# Patient Record
Sex: Female | Born: 1950 | Race: Black or African American | Hispanic: No | Marital: Single | Smoking: Never smoker
Health system: Southern US, Community
[De-identification: ages and names within clinical notes are randomized; demographics above are authoritative.]

## PROBLEM LIST (undated history)

## (undated) DIAGNOSIS — Z8679 Personal history of other diseases of the circulatory system: Secondary | ICD-10-CM

## (undated) DIAGNOSIS — F329 Major depressive disorder, single episode, unspecified: Secondary | ICD-10-CM

## (undated) DIAGNOSIS — M109 Gout, unspecified: Secondary | ICD-10-CM

## (undated) DIAGNOSIS — D121 Benign neoplasm of appendix: Secondary | ICD-10-CM

## (undated) DIAGNOSIS — I10 Essential (primary) hypertension: Secondary | ICD-10-CM

## (undated) DIAGNOSIS — L299 Pruritus, unspecified: Secondary | ICD-10-CM

## (undated) DIAGNOSIS — T7840XA Allergy, unspecified, initial encounter: Secondary | ICD-10-CM

## (undated) DIAGNOSIS — K633 Ulcer of intestine: Secondary | ICD-10-CM

## (undated) DIAGNOSIS — K2270 Barrett's esophagus without dysplasia: Secondary | ICD-10-CM

## (undated) DIAGNOSIS — F32A Depression, unspecified: Secondary | ICD-10-CM

## (undated) DIAGNOSIS — E119 Type 2 diabetes mellitus without complications: Secondary | ICD-10-CM

## (undated) DIAGNOSIS — F411 Generalized anxiety disorder: Secondary | ICD-10-CM

## (undated) DIAGNOSIS — M1990 Unspecified osteoarthritis, unspecified site: Secondary | ICD-10-CM

## (undated) DIAGNOSIS — F489 Nonpsychotic mental disorder, unspecified: Secondary | ICD-10-CM

## (undated) DIAGNOSIS — R519 Headache, unspecified: Secondary | ICD-10-CM

## (undated) DIAGNOSIS — Z5189 Encounter for other specified aftercare: Secondary | ICD-10-CM

## (undated) DIAGNOSIS — K589 Irritable bowel syndrome without diarrhea: Secondary | ICD-10-CM

## (undated) DIAGNOSIS — K219 Gastro-esophageal reflux disease without esophagitis: Secondary | ICD-10-CM

## (undated) DIAGNOSIS — F988 Other specified behavioral and emotional disorders with onset usually occurring in childhood and adolescence: Secondary | ICD-10-CM

## (undated) DIAGNOSIS — IMO0001 Reserved for inherently not codable concepts without codable children: Secondary | ICD-10-CM

## (undated) DIAGNOSIS — K5730 Diverticulosis of large intestine without perforation or abscess without bleeding: Secondary | ICD-10-CM

## (undated) DIAGNOSIS — M654 Radial styloid tenosynovitis [de Quervain]: Secondary | ICD-10-CM

## (undated) DIAGNOSIS — J309 Allergic rhinitis, unspecified: Secondary | ICD-10-CM

## (undated) DIAGNOSIS — M797 Fibromyalgia: Secondary | ICD-10-CM

## (undated) DIAGNOSIS — G894 Chronic pain syndrome: Secondary | ICD-10-CM

## (undated) DIAGNOSIS — K449 Diaphragmatic hernia without obstruction or gangrene: Secondary | ICD-10-CM

## (undated) DIAGNOSIS — E7800 Pure hypercholesterolemia, unspecified: Secondary | ICD-10-CM

## (undated) HISTORY — PX: ANKLE SURGERY: SHX546

## (undated) HISTORY — PX: CARPAL TUNNEL RELEASE: SHX101

## (undated) HISTORY — DX: Pure hypercholesterolemia, unspecified: E78.00

## (undated) HISTORY — DX: Fibromyalgia: M79.7

## (undated) HISTORY — DX: Essential (primary) hypertension: I10

## (undated) HISTORY — DX: Personal history of other diseases of the circulatory system: Z86.79

## (undated) HISTORY — PX: ESOPHAGOGASTRODUODENOSCOPY: SHX1529

## (undated) HISTORY — DX: Chronic pain syndrome: G89.4

## (undated) HISTORY — DX: Depression, unspecified: F32.A

## (undated) HISTORY — DX: Nonpsychotic mental disorder, unspecified: F48.9

## (undated) HISTORY — DX: Diaphragmatic hernia without obstruction or gangrene: K44.9

## (undated) HISTORY — DX: Unspecified osteoarthritis, unspecified site: M19.90

## (undated) HISTORY — DX: Reserved for inherently not codable concepts without codable children: IMO0001

## (undated) HISTORY — DX: Benign neoplasm of appendix: D12.1

## (undated) HISTORY — PX: SALPINGOOPHORECTOMY: SHX82

## (undated) HISTORY — DX: Radial styloid tenosynovitis (de quervain): M65.4

## (undated) HISTORY — DX: Generalized anxiety disorder: F41.1

## (undated) HISTORY — DX: Major depressive disorder, single episode, unspecified: F32.9

## (undated) HISTORY — DX: Gastro-esophageal reflux disease without esophagitis: K21.9

## (undated) HISTORY — PX: CERVICAL SPINE SURGERY: SHX589

## (undated) HISTORY — PX: ROTATOR CUFF REPAIR: SHX139

## (undated) HISTORY — DX: Allergy, unspecified, initial encounter: T78.40XA

## (undated) HISTORY — DX: Other specified behavioral and emotional disorders with onset usually occurring in childhood and adolescence: F98.8

## (undated) HISTORY — DX: Gout, unspecified: M10.9

## (undated) HISTORY — DX: Allergic rhinitis, unspecified: J30.9

## (undated) HISTORY — DX: Headache, unspecified: R51.9

## (undated) HISTORY — DX: Diverticulosis of large intestine without perforation or abscess without bleeding: K57.30

## (undated) HISTORY — DX: Barrett's esophagus without dysplasia: K22.70

## (undated) HISTORY — DX: Irritable bowel syndrome, unspecified: K58.9

## (undated) HISTORY — PX: TOTAL SHOULDER ARTHROPLASTY: SHX126

## (undated) HISTORY — DX: Ulcer of intestine: K63.3

## (undated) HISTORY — DX: Pruritus, unspecified: L29.9

## (undated) HISTORY — DX: Type 2 diabetes mellitus without complications: E11.9

## (undated) HISTORY — PX: APPENDECTOMY: SHX54

## (undated) HISTORY — PX: COLONOSCOPY: SHX174

## (undated) HISTORY — DX: Encounter for other specified aftercare: Z51.89

## (undated) HISTORY — PX: UPPER GASTROINTESTINAL ENDOSCOPY: SHX188

---

## 1997-07-28 ENCOUNTER — Ambulatory Visit (HOSPITAL_COMMUNITY): Admission: RE | Admit: 1997-07-28 | Discharge: 1997-07-28 | Payer: Self-pay | Admitting: Obstetrics and Gynecology

## 1998-02-10 ENCOUNTER — Ambulatory Visit (HOSPITAL_COMMUNITY): Admission: RE | Admit: 1998-02-10 | Discharge: 1998-02-10 | Payer: Self-pay | Admitting: Pulmonary Disease

## 1998-02-10 ENCOUNTER — Encounter: Payer: Self-pay | Admitting: Pulmonary Disease

## 1998-08-12 ENCOUNTER — Other Ambulatory Visit: Admission: RE | Admit: 1998-08-12 | Discharge: 1998-08-12 | Payer: Self-pay | Admitting: Obstetrics and Gynecology

## 1998-12-15 ENCOUNTER — Ambulatory Visit (HOSPITAL_COMMUNITY): Admission: RE | Admit: 1998-12-15 | Discharge: 1998-12-15 | Payer: Self-pay | Admitting: Obstetrics and Gynecology

## 1998-12-15 ENCOUNTER — Encounter: Payer: Self-pay | Admitting: Obstetrics and Gynecology

## 1998-12-21 ENCOUNTER — Encounter: Admission: RE | Admit: 1998-12-21 | Discharge: 1998-12-21 | Payer: Self-pay | Admitting: Specialist

## 1998-12-21 ENCOUNTER — Encounter: Payer: Self-pay | Admitting: Specialist

## 1999-11-22 ENCOUNTER — Other Ambulatory Visit: Admission: RE | Admit: 1999-11-22 | Discharge: 1999-11-22 | Payer: Self-pay | Admitting: Obstetrics and Gynecology

## 1999-12-17 ENCOUNTER — Encounter: Payer: Self-pay | Admitting: Obstetrics and Gynecology

## 1999-12-17 ENCOUNTER — Ambulatory Visit (HOSPITAL_COMMUNITY): Admission: RE | Admit: 1999-12-17 | Discharge: 1999-12-17 | Payer: Self-pay | Admitting: Obstetrics and Gynecology

## 2000-03-13 ENCOUNTER — Encounter: Payer: Self-pay | Admitting: Pulmonary Disease

## 2000-03-13 ENCOUNTER — Ambulatory Visit (HOSPITAL_COMMUNITY): Admission: RE | Admit: 2000-03-13 | Discharge: 2000-03-13 | Payer: Self-pay | Admitting: Pulmonary Disease

## 2000-06-08 ENCOUNTER — Encounter: Payer: Self-pay | Admitting: Specialist

## 2000-06-08 ENCOUNTER — Encounter: Admission: RE | Admit: 2000-06-08 | Discharge: 2000-06-08 | Payer: Self-pay | Admitting: Specialist

## 2000-12-08 ENCOUNTER — Other Ambulatory Visit: Admission: RE | Admit: 2000-12-08 | Discharge: 2000-12-08 | Payer: Self-pay | Admitting: Obstetrics and Gynecology

## 2000-12-19 ENCOUNTER — Ambulatory Visit (HOSPITAL_COMMUNITY): Admission: RE | Admit: 2000-12-19 | Discharge: 2000-12-19 | Payer: Self-pay | Admitting: Obstetrics and Gynecology

## 2000-12-19 ENCOUNTER — Encounter: Payer: Self-pay | Admitting: Obstetrics and Gynecology

## 2000-12-27 ENCOUNTER — Ambulatory Visit (HOSPITAL_COMMUNITY): Admission: RE | Admit: 2000-12-27 | Discharge: 2000-12-27 | Payer: Self-pay | Admitting: Pulmonary Disease

## 2000-12-27 ENCOUNTER — Encounter: Payer: Self-pay | Admitting: Pulmonary Disease

## 2001-01-09 ENCOUNTER — Encounter (INDEPENDENT_AMBULATORY_CARE_PROVIDER_SITE_OTHER): Payer: Self-pay | Admitting: Gastroenterology

## 2001-01-09 DIAGNOSIS — K5730 Diverticulosis of large intestine without perforation or abscess without bleeding: Secondary | ICD-10-CM

## 2001-01-11 ENCOUNTER — Encounter: Payer: Self-pay | Admitting: Gastroenterology

## 2001-01-11 ENCOUNTER — Ambulatory Visit (HOSPITAL_COMMUNITY): Admission: RE | Admit: 2001-01-11 | Discharge: 2001-01-11 | Payer: Self-pay | Admitting: Gastroenterology

## 2001-07-11 ENCOUNTER — Ambulatory Visit (HOSPITAL_COMMUNITY): Admission: RE | Admit: 2001-07-11 | Discharge: 2001-07-11 | Payer: Self-pay | Admitting: Gastroenterology

## 2001-07-11 ENCOUNTER — Encounter: Payer: Self-pay | Admitting: Gastroenterology

## 2002-01-02 ENCOUNTER — Other Ambulatory Visit: Admission: RE | Admit: 2002-01-02 | Discharge: 2002-01-02 | Payer: Self-pay | Admitting: Obstetrics and Gynecology

## 2002-01-13 ENCOUNTER — Encounter: Payer: Self-pay | Admitting: Orthopedic Surgery

## 2002-01-13 ENCOUNTER — Ambulatory Visit (HOSPITAL_COMMUNITY): Admission: RE | Admit: 2002-01-13 | Discharge: 2002-01-13 | Payer: Self-pay | Admitting: Orthopedic Surgery

## 2002-10-29 ENCOUNTER — Encounter: Payer: Self-pay | Admitting: Gastroenterology

## 2002-10-29 ENCOUNTER — Ambulatory Visit (HOSPITAL_COMMUNITY): Admission: RE | Admit: 2002-10-29 | Discharge: 2002-10-29 | Payer: Self-pay | Admitting: Gastroenterology

## 2002-11-01 ENCOUNTER — Ambulatory Visit (HOSPITAL_COMMUNITY): Admission: RE | Admit: 2002-11-01 | Discharge: 2002-11-01 | Payer: Self-pay | Admitting: Gastroenterology

## 2002-11-01 ENCOUNTER — Encounter: Payer: Self-pay | Admitting: Gastroenterology

## 2003-01-21 ENCOUNTER — Other Ambulatory Visit: Admission: RE | Admit: 2003-01-21 | Discharge: 2003-01-21 | Payer: Self-pay | Admitting: Obstetrics and Gynecology

## 2004-01-26 ENCOUNTER — Other Ambulatory Visit: Admission: RE | Admit: 2004-01-26 | Discharge: 2004-01-26 | Payer: Self-pay | Admitting: Obstetrics and Gynecology

## 2004-02-04 ENCOUNTER — Ambulatory Visit: Payer: Self-pay | Admitting: Pulmonary Disease

## 2004-03-04 ENCOUNTER — Ambulatory Visit: Payer: Self-pay | Admitting: Pulmonary Disease

## 2004-03-30 ENCOUNTER — Ambulatory Visit: Payer: Self-pay | Admitting: Pulmonary Disease

## 2005-03-07 ENCOUNTER — Ambulatory Visit: Payer: Self-pay | Admitting: Pulmonary Disease

## 2005-07-08 ENCOUNTER — Ambulatory Visit: Payer: Self-pay | Admitting: Pulmonary Disease

## 2006-01-13 ENCOUNTER — Other Ambulatory Visit: Admission: RE | Admit: 2006-01-13 | Discharge: 2006-01-13 | Payer: Self-pay | Admitting: Obstetrics and Gynecology

## 2006-01-24 ENCOUNTER — Ambulatory Visit: Payer: Self-pay | Admitting: Pulmonary Disease

## 2006-09-18 ENCOUNTER — Ambulatory Visit: Payer: Self-pay | Admitting: Pulmonary Disease

## 2006-10-26 ENCOUNTER — Ambulatory Visit: Payer: Self-pay | Admitting: Internal Medicine

## 2006-11-09 ENCOUNTER — Encounter: Payer: Self-pay | Admitting: Internal Medicine

## 2006-11-09 ENCOUNTER — Ambulatory Visit: Payer: Self-pay | Admitting: Internal Medicine

## 2007-01-10 ENCOUNTER — Ambulatory Visit: Payer: Self-pay | Admitting: Internal Medicine

## 2007-03-08 ENCOUNTER — Encounter: Payer: Self-pay | Admitting: Pulmonary Disease

## 2007-04-20 DIAGNOSIS — K589 Irritable bowel syndrome without diarrhea: Secondary | ICD-10-CM

## 2007-04-20 DIAGNOSIS — I10 Essential (primary) hypertension: Secondary | ICD-10-CM | POA: Insufficient documentation

## 2007-04-20 DIAGNOSIS — M19041 Primary osteoarthritis, right hand: Secondary | ICD-10-CM

## 2007-04-20 DIAGNOSIS — M654 Radial styloid tenosynovitis [de Quervain]: Secondary | ICD-10-CM

## 2007-04-20 DIAGNOSIS — M797 Fibromyalgia: Secondary | ICD-10-CM

## 2007-04-20 DIAGNOSIS — M19042 Primary osteoarthritis, left hand: Secondary | ICD-10-CM

## 2007-04-20 DIAGNOSIS — F988 Other specified behavioral and emotional disorders with onset usually occurring in childhood and adolescence: Secondary | ICD-10-CM | POA: Insufficient documentation

## 2007-04-20 DIAGNOSIS — F411 Generalized anxiety disorder: Secondary | ICD-10-CM | POA: Insufficient documentation

## 2007-04-20 DIAGNOSIS — K219 Gastro-esophageal reflux disease without esophagitis: Secondary | ICD-10-CM

## 2007-07-19 ENCOUNTER — Encounter: Payer: Self-pay | Admitting: Pulmonary Disease

## 2007-10-03 ENCOUNTER — Encounter: Payer: Self-pay | Admitting: Pulmonary Disease

## 2007-10-03 DIAGNOSIS — E7800 Pure hypercholesterolemia, unspecified: Secondary | ICD-10-CM

## 2007-10-03 DIAGNOSIS — Z8679 Personal history of other diseases of the circulatory system: Secondary | ICD-10-CM | POA: Insufficient documentation

## 2007-10-03 DIAGNOSIS — E785 Hyperlipidemia, unspecified: Secondary | ICD-10-CM | POA: Insufficient documentation

## 2007-10-09 ENCOUNTER — Telehealth (INDEPENDENT_AMBULATORY_CARE_PROVIDER_SITE_OTHER): Payer: Self-pay | Admitting: *Deleted

## 2007-10-18 ENCOUNTER — Ambulatory Visit: Payer: Self-pay | Admitting: Pulmonary Disease

## 2007-10-19 ENCOUNTER — Telehealth (INDEPENDENT_AMBULATORY_CARE_PROVIDER_SITE_OTHER): Payer: Self-pay | Admitting: *Deleted

## 2007-10-19 ENCOUNTER — Encounter: Payer: Self-pay | Admitting: Pulmonary Disease

## 2007-10-20 DIAGNOSIS — J309 Allergic rhinitis, unspecified: Secondary | ICD-10-CM | POA: Insufficient documentation

## 2007-10-20 DIAGNOSIS — G894 Chronic pain syndrome: Secondary | ICD-10-CM

## 2007-10-20 DIAGNOSIS — F329 Major depressive disorder, single episode, unspecified: Secondary | ICD-10-CM | POA: Insufficient documentation

## 2007-11-22 ENCOUNTER — Encounter: Payer: Self-pay | Admitting: Pulmonary Disease

## 2007-11-26 ENCOUNTER — Ambulatory Visit: Payer: Self-pay | Admitting: Internal Medicine

## 2007-11-29 ENCOUNTER — Encounter: Payer: Self-pay | Admitting: Internal Medicine

## 2007-11-29 ENCOUNTER — Ambulatory Visit: Payer: Self-pay | Admitting: Internal Medicine

## 2007-12-26 ENCOUNTER — Encounter: Payer: Self-pay | Admitting: Pulmonary Disease

## 2008-01-08 ENCOUNTER — Telehealth (INDEPENDENT_AMBULATORY_CARE_PROVIDER_SITE_OTHER): Payer: Self-pay | Admitting: *Deleted

## 2008-04-15 ENCOUNTER — Ambulatory Visit: Payer: Self-pay | Admitting: Pulmonary Disease

## 2008-04-17 ENCOUNTER — Ambulatory Visit: Payer: Self-pay | Admitting: Pulmonary Disease

## 2008-04-20 LAB — CONVERTED CEMR LAB
ALT: 37 units/L — ABNORMAL HIGH (ref 0–35)
AST: 25 units/L (ref 0–37)
Albumin: 4.1 g/dL (ref 3.5–5.2)
Alkaline Phosphatase: 84 units/L (ref 39–117)
BUN: 14 mg/dL (ref 6–23)
Basophils Absolute: 0 10*3/uL (ref 0.0–0.1)
Basophils Relative: 0.6 % (ref 0.0–3.0)
Bilirubin, Direct: 0.1 mg/dL (ref 0.0–0.3)
CO2: 30 meq/L (ref 19–32)
Calcium: 9.6 mg/dL (ref 8.4–10.5)
Chloride: 104 meq/L (ref 96–112)
Cholesterol: 221 mg/dL (ref 0–200)
Creatinine, Ser: 0.8 mg/dL (ref 0.4–1.2)
Direct LDL: 130.1 mg/dL
Eosinophils Absolute: 0.2 10*3/uL (ref 0.0–0.7)
Eosinophils Relative: 2.5 % (ref 0.0–5.0)
GFR calc Af Amer: 94 mL/min
GFR calc non Af Amer: 78 mL/min
Glucose, Bld: 118 mg/dL — ABNORMAL HIGH (ref 70–99)
HCT: 37.7 % (ref 36.0–46.0)
HDL: 37.2 mg/dL — ABNORMAL LOW (ref >39.0)
Hemoglobin: 12.9 g/dL (ref 12.0–15.0)
Lymphocytes Relative: 38.3 % (ref 12.0–46.0)
MCHC: 34.2 g/dL (ref 30.0–36.0)
MCV: 92.1 fL (ref 78.0–100.0)
Monocytes Absolute: 0.4 10*3/uL (ref 0.1–1.0)
Monocytes Relative: 6.3 % (ref 3.0–12.0)
Neutro Abs: 3.2 10*3/uL (ref 1.4–7.7)
Neutrophils Relative %: 52.3 % (ref 43.0–77.0)
Platelets: 270 10*3/uL (ref 150–400)
Potassium: 3.6 meq/L (ref 3.5–5.1)
RBC: 4.09 M/uL (ref 3.87–5.11)
RDW: 13.5 % (ref 11.5–14.6)
Sed Rate: 19 mm/hr (ref 0–22)
Sodium: 142 meq/L (ref 135–145)
TSH: 3.26 microintl units/mL (ref 0.35–5.50)
Total Bilirubin: 0.7 mg/dL (ref 0.3–1.2)
Total CHOL/HDL Ratio: 5.9
Total Protein: 7.2 g/dL (ref 6.0–8.3)
Triglycerides: 194 mg/dL — ABNORMAL HIGH (ref 0–149)
VLDL: 39 mg/dL (ref 0–40)
WBC: 6.2 10*3/uL (ref 4.5–10.5)

## 2008-04-21 LAB — CONVERTED CEMR LAB: Vit D, 25-Hydroxy: 17 ng/mL — ABNORMAL LOW (ref 30–89)

## 2008-05-29 ENCOUNTER — Telehealth (INDEPENDENT_AMBULATORY_CARE_PROVIDER_SITE_OTHER): Payer: Self-pay | Admitting: *Deleted

## 2008-05-29 ENCOUNTER — Encounter: Payer: Self-pay | Admitting: Pulmonary Disease

## 2008-10-16 ENCOUNTER — Encounter: Payer: Self-pay | Admitting: Pulmonary Disease

## 2008-12-30 ENCOUNTER — Encounter: Payer: Self-pay | Admitting: Pulmonary Disease

## 2009-01-06 ENCOUNTER — Other Ambulatory Visit: Admission: RE | Admit: 2009-01-06 | Discharge: 2009-01-06 | Payer: Self-pay | Admitting: Obstetrics and Gynecology

## 2009-01-06 ENCOUNTER — Ambulatory Visit: Payer: Self-pay | Admitting: Obstetrics and Gynecology

## 2009-01-06 ENCOUNTER — Encounter: Payer: Self-pay | Admitting: Obstetrics and Gynecology

## 2009-01-08 ENCOUNTER — Ambulatory Visit: Payer: Self-pay | Admitting: Pulmonary Disease

## 2009-01-15 ENCOUNTER — Encounter: Admission: RE | Admit: 2009-01-15 | Discharge: 2009-01-15 | Payer: Self-pay | Admitting: Obstetrics and Gynecology

## 2009-01-23 ENCOUNTER — Telehealth (INDEPENDENT_AMBULATORY_CARE_PROVIDER_SITE_OTHER): Payer: Self-pay | Admitting: *Deleted

## 2009-04-23 ENCOUNTER — Encounter: Payer: Self-pay | Admitting: Pulmonary Disease

## 2009-07-08 ENCOUNTER — Ambulatory Visit: Payer: Self-pay | Admitting: Pulmonary Disease

## 2009-07-09 ENCOUNTER — Ambulatory Visit: Payer: Self-pay | Admitting: Pulmonary Disease

## 2009-07-09 LAB — CONVERTED CEMR LAB
AST: 31 units/L (ref 0–37)
Albumin: 4 g/dL (ref 3.5–5.2)
Alkaline Phosphatase: 72 units/L (ref 39–117)
BUN: 13 mg/dL (ref 6–23)
Basophils Relative: 0.4 % (ref 0.0–3.0)
Chloride: 104 meq/L (ref 96–112)
Cholesterol: 220 mg/dL — ABNORMAL HIGH (ref 0–200)
Eosinophils Absolute: 0.1 10*3/uL (ref 0.0–0.7)
GFR calc non Af Amer: 96.61 mL/min (ref >60)
Lymphs Abs: 1.8 10*3/uL (ref 0.7–4.0)
MCHC: 33.2 g/dL (ref 30.0–36.0)
Monocytes Absolute: 0.3 10*3/uL (ref 0.1–1.0)
Neutro Abs: 3.6 10*3/uL (ref 1.4–7.7)
Neutrophils Relative %: 61.3 % (ref 43.0–77.0)
Potassium: 4.4 meq/L (ref 3.5–5.1)
Total Protein: 7 g/dL (ref 6.0–8.3)
Triglycerides: 192 mg/dL — ABNORMAL HIGH (ref 0.0–149.0)
VLDL: 38.4 mg/dL (ref 0.0–40.0)
WBC: 5.8 10*3/uL (ref 4.5–10.5)

## 2009-09-22 ENCOUNTER — Telehealth: Payer: Self-pay | Admitting: Pulmonary Disease

## 2009-10-28 ENCOUNTER — Telehealth: Payer: Self-pay | Admitting: Pulmonary Disease

## 2010-01-06 ENCOUNTER — Ambulatory Visit: Payer: Self-pay | Admitting: Pulmonary Disease

## 2010-01-18 ENCOUNTER — Ambulatory Visit (HOSPITAL_COMMUNITY): Admission: RE | Admit: 2010-01-18 | Discharge: 2010-01-18 | Payer: Self-pay | Admitting: Obstetrics and Gynecology

## 2010-03-30 NOTE — Assessment & Plan Note (Signed)
Summary: 6 month return/mhh   Primary Care Provider:  Lorin Picket Nadel,MD  CC:  6 month ROV & review of mult medical problems....  History of Present Illness: 60 y/o BF here for a follow up visit... she has multiple medical problems as noted below...    ~  we have prev filled out disability papers at her request-  she is disabled due to multiple medical problems and DJD/ Fibromyalgia/ Chronic Pain syndrome/ and Psychiatric issues... these problems have been evaluated and are followed by mult other practitioners including: DrDeveshwar (Rheum), DrNitka (Ortho), DrHirsh (Neurosurg), and [facility] ([facility])...   ~  Jul 09, 2009:  Stable overall>  saw DrDeveshwar 2/11 for f/u FM, generalized pain, hx gout, right knee problems> on Allopurinol, Colcrys, Vicodin, Robaxin, Neurontin (they signed narcotic contract)... she also f/u w/ Psychiatry on Wellbutrin, Cymbalta, Seroquel... BP controlled as below;  Chol is fair but she doesn't want statin Rx;  GI stable w/ acid reflux & IBS... recent labs reviewed w/ pt.   ~  January 06, 2010:  she's had a reasonably good 22mo> but continues w/ her chronic problems including aching/ sore from the [facility] etc & she continues to f/u w/ DrDeveshwar for Rheum- meds unchanged as below... she also maintains regular f/u w/ LisaPoulas/ Psychiatry- maintained on the same 3 meds as noted... from the medical standpoint> BP controlled, remains on diet for chol etc, & GI system is stable on her Prn meds as listed... OK Flu vaccine today> we discussed continue current regimen & f/u OV w/ fasting labs in 6mo.    Current Problem List:  ALLERGIC RHINITIS (ICD-477.9) - she uses OTC antihistamines, mucinex, saline and FLONASE for her sinuses...  HYPERTENSION (ICD-401.9) - controlled on TOPROL XL 100/d,  AMLODIPINE 10mg /d,  DIOVAN 320mg /d... BP= 116/82 and feeling OK, tolerating meds well... denies HA, visual changes, CP, palipit, syncope, dyspnea, edema, etc...  MITRAL  VALVE PROLAPSE, HX OF (ICD-V12.50) - notes some intermittent sharp CP & palpit...  HYPERCHOLESTEROLEMIA (ICD-272.0) - on diet + OMEGA-3 Fish Oil... she has not been interested in statin Rx...  ~  FLP 10/06 by her GYN showed TChol 254, TG 364, HDL 38, LDL 143  ~  FLP 2/10 showed TChol 221, TG 194, HDL 37, LDL 130... rec> better diet, she refuses statin meds.  ~  FLP 5/11 showed TChol 220, TG 192, HDL 40, LDL 133  GERD (ICD-530.81) & ? of BARRETTS ESOPHAGUS (ICD-530.85) - long hx of GERD symptoms and followed by DrGessner for GI... she takes OMEPRAZOLE 20mg /d + GasX etc... last EGD 9/08 showed sm HH and ?1cm segment of Barrett's but bx was neg... therefore routine f/u suggested...   DIVERTICULOSIS, COLON (ICD-562.10) & IRRITABLE BOWEL SYNDROME (ICD-564.1) - she takes DICYCLOMINE 20mg Tid from [employer]...   ~  colonoscopy 11/02 showed mild divertics, otherw neg...   ~  10/09:  f/u GI eval by DrGessner due to symptoms of pain, diarrhea, bloating> colonoscopy was normal & bx= neg, therefore felt to all be c/w IBS...  OSTEOARTHRITIS (ICD-715.90) GOUT (ICD-274.9) FIBROMYALGIA (ICD-729.1) DE QUERVAIN'S TENOSYNOVITIS (ICD-727.04) CHRONIC PAIN SYNDROME (ICD-338.4) - as noted above>  these problems are followed by various specialists including:  DrDeveshwar (Rheum), DrNitka (Ortho), DrHirsh (Neurosurg)...  she is currently taking VICODIN Prn,  ROBAXIN 500mg Tid (uses Prn),  FLEXERIL 10mg - 1-2 Qhs, ALLOPURINOL 300mg - 1.5 tabs daily,  COLCRYS 0.6mg Bid Prn, & NEURONTIN 300mg Tid (she takes Prn)...  ~  11/10:  she signed a narcotic contract w/ DrDeveshwar 8/10...  ~  2/11:  note from DrDeveshwar reviewed & pain med Rx noted...  ATTENTION DEFICIT DISORDER (ICD-314.00) PSYCHIATRIC DISORDER (ICD-300.9) ANXIETY (ICD-300.00) - as noted above>  these problems are followed and treated by [facility])... we do not have notes from these doctors but the pt states that she is prev being treated  w/ Adderall, WellbutrinXR, Cymbalta, Seroquel, and Rozerem...  ~  2/10:  they make freq adjustments & she indicates that she is off the Adderall & Rozerum now...  ~  11/10: she indicates stable on the Wellbutrin, Cymbalta, Seroquel...  ~  5/11:  pt indicates that she is stable on WELLBUTRIN XL 300mg /d,  CYMBALTA 60mg Bid, SEROQUEL 300mg - 2Qhs...  PRURITUS (ICD-698.9) - she saw DrWoods w/ Hydroxyzine perscribed...   Preventive Screening-Counseling & Management  Alcohol-Tobacco     Smoking Status: never  Caffeine-Diet-Exercise     Does Patient Exercise: yes  Allergies (verified): No Known Drug Allergies  Comments:  Nurse/Medical Assistant: The patient's medications and allergies were reviewed with the patient and were updated in the Medication and Allergy Lists.  Past History:  Past Medical History: ALLERGIC RHINITIS (ICD-477.9) HYPERTENSION (ICD-401.9) MITRAL VALVE PROLAPSE, HX OF (ICD-V12.50) HYPERCHOLESTEROLEMIA (ICD-272.0) GERD (ICD-530.81) ? of BARRETTS ESOPHAGUS (ICD-530.85) DIVERTICULOSIS, COLON (ICD-562.10) IRRITABLE BOWEL SYNDROME (ICD-564.1) OSTEOARTHRITIS (ICD-715.90) GOUT (ICD-274.9) FIBROMYALGIA (ICD-729.1) DE QUERVAIN'S TENOSYNOVITIS (ICD-727.04) CHRONIC PAIN SYNDROME (ICD-338.4) ATTENTION DEFICIT DISORDER (ICD-314.00) PSYCHIATRIC DISORDER (ICD-300.9) ANXIETY (ICD-300.00) PRURITUS (ICD-698.9)  Past Surgical History: Rt ankle Lt Carpal Tunnel Release Lt Rotator Cuff C-Spine  Family History: Reviewed history from 11/26/2007 and no changes required. No FH of Colon Cancer  Social History: Reviewed history from 11/26/2007 and no changes required. Occupation: Retired Patient has never smoked.  Alcohol Use - no Illicit Drug Use - no Patient gets regular exercise  Review of Systems      See HPI       The patient complains of dyspnea on exertion.  The patient denies anorexia, fever, weight loss, weight gain, vision loss, decreased hearing,  hoarseness, chest pain, syncope, peripheral edema, prolonged cough, headaches, hemoptysis, abdominal pain, melena, hematochezia, severe indigestion/heartburn, hematuria, incontinence, muscle weakness, suspicious skin lesions, transient blindness, difficulty walking, depression, unusual weight change, abnormal bleeding, enlarged lymph nodes, and angioedema.    Vital Signs:  Patient profile:   60 year old female Height:      65 inches Weight:      180.50 pounds BMI:     30.15 O2 Sat:      100 % on Room air Temp:     98.5 degrees F oral Pulse rate:   98 / minute BP sitting:   116 / 82  (left arm) Cuff size:   regular  Vitals Entered By: Randell Loop CMA (January 06, 2010 2:07 PM)  O2 Sat at Rest %:  100 O2 Flow:  Room air CC: 6 month ROV & review of mult medical problems... Is Patient Diabetic? No Pain Assessment Patient in pain? yes      Onset of pain  from the FM Comments meds updated today with pt   Physical Exam  Additional Exam:  WD, WN, 59 y/o BF in NAD... GENERAL:  Alert & oriented; pleasant & cooperative...  HEENT:  [place]/AT, EOM-wnl, PERRLA, EACs-clear, TMs-wnl, NOSE-clear, THROAT-clear & wnl. NECK:  Supple w/ fairROM; no JVD; normal carotid impulses w/o bruits; no thyromegaly or nodules palpated; no lymphadenopathy. CHEST:  Clear to P & A; without wheezes/ rales/ or rhonchi heard... HEART:  Regular Rhythm; without murmurs/ rubs/ or gallops  detected...  ABDOMEN:  Soft & nontender; normal bowel sounds; no organomegaly or masses palpated... EXT: without deformities, mult bilat trigger points, mild arthritic changes; no varicose veins/ +venous insuffic/ tr edema. NEURO:  CN's intact; motor testing normal; sensory testing normal; gait normal & balance OK. DERM:  No lesions noted; no rash etc...    Impression & Recommendations:  Problem # 1:  ALLERGIC RHINITIS (ICD-477.9) we discussed regimen including Zyrtek, Saline, Mucinex, Flonase... Her updated medication list for  this problem includes:    Fluticasone Propionate 50 Mcg/act Susp (Fluticasone propionate) .Marland Kitchen... 2 sp in each nostril at bedtime...  Problem # 2:  HYPERTENSION (ICD-401.9) Controlled on med>  continue same. Her updated medication list for this problem includes:    Toprol Xl 100 Mg Xr24h-tab (Metoprolol succinate) .Marland Kitchen... Take 1 tablet by mouth once a day    Amlodipine Besylate 10 Mg Tabs (Amlodipine besylate) .Marland Kitchen... Take 1 tablet by mouth once a day    Losartan Potassium 100 Mg Tabs (Losartan potassium) .Marland Kitchen... Take 1 tablet by mouth once a day  Problem # 3:  HYPERCHOLESTEROLEMIA (ICD-272.0) Stable on diet + Fish Oil...  Problem # 4:  GERD (ICD-530.81) Stable on PPI Rx daily... Her updated medication list for this problem includes:    Omeprazole 20 Mg Cpdr (Omeprazole) .Marland Kitchen... Take 1 tablet by mouth once a day    Dicyclomine Hcl 20 Mg Tabs (Dicyclomine hcl) .Marland Kitchen... Take 1 tab by mouth three times a day as needed abd cramping...  Problem # 5:  IRRITABLE BOWEL SYNDROME (ICD-564.1) Symptoms improved w/ diet adjust + Bentyl as needed...  Problem # 6:  ORTHO>>> Followed by drDeveshwar as noted...  Problem # 7:  PSYCHIATRIC DISORDER (ICD-300.9) Followed by Lindaann Slough as noted...  Problem # 8:  OTHER MEDICAL PROBLEMS AS NOTED>>> OK Flu vaccine today...  Complete Medication List: 1)  Fluticasone Propionate 50 Mcg/act Susp (Fluticasone propionate) .... 2 sp in each nostril at bedtime.Marland KitchenMarland Kitchen 2)  Toprol Xl 100 Mg Xr24h-tab (Metoprolol succinate) .... Take 1 tablet by mouth once a day 3)  Amlodipine Besylate 10 Mg Tabs (Amlodipine besylate) .... Take 1 tablet by mouth once a day 4)  Losartan Potassium 100 Mg Tabs (Losartan potassium) .... Take 1 tablet by mouth once a day 5)  Fish Oil 1000 Mg Caps (Omega-3 fatty acids) .... Take one tablet by mouth once daily 6)  Omeprazole 20 Mg Cpdr (Omeprazole) .... Take 1 tablet by mouth once a day 7)  Dicyclomine Hcl 20 Mg Tabs (Dicyclomine hcl) .... Take 1 tab by  mouth three times a day as needed abd cramping... 8)  Allopurinol 300 Mg Tabs (Allopurinol) .... Take 1 1/2  tab by mouth once daily.Marland KitchenMarland Kitchen 9)  Colcrys 0.6 Mg Tabs (Colchicine) .... Take as directed by drdeveshwar... 10)  Vicodin 5-500 Mg Tabs (Hydrocodone-acetaminophen) .... Take as directed by drdeveshwar... 11)  Neurontin 300 Mg Caps (Gabapentin) .... Take one tablet by mouth three times a day as needed for pain 12)  Robaxin 500 Mg Tabs (Methocarbamol) .... Take 1 tab by mouth three times a day as needed for muscle spasm.... 13)  Flexeril 10 Mg Tabs (Cyclobenzaprine hcl) .... Take 1/2 to 1 tablet by mouth at bedtime as needed 14)  Wellbutrin Xl 300 Mg Xr24h-tab (Bupropion hcl) .... Take 1 tablet by mouth once a day 15)  Cymbalta 60 Mg Cpep (Duloxetine hcl) .... Take 1 tablet by mouth two times a day 16)  Seroquel 300 Mg Tabs (Quetiapine fumarate) .... Take 2 tablets  by mouth at bedtime 17)  Vitamin D 78295 Unit Caps (Ergocalciferol) .... Take one tablet by mouth once weekly  Patient Instructions: 1)  Today we updated your med list- see below.... 2)  We changed your Nasonex to generic FLONASE- 2 sp in each nostril at bedtime.Marland KitchenMarland Kitchen 3)  Keep up the good work w/ diet & exercise... 4)  We gave you the 2011 Flu vaccine today. 5)  Call for any questions.Marland KitchenMarland Kitchen 6)  Please schedule a follow-up appointment in 6 months & we will plan to check your FASTING blood work at that time... Prescriptions: FLUTICASONE PROPIONATE 50 MCG/ACT SUSP (FLUTICASONE PROPIONATE) 2 sp in each nostril at bedtime...  #1 x 12   Entered and Authorized by:   Michele Mcalpine MD   Signed by:   Michele Mcalpine MD on 01/06/2010   Method used:   Print then Give to Patient   RxID:   [id number]    Immunization History:  Influenza Immunization History:    Influenza:  historical (12/11/2008)

## 2010-03-30 NOTE — Letter (Signed)
Summary: [facility]  [facility]   Imported By: Lester Stanberry 05/11/2009 09:49:32  _____________________________________________________________________  External Attachment:    Type:   Image     Comment:   External Document

## 2010-03-30 NOTE — Progress Notes (Signed)
Summary: disability forms  Phone Note Call from Patient Call back at Home Phone [phone]   Caller: Patient Call For: nadel Reason for Call: Talk to Nurse Summary of Call: patient brought by disability forms for SN to fill out when he gets back.  Pt aware SN not back until 09/28/2009.  Ppaers at [employer] Initial call taken by: Eugene Gavia,  September 22, 2009 1:41 PM  Follow-up for Phone Call        will have SN sign once he is back in the office.  pt is aware Randell Loop CMA  September 22, 2009 2:30 PM    disability forms have been sent to [facility] to be filled out Randell Loop [facility]  September 22, 2009 5:15 PM

## 2010-03-30 NOTE — Progress Notes (Signed)
Summary: change from diovan to losartan  Phone Note From Pharmacy   Summary of Call: fax from pharmacy that pt wanted to change her diovan to losartan.  wanted to see if ok with SN.  per SN this is ok to change the diovan 320mg  to losartan 100mg    once daily.  called and spoke with pt and she stated that she did want to change to generic due to the cost of her meds.  she is aware that this will be sent in today Randell Loop CMA  October 28, 2009 5:14 PM     New/Updated Medications: LOSARTAN POTASSIUM 100 MG TABS (LOSARTAN POTASSIUM) Take 1 tablet by mouth once a day Prescriptions: LOSARTAN POTASSIUM 100 MG TABS (LOSARTAN POTASSIUM) Take 1 tablet by mouth once a day  #30 x 6   Entered by:   Randell Loop CMA   Authorized by:   Michele Mcalpine MD   Signed by:   Randell Loop CMA on 10/28/2009   Method used:   Electronically to        [facility] [phone]* (retail)       [address] [place], [place]  [address]       Ph: [phone]       Fax: [phone]   RxID:   [id number]

## 2010-03-30 NOTE — Assessment & Plan Note (Signed)
Summary: 6 month return/mhh   Primary Care Provider:  Lorin Picket Shayana Hornstein,MD  CC:  6 month ROV & review of mult medical problems....  History of Present Illness: 60 y/o BF here for a follow up visit... she has multiple medical problems as noted below...    ~  we have prev filled out disability papers at her request-  she is disabled due to multiple medical problems and DJD/ Fibromyalgia/ Chronic Pain syndrome/ and Psychiatric issues... these problems have been evaluated and are followed by mult other practitioners including: DrDeveshwar (Rheum), DrNitka (Ortho), DrHirsh (Neurosurg), and [facility] (LisaPoulas)...   ~  Feb10:  since I saw her last in Aug09- she has had a f/u colonoscopy by Rodena Medin Oct09 due to symptoms of pain, diarrhea, bloating & the colon was normal & bx= neg, therefore felt to all be c/w IBS... she also saw DrDeveshwar for f/u of her Fibromyalgia/ DJD/ DDD, etc...  ~  Nov10:  she continues regular f/u w/ DrDeveshwar for Rheum- recently signed a narcotic contract for her pain meds & copes w/ her condition as best she can... also sees DrPoulas, Psychiatry regularly on same 3 meds- stable... her BP appears well controlled on meds & Chol being managed w/ diet alone... OK flu vaccine today.   ~  Jul 09, 2009:  Stable overall>  saw DrDeveshwar 2/11 for f/u FM, generalized pain, hx gout, right knee problems> on Allopurinol, Colcrys, Vicodin, Robaxin, Neurontin... she also f/u w/ Psychiatry on Wellbutrin, Cymbalta, Seroquel... BP controlled as below;  Chol is fair but she doesn't want statin Rx;  GI stable w/ acid reflux & IBS... recent labs reviewed w/ pt.    Current Problem List:  ALLERGIC RHINITIS (ICD-477.9) - she uses OTC antihistamines, mucinex, saline and Nasonex for her sinuses...  HYPERTENSION (ICD-401.9) - controlled on TOPROL XL 100/d,  AMLODIPINE 10mg /d,  DIOVAN 320mg /d (off prev HCTZ by DrDeveshwar)... BP= 122/78 and feeling OK, tolerating meds well... denies  HA, visual changes, CP, palipit, syncope, dyspnea, edema, etc...  MITRAL VALVE PROLAPSE, HX OF (ICD-V12.50) - notes some intermittent sharp CP & palpit...  HYPERCHOLESTEROLEMIA (ICD-272.0) - on diet + OMEGA-3 Fish Oil... she has not been interested in statin Rx...  ~  FLP 10/06 by her GYN showed TChol 254, TG 364, HDL 38, LDL 143  ~  FLP 2/10 showed TChol 221, TG 194, HDL 37, LDL 130... rec> better diet, she refuses statin meds.  ~  FLP 5/11 showed TChol 220, TG 192, HDL 40, LDL 133  GERD (ICD-530.81) & ? of BARRETTS ESOPHAGUS (ICD-530.85) - long hx of GERD symptoms and followed by DrGessner for GI... she takes OMEPRAZOLE 20mg /d + GasX etc... last EGD 9/08 showed sm HH and ?1cm segment of Barrett's but bx was neg... therefore routine f/u suggested...   DIVERTICULOSIS, COLON (ICD-562.10) & IRRITABLE BOWEL SYNDROME (ICD-564.1) - she takes DICYCLOMINE 20mg Tid from [employer]...   ~  colonoscopy 11/02 showed mild divertics, otherw neg...   ~  colonoscopy 10/09 by DrGessner was neg- normal, random bx= neg...  OSTEOARTHRITIS (ICD-715.90) GOUT (ICD-274.9) FIBROMYALGIA (ICD-729.1) DE QUERVAIN'S TENOSYNOVITIS (ICD-727.04) CHRONIC PAIN SYNDROME (ICD-338.4) - as noted above>  these problems are followed by various specialists including:  DrDeveshwar (Rheum), DrNitka (Ortho), DrHirsh (Neurosurg)...  she is currently taking VICODIN Prn,  ROBAXIN 500mg Tid (uses Prn),  ALLOPURINOL 300mg - 1.5 tabs daily,  COLCRYS 0.6mg Bid Prn & NEURONTIN 300mg Tid (she takes Prn)...  ~  11/10:  she signed a narcotic contract w/ DrDeveshwar 8/10...  ~  2/11:  note from DrDeveshwar reviewed & pain med Rx noted...  ATTENTION DEFICIT DISORDER (ICD-314.00) PSYCHIATRIC DISORDER (ICD-300.9) ANXIETY (ICD-300.00) - as noted above>  these problems are followed and treated by [facility])... we do not have notes from these doctors but the pt states that she is prev being treated w/ Adderall, WellbutrinXR,  Cymbalta, Seroquel, and Rozerem...  ~  2/10:  they make freq adjustments & she indicates that she is off the Adderall & Rozerum now...  ~  11/10: she indicates stable on the Wellbutrin, Cymbalta, Seroquel...  ~  5/11:  pt indicates that she is stable on WELLBUTRIN XL 300mg /d,  CYMBALTA 60mg Bid, SEROQUEL 300mg - 2Qhs...  PRURITUS (ICD-698.9) - recnetly saw DrWoods w/ Hydroxyzine perscribed...   Allergies (verified): No Known Drug Allergies  Comments:  Nurse/Medical Assistant: The patient's medications and allergies were reviewed with the patient and were updated in the Medication and Allergy Lists.  Past History:  Past Medical History: ALLERGIC RHINITIS (ICD-477.9) HYPERTENSION (ICD-401.9) MITRAL VALVE PROLAPSE, HX OF (ICD-V12.50) HYPERCHOLESTEROLEMIA (ICD-272.0) GERD (ICD-530.81) ? of BARRETTS ESOPHAGUS (ICD-530.85) DIVERTICULOSIS, COLON (ICD-562.10) IRRITABLE BOWEL SYNDROME (ICD-564.1) OSTEOARTHRITIS (ICD-715.90) GOUT (ICD-274.9) FIBROMYALGIA (ICD-729.1) DE QUERVAIN'S TENOSYNOVITIS (ICD-727.04) CHRONIC PAIN SYNDROME (ICD-338.4) ATTENTION DEFICIT DISORDER (ICD-314.00) PSYCHIATRIC DISORDER (ICD-300.9) ANXIETY (ICD-300.00) PRURITUS (ICD-698.9)  Past Surgical History: Rt ankle Lt Carpal Tunnel Release Lt Rotator Cuff C-Spine   Family History: Reviewed history from 11/26/2007 and no changes required. No FH of Colon Cancer:  Social History: Reviewed history from 11/26/2007 and no changes required. Occupation: Retired Patient has never smoked.  Alcohol Use - no Illicit Drug Use - no Patient gets regular exercise.  Review of Systems      See HPI       The patient complains of headaches, abdominal pain, and muscle weakness.  The patient denies anorexia, fever, weight loss, weight gain, vision loss, decreased hearing, hoarseness, chest pain, syncope, dyspnea on exertion, peripheral edema, prolonged cough, hemoptysis, melena, hematochezia, severe indigestion/heartburn,  hematuria, incontinence, suspicious skin lesions, transient blindness, difficulty walking, depression, unusual weight change, abnormal bleeding, enlarged lymph nodes, and angioedema.         She has mult somatic complaints...  Vital Signs:  Patient profile:   60 year old female Height:      65 inches Weight:      184 pounds BMI:     30.73 O2 Sat:      97 % on Room air Temp:     98.3 degrees F oral Pulse rate:   81 / minute BP sitting:   122 / 78  (left arm) Cuff size:   regular  Vitals Entered By: Randell Loop CMA (Jul 09, 2009 11:35 AM)  O2 Sat at Rest %:  97 O2 Flow:  Room air CC: 6 month ROV & review of mult medical problems... Is Patient Diabetic? No Pain Assessment Patient in pain? yes      Comments meds updated today   Physical Exam  Additional Exam:  WD, WN, 60 y/o BF in NAD... GENERAL:  Alert & oriented; pleasant & cooperative...  HEENT:  [place]/AT, EOM-wnl, PERRLA, EACs-clear, TMs-wnl, NOSE-clear, THROAT-clear & wnl. NECK:  Supple w/ fairROM; no JVD; normal carotid impulses w/o bruits; no thyromegaly or nodules palpated; no lymphadenopathy. CHEST:  Clear to P & A; without wheezes/ rales/ or rhonchi heard... HEART:  Regular Rhythm; without murmurs/ rubs/ or gallops detected...  ABDOMEN:  Soft & nontender; normal bowel sounds; no organomegaly or masses palpated... EXT: without deformities, mult bilat trigger points, mild  arthritic changes; no varicose veins/ +venous insuffic/ tr edema. NEURO:  CN's intact; motor testing normal; sensory testing normal; gait normal & balance OK. DERM:  No lesions noted; no rash etc...    MISC. Report  Procedure date:  07/08/2009  Findings:      BMP (METABOL)   Sodium                    142 mEq/L                   135-145   Potassium                 4.4 mEq/L                   3.5-5.1   Chloride                  104 mEq/L                   96-112   Carbon Dioxide            29 mEq/L                    19-32   Glucose                    98 mg/dL                    16-10   BUN                       13 mg/dL                    9-60   Creatinine                0.8 mg/dL                   4.5-4.0   Calcium                   9.6 mg/dL                   9.8-11.9   GFR                       96.61 mL/min                >60  Lipid Panel (LIPID)   Cholesterol          [H]  220 mg/dL                   1-478   Triglycerides        [H]  192.0 mg/dL                 2.9-562.1   HDL                       30.86 mg/dL                 >57.84  Cholesterol LDL - Direct                             132.8 mg/dL   Hepatic/Liver Function Panel (HEPATIC)   Total Bilirubin           0.4 mg/dL  0.3-1.2   Direct Bilirubin          0.1 mg/dL                   1.6-1.0   Alkaline Phosphatase      72 U/L                      39-117   AST                       31 U/L                      0-37   ALT                  [H]  43 U/L                      0-35   Total Protein             7.0 g/dL                    9.6-0.4   Albumin                   4.0 g/dL                    5.4-0.9  Comments:      CBC Platelet w/Diff (CBCD)   White Cell Count          5.8 K/uL                    4.5-10.5   Red Cell Count            4.07 Mil/uL                 3.87-5.11   Hemoglobin                12.8 g/dL                   81.1-91.4   Hematocrit                38.5 %                      36.0-46.0   MCV                       94.6 fl                     78.0-100.0   Platelet Count            291.0 K/uL                  150.0-400.0   Neutrophil %              61.3 %                      43.0-77.0   Lymphocyte %              31.6 %                      12.0-46.0   Monocyte %                4.7 %  3.0-12.0   Eosinophils%              2.0 %                       0.0-5.0   Basophils %               0.4 %                       0.0-3.0  TSH (TSH)   FastTSH                   1.75 uIU/mL                 0.35-5.50  Uric Acid  (URIC)   Uric Acid                 3.9 mg/dL                   8.2-9.5   Impression & Recommendations:  Problem # 1:  HYPERTENSION (ICD-401.9) Controlled on 3 meds>  continue same... The following medications were removed from the medication list:    Hydrochlorothiazide 25 Mg Tabs (Hydrochlorothiazide) .Marland Kitchen... Take 1 tablet by mouth once a day Her updated medication list for this problem includes:    Toprol Xl 100 Mg Xr24h-tab (Metoprolol succinate) .Marland Kitchen... Take 1 tablet by mouth once a day    Amlodipine Besylate 10 Mg Tabs (Amlodipine besylate) .Marland Kitchen... Take 1 tablet by mouth once a day    Diovan 320 Mg Tabs (Valsartan) .Marland Kitchen... Take 1 tablet by mouth once a day  Problem # 2:  MITRAL VALVE PROLAPSE, HX OF (ICD-V12.50) Denies signif CP/ palpit/ etc...  Problem # 3:  HYPERCHOLESTEROLEMIA (ICD-272.0) On diet + FishOil & she doesn't want to add statin... we discussed improvement in diet & exercise necessary to improve her lipid panel...  Problem # 4:  GERD (ICD-530.81) Continue Omep... Her updated medication list for this problem includes:    Omeprazole 20 Mg Cpdr (Omeprazole) .Marland Kitchen... Take 1 tablet by mouth once a day    Dicyclomine Hcl 20 Mg Tabs (Dicyclomine hcl) .Marland Kitchen... Take 1 tab by mouth three times a day as needed abd cramping...  Problem # 5:  IRRITABLE BOWEL SYNDROME (ICD-564.1) Continue Bentyl...  Problem # 6:  FIBROMYALGIA (ICD-729.1) Followed by drDeveshwar for RHEUM>  FM, Gout, DJD, chr pain... continue her Rx. Her updated medication list for this problem includes:    Vicodin 5-500 Mg Tabs (Hydrocodone-acetaminophen) .Marland Kitchen... Take as directed by drdeveshwar...    Robaxin 500 Mg Tabs (Methocarbamol) .Marland Kitchen... Take 1 tab by mouth three times a day as needed for muscle spasm....    Flexeril 10 Mg Tabs (Cyclobenzaprine hcl) .Marland Kitchen... Take 1/2 to 1 tablet by mouth at bedtime as needed  Problem # 7:  PSYCHIATRIC DISORDER (ICD-300.9) Followed by Lindaann Slough for Psyche>  on Wellbutrin, Cymbalta,  Seroquel...  Problem # 8:  OTHER MEDICAL ISSUES AS NOTED>>>  Complete Medication List: 1)  Toprol Xl 100 Mg Xr24h-tab (Metoprolol succinate) .... Take 1 tablet by mouth once a day 2)  Amlodipine Besylate 10 Mg Tabs (Amlodipine besylate) .... Take 1 tablet by mouth once a day 3)  Diovan 320 Mg Tabs (Valsartan) .... Take 1 tablet by mouth once a day 4)  Fish Oil 1000 Mg Caps (Omega-3 fatty acids) .... Take one tablet by mouth once daily 5)  Omeprazole 20 Mg Cpdr (Omeprazole) .... Take 1 tablet  by mouth once a day 6)  Dicyclomine Hcl 20 Mg Tabs (Dicyclomine hcl) .... Take 1 tab by mouth three times a day as needed abd cramping... 7)  Allopurinol 300 Mg Tabs (Allopurinol) .... Take 1 1/2  tab by mouth once daily.Marland KitchenMarland Kitchen 8)  Colcrys 0.6 Mg Tabs (Colchicine) .... Take as directed by drdeveshwar... 9)  Vicodin 5-500 Mg Tabs (Hydrocodone-acetaminophen) .... Take as directed by drdeveshwar... 10)  Neurontin 300 Mg Caps (Gabapentin) .... Take one tablet by mouth three times a day as needed for pain 11)  Robaxin 500 Mg Tabs (Methocarbamol) .... Take 1 tab by mouth three times a day as needed for muscle spasm.... 12)  Flexeril 10 Mg Tabs (Cyclobenzaprine hcl) .... Take 1/2 to 1 tablet by mouth at bedtime as needed 13)  Wellbutrin Xl 300 Mg Xr24h-tab (Bupropion hcl) .... Take 1 tablet by mouth once a day 14)  Cymbalta 60 Mg Cpep (Duloxetine hcl) .... Take 1 tablet by mouth two times a day 15)  Seroquel 300 Mg Tabs (Quetiapine fumarate) .... Take 2 tablets by mouth at bedtime 16)  Vitamin D 78295 Unit Caps (Ergocalciferol) .... Take one tablet by mouth once weekly  Patient Instructions: 1)  Today we updated your med list- see below.... 2)  we refilled your meds per request... 3)  We reviewed your recent labs and sent a copy to [employer]... 4)  Keep up the good work! 5)  Please schedule a follow-up appointment in 6 months. Prescriptions: DICYCLOMINE HCL 20 MG TABS (DICYCLOMINE HCL) take 1 tab by mouth  three times a day as needed abd cramping...  #100 x prn   Entered and Authorized by:   Michele Mcalpine MD   Signed by:   Michele Mcalpine MD on 07/09/2009   Method used:   Print then Give to Patient   RxID:   [id number] OMEPRAZOLE 20 MG  CPDR (OMEPRAZOLE) Take 1 tablet by mouth once a day  #30 x prn   Entered and Authorized by:   Michele Mcalpine MD   Signed by:   Michele Mcalpine MD on 07/09/2009   Method used:   Print then Give to Patient   RxID:   [id number] DIOVAN 320 MG  TABS (VALSARTAN) Take 1 tablet by mouth once a day  #30 x prn   Entered and Authorized by:   Michele Mcalpine MD   Signed by:   Michele Mcalpine MD on 07/09/2009   Method used:   Print then Give to Patient   RxID:   [id number] AMLODIPINE BESYLATE 10 MG  TABS (AMLODIPINE BESYLATE) Take 1 tablet by mouth once a day  #30 x prn   Entered and Authorized by:   Michele Mcalpine MD   Signed by:   Michele Mcalpine MD on 07/09/2009   Method used:   Print then Give to Patient   RxID:   [id number] TOPROL XL 100 MG  XR24H-TAB (METOPROLOL SUCCINATE) Take 1 tablet by mouth once a day  #30 x prn   Entered and Authorized by:   Michele Mcalpine MD   Signed by:   Michele Mcalpine MD on 07/09/2009   Method used:   Print then Give to Patient   RxID:   [id number]

## 2010-04-08 ENCOUNTER — Encounter: Payer: Self-pay | Admitting: Pulmonary Disease

## 2010-04-27 NOTE — Letter (Signed)
Summary: [facility]  [facility]   Imported By: Sherian Rein 04/20/2010 11:47:57  _____________________________________________________________________  External Attachment:    Type:   Image     Comment:   External Document

## 2010-07-08 ENCOUNTER — Ambulatory Visit: Payer: Self-pay | Admitting: Pulmonary Disease

## 2010-07-13 NOTE — Assessment & Plan Note (Signed)
[facility]                         GASTROENTEROLOGY OFFICE NOTE   NAME:Tammy Mullins                      MRN:          [id number]  DATE:01/10/2007                            DOB:          1950-06-19    CHIEF COMPLAINT:  Followup of reflux hiatal hernia.   She had an endoscopy for a followup of Barrett's.  She has a hiatal  hernia.  I do not think she has Barrett's esophagus, based upon the  biopsies and the findings there.  There was no intestinal metaplasia on  these biopsies, though she had apparently had some in the past.  I think  those biopsies may have come from the cardia.  Unfortunately, she came  off of her pantoprazole due to some formulary issues, and she is on  omeprazole 20 mg daily at this time.   She is actually doing okay, but would like to go back to the  pantoprazole.  She is not having any dysphagia.  She is having  postprandial urgent defecation over the past couple of months.  She is  on colchicine b.i.d. for gout.  It sounds like, looking back through the  charts, that she has had problems similar to this; or at least  postprandial cramps and things like that.  Has had a very longstanding  diagnosis of irritable bowel syndrome.  A colonoscopy in 2002 was  negative.  She does have diverticulosis.  There is clearly complaints of  postprandial abdominal pain and diarrhea in the chart from before.   PAST MEDICAL HISTORY:  1. Reflux disease.  2. Question of Barrett's esophagus versus diethyl metaplasia of the      cardia.  I favor the latter.  3. Diverticulosis.  4. Irritable bowel syndrome.  5. Attention deficit disorder.  6. Hypertension.  7. Anxiety.  8. Allergies.  9. Sinusitis.  10.Chronic DeQuervain's tenosynovitis.  11.Prior cervical neck surgery.  12.Prior ankle fracture and foot pain, with neuropathy.  13.Fibromyalgia.  14.Lumbar spine degenerative disk disease, as well as C-spine      degenerative disk  disease.  15.Chronic pain syndrome.  16.Gout.  17.Osteoarthritis.  18.Hypertension.   REVIEW OF SYSTEMS:  Apparently her LFTs are up a little bit, and she is  having some repeat labs through Dr. Corliss Skains, possibly medication  related.   MEDICATIONS:  Listed and reviewed in the chart.   Height 5 feet 6 inches, weight 194.8 pounds; she is obese.  Pulse 100,  blood pressure 144/82.  ABDOMEN:  Soft and nontender; without air or mass.  SKIN:  Warm and dry.  NEUROLOGIC:  She is alert and oriented x3.   ASSESSMENT:  1. Gastroesophageal reflux disease.  2. Hiatal hernia.  3. Previous diagnosis of Barrett's esophagus.  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  4. Irritable bowel syndrome, which I think is flaring.  5. History of diverticulosis.  6. She had a negative colonoscopy 6 years ago, and her symptom pattern      alternates over times.  I think  that the problems described here      with respect to the bowels are irritable bowel.  7. Poly-pharmacy; though it sounds necessary for concomittant      fibromyalgia and chronic pain syndrome.   PLANS:  1. Discontinue Robanol Forte; she is actually out of that now.  2. See if she can reduce her colchicine to once a day.  I have asked      her to talk to Dr. Corliss Skains about; that may be aggravating the      diarrhea some.  Try dicyclomine 20 mg before meals.  3. I have gone ahead and prescribed pantoprazole 40 mg daily; which is      supposed to be a preferred drug under her Medicare Part D plan.  We      will see what happens.  I think that would be better for her,      though we could use 40 mg of omeprazole if needed -- we will see.  4. I will see her back as needed at this point.  I think a routine      colonoscopy in 2012 makes sense at this time; though if things      worsen we may need to do that sooner.  I would not pull the trigger      on that now.     Iva Boop, MD,FACG  Electronically Signed    CEG/MedQ  DD: 01/10/2007  DT: 01/11/2007  Job #: [id number]   cc:   Pollyann Savoy, M.D.

## 2010-07-16 NOTE — Letter (Signed)
January 18, 2006     RE:  Tammy Mullins, Tammy Mullins  MRN:  [id number]  /  DOB:  09-03-1950   TO WHOM IT MAY CONCERN:   [name] is a 60 year old patient of mine whom I follow for  general medical purposes.  She has multiple medical problems including  degenerative arthritis, fibromyalgia and chronic pain syndrome.  She is on  numerous medications from [employer] and  Psychiatry.   From a medical standpoint she has hypertension and mitral valve prolapse.  She has a history of allergic rhinitis and sinusitis. She has a hiatus  hernia and irritable bowel syndrome.  She has mild hypercholesterolemia  which she controls on diet alone.  Her medical regimen includes Diovan,  Toprol, Norvasc, hydrochlorothiazide and Protonix.  From her pain doctors  and psychiatrist, she takes Wellbutrin, Cymbalta, Seroquel, Lyrica and  Rozerem.   The patient requested this letter to document her medical problems for  disability purposes.  I hope this information will be helpful to you, and if  I can be of further assistance, please feel free to contact my office.    Sincerely,      Lonzo Cloud. Kriste Basque, MD  Electronically Signed    SMN/MedQ  DD: 01/18/2006  DT: 01/18/2006  Job #: [id number]

## 2010-08-10 ENCOUNTER — Ambulatory Visit (INDEPENDENT_AMBULATORY_CARE_PROVIDER_SITE_OTHER): Payer: 59 | Admitting: Pulmonary Disease

## 2010-08-10 ENCOUNTER — Encounter: Payer: Self-pay | Admitting: Pulmonary Disease

## 2010-08-10 DIAGNOSIS — J309 Allergic rhinitis, unspecified: Secondary | ICD-10-CM

## 2010-08-10 DIAGNOSIS — F411 Generalized anxiety disorder: Secondary | ICD-10-CM

## 2010-08-10 DIAGNOSIS — M1990 Unspecified osteoarthritis, unspecified site: Secondary | ICD-10-CM

## 2010-08-10 DIAGNOSIS — K589 Irritable bowel syndrome without diarrhea: Secondary | ICD-10-CM

## 2010-08-10 DIAGNOSIS — G894 Chronic pain syndrome: Secondary | ICD-10-CM

## 2010-08-10 DIAGNOSIS — F988 Other specified behavioral and emotional disorders with onset usually occurring in childhood and adolescence: Secondary | ICD-10-CM

## 2010-08-10 DIAGNOSIS — K5730 Diverticulosis of large intestine without perforation or abscess without bleeding: Secondary | ICD-10-CM

## 2010-08-10 DIAGNOSIS — IMO0001 Reserved for inherently not codable concepts without codable children: Secondary | ICD-10-CM

## 2010-08-10 DIAGNOSIS — E7800 Pure hypercholesterolemia, unspecified: Secondary | ICD-10-CM

## 2010-08-10 DIAGNOSIS — F489 Nonpsychotic mental disorder, unspecified: Secondary | ICD-10-CM

## 2010-08-10 DIAGNOSIS — M109 Gout, unspecified: Secondary | ICD-10-CM

## 2010-08-10 DIAGNOSIS — I10 Essential (primary) hypertension: Secondary | ICD-10-CM

## 2010-08-10 DIAGNOSIS — Z8679 Personal history of other diseases of the circulatory system: Secondary | ICD-10-CM

## 2010-08-10 DIAGNOSIS — K219 Gastro-esophageal reflux disease without esophagitis: Secondary | ICD-10-CM

## 2010-08-10 MED ORDER — FLUTICASONE PROPIONATE 50 MCG/ACT NA SUSP: 2.0000 | Freq: Every day | NASAL | Status: DC

## 2010-08-10 NOTE — Patient Instructions (Signed)
Today we updated your med list in EPIC...    We refilled your Flonase & you may have the Pharmacist call us for any other needed med refills...  Please return to our lab in the AM for your fasting blood work...    Please call the [facility]REE in a few days for your results...    Dial [id number] & when prompted enter your patient number followed by the # symbol...    Your patient number is:  [phone]#  We will send copies to DrDeveshwar for her review...  Call for any questions...  Let's continue our 6 month follow up visits, but call sooner as needed for problems.Marland KitchenMarland Kitchen

## 2010-08-10 NOTE — Progress Notes (Signed)
Subjective:    Patient ID: Tammy Mullins, female    DOB: 05-13-1950, 60 y.o.   MRN: [id number]  HPI 60 y/o BF here for a follow up visit... she has multiple medical problems as noted below...   ~  we have prev filled out disability papers at her request-  she is disabled due to multiple medical problems and DJD/ Fibromyalgia/ Chronic Pain syndrome/ and Psychiatric issues... these problems have been evaluated and are followed by mult other practitioners including: DrDeveshwar (Rheum), DrNitka (Ortho), DrHirsh (Neurosurg), and [facility] (Lisa[facility]oul[facility])...  ~  Jul 09, 2009:  Stable overall>  saw DrDeveshwar 2/11 for f/u FM, generalized pain, hx gout, right knee problems> on Allopurinol, Colcrys, Vicodin, Robaxin, Neurontin (they signed narcotic contract)... she also f/u w/ Psychiatry on Wellbutrin, Cymbalta, Seroquel... BP controlled as below;  Chol is fair but she doesn't want statin Rx;  GI stable w/ acid reflux & IBS... recent labs reviewed w/ pt.  ~  January 06, 2010:  she's had a reasonably good 41mo> but continues w/ her chronic problems including aching/ sore from the [facility] etc & she continues to f/u w/ DrDeveshwar for Rheum- meds unchanged as below... she also maintains regular f/u w/ LisaPoulas/ Psychiatry- maintained on the same 3 meds as noted... from the medical standpoint> BP controlled, remains on diet for chol etc, & GI system is stable on her Prn meds as listed... OK Flu vaccine today> we discussed continue current regimen & f/u OV w/ fasting labs in 54mo.  ~  August 10, 2010:  17mo ROV & she is c/o some left shoulder pain being treated by her Rheumatologist DrDeveshwar & she brings in a note requesting labs to be drawn & sent to Rheum for review;  She continues on 6 medication regimen from Rheum/ Ortho including Vicodin, Robaxin/ Flexeril, Allopurinol, Colcrys, & Neurontin; she has a narcotic contract w/ DrDeveshwar;  otherw she reports stable> notes some ongoing nasal allergy  symptoms & she would like her Flonase refilled; BP has been controlled on 3 med regimen & tolerated well; she has been trying to control her Lipids w/ diet & FishOil but current FLP is worse & we will rec referral to the Lipid clinic for their input; GI managed by DrGessner & she remains on Omeprazole & Bentyl; Psychiatric issues managed by DrPoulas on 3 meds- Wellbutrin, Cymbalta, Seroquel...   Problem List:  ALLERGIC RHINITIS (ICD-477.9) - she uses OTC antihistamines, mucinex, saline and FLONASE for her sinuses...  HYPERTENSION (ICD-401.9) - controlled on ASA 81mg /d, TOPROL XL 100/d,  AMLODIPINE 10mg /d,  LOSARTAN 100mg /d... BP= 136/84 and feeling OK, tolerating meds well... denies HA, visual changes, CP, palipit, syncope, dyspnea, edema, etc...  MITRAL VALVE PROLAPSE, HX OF (ICD-V12.50) - notes some intermittent sharp CP & palpit...  HYPERCHOLESTEROLEMIA (ICD-272.0) - on diet + OMEGA-3 Fish Oil... she has not been interested in statin Rx... ~  FLP 10/06 by her GYN showed TChol 254, TG 364, HDL 38, LDL 143 ~  FLP 2/10 showed TChol 221, TG 194, HDL 37, LDL 130... rec> better diet, she refuses statin meds. ~  FLP 5/11 showed TChol 220, TG 192, HDL 40, LDL 133 ~  FLP 6/12 showed TChol 261, TG 237, HDL 48, LDL 162... rec refer to Lipid Clinic for management  GERD (ICD-530.81) & ? of BARRETTS ESOPHAGUS (ICD-530.85) - long hx of GERD symptoms and followed by DrGessner for GI... she takes OMEPRAZOLE 20mg /d + GasX etc... last EGD 9/08 showed sm HH and ?1cm segment  of Barrett's but bx was neg... therefore routine f/u suggested...   DIVERTICULOSIS, COLON (ICD-562.10) & IRRITABLE BOWEL SYNDROME (ICD-564.1) - she takes DICYCLOMINE 20mg Tid from [employer]...  ~  colonoscopy 11/02 showed mild divertics, otherw neg...  ~  10/09:  f/u GI eval by DrGessner due to symptoms of pain, diarrhea, bloating> colonoscopy was normal & bx= neg, therefore felt to all be c/w IBS...  OSTEOARTHRITIS (ICD-715.90) GOUT  (ICD-274.9) FIBROMYALGIA (ICD-729.1) DE QUERVAIN'S TENOSYNOVITIS (ICD-727.04) CHRONIC PAIN SYNDROME (ICD-338.4) - as noted above>  these problems are followed by various specialists including:  DrDeveshwar (Rheum), DrNitka/Caffrey (Ortho), DrHirsh (Neurosurg)...  she is currently taking VICODIN Prn,  ROBAXIN 500mg Tid (uses Prn),  FLEXERIL 10mg - 1-2 Qhs, ALLOPURINOL 300mg - 1.5 tabs daily,  COLCRYS 0.6mg Bid Prn, & NEURONTIN 300mg Tid (she takes Prn)... ~  She reports prev neck surg w/ plate from DrHirsh, and left rotator cuff surg by DrCaffrey... ~  11/10:  she signed a narcotic contract w/ [employer]... ~  2/11:  note from DrDeveshwar reviewed & pain med Rx noted...  VIT D DEFICIENCY>  She is taking Vit D 50000u weekly ~  Vit D level 2/10 here = 17... rec to start Vit d supplementation....  ATTENTION DEFICIT DISORDER (ICD-314.00) PSYCHIATRIC DISORDER (ICD-300.9) ANXIETY (ICD-300.00) - as noted above>  these problems are followed and treated by [facility])... we do not have notes from these doctors but the pt states that she is prev being treated w/ Adderall, WellbutrinXR, Cymbalta, Seroquel, and Rozerem... ~  2/10:  they make freq adjustments & she indicates that she is off the Adderall & Rozerum now... ~  11/10: she indicates stable on the Wellbutrin, Cymbalta, Seroquel... ~  5/11:  pt indicates that she is stable on WELLBUTRIN XL 300mg /d,  CYMBALTA 60mg Bid, SEROQUEL 300mg - 2Qhs...  PRURITUS (ICD-698.9) - she saw DrWoods w/ Hydroxyzine perscribed...   Past Surgical History  Procedure Date  . Rt  ankle   . Carpal tunnel release   . Rotator cuff repair   . C-spine     Outpatient Encounter Prescriptions as of 08/10/2010  Medication Sig Dispense Refill  . allopurinol (ZYLOPRIM) 300 MG tablet Take 450 mg by mouth daily. Take 1 1/2 tab by mouth daily.       Marland Kitchen amLODipine (NORVASC) 10 MG tablet Take 10 mg by mouth daily.        Marland Kitchen aspirin 81 MG tablet Take 81 mg by  mouth daily.        Marland Kitchen buPROPion (WELLBUTRIN XL) 300 MG 24 hr tablet Take 300 mg by mouth daily.        . colchicine 0.6 MG tablet Take 0.6 mg by mouth daily. Take as directed by Dr. Corliss Skains.       . cyclobenzaprine (FLEXERIL) 10 MG tablet Take 10 mg by mouth at bedtime as needed. Take 1/2 to 1 tab by mouth at bedtime as needed.       . dicyclomine (BENTYL) 20 MG tablet Take 20 mg by mouth 3 (three) times daily as needed.        . DULoxetine (CYMBALTA) 60 MG capsule Take 60 mg by mouth 2 (two) times daily.        . ergocalciferol (VITAMIN D2) 50000 UNITS capsule Take 50,000 Units by mouth once a week.        . fish oil-omega-3 fatty acids 1000 MG capsule Take 2 g by mouth daily.        . fluticasone (FLONASE) 50 MCG/ACT nasal spray Place 2  sprays into the nose at bedtime.        . gabapentin (NEURONTIN) 300 MG capsule Take 300 mg by mouth 3 (three) times daily as needed.        Marland Kitchen HYDROcodone-acetaminophen (VICODIN) 5-500 MG per tablet Take 1 tablet by mouth every 6 (six) hours as needed.        Marland Kitchen losartan (COZAAR) 100 MG tablet Take 100 mg by mouth daily.        . methocarbamol (ROBAXIN) 500 MG tablet Take 500 mg by mouth 3 (three) times daily as needed. Take as directed by Dr. Corliss Skains.       . metoprolol (TOPROL XL) 100 MG 24 hr tablet Take 100 mg by mouth daily.        Marland Kitchen omeprazole (PRILOSEC) 20 MG capsule Take 20 mg by mouth daily.        . QUEtiapine (SEROQUEL) 300 MG tablet Take 600 mg by mouth at bedtime.          No Known Allergies   Review of Systems        See HPI - all other systems neg except as noted... The patient complains of dyspnea on exertion.  The patient denies anorexia, fever, weight loss, weight gain, vision loss, decreased hearing, hoarseness, chest pain, syncope, peripheral edema, prolonged cough, headaches, hemoptysis, abdominal pain, melena, hematochezia, severe indigestion/heartburn, hematuria, incontinence, muscle weakness, suspicious skin lesions, transient  blindness, difficulty walking, depression, unusual weight change, abnormal bleeding, enlarged lymph nodes, and angioedema.   Objective:   Physical Exam     WD, WN, 59 y/o BF in NAD... GENERAL:  Alert & oriented; pleasant & cooperative...  HEENT:  [place]/AT, EOM-wnl, PERRLA, EACs-clear, TMs-wnl, NOSE-clear, THROAT-clear & wnl. NECK:  Supple w/ fairROM; no JVD; normal carotid impulses w/o bruits; no thyromegaly or nodules palpated; no lymphadenopathy. CHEST:  Clear to P & A; without wheezes/ rales/ or rhonchi heard... HEART:  Regular Rhythm; without murmurs/ rubs/ or gallops detected...  ABDOMEN:  Soft & nontender; normal bowel sounds; no organomegaly or masses palpated... EXT: without deformities, mult bilat trigger points, mild arthritic changes; no varicose veins/ +venous insuffic/ tr edema. NEURO:  CN's intact; motor testing normal; sensory testing normal; gait normal & balance OK. DERM:  No lesions noted; no rash etc...   Assessment & Plan:   HBP>  Controlled on her 3 med regimen, continue same...  CHOL>  FLP is further deranged on diet efforts alone;  We reviewed the needed low chol/ low fat diet 7 she is advised to lose 20lbs;  We will refer to the Lipid clinic to work closely w/ her on med Rx...  GERD>  follwed by drGessner for GI on Omeprazole for her reglux symptoms & ?Barrett's...  Divertics/ IBS>  She has Bentyl to use for abd cramping etc...   RHEUM>  DJD/ Gout/ FM/ etc>  Managed by [employer] w/ help from DrCaffrey, DrHirsh, etc;  Pt has narcotic contract w/ DrD & sstable on current 6 med regimen noted above...  Psyche>  Followed by drPoulas on 3 med regimen & appears stable.Marland KitchenMarland Kitchen

## 2010-08-11 ENCOUNTER — Other Ambulatory Visit (INDEPENDENT_AMBULATORY_CARE_PROVIDER_SITE_OTHER): Payer: 59

## 2010-08-11 ENCOUNTER — Other Ambulatory Visit: Payer: Self-pay | Admitting: Pulmonary Disease

## 2010-08-11 DIAGNOSIS — M109 Gout, unspecified: Secondary | ICD-10-CM

## 2010-08-11 DIAGNOSIS — E7800 Pure hypercholesterolemia, unspecified: Secondary | ICD-10-CM

## 2010-08-11 DIAGNOSIS — K219 Gastro-esophageal reflux disease without esophagitis: Secondary | ICD-10-CM

## 2010-08-11 DIAGNOSIS — K589 Irritable bowel syndrome without diarrhea: Secondary | ICD-10-CM

## 2010-08-11 DIAGNOSIS — I10 Essential (primary) hypertension: Secondary | ICD-10-CM

## 2010-08-11 DIAGNOSIS — F411 Generalized anxiety disorder: Secondary | ICD-10-CM

## 2010-08-11 LAB — BASIC METABOLIC PANEL
BUN: 13 mg/dL (ref 6–23)
Calcium: 9.7 mg/dL (ref 8.4–10.5)
GFR: 90.86 mL/min (ref >60.00)
Glucose, Bld: 108 mg/dL — ABNORMAL HIGH (ref 70–99)
Potassium: 4.7 mEq/L (ref 3.5–5.1)
Sodium: 142 mEq/L (ref 135–145)

## 2010-08-11 LAB — CBC WITH DIFFERENTIAL/PLATELET
Eosinophils Relative: 2.5 % (ref 0.0–5.0)
HCT: 38.1 % (ref 36.0–46.0)
Hemoglobin: 13 g/dL (ref 12.0–15.0)
Lymphs Abs: 2.3 10*3/uL (ref 0.7–4.0)
MCV: 93.9 fl (ref 78.0–100.0)
Monocytes Absolute: 0.5 10*3/uL (ref 0.1–1.0)
Monocytes Relative: 7 % (ref 3.0–12.0)
Neutro Abs: 3.6 10*3/uL (ref 1.4–7.7)
Platelets: 271 10*3/uL (ref 150.0–400.0)
WBC: 6.6 10*3/uL (ref 4.5–10.5)

## 2010-08-11 LAB — LIPID PANEL
HDL: 48 mg/dL (ref >39.00)
Triglycerides: 237 mg/dL — ABNORMAL HIGH (ref 0.0–149.0)
VLDL: 47.4 mg/dL — ABNORMAL HIGH (ref 0.0–40.0)

## 2010-08-11 LAB — HEPATIC FUNCTION PANEL
AST: 36 U/L (ref 0–37)
Albumin: 4.5 g/dL (ref 3.5–5.2)
Alkaline Phosphatase: 86 U/L (ref 39–117)
Total Bilirubin: 0.7 mg/dL (ref 0.3–1.2)

## 2010-08-14 ENCOUNTER — Encounter: Payer: Self-pay | Admitting: Pulmonary Disease

## 2010-08-20 ENCOUNTER — Telehealth: Payer: Self-pay | Admitting: *Deleted

## 2010-08-20 DIAGNOSIS — E7800 Pure hypercholesterolemia, unspecified: Secondary | ICD-10-CM

## 2010-08-20 NOTE — Telephone Encounter (Signed)
Called and spoke with pt and she is aware of lab results and that our office will contact her once the appt has been set up for the lipid clinic

## 2010-08-24 ENCOUNTER — Other Ambulatory Visit: Payer: Self-pay | Admitting: Pulmonary Disease

## 2010-08-26 ENCOUNTER — Ambulatory Visit: Payer: 59

## 2010-08-30 ENCOUNTER — Telehealth: Payer: Self-pay | Admitting: Pulmonary Disease

## 2010-08-30 NOTE — Telephone Encounter (Signed)
Called spoke with patient, and informed her that she was referred to the lipid clinic NOT the coumadin clinic.  Pt reports that she thought that was where she was referred to but when she called to cancel/rsc her appt, the person she spoke with kept referring to the coumadin clinic and told her that they had no record of her referral.  Cards is closed as of 5pm, will hold in my box to call tomorrow to see what the confusion is.  Pt is aware.

## 2010-08-31 NOTE — Telephone Encounter (Signed)
Called lipid clinic, spoke with [place].  We figured out that the referral to the lipid clinic was placed under a hematology referral, therefore when patient called to rsc, the scheduler saw this and assumed that pt was not referred there.  Upon inspection in pt's chart, she did indeed have an appt on 6.28.12, which she cancelled on the day of.  Per Cicero Duck, pt may call [phone] and ask to speak with the lipid clinic and she will be available to speak with patient.    LMOM TCB x1.

## 2010-08-31 NOTE — Telephone Encounter (Signed)
Pt returned call.  Informed her that she may call the number given by lipid clinic to call and rsc her appt.  Pt okay with this and verbalized her understanding.

## 2010-09-06 ENCOUNTER — Ambulatory Visit (INDEPENDENT_AMBULATORY_CARE_PROVIDER_SITE_OTHER): Payer: 59

## 2010-09-06 VITALS — Wt 183.5 lb

## 2010-09-06 DIAGNOSIS — E7800 Pure hypercholesterolemia, unspecified: Secondary | ICD-10-CM

## 2010-09-06 NOTE — Patient Instructions (Signed)
It was nice to meet you today  With your diet, try to watch your portion control.  Start using a smaller plate to eat on and only eat one plate at each meal.    To help with your sweet cravings, eat fresh fruits rather than cookies and ice cream  Try to exercise at least 20-30 minutes 2-3 days a week.  This can either be walking around the neighborhood or going to the [employer]  on Monday, Wednesday, and Friday.  If you have any new or worsening muscle pains, please call the clinic.  We will recheck your labs in 2 months.

## 2010-09-07 MED ORDER — ROSUVASTATIN CALCIUM 5 MG PO TABS: ORAL_TABLET | ORAL | Status: DC

## 2010-09-07 NOTE — Progress Notes (Signed)
HPI  [name] is a pleasant 60 yr old female seen for initial evaluation in Lipid Clinic.  She has been able to control her cholesterol in the past with diet and fish oil 2 gm daily.  At her last visit with PCP, her LDL had increased 30 pts and so she was referred to Lipid Clinic for further management.  Her PMH is significant for fibromyalgia and DJD.  She is also on several medications for depression including Seroquel, which can worsen cholesterol itself.  Her only risk factor is HTN.  Her 10-year Framingham risk factor is 6%.  Given this, her LDL goal will be <130.  She has no significant family history for CVD, stroke, or hyperlipidemia.    Discussed pt's diet.  For breakfast, she may have grits with toast and jelly, corn flakes, Cheerios, or Raisin Bran with [place] juices or water.  Lunch may be a bologna and cheese, tuna salad, or chicken salad sandwich.  She occasionally has potato chips.  She likes to stop and get fast food french fries and sweet tea for lunch but has cut back on this since she go the results of her labwork.  She lives by herself and does not like to cook dinner.  If she eats at home, it is often a frozen dinner.  She likes to go to [employer] and [employer], but admits to eating multiple plates of food at the buffets.  She does not eat any red meat or pork and takes the skin off chicken and fishhhh.  Her weakness is sweats.  She loves cookies, ice cream, popsicles, apple sauce and canned fruit.  She does drink at least one soft drink a day and 2 glasses of sweet tea.   Pt's exercise is somewhat limited due to her fibromyalgia, gout and DJD.  She is able to do housework and was walking her dog a few times a week but it has been too hot.  [facility] but has not [facility] this up.   Current Outpatient Prescriptions  Medication Sig Dispense Refill  . allopurinol (ZYLOPRIM) 300 MG tablet Take 450 mg by mouth daily. Take 1  1/2 tab by mouth daily.       Marland Kitchen amLODipine (NORVASC) 10 MG tablet Take 10 mg by mouth daily.        Marland Kitchen aspirin 81 MG tablet Take 81 mg by mouth daily.        Marland Kitchen buPROPion (WELLBUTRIN XL) 300 MG 24 hr tablet Take 300 mg by mouth daily.        . colchicine 0.6 MG tablet Take 0.6 mg by mouth daily. Take as directed by Dr. Corliss Skains.       . cyclobenzaprine (FLEXERIL) 10 MG tablet Take 10 mg by mouth at bedtime as needed. Take 1/2 to 1 tab by mouth at bedtime as needed.       . dicyclomine (BENTYL) 20 MG tablet Take 20 mg by mouth 3 (three) times daily as needed.        . DULoxetine (CYMBALTA) 60 MG capsule Take 60 mg by mouth 2 (two) times daily.        . ergocalciferol (VITAMIN D2) 50000 UNITS capsule Take 50,000 Units by mouth once a week.        . fish oil-omega-3 fatty acids 1000 MG capsule Take 2 g by mouth daily.        Marland Kitchen  fluticasone (FLONASE) 50 MCG/ACT nasal spray Place 2 sprays into the nose at bedtime.  16 g  11  . gabapentin (NEURONTIN) 300 MG capsule Take 300 mg by mouth 3 (three) times daily as needed.        Marland Kitchen HYDROcodone-acetaminophen (VICODIN) 5-500 MG per tablet Take 1 tablet by mouth every 6 (six) hours as needed.        Marland Kitchen losartan (COZAAR) 100 MG tablet Take 100 mg by mouth daily.        . methocarbamol (ROBAXIN) 500 MG tablet Take 500 mg by mouth 3 (three) times daily as needed. Take as directed by Dr. Corliss Skains.       . metoprolol (TOPROL XL) 100 MG 24 hr tablet Take 100 mg by mouth daily.        Marland Kitchen omeprazole (PRILOSEC) 20 MG capsule TAKE ONE CAPSULE BY MOUTH DAILY  30 capsule  PRN  . QUEtiapine (SEROQUEL) 300 MG tablet Take 600 mg by mouth at bedtime.          No Known Allergies

## 2010-09-07 NOTE — Assessment & Plan Note (Signed)
Pt's current cholesterol: TC- 261 (goal<200), TG- 237 (goal<150), HDL- 48 (goal>45), and LDL- 162 (goal<130).  This is higher than she has been over the past few years.  Pt has never taken anything to treat her cholesterol.  Increase may be due to Seroquel, but pt is stable on this medication so would not suggest stopping at this time. Pt has lots of room to improve with diet and exercise.  Discussed decreasing portion sizes and sweets for now.  Encouraged [facility] to [facility] and try water activities to help with pain.  Will start low dose statin as well to help reach LDL goal.  Will use Crestor 5mg  daily but only take on Monday, Wednesday and Friday.  Recheck labs in 2 months.

## 2010-10-04 ENCOUNTER — Telehealth: Payer: Self-pay | Admitting: Pharmacist

## 2010-10-04 NOTE — Telephone Encounter (Signed)
Pt called to report worsening muscle and joint pain since starting Crestor.  Instructed pt to hold Crestor and see if joint pain improves.  Will f/u at regular scheduled appt in September

## 2010-11-03 ENCOUNTER — Other Ambulatory Visit: Payer: 59

## 2010-11-04 ENCOUNTER — Ambulatory Visit: Payer: 59 | Admitting: Adult Health

## 2010-11-04 ENCOUNTER — Ambulatory Visit: Payer: 59

## 2010-11-04 ENCOUNTER — Encounter: Payer: Self-pay | Admitting: Pulmonary Disease

## 2010-11-04 ENCOUNTER — Ambulatory Visit (INDEPENDENT_AMBULATORY_CARE_PROVIDER_SITE_OTHER): Payer: 59 | Admitting: Pulmonary Disease

## 2010-11-04 VITALS — BP 146/88 | HR 114 | Temp 98.9°F | Ht 65.0 in | Wt 185.4 lb

## 2010-11-04 DIAGNOSIS — K5730 Diverticulosis of large intestine without perforation or abscess without bleeding: Secondary | ICD-10-CM

## 2010-11-04 DIAGNOSIS — F988 Other specified behavioral and emotional disorders with onset usually occurring in childhood and adolescence: Secondary | ICD-10-CM

## 2010-11-04 DIAGNOSIS — Z79899 Other long term (current) drug therapy: Secondary | ICD-10-CM

## 2010-11-04 DIAGNOSIS — F489 Nonpsychotic mental disorder, unspecified: Secondary | ICD-10-CM

## 2010-11-04 DIAGNOSIS — K1120 Sialoadenitis, unspecified: Secondary | ICD-10-CM

## 2010-11-04 DIAGNOSIS — M109 Gout, unspecified: Secondary | ICD-10-CM

## 2010-11-04 DIAGNOSIS — M1990 Unspecified osteoarthritis, unspecified site: Secondary | ICD-10-CM

## 2010-11-04 DIAGNOSIS — K219 Gastro-esophageal reflux disease without esophagitis: Secondary | ICD-10-CM

## 2010-11-04 DIAGNOSIS — G894 Chronic pain syndrome: Secondary | ICD-10-CM

## 2010-11-04 DIAGNOSIS — K589 Irritable bowel syndrome without diarrhea: Secondary | ICD-10-CM

## 2010-11-04 DIAGNOSIS — F411 Generalized anxiety disorder: Secondary | ICD-10-CM

## 2010-11-04 DIAGNOSIS — E559 Vitamin D deficiency, unspecified: Secondary | ICD-10-CM | POA: Insufficient documentation

## 2010-11-04 DIAGNOSIS — I10 Essential (primary) hypertension: Secondary | ICD-10-CM

## 2010-11-04 DIAGNOSIS — IMO0001 Reserved for inherently not codable concepts without codable children: Secondary | ICD-10-CM

## 2010-11-04 DIAGNOSIS — E7800 Pure hypercholesterolemia, unspecified: Secondary | ICD-10-CM

## 2010-11-04 LAB — HEPATIC FUNCTION PANEL
AST: 46 U/L — ABNORMAL HIGH (ref 0–37)
Alkaline Phosphatase: 91 U/L (ref 39–117)
Bilirubin, Direct: 0.1 mg/dL (ref 0.0–0.3)
Total Bilirubin: 0.6 mg/dL (ref 0.3–1.2)

## 2010-11-04 LAB — LIPID PANEL
Cholesterol: 224 mg/dL — ABNORMAL HIGH (ref 0–200)
Total CHOL/HDL Ratio: 5
VLDL: 37.8 mg/dL (ref 0.0–40.0)

## 2010-11-04 MED ORDER — AMOXICILLIN-POT CLAVULANATE 875-125 MG PO TABS: 1.0000 | ORAL_TABLET | Freq: Two times a day (BID) | ORAL | Status: AC

## 2010-11-04 NOTE — Patient Instructions (Signed)
Today we updated your med list in EPIC...    Continue your current meds the same..     For your facial swelling/ swollen glands:    Use the hot soaks/ warm compresses...    Get the saliva to flow by sucking on lemon drops or chewing a sugarless gum...    Take the AUGMENTIN antibiotic twice daily til gone...  Call for any problems.Marland KitchenMarland Kitchen

## 2010-11-04 NOTE — Progress Notes (Signed)
Subjective:    Patient ID: Tammy Mullins, female    DOB: 08/23/1950, 60 y.o.   MRN: [id number]  HPI  60 y/o BF here for a follow up visit... she has multiple medical problems as noted below...   ~  we have prev filled out disability papers at her request-  she is disabled due to multiple medical problems and DJD/ Fibromyalgia/ Chronic Pain syndrome/ and Psychiatric issues... these problems have been evaluated and are followed by mult other practitioners including: DrDeveshwar (Rheum), DrNitka (Ortho), DrHirsh (Neurosurg), and [facility] (LisaPoulas)...  ~  Jul 09, 2009:  Stable overall>  saw DrDeveshwar 2/11 for f/u FM, generalized pain, hx gout, right knee problems> on Allopurinol, Colcrys, Vicodin, Robaxin, Neurontin (they signed narcotic contract)... she also f/u w/ Psychiatry on Wellbutrin, Cymbalta, Seroquel... BP controlled as below;  Chol is fair but she doesn't want statin Rx;  GI stable w/ acid reflux & IBS... recent labs reviewed w/ pt.  ~  January 06, 2010:  she's had a reasonably good 20mo> but continues w/ her chronic problems including aching/ sore from the [facility] etc & she continues to f/u w/ DrDeveshwar for Rheum- meds unchanged as below... she also maintains regular f/u w/ LisaPoulas/ Psychiatry- maintained on the same 3 meds as noted... from the medical standpoint> BP controlled, remains on diet for chol etc, & GI system is stable on her Prn meds as listed... OK Flu vaccine today> we discussed continue current regimen & f/u OV w/ fasting labs in 51mo.  ~  August 10, 2010:  66mo ROV & she is c/o some left shoulder pain being treated by her Rheumatologist DrDeveshwar & she brings in a note requesting labs to be drawn & sent to Rheum for review;  She continues on 6 medication regimen from Rheum/ Ortho including Vicodin, Robaxin/ Flexeril, Allopurinol, Colcrys, & Neurontin; she has a narcotic contract w/ DrDeveshwar;  otherw she reports stable> notes some ongoing nasal allergy  symptoms & she would like her Flonase refilled; BP has been controlled on 3 med regimen & tolerated well; she has been trying to control her Lipids w/ diet & FishOil but current FLP is worse & we will rec referral to the Lipid clinic for their input; GI managed by DrGessner & she remains on Omeprazole & Bentyl; Psychiatric issues managed by DrPoulas on 3 meds- Wellbutrin, Cymbalta, Seroquel...  ~  November 04, 2010:  6mo ROV & add-on for right facial swelling noted today> states swollen area right mandible & below jawline, min tender, no redness, denies dental prob, no pain chewing, but has some dry mouth; exam showed right submandib gland swelling, min tender, cannot see duct abn or obvious stone; discussed rx w/ warm compresses, sialogogues, fluids, & we will cover w/ Augmentin for completeness...  >Her BP is controlled on Metoprolol, Amlodipine, Losartan; BP= 146/88 & usually better at home (she didn't take meds today); denies CP, palpit, syncope, edema, etc...  >Chol is treated w/ diet & FishOil; she was seen by Lipid Clinic & tried on Cres5MWF but she developed incapacitating muscle pain "every muscle in my body" so she stopped the Crestor & the symptoms resolved; she has f/u w/ LC soon...  >She has GERD & Barrett's esoph on Omep & gas meds;  Divertics & IBS on Bentyl from DrGessner; stable & doing reasonably well...  >She is followed by Rheum, Ortho, Neurosurg for her DJD, Gout, FM, chr pain syndrome...  >She is still on Vit D 50K weekly.  >She  continues under the care of [facility], drPoulas on Wellbutrin, Cymbalta, Seroquel.   Problem List:  ALLERGIC RHINITIS (ICD-477.9) - she uses OTC antihistamines, mucinex, saline and FLONASE for her sinuses...  HYPERTENSION (ICD-401.9) - controlled on ASA 81mg /d, TOPROL XL 100/d,  AMLODIPINE 10mg /d,  LOSARTAN 100mg /d... BP= 146/88 and feeling OK, tolerating meds well... denies HA, visual changes, CP, palipit, syncope, dyspnea, edema,  etc...  MITRAL VALVE PROLAPSE, HX OF (ICD-V12.50) - notes some intermittent sharp CP & palpit...  HYPERCHOLESTEROLEMIA (ICD-272.0) - on diet + OMEGA-3 Fish Oil; she was tried on Crestor5mg MWF but INTOL w/ severe muscle pain & had to stop (pain resolved off med)... ~  FLP 10/06 by her GYN showed TChol 254, TG 364, HDL 38, LDL 143 ~  FLP 2/10 showed TChol 221, TG 194, HDL 37, LDL 130... rec> better diet, she refuses statin meds. ~  FLP 5/11 showed TChol 220, TG 192, HDL 40, LDL 133 ~  FLP 6/12 showed TChol 261, TG 237, HDL 48, LDL 162... rec refer to Lipid Clinic for management  GERD (ICD-530.81) & ? of BARRETTS ESOPHAGUS (ICD-530.85) - long hx of GERD symptoms and followed by DrGessner for GI... she takes OMEPRAZOLE 20mg /d + GasX etc... last EGD 9/08 showed sm HH and ?1cm segment of Barrett's but bx was neg... therefore routine f/u suggested...   DIVERTICULOSIS, COLON (ICD-562.10) & IRRITABLE BOWEL SYNDROME (ICD-564.1) - she takes DICYCLOMINE 20mg Tid from [employer]...  ~  colonoscopy 11/02 showed mild divertics, otherw neg...  ~  10/09:  f/u GI eval by DrGessner due to symptoms of pain, diarrhea, bloating> colonoscopy was normal & bx= neg, therefore felt to all be c/w IBS...  OSTEOARTHRITIS (ICD-715.90) GOUT (ICD-274.9) FIBROMYALGIA (ICD-729.1) DE QUERVAIN'S TENOSYNOVITIS (ICD-727.04) CHRONIC PAIN SYNDROME (ICD-338.4) - as noted above>  these problems are followed by various specialists including:  DrDeveshwar (Rheum), DrNitka/Caffrey (Ortho), DrHirsh (Neurosurg)...  she is currently taking VICODIN Prn,  ROBAXIN 500mg Tid (uses Prn),  FLEXERIL 10mg - 1-2 Qhs, ALLOPURINOL 300mg - 1.5 tabs daily,  COLCRYS 0.6mg Bid Prn, & NEURONTIN 300mg Tid (she takes Prn)... ~  She reports prev neck surg w/ plate from DrHirsh, and left rotator cuff surg by DrCaffrey... ~  11/10:  she signed a narcotic contract w/ [employer]... ~  2/11:  note from DrDeveshwar reviewed & pain med Rx noted...  VIT D DEFICIENCY>  She  is taking Vit D 50000u weekly ~  Vit D level 2/10 here = 17... rec to start Vit d supplementation....  ATTENTION DEFICIT DISORDER (ICD-314.00) PSYCHIATRIC DISORDER (ICD-300.9) ANXIETY (ICD-300.00) - as noted above>  these problems are followed and treated by [facility])... we do not have notes from these doctors but the pt states that she is prev being treated w/ Adderall, WellbutrinXR, Cymbalta, Seroquel, and Rozerem... ~  2/10:  they make freq adjustments & she indicates that she is off the Adderall & Rozerum now... ~  11/10: she indicates stable on the Wellbutrin, Cymbalta, Seroquel... ~  5/11:  pt indicates that she is stable on WELLBUTRIN XL 300mg /d,  CYMBALTA 60mg Bid, SEROQUEL 300mg - 2Qhs...  PRURITUS (ICD-698.9) - she saw DrWoods w/ Hydroxyzine perscribed...   Past Surgical History  Procedure Date  . Rt  ankle   . Carpal tunnel release   . Rotator cuff repair   . C-spine     Outpatient Encounter Prescriptions as of 11/04/2010  Medication Sig Dispense Refill  . allopurinol (ZYLOPRIM) 300 MG tablet Take 450 mg by mouth daily. Take 1 1/2 tab by mouth daily.       Marland Kitchen  amLODipine (NORVASC) 10 MG tablet Take 10 mg by mouth daily.        Marland Kitchen aspirin 81 MG tablet Take 81 mg by mouth daily.        Marland Kitchen buPROPion (WELLBUTRIN XL) 300 MG 24 hr tablet Take 300 mg by mouth daily.        . colchicine 0.6 MG tablet Take 0.6 mg by mouth daily. Take as directed by Dr. Corliss Skains.       . cyclobenzaprine (FLEXERIL) 10 MG tablet Take 10 mg by mouth at bedtime as needed. Take 1/2 to 1 tab by mouth at bedtime as needed.       . dicyclomine (BENTYL) 20 MG tablet Take 20 mg by mouth 3 (three) times daily as needed.        . DULoxetine (CYMBALTA) 60 MG capsule Take 60 mg by mouth 2 (two) times daily.        . ergocalciferol (VITAMIN D2) 50000 UNITS capsule Take 50,000 Units by mouth once a week.        . fish oil-omega-3 fatty acids 1000 MG capsule Take 2 g by mouth daily.        .  fluticasone (FLONASE) 50 MCG/ACT nasal spray Place 2 sprays into the nose at bedtime.  16 g  11  . gabapentin (NEURONTIN) 300 MG capsule Take 300 mg by mouth 3 (three) times daily as needed.        Marland Kitchen HYDROcodone-acetaminophen (VICODIN) 5-500 MG per tablet Take 1 tablet by mouth every 6 (six) hours as needed.        Marland Kitchen losartan (COZAAR) 100 MG tablet Take 100 mg by mouth daily.        . methocarbamol (ROBAXIN) 500 MG tablet Take 500 mg by mouth 3 (three) times daily as needed. Take as directed by Dr. Corliss Skains.       . metoprolol (TOPROL XL) 100 MG 24 hr tablet Take 100 mg by mouth daily.        Marland Kitchen omeprazole (PRILOSEC) 20 MG capsule TAKE ONE CAPSULE BY MOUTH DAILY  30 capsule  PRN  . QUEtiapine (SEROQUEL) 300 MG tablet Take 600 mg by mouth at bedtime.        . rosuvastatin (CRESTOR) 5 MG tablet Take 1 tablet on Monday, Wednesday and Friday  OFF MED x2wks due to musc pain      No Known Allergies   Current Medications, Allergies, Past Medical History, Past Surgical History, Family History, and Social History were reviewed in [employer] record.    Review of Systems        See HPI - all other systems neg except as noted... The patient complains of right facial swelling & dyspnea on exertion.  The patient denies anorexia, fever, weight loss, weight gain, vision loss, decreased hearing, hoarseness, chest pain, syncope, peripheral edema, prolonged cough, headaches, hemoptysis, abdominal pain, melena, hematochezia, severe indigestion/heartburn, hematuria, incontinence, muscle weakness, suspicious skin lesions, transient blindness, difficulty walking, depression, unusual weight change, abnormal bleeding, enlarged lymph nodes, and angioedema.   Objective:   Physical Exam     WD, WN, 60 y/o BF in NAD... GENERAL:  Alert & oriented; pleasant & cooperative...  HEENT:  Routt/AT, EOM-wnl, PERRLA, EACs-clear, TMs-wnl, NOSE-clear, THROAT-clear, sl dry MMs... NECK:  Supple w/ fairROM; no  JVD; normal carotid impulses w/o bruits; no thyromegaly or nodules palpated; no lymphadenopathy. +swollen right submandib glans w/o obvious sign of stone in duct etc; parotids ok... CHEST:  Clear to P &  A; without wheezes/ rales/ or rhonchi heard... HEART:  Regular Rhythm; without murmurs/ rubs/ or gallops detected...  ABDOMEN:  Soft & nontender; normal bowel sounds; no organomegaly or masses palpated... EXT: without deformities, mult bilat trigger points, mild arthritic changes; no varicose veins/ +venous insuffic/ tr edema. NEURO:  CN's intact; motor testing normal; sensory testing normal; gait normal & balance OK. DERM:  No lesions noted; no rash etc...   Assessment & Plan:   SIALADENITIS>  Right submandib gland sl swollen & min tender; ducts appear intact & "lemon test" is neg w/o incr pain/ swelling; REC> warm compresses, Augmentin 875mg  Bid, Sialogogues w/ lemon drops/ gum/ etc...  HBP>  Controlled on her 3 med regimen, continue same...  CHOL>  She hasn't lost any wt despite diet counseling etc; LC tried Cres5MWF but she proved INTOL w/ severe muscle symptoms that resolved off this med; she has f/u appt soon...  GERD>  follwed by DrGessner for GI on Omeprazole for her reflux symptoms & ?Barrett's...  Divertics/ IBS>  She has Bentyl to use for abd cramping etc...   RHEUM>  DJD/ Gout/ FM/ etc>  Managed by [employer] w/ help from DrCaffrey, DrHirsh, etc;  Pt has narcotic contract w/ DrD & stable on current 6 med regimen noted above...  Psyche>  Followed by Kandis Nab on 3 med regimen & appears stable.Marland KitchenMarland Kitchen

## 2010-11-08 ENCOUNTER — Ambulatory Visit (INDEPENDENT_AMBULATORY_CARE_PROVIDER_SITE_OTHER): Payer: 59

## 2010-11-08 VITALS — BP 150/90 | HR 108 | Wt 182.0 lb

## 2010-11-08 DIAGNOSIS — E7800 Pure hypercholesterolemia, unspecified: Secondary | ICD-10-CM

## 2010-11-08 NOTE — Patient Instructions (Signed)
Great job on the improvement in your cholesterol!   Continue your diet changes you have already made.  Try to cut out all fried foods.   Continue to limit your sweets and limit your servings to only 1 plate   [facility]   Follow up in 2 months.

## 2010-11-08 NOTE — Progress Notes (Signed)
HPI  Tammy Mullins is a pleasant 61 yr old female seen for follow-up evaluation in Lipid Clinic.  At last visit, we started pt on Crestor 3 days weekly.  Unfortunately, not long after starting Crestor she began having muscle pains and discontinued therapy.  She has not had any problems with any muscle pains or aches since.  She continues to take fish oil 2 gm daily.   Discussed pt's diet.  She has made significant improvements in her diet since last visit.  She has stopped drinking soft drinks.  She has cut out most of the french fries and chips as well.  She still eats some sweets but has cut down significantly.  She will only eat ice cream one time per week.  She still likes to go out to dinner with her boyfriend, but is trying to limit her plate to 1 rather than multiple trips to the buffet.  She has increased her vegetables and is trying to eat less fried foods.   Pt's exercise is somewhat limited due to her fibromyalgia, gout and DJD.  She has started walking on the treadmill at her home for 15 minutes 3 days a week.  This has not caused her any problems with fibromyalgia or DJD.  [facility].   Current Outpatient Prescriptions  Medication Sig Dispense Refill  . allopurinol (ZYLOPRIM) 300 MG tablet Take 2 tabs  by mouth daily.      Marland Kitchen amLODipine (NORVASC) 10 MG tablet Take 10 mg by mouth daily.        Marland Kitchen amoxicillin-clavulanate (AUGMENTIN) 875-125 MG per tablet Take 1 tablet by mouth 2 (two) times daily.  14 tablet  0  . aspirin 81 MG tablet Take 81 mg by mouth daily.        Marland Kitchen buPROPion (WELLBUTRIN XL) 300 MG 24 hr tablet Take 300 mg by mouth daily.        . cyclobenzaprine (FLEXERIL) 10 MG tablet Take 1/2 to 1 tab by mouth at bedtime as needed.      . dicyclomine (BENTYL) 20 MG tablet Take 20 mg by mouth 3 (three) times daily as needed.        . DULoxetine (CYMBALTA) 60 MG capsule Take 60 mg by mouth 2 (two) times daily.         . ergocalciferol (VITAMIN D2) 50000 UNITS capsule Take 50,000 Units by mouth once a week.        . fish oil-omega-3 fatty acids 1000 MG capsule Take 2 g by mouth daily.        . fluticasone (FLONASE) 50 MCG/ACT nasal spray Place 2 sprays into the nose at bedtime.  16 g  11  . gabapentin (NEURONTIN) 300 MG capsule Take 300 mg by mouth 3 (three) times daily as needed.        Marland Kitchen HYDROcodone-acetaminophen (VICODIN) 5-500 MG per tablet Take 1 tablet by mouth every 6 (six) hours as needed.        Marland Kitchen losartan (COZAAR) 100 MG tablet Take 100 mg by mouth daily.        . methocarbamol (ROBAXIN) 500 MG tablet Take 500 mg by mouth 3 (three) times daily as needed. Take as directed by Dr. Corliss Skains.       . metoprolol (TOPROL XL) 100 MG 24 hr tablet Take 100 mg by mouth daily.        Marland Kitchen omeprazole (PRILOSEC) 20 MG capsule TAKE  ONE CAPSULE BY MOUTH DAILY  30 capsule  PRN  . QUEtiapine (SEROQUEL) 300 MG tablet Take 600 mg by mouth at bedtime.          Allergies  Allergen Reactions  . Crestor (Rosuvastatin Calcium)     Causes severe body aches

## 2010-11-09 NOTE — Assessment & Plan Note (Signed)
Pt's cholesterol improved without addition of medications.  TC- 224 (goal<200), TG- 189 (goal<150), HDL- 47 (goal>45) and LDL- 141 (goal<130).  LFTs are WNL.  She was unable to tolerate statins.  Given improvements with lifestyle alone, will try to continue down this path rather than adding medications.  Pt has agreed to cut out all fried foods and increase her exercise.  If lipids increase, will need to reconsider pharmacological therapy.  F/U in 2 months.

## 2010-12-15 ENCOUNTER — Other Ambulatory Visit: Payer: Self-pay | Admitting: Obstetrics and Gynecology

## 2010-12-15 DIAGNOSIS — Z1231 Encounter for screening mammogram for malignant neoplasm of breast: Secondary | ICD-10-CM

## 2011-01-12 ENCOUNTER — Other Ambulatory Visit: Payer: 59

## 2011-01-17 ENCOUNTER — Ambulatory Visit: Payer: 59

## 2011-01-25 ENCOUNTER — Ambulatory Visit (HOSPITAL_COMMUNITY)
Admission: RE | Admit: 2011-01-25 | Discharge: 2011-01-25 | Disposition: A | Discharge status: Home / Self Care | Payer: 59 | Source: Ambulatory Visit | Attending: Obstetrics and Gynecology | Admitting: Obstetrics and Gynecology

## 2011-01-25 DIAGNOSIS — Z1231 Encounter for screening mammogram for malignant neoplasm of breast: Secondary | ICD-10-CM | POA: Insufficient documentation

## 2011-02-03 ENCOUNTER — Ambulatory Visit: Payer: 59

## 2011-02-07 ENCOUNTER — Other Ambulatory Visit: Payer: Self-pay | Admitting: Pulmonary Disease

## 2011-02-07 ENCOUNTER — Other Ambulatory Visit (INDEPENDENT_AMBULATORY_CARE_PROVIDER_SITE_OTHER): Payer: 59

## 2011-02-07 DIAGNOSIS — E7800 Pure hypercholesterolemia, unspecified: Secondary | ICD-10-CM

## 2011-02-07 LAB — HEPATIC FUNCTION PANEL
AST: 22 U/L (ref 0–37)
Albumin: 4 g/dL (ref 3.5–5.2)
Alkaline Phosphatase: 84 U/L (ref 39–117)

## 2011-02-07 LAB — LIPID PANEL
Cholesterol: 197 mg/dL (ref 0–200)
Total CHOL/HDL Ratio: 5

## 2011-02-08 ENCOUNTER — Ambulatory Visit: Payer: 59 | Admitting: Pulmonary Disease

## 2011-02-09 ENCOUNTER — Other Ambulatory Visit: Payer: Self-pay | Admitting: Pulmonary Disease

## 2011-02-10 ENCOUNTER — Ambulatory Visit (INDEPENDENT_AMBULATORY_CARE_PROVIDER_SITE_OTHER): Payer: 59

## 2011-02-10 VITALS — BP 158/92 | Wt 184.0 lb

## 2011-02-10 DIAGNOSIS — E7800 Pure hypercholesterolemia, unspecified: Secondary | ICD-10-CM

## 2011-02-10 NOTE — Assessment & Plan Note (Addendum)
Lipid values are as follows:  TC 197, TG 217 (goal <150), HDL 43.1 (goal > 45), LDL 161 (goal <130).  LFTs WNL.  Patient's TG have increased, HDL has decreased.  LDL has improved and is very close to goal.  Patient continues to make small changes to diet.  She has not exercised regularly in >1 month.  Patient has had multiple statin failures and most recently failed crestor TIW.  We will avoid statins at this time and increase fish oil to 4 g daily (vs 2 g).  Will also focus on lifestyle modification with expectations that she can maintain current at-goal LDL and improve TG.   Plan:  Increase Fish Oil to 4 capsules per day.   Continue making healthy dietary choices  Read food labels for "Calories from Fat"  Resume exercising (walking on treadmill) at least 2-3 times per week  Consider joining the water aerobics class at the [facility]  Follow-up in 3 months

## 2011-02-10 NOTE — Patient Instructions (Signed)
1)  Increase Fish Oil to 4 capsules per day.  2)  Continue making healthy dietary choices 3)  Read food labels for "Calories from Fat" 4)  Resume exercising (walking on treadmill) at least 2-3 times per week 5)  Consider joining the water aerobics class at the [facility] 6)  Follow-up in 3 months   Lipid Clinic: Thursday, March 14th @ 11:00 am Labwork: Tuesday, March 12th @ 8:30 am (FASTING)

## 2011-02-10 NOTE — Progress Notes (Signed)
Tammy Mullins returns to lipid clinic for 3 month follow-up of dyslipidemia.  Current lipid regimen includes Fish Oil 2 g daily.  Pt reports compliance and denies any adverse events.    Review of diet reveals patient is attempting to drink more water.  She is also attempting to limit cookies and has limited icecream to only twice per week.  She is also reading food labels on a regular basis and choosing foods that are low in sodium and carbohydrates.  We have discussed choosing foods that are low in "calories from fat".    Exercise is limited due to fibromyalgia, gout, and DJD.  Despite these things, patient is still able to walk on the treadmill 3 d/week; however, she has not done this routinely for at least 1 month.  She is also considering joining water aerobics class at the [facility].    Patient dose not smoke.

## 2011-03-08 ENCOUNTER — Other Ambulatory Visit: Payer: Self-pay | Admitting: Pulmonary Disease

## 2011-04-11 ENCOUNTER — Ambulatory Visit: Payer: 59 | Admitting: Pulmonary Disease

## 2011-05-09 ENCOUNTER — Other Ambulatory Visit: Payer: Self-pay | Admitting: Pulmonary Disease

## 2011-05-09 ENCOUNTER — Other Ambulatory Visit (INDEPENDENT_AMBULATORY_CARE_PROVIDER_SITE_OTHER): Payer: Medicare PPO

## 2011-05-09 ENCOUNTER — Other Ambulatory Visit: Payer: Medicare PPO

## 2011-05-09 DIAGNOSIS — E7800 Pure hypercholesterolemia, unspecified: Secondary | ICD-10-CM

## 2011-05-09 DIAGNOSIS — Z79899 Other long term (current) drug therapy: Secondary | ICD-10-CM

## 2011-05-09 LAB — LIPID PANEL: Triglycerides: 171 mg/dL — ABNORMAL HIGH (ref 0.0–149.0)

## 2011-05-09 LAB — HEPATIC FUNCTION PANEL
ALT: 26 U/L (ref 0–35)
AST: 18 U/L (ref 0–37)
Bilirubin, Direct: 0.1 mg/dL (ref 0.0–0.3)
Total Bilirubin: 0.4 mg/dL (ref 0.3–1.2)

## 2011-05-10 ENCOUNTER — Other Ambulatory Visit: Payer: 59

## 2011-05-12 ENCOUNTER — Ambulatory Visit (INDEPENDENT_AMBULATORY_CARE_PROVIDER_SITE_OTHER): Payer: Medicare PPO | Admitting: Pharmacist

## 2011-05-12 VITALS — BP 143/90 | HR 95 | Wt 181.0 lb

## 2011-05-12 DIAGNOSIS — E7800 Pure hypercholesterolemia, unspecified: Secondary | ICD-10-CM

## 2011-05-12 NOTE — Assessment & Plan Note (Addendum)
CV Risk Assessment: 2 risk factors: HTN, >63YO Goals: TC<200, TG<150, LDL<130, HDL>45 Today's labs: TC 221, TG 171, HDL 41.4, LDL 128, LFTs WNL  Recommendations and  Plan: 1. Eat smaller portions of crackers and cheese/ peanut butter. May replace these snacks with almonds and walnuts. 2. Increase exercise to 3-4 times a week for 30 minutes. 3. Change fish oil 3 tablets/day to 4 tablets/day. 4. Follow up in 6 months.

## 2011-05-12 NOTE — Progress Notes (Signed)
Tammy Mullins returns for her hyperlipidemia follow-up. For lipid management, she is currently only taking fish oil 3 tablets/day and does not report any problems being compliant. She mentions she's tried Crestor but it made her nauseous and she'd rather control her lipids with diet/exercise changes.  Diet: Breakfast: cereal/grits/oatmeal Lunch: soup or tuna and crackers   Dinner: steamed vegetables or eats out (twice/week) and gets a salad or chicken sandwich. Snacks: jello or fruit cups, applesauce, oranges, apples, crackers and cheese or peanut butter. Has decreased french fries, potatoes, cookies, and bread. No red meat. Doesn't drink much soda but a lot of sweet tea. Her friend was advised to lose weight and so she's been encouraging him by preparing healthier meals for them.   Exercise: Walks twice a week for 30 minutes. Has been exercising less due to her friend's recent knee replacement.  Current Outpatient Prescriptions on File Prior to Visit  Medication Sig Dispense Refill  . allopurinol (ZYLOPRIM) 300 MG tablet Take 2 tabs  by mouth daily.      Marland Kitchen amLODipine (NORVASC) 10 MG tablet TAKE 1 TABLET BY MOUTH EVERY DAY  30 tablet  5  . aspirin 81 MG tablet Take 81 mg by mouth daily.        Marland Kitchen buPROPion (WELLBUTRIN XL) 300 MG 24 hr tablet Take 300 mg by mouth daily.        . cyclobenzaprine (FLEXERIL) 10 MG tablet Take 1/2 to 1 tab by mouth at bedtime as needed.      . dicyclomine (BENTYL) 20 MG tablet Take 20 mg by mouth 3 (three) times daily as needed.        . DULoxetine (CYMBALTA) 60 MG capsule Take 60 mg by mouth 2 (two) times daily.        . ergocalciferol (VITAMIN D2) 50000 UNITS capsule Take 50,000 Units by mouth once a week.        . fish oil-omega-3 fatty acids 1000 MG capsule Take 2 g by mouth daily.        . fluticasone (FLONASE) 50 MCG/ACT nasal spray Place 2 sprays into the nose at bedtime.  16 g  11  . gabapentin (NEURONTIN) 300 MG capsule Take 300 mg by mouth 3 (three) times  daily as needed.        Marland Kitchen HYDROcodone-acetaminophen (VICODIN) 5-500 MG per tablet Take 1 tablet by mouth every 6 (six) hours as needed.        Marland Kitchen losartan (COZAAR) 100 MG tablet TAKE 1 TABLET BY MOUTH ONCE DAILY  30 tablet  3  . methocarbamol (ROBAXIN) 500 MG tablet Take 500 mg by mouth 3 (three) times daily as needed. Take as directed by Dr. Corliss Skains.       . metoprolol (TOPROL-XL) 100 MG 24 hr tablet TAKE ONE TABLET BY MOUTH DAILY  30 tablet  3  . omeprazole (PRILOSEC) 20 MG capsule TAKE ONE CAPSULE BY MOUTH DAILY  30 capsule  PRN  . QUEtiapine (SEROQUEL) 300 MG tablet Take 600 mg by mouth at bedtime.          Allergies  Allergen Reactions  . Crestor (Rosuvastatin Calcium)     Causes severe body aches

## 2011-05-12 NOTE — Patient Instructions (Signed)
Try to increase your fish oil to 4 grams (4000mg ) daily.   Cut back on the cheese, peanut butter and crackers.   Use nuts such as almonds and walnuts for a snack.  Set a goal of walking for approximately 30 minutes 3-4 days per week.   We will recheck your labs in 6 months.  We will call to schedule your appt.

## 2011-05-20 ENCOUNTER — Ambulatory Visit: Payer: Self-pay | Admitting: Pulmonary Disease

## 2011-05-23 ENCOUNTER — Ambulatory Visit (INDEPENDENT_AMBULATORY_CARE_PROVIDER_SITE_OTHER): Payer: Medicare PPO | Admitting: Pulmonary Disease

## 2011-05-23 ENCOUNTER — Ambulatory Visit: Payer: Self-pay | Admitting: Pulmonary Disease

## 2011-05-23 ENCOUNTER — Encounter: Payer: Self-pay | Admitting: Pulmonary Disease

## 2011-05-23 VITALS — BP 138/98 | HR 91 | Temp 97.8°F | Ht 65.0 in | Wt 181.2 lb

## 2011-05-23 DIAGNOSIS — K589 Irritable bowel syndrome without diarrhea: Secondary | ICD-10-CM

## 2011-05-23 DIAGNOSIS — K5730 Diverticulosis of large intestine without perforation or abscess without bleeding: Secondary | ICD-10-CM

## 2011-05-23 DIAGNOSIS — F411 Generalized anxiety disorder: Secondary | ICD-10-CM

## 2011-05-23 DIAGNOSIS — IMO0001 Reserved for inherently not codable concepts without codable children: Secondary | ICD-10-CM

## 2011-05-23 DIAGNOSIS — K219 Gastro-esophageal reflux disease without esophagitis: Secondary | ICD-10-CM

## 2011-05-23 DIAGNOSIS — M109 Gout, unspecified: Secondary | ICD-10-CM

## 2011-05-23 DIAGNOSIS — E7800 Pure hypercholesterolemia, unspecified: Secondary | ICD-10-CM

## 2011-05-23 DIAGNOSIS — I10 Essential (primary) hypertension: Secondary | ICD-10-CM

## 2011-05-23 DIAGNOSIS — G894 Chronic pain syndrome: Secondary | ICD-10-CM

## 2011-05-23 DIAGNOSIS — M1990 Unspecified osteoarthritis, unspecified site: Secondary | ICD-10-CM

## 2011-05-23 DIAGNOSIS — Z8679 Personal history of other diseases of the circulatory system: Secondary | ICD-10-CM

## 2011-05-23 MED ORDER — LOSARTAN POTASSIUM 100 MG PO TABS: 100.0000 mg | ORAL_TABLET | Freq: Every day | ORAL | Status: DC

## 2011-05-23 MED ORDER — FLUTICASONE PROPIONATE 50 MCG/ACT NA SUSP: 2.0000 | Freq: Every day | NASAL | Status: DC

## 2011-05-23 MED ORDER — AMLODIPINE BESYLATE 10 MG PO TABS: 10.0000 mg | ORAL_TABLET | Freq: Every day | ORAL | Status: DC

## 2011-05-23 MED ORDER — METOPROLOL SUCCINATE ER 100 MG PO TB24: 100.0000 mg | ORAL_TABLET | Freq: Every day | ORAL | Status: DC

## 2011-05-23 NOTE — Patient Instructions (Signed)
Today we updated your med list in our EPIC system...    Continue your current medications the same...  We refilled the meds you requested today...  Let's get on track w/ our diet & exercise program> the goal is to lose 10-15 lbs...  Continue your BP meds & remember: NO SALT...  Call for any problems...  Let's plan a follow up visit w/ routine lab work in 3 months.Marland KitchenMarland Kitchen

## 2011-05-23 NOTE — Progress Notes (Addendum)
Subjective:    Patient ID: Tammy Mullins, female    DOB: [birth year], 61 y.o.   MRN: [id number]  HPI 61 y/o BF here for a follow up visit... she has multiple medical problems as noted below...   ~  we have prev filled out disability papers at her request-  she is disabled due to multiple medical problems and DJD/ Fibromyalgia/ Chronic Pain syndrome/ and Psychiatric issues... these problems have been evaluated and are followed by mult other practitioners including: DrDeveshwar (Rheum), DrNitka (Ortho), DrHirsh (Neurosurg), and [facility] (LisaPoulas)...  ~  Jul 09, 2009:  Stable overall>  saw DrDeveshwar 2/11 for f/u FM, generalized pain, hx gout, right knee problems> on Allopurinol, Colcrys, Vicodin, Robaxin, Neurontin (they signed narcotic contract)... she also f/u w/ Psychiatry on Wellbutrin, Cymbalta, Seroquel... BP controlled as below;  Chol is fair but she doesn't want statin Rx;  GI stable w/ acid reflux & IBS... recent labs reviewed w/ pt.  ~  January 06, 2010:  she's had a reasonably good 255mo> but continues w/ her chronic problems including aching/ sore from the [facility] etc & she continues to f/u w/ DrDeveshwar for Rheum- meds unchanged as below... she also maintains regular f/u w/ LisaPoulas/ Psychiatry- maintained on the same 3 meds as noted... from the medical standpoint> BP controlled, remains on diet for chol etc, & GI system is stable on her Prn meds as listed... OK Flu vaccine today> we discussed continue current regimen & f/u OV w/ fasting labs in 35mo.  ~  August 10, 2010:  45mo ROV & she is c/o some left shoulder pain being treated by her Rheumatologist DrDeveshwar & she brings in a note requesting labs to be drawn & sent to Rheum for review;  She continues on 6 medication regimen from Rheum/ Ortho including Vicodin, Robaxin/ Flexeril, Allopurinol, Colcrys, & Neurontin; she has a narcotic contract w/ DrDeveshwar;  otherw she reports stable> notes some ongoing nasal allergy  symptoms & she would like her Flonase refilled; BP has been controlled on 3 med regimen & tolerated well; she has been trying to control her Lipids w/ diet & FishOil but current FLP is worse & we will rec referral to the Lipid clinic for their input; GI managed by DrGessner & she remains on Omeprazole & Bentyl; Psychiatric issues managed by DrPoulas on 3 meds- Wellbutrin, Cymbalta, Seroquel...  ~  November 04, 2010:  55mo ROV & add-on for right facial swelling noted today> states swollen area right mandible & below jawline, min tender, no redness, denies dental prob, no pain chewing, but has some dry mouth; exam showed right submandib gland swelling, min tender, cannot see duct abn or obvious stone; discussed rx w/ warm compresses, sialogogues, fluids, & we will cover w/ Augmentin for completeness...  >Her BP is controlled on Metoprolol, Amlodipine, Losartan; BP= 146/88 & usually better at home (she didn't take meds today); denies CP, palpit, syncope, edema, etc...  >Chol is treated w/ diet & FishOil; she was seen by Lipid Clinic & tried on Cres5MWF but she developed incapacitating muscle pain "every muscle in my body" so she stopped the Crestor & the symptoms resolved; she has f/u w/ LC soon...  >She has GERD & Barrett's esoph on Omep & gas meds;  Divertics & IBS on Bentyl from DrGessner; stable & doing reasonably well...  >She is followed by Rheum, Ortho, Neurosurg for her DJD, Gout, FM, chr pain syndrome...  >She is still on Vit D 50K weekly.  >She continues  under the care of [facility], drPoulas on Wellbutrin, Cymbalta, Seroquel.  ~  May 23, 2011:  12mo ROV & Tammy Mullins is c/o her FM & HAs noting "my FM is getting worse"; as noted she is followed by Ortho, Rheum, & Neurosurg for her DJD, gout, FM, & chronic pain> on Allopurinol, Vicodin, Robaxin/ Flexeril, Neurontin, Cymbalta; plus Psyche- DrPoulas- on Wellbutrin & Seroquel;  She is encouraged to start a program of gentle stretching exercises  etc...    BP controlled on Metop, Amlod, Losar; she is followed in the Lipid Clinic... See prob list below>>   Problem List:  ALLERGIC RHINITIS (ICD-477.9) - she uses OTC antihistamines, mucinex, saline and FLONASE for her sinuses...  Hx SIALADENITIS>  Presented w/ right submandib gland swollen & min tender 9/12; ducts appear intact & "lemon test" was neg w/o incr pain/ swelling; treated w/ warm compresses, Augmentin 875mg  Bid, Sialogogues w/ lemon drops/ gum/ etc...  HYPERTENSION (ICD-401.9) - controlled on ASA 81mg /d, TOPROL XL 100/d,  AMLODIPINE 10mg /d,  LOSARTAN 100mg /d...  ~  9/12: BP= 146/88 and feeling OK, tolerating meds well... denies HA, visual changes, CP, palipit, syncope, dyspnea, edema, etc... ~  3/13: BP= 138/98 & she is reminded to take meds regularly every day, no salt, & work on weight reduction...  MITRAL VALVE PROLAPSE, HX OF (ICD-V12.50) - notes some intermittent sharp CP & palpit...  HYPERCHOLESTEROLEMIA (ICD-272.0) - on diet + OMEGA-3 Fish Oil; she was tried on Crestor5mg MWF but INTOL w/ severe muscle pain & had to stop (pain resolved off med)... ~  FLP 10/06 by her GYN showed TChol 254, TG 364, HDL 38, LDL 143 ~  FLP 2/10 showed TChol 221, TG 194, HDL 37, LDL 130... rec> better diet, she refuses statin meds. ~  FLP 5/11 showed TChol 220, TG 192, HDL 40, LDL 133 ~  FLP 6/12 showed TChol 261, TG 237, HDL 48, LDL 162... rec refer to Lipid Clinic for management ~  [facility] Clinic has been working w/ her & checking FLP Q10mo- see flow chart... ~  FLP 3/13 on diet + FishOil showed TChol 221, TG 171, HDL 41, LDL 128... "it's the crackers & the cheese" she says...  GERD (ICD-530.81) & ? of BARRETTS ESOPHAGUS (ICD-530.85) - long hx of GERD symptoms and followed by DrGessner for GI... she takes OMEPRAZOLE 20mg /d + GasX etc... last EGD 9/08 showed sm HH and ?1cm segment of Barrett's but bx was neg... therefore routine f/u suggested...   DIVERTICULOSIS, COLON (ICD-562.10) &  IRRITABLE BOWEL SYNDROME (ICD-564.1) - she takes DICYCLOMINE 20mg Tid from [employer]...  ~  colonoscopy 11/02 showed mild divertics, otherw neg...  ~  10/09:  f/u GI eval by DrGessner due to symptoms of pain, diarrhea, bloating> colonoscopy was normal & bx= neg, therefore felt to all be c/w IBS...  OSTEOARTHRITIS (ICD-715.90) GOUT (ICD-274.9) FIBROMYALGIA (ICD-729.1) DE QUERVAIN'S TENOSYNOVITIS (ICD-727.04) CHRONIC PAIN SYNDROME (ICD-338.4) - as noted above>  these problems are followed by various specialists including:  DrDeveshwar (Rheum), DrNitka/Caffrey (Ortho), DrHirsh (Neurosurg)...  she is currently taking VICODIN Prn,  ROBAXIN 500mg Tid (uses Prn),  FLEXERIL 10mg - 1-2 Qhs, ALLOPURINOL 300mg - 1.5 tabs daily,  COLCRYS 0.6mg Bid Prn, & NEURONTIN 300mg Tid (she takes Prn)... ~  She reports prev neck surg w/ plate from DrHirsh, and left rotator cuff surg by DrCaffrey... ~  11/10:  she signed a narcotic contract w/ Ec[employer]... ~  2/11:  note from DrDeveshwar reviewed & pain med Rx noted...  VIT D DEFICIENCY>  She is taking Vit D 50000u weekly ~  Vit D level 2/10 here = 17... rec to start Vit d supplementation... ~  BMD 11/10 by DrGottsegen showed TScores -0.3 in Spine, and -1.4 in left FemNeck  ATTENTION DEFICIT DISORDER (ICD-314.00) PSYCHIATRIC DISORDER (ICD-300.9) ANXIETY (ICD-300.00) - as noted above>  these problems are followed and treated by [facility])... we do not have notes from these doctors but the pt states that she is prev being treated w/ Adderall, WellbutrinXR, Cymbalta, Seroquel, and Rozerem... ~  2/10:  they make freq adjustments & she indicates that she is off the Adderall & Rozerum now... ~  11/10: she indicates stable on the Wellbutrin, Cymbalta, Seroquel... ~  5/11:  pt indicates that she is stable on WELLBUTRIN XL 300mg /d,  CYMBALTA 60mg Bid, SEROQUEL 300mg - 2Qhs...  PRURITUS (ICD-698.9) - she saw DrWoods w/ Hydroxyzine perscribed...   Past  Surgical History  Procedure Date  . Rt  ankle   . Carpal tunnel release   . Rotator cuff repair   . C-spine     Outpatient Encounter Prescriptions as of 05/23/2011  Medication Sig Dispense Refill  . allopurinol (ZYLOPRIM) 300 MG tablet Take 2 tabs  by mouth daily.      Marland Kitchen amLODipine (NORVASC) 10 MG tablet TAKE 1 TABLET BY MOUTH EVERY DAY  30 tablet  5  . aspirin 81 MG tablet Take 81 mg by mouth daily.        Marland Kitchen buPROPion (WELLBUTRIN XL) 300 MG 24 hr tablet Take 300 mg by mouth daily.        . cyclobenzaprine (FLEXERIL) 10 MG tablet Take 1/2 to 1 tab by mouth at bedtime as needed.      . dicyclomine (BENTYL) 20 MG tablet Take 20 mg by mouth 3 (three) times daily as needed.        . DULoxetine (CYMBALTA) 60 MG capsule Take 60 mg by mouth 2 (two) times daily.        . ergocalciferol (VITAMIN D2) 50000 UNITS capsule Take 50,000 Units by mouth once a week.        . fish oil-omega-3 fatty acids 1000 MG capsule Take 2 g by mouth daily.        . fluticasone (FLONASE) 50 MCG/ACT nasal spray Place 2 sprays into the nose at bedtime.  16 g  11  . gabapentin (NEURONTIN) 300 MG capsule Take 300 mg by mouth 3 (three) times daily as needed.        Marland Kitchen HYDROcodone-acetaminophen (VICODIN) 5-500 MG per tablet Take 1 tablet by mouth every 6 (six) hours as needed.        Marland Kitchen losartan (COZAAR) 100 MG tablet TAKE 1 TABLET BY MOUTH ONCE DAILY  30 tablet  3  . methocarbamol (ROBAXIN) 500 MG tablet Take 500 mg by mouth 3 (three) times daily as needed. Take as directed by Dr. Corliss Skains.       . metoprolol (TOPROL-XL) 100 MG 24 hr tablet TAKE ONE TABLET BY MOUTH DAILY  30 tablet  3  . omeprazole (PRILOSEC) 20 MG capsule TAKE ONE CAPSULE BY MOUTH DAILY  30 capsule  PRN  . QUEtiapine (SEROQUEL) 300 MG tablet Take 600 mg by mouth at bedtime.          Allergies  Allergen Reactions  . Crestor (Rosuvastatin Calcium)     Causes severe body aches    Current Medications, Allergies, Past Medical History, Past Surgical  History, Family History, and Social History were reviewed in [employer] record.  Review of Systems        See HPI - all other systems neg except as noted... The patient complains of right facial swelling & dyspnea on exertion.  The patient denies anorexia, fever, weight loss, weight gain, vision loss, decreased hearing, hoarseness, chest pain, syncope, peripheral edema, prolonged cough, headaches, hemoptysis, abdominal pain, melena, hematochezia, severe indigestion/heartburn, hematuria, incontinence, muscle weakness, suspicious skin lesions, transient blindness, difficulty walking, depression, unusual weight change, abnormal bleeding, enlarged lymph nodes, and angioedema.   Objective:   Physical Exam     WD, WN, 60 y/o BF in NAD... GENERAL:  Alert & oriented; pleasant & cooperative...  HEENT:  [place]/AT, EOM-wnl, PERRLA, EACs-clear, TMs-wnl, NOSE-clear, THROAT-clear, sl dry MMs... NECK:  Supple w/ fairROM; no JVD; normal carotid impulses w/o bruits; no thyromegaly or nodules palpated; no lymphadenopathy. +swollen right submandib glans w/o obvious sign of stone in duct etc; parotids ok... CHEST:  Clear to P & A; without wheezes/ rales/ or rhonchi heard... HEART:  Regular Rhythm; without murmurs/ rubs/ or gallops detected...  ABDOMEN:  Soft & nontender; normal bowel sounds; no organomegaly or masses palpated... EXT: without deformities, mult bilat trigger points, mild arthritic changes; no varicose veins/ +venous insuffic/ tr edema. NEURO:  CN's intact; motor testing normal; sensory testing normal; gait normal & balance OK. DERM:  No lesions noted; no rash etc...  RADIOLOGY DATA:  Reviewed in the EPIC EMR & discussed w/ the patient...  LABORATORY DATA:  Reviewed in the EPIC EMR & discussed w/ the patient...   Assessment & Plan:    HBP>  Controlled on her 3 med regimen, continue same...  CHOL>  She hasn't lost any wt despite diet counseling etc; LC tried Cres5MWF  but she proved INTOL w/ severe muscle symptoms that resolved off this med; she continues to f/u w/ LC every 40mo...  GERD>  follwed by DrGessner for GI on Omeprazole for her reflux symptoms & ?Barrett's...  Divertics/ IBS>  She has Bentyl to use for abd cramping etc...   RHEUM>  DJD/ Gout/ FM/ etc>  Managed by [employer] w/ help from DrCaffrey, DrHirsh, etc;  Pt has narcotic contract w/ DrD & stable on current 6 med regimen noted above...  Psyche>  Followed by Kandis Nab on 3 med regimen & appears stable.Marland KitchenMarland Kitchen

## 2011-07-11 ENCOUNTER — Telehealth: Payer: Self-pay | Admitting: Pulmonary Disease

## 2011-07-11 MED ORDER — AZITHROMYCIN 250 MG PO TABS: ORAL_TABLET | ORAL | Status: AC

## 2011-07-11 NOTE — Telephone Encounter (Signed)
I spoke with pt and she c/o severe dry cough, sore throat, runny nose, HA, chest congestion, feels hot (? Fever), chills x Friday. She has been taking mucinex and it has helped. Pt is requesting to have something called in for this. Please advise SN thanks . Allergies  Allergen Reactions  . Crestor (Rosuvastatin Calcium)     Causes severe body aches     [facility]

## 2011-07-11 NOTE — Telephone Encounter (Signed)
Per SN---ok to send in rx for zpak #1  Take as directed with no refills.  This has been sent to the pts pharmacy and pt is aware.

## 2011-07-14 ENCOUNTER — Telehealth: Payer: Self-pay | Admitting: Pulmonary Disease

## 2011-07-14 MED ORDER — MOXIFLOXACIN HCL 400 MG PO TABS: 400.0000 mg | ORAL_TABLET | Freq: Every day | ORAL | Status: AC

## 2011-07-14 NOTE — Telephone Encounter (Signed)
Per SN---ok to send in avelox 400mg   #7  1 daily.  This has been sent in to the pharmacy and pt can take mucinex 600mg   2 po bid with plenty of fluids to help with all the congestion.  Pt voiced her understanding of these recs.

## 2011-07-14 NOTE — Telephone Encounter (Signed)
Pt is requesting recs --she still having cough with thick yellow sputum--and only 1 day left of zpak---needs further recs for this.  SN please advise. thanks

## 2011-08-23 ENCOUNTER — Ambulatory Visit: Payer: Medicare PPO | Admitting: Pulmonary Disease

## 2011-08-25 ENCOUNTER — Other Ambulatory Visit: Payer: Self-pay | Admitting: Pulmonary Disease

## 2011-10-05 ENCOUNTER — Ambulatory Visit (INDEPENDENT_AMBULATORY_CARE_PROVIDER_SITE_OTHER): Payer: Medicare PPO | Admitting: Pulmonary Disease

## 2011-10-05 ENCOUNTER — Encounter: Payer: Self-pay | Admitting: Pulmonary Disease

## 2011-10-05 VITALS — BP 146/92 | HR 92 | Temp 97.7°F | Ht 65.0 in | Wt 177.8 lb

## 2011-10-05 DIAGNOSIS — K219 Gastro-esophageal reflux disease without esophagitis: Secondary | ICD-10-CM

## 2011-10-05 DIAGNOSIS — E7800 Pure hypercholesterolemia, unspecified: Secondary | ICD-10-CM

## 2011-10-05 DIAGNOSIS — M1990 Unspecified osteoarthritis, unspecified site: Secondary | ICD-10-CM

## 2011-10-05 DIAGNOSIS — G894 Chronic pain syndrome: Secondary | ICD-10-CM

## 2011-10-05 DIAGNOSIS — K5730 Diverticulosis of large intestine without perforation or abscess without bleeding: Secondary | ICD-10-CM

## 2011-10-05 DIAGNOSIS — F489 Nonpsychotic mental disorder, unspecified: Secondary | ICD-10-CM

## 2011-10-05 DIAGNOSIS — F411 Generalized anxiety disorder: Secondary | ICD-10-CM

## 2011-10-05 DIAGNOSIS — IMO0001 Reserved for inherently not codable concepts without codable children: Secondary | ICD-10-CM

## 2011-10-05 DIAGNOSIS — I10 Essential (primary) hypertension: Secondary | ICD-10-CM

## 2011-10-05 DIAGNOSIS — K589 Irritable bowel syndrome without diarrhea: Secondary | ICD-10-CM

## 2011-10-05 NOTE — Progress Notes (Signed)
Subjective:    Patient ID: Tammy Mullins, female    DOB: 1950-09-27, 61 y.o.   MRN: [id number]  HPI 61 y/o BF here for a follow up visit... she has multiple medical problems as noted below...   ~  we have prev filled out disability papers at her request-  she is disabled due to multiple medical problems and DJD/ Fibromyalgia/ Chronic Pain syndrome/ and Psychiatric issues... these problems have been evaluated and are followed by mult other practitioners including: DrDeveshwar (Rheum), DrNitka (Ortho), DrHirsh (Neurosurg), and [facility] (LisaPoulas)...  ~  Jul 09, 2009:  Stable overall>  saw DrDeveshwar 2/11 for f/u FM, generalized pain, hx gout, right knee problems> on Allopurinol, Colcrys, Vicodin, Robaxin, Neurontin (they signed narcotic contract)... she also f/u w/ Psychiatry on Wellbutrin, Cymbalta, Seroquel... BP controlled as below;  Chol is fair but she doesn't want statin Rx;  GI stable w/ acid reflux & IBS... recent labs reviewed w/ pt.  ~  January 06, 2010:  she's had a reasonably good 63mo> but continues w/ her chronic problems including aching/ sore from the [facility] etc & she continues to f/u w/ DrDeveshwar for Rheum- meds unchanged as below... she also maintains regular f/u w/ LisaPoulas/ Psychiatry- maintained on the same 3 meds as noted... from the medical standpoint> BP controlled, remains on diet for chol etc, & GI system is stable on her Prn meds as listed... OK Flu vaccine today> we discussed continue current regimen & f/u OV w/ fasting labs in 59mo.  ~  August 10, 2010:  63mo ROV & she is c/o some left shoulder pain being treated by her Rheumatologist DrDeveshwar & she brings in a note requesting labs to be drawn & sent to Rheum for review;  She continues on 6 medication regimen from Rheum/ Ortho including Vicodin, Robaxin/ Flexeril, Allopurinol, Colcrys, & Neurontin; she has a narcotic contract w/ DrDeveshwar;  otherw she reports stable> notes some ongoing nasal allergy  symptoms & she would like her Flonase refilled; BP has been controlled on 3 med regimen & tolerated well; she has been trying to control her Lipids w/ diet & FishOil but current FLP is worse & we will rec referral to the Lipid clinic for their input; GI managed by DrGessner & she remains on Omeprazole & Bentyl; Psychiatric issues managed by DrPoulas on 3 meds- Wellbutrin, Cymbalta, Seroquel...  ~  November 04, 2010:  63mo ROV & add-on for right facial swelling noted today> states swollen area right mandible & below jawline, min tender, no redness, denies dental prob, no pain chewing, but has some dry mouth; exam showed right submandib gland swelling, min tender, cannot see duct abn or obvious stone; discussed rx w/ warm compresses, sialogogues, fluids, & we will cover w/ Augmentin for completeness...   >Her BP is controlled on Metoprolol, Amlodipine, Losartan; BP= 146/88 & usually better at home (she didn't take meds today); denies CP, palpit, syncope, edema, etc...   >Chol is treated w/ diet & FishOil; she was seen by [facility] Clinic & tried on Cres5MWF but she developed incapacitating muscle pain "every muscle in my body" so she stopped the Crestor & the symptoms resolved; she has f/u w/ LC soon...   >She has GERD & Barrett's esoph on Omep & gas meds;  Divertics & IBS on Bentyl from DrGessner; stable & doing reasonably well...   >She is followed by Rheum, Ortho, Neurosurg for her DJD, Gout, FM, chr pain syndrome...   >She is still on Vit D  50K weekly.   >She continues under the care of [facility], drPoulas on Wellbutrin, Cymbalta, Seroquel.  ~  May 23, 2011:  63mo ROV & [name] is c/o her FM & HAs noting "my FM is getting worse"; as noted she is followed by Ortho, Rheum, & Neurosurg for her DJD, gout, FM, & chronic pain> on Allopurinol, Vicodin, Robaxin/ Flexeril, Neurontin, Cymbalta; plus Psyche- DrPoulas- on Wellbutrin & Seroquel;  She is encouraged to start a program of gentle stretching exercises  etc...    BP controlled on Metop, Amlod, Losar; she is followed in the Lipid Clinic... See prob list below>>  ~  October 05, 2011:  63mo ROV & [name] is c/o discomfort in her back, shoulders, wrist, etc "I feel so bad" she says & doesn't want to get out of bed; we reviewed her meds- Neurontin, Vicodin, Flexeril/ Robaxin, Allopurinol, MVI; and our last note from DrDeveshwar 10/12 (FM, Gout, DJD, insomnia & fatigue)- they added Cymbalta; encouraged pt to maintain close f/u w/ her Rheumatologist for her condition...    >HBP treated w/ Metop, Amlodip, Losartan> BP= 146/92 & encouraged to take meds every day & work on wt reduction...    >Hx MVP but she denies CP (other rhan FM discomfort), palpit, SOB, edema, etc...    >Trying to control Chol via the Lipid Clinic w/ FishOil + diet/exerc; she tried Crestor but states it made her nauseated & she refuses to take it even in low dose; last FLP 3/13 not at goals- she declines med rx.Marland Kitchen    >GI> on Omep & Bentyl; followed by [employer]; states she's doing OK w/o abd pain, n/v, c/d, blood in stool...    >She is still followed by Lindaann Slough for Psychiatry> she remains on Wellbutrin, Seroquel, Cymbalta... We reviewed prob list, meds, xrays and labs> see below fort updates >>   Problem List:  ALLERGIC RHINITIS (ICD-477.9) - she uses OTC antihistamines, mucinex, saline and FLONASE for her sinuses...  Hx SIALADENITIS>  Presented w/ right submandib gland swollen & min tender 9/12; ducts appear intact & "lemon test" was neg w/o incr pain/ swelling; treated w/ warm compresses, Augmentin 875mg  Bid, Sialogogues w/ lemon drops/ gum/ etc...  HYPERTENSION (ICD-401.9) - on ASA 81mg /d, TOPROL XL 100/d,  AMLODIPINE 10mg /d,  LOSARTAN 100mg /d...  ~  9/12: BP= 146/88 and feeling OK, tolerating meds well... denies HA, visual changes, CP, palipit, syncope, dyspnea, edema, etc... ~  3/13: BP= 138/98 & she is reminded to take meds regularly every day, no salt, & work on weight  reduction... ~  8/13:  BP= 146/92 & supposed to be on her regularly...  MITRAL VALVE PROLAPSE, HX OF (ICD-V12.50) - notes some intermittent sharp CP, but denies palpit/ ch in SOB/ edema/ etc...  HYPERCHOLESTEROLEMIA (ICD-272.0) - on diet + OMEGA-3 Fish Oil; she was tried on Crestor5mg MWF but INTOL w/ severe muscle pain & had to stop (pain resolved off med)... ~  FLP 10/06 by her GYN showed TChol 254, TG 364, HDL 38, LDL 143 ~  FLP 2/10 showed TChol 221, TG 194, HDL 37, LDL 130... rec> better diet, she refuses statin meds. ~  FLP 5/11 showed TChol 220, TG 192, HDL 40, LDL 133 ~  FLP 6/12 showed TChol 261, TG 237, HDL 48, LDL 162... rec refer to Lipid Clinic for management ~  [facility] Clinic has been working w/ her & checking FLP Q47mo- see flow chart... ~  FLP 3/13 on diet + FishOil showed TChol 221, TG 171, HDL 41,  LDL 128... "it's the crackers & the cheese" she says...  GERD (ICD-530.81) & ? of BARRETTS ESOPHAGUS (ICD-530.85) - long hx of GERD symptoms and followed by DrGessner for GI... she takes OMEPRAZOLE 20mg /d + GasX etc...  ~  last EGD 9/08 showed sm HH and ?1cm segment of Barrett's but bx was neg... therefore routine f/u suggested...   DIVERTICULOSIS, COLON (ICD-562.10) & IRRITABLE BOWEL SYNDROME (ICD-564.1) - she takes DICYCLOMINE 20mg Tid from [employer]...  ~  colonoscopy 11/02 showed mild divertics, otherw neg...  ~  10/09:  f/u GI eval by DrGessner due to symptoms of pain, diarrhea, bloating> colonoscopy was normal & bx= neg, therefore felt to all be c/w IBS...  OSTEOARTHRITIS (ICD-715.90) GOUT (ICD-274.9) FIBROMYALGIA (ICD-729.1) DE QUERVAIN'S TENOSYNOVITIS (ICD-727.04) CHRONIC PAIN SYNDROME (ICD-338.4) - as noted above>  these problems are followed by various specialists including:  DrDeveshwar (Rheum), DrNitka/Caffrey (Ortho), DrHirsh (Neurosurg)...  she is currently taking VICODIN Prn,  ROBAXIN 500mg Tid (uses Prn),  FLEXERIL 10mg - 1-2 Qhs, ALLOPURINOL 300mg - 1.5 tabs daily,   COLCRYS 0.6mg Bid Prn, & NEURONTIN 300mg Tid (she takes Prn)... ~  She reports prev neck surg w/ plate from DrHirsh, and left rotator cuff surg by DrCaffrey... ~  11/10:  she signed a narcotic contract w/ [employer]... ~  2/11:  note from DrDeveshwar reviewed & pain med Rx noted... ~  10/12:  Note from DrDeveshwar reviewed> FM, Gout, DJD...  VIT D DEFICIENCY>  She is taking Vit D 50000u weekly ~  Vit D level 2/10 here = 17... rec to start Vit d supplementation... ~  BMD 11/10 by DrGottsegen showed TScores -0.3 in Spine, and -1.4 in left FemNeck ~  8/13:  She had stopped her VitD 50K weekly supplement, she's not sure why?, needs f/u VitD level & decision re restart...  ATTENTION DEFICIT DISORDER (ICD-314.00) PSYCHIATRIC DISORDER (ICD-300.9) ANXIETY (ICD-300.00) - as noted above>  these problems are followed and treated by [facility])... we do not have notes from these doctors but the pt states that she is prev being treated w/ Adderall, WellbutrinXR, Cymbalta, Seroquel, and Rozerem... ~  2/10:  they make freq adjustments & she indicates that she is off the Adderall & Rozerum now... ~  11/10: she indicates stable on the Wellbutrin, Cymbalta, Seroquel... ~  5/11:  pt indicates that she is stable on WELLBUTRIN XL 300mg /d,  CYMBALTA 60mg Bid, SEROQUEL 300mg - 2Qhs...  PRURITUS (ICD-698.9) - she saw DrWoods w/ Hydroxyzine perscribed...   Past Surgical History  Procedure Date  . Rt  ankle   . Carpal tunnel release   . Rotator cuff repair   . C-spine     Outpatient Encounter Prescriptions as of 10/05/2011  Medication Sig Dispense Refill  . allopurinol (ZYLOPRIM) 300 MG tablet Take 2 tabs  by mouth daily.      Marland Kitchen amLODipine (NORVASC) 10 MG tablet TAKE 1 TABLET DAILY  90 tablet  3  . aspirin 81 MG tablet Take 81 mg by mouth daily.        Marland Kitchen buPROPion (WELLBUTRIN XL) 300 MG 24 hr tablet Take 300 mg by mouth daily.        . cyclobenzaprine (FLEXERIL) 10 MG tablet Take 1/2  to 1 tab by mouth at bedtime as needed.      . dicyclomine (BENTYL) 20 MG tablet Take 20 mg by mouth 3 (three) times daily as needed.        . DULoxetine (CYMBALTA) 60 MG capsule Take 60 mg by mouth 2 (two) times daily.        Marland Kitchen  fish oil-omega-3 fatty acids 1000 MG capsule Take 2 capsules daily      . fluticasone (FLONASE) 50 MCG/ACT nasal spray Place 2 sprays into the nose at bedtime.  16 g  11  . gabapentin (NEURONTIN) 300 MG capsule Take 300 mg by mouth 3 (three) times daily as needed.        Marland Kitchen HYDROcodone-acetaminophen (VICODIN) 5-500 MG per tablet Take 1 tablet by mouth every 6 (six) hours as needed.        Marland Kitchen losartan (COZAAR) 100 MG tablet Take 1 tablet (100 mg total) by mouth daily.  30 tablet  11  . methocarbamol (ROBAXIN) 500 MG tablet Take 500 mg by mouth 3 (three) times daily as needed. Take as directed by Dr. Corliss Skains.       . metoprolol succinate (TOPROL-XL) 100 MG 24 hr tablet TAKE 1 TABLET DAILY  90 tablet  3  . Multiple Vitamins-Minerals (WOMENS MULTIVITAMIN PLUS PO) Take 1 tablet by mouth daily.      Marland Kitchen omeprazole (PRILOSEC) 20 MG capsule TAKE 1 CAPSULE DAILY  90 capsule  3  . QUEtiapine (SEROQUEL) 300 MG tablet Take 600 mg by mouth at bedtime.        Marland Kitchen DISCONTD: ergocalciferol (VITAMIN D2) 50000 UNITS capsule Take 50,000 Units by mouth once a week.          Allergies  Allergen Reactions  . Crestor (Rosuvastatin Calcium)     Causes severe body aches    Current Medications, Allergies, Past Medical History, Past Surgical History, Family History, and Social History were reviewed in [employer] record.    Review of Systems        See HPI - all other systems neg except as noted... The patient complains of right facial swelling & dyspnea on exertion.  The patient denies anorexia, fever, weight loss, weight gain, vision loss, decreased hearing, hoarseness, chest pain, syncope, peripheral edema, prolonged cough, headaches, hemoptysis, abdominal pain,  melena, hematochezia, severe indigestion/heartburn, hematuria, incontinence, muscle weakness, suspicious skin lesions, transient blindness, difficulty walking, depression, unusual weight change, abnormal bleeding, enlarged lymph nodes, and angioedema.   Objective:   Physical Exam     WD, WN, 61 y/o BF in NAD... GENERAL:  Alert & oriented; pleasant & cooperative...  HEENT:  [place]/AT, EOM-wnl, PERRLA, EACs-clear, TMs-wnl, NOSE-clear, THROAT-clear, sl dry MMs... NECK:  Supple w/ fairROM; no JVD; normal carotid impulses w/o bruits; no thyromegaly or nodules palpated; no lymphadenopathy. +swollen right submandib glans w/o obvious sign of stone in duct etc; parotids ok... CHEST:  Clear to P & A; without wheezes/ rales/ or rhonchi heard... HEART:  Regular Rhythm; without murmurs/ rubs/ or gallops detected...  ABDOMEN:  Soft & nontender; normal bowel sounds; no organomegaly or masses palpated... EXT: without deformities, mult bilat trigger points, mild arthritic changes; no varicose veins/ +venous insuffic/ tr edema. NEURO:  CN's intact; motor testing normal; sensory testing normal; gait normal & balance OK. DERM:  No lesions noted; no rash etc...  RADIOLOGY DATA:  Reviewed in the EPIC EMR & discussed w/ the patient...  LABORATORY DATA:  Reviewed in the EPIC EMR & discussed w/ the patient...   Assessment & Plan:    HBP>  Controlled on her 3 med regimen, continue same, encouraged to take every day, no salt, get wt down...Marland KitchenMarland KitchenMarland Kitchen  CHOL>  She hasn't lost any wt despite diet counseling etc; LC tried Cres5MWF but she proved INTOL w/ severe muscle symptoms that resolved off this med; she continues to f/u  w/ LC every 42mo...  GERD>  follwed by DrGessner for GI on Omeprazole for her reflux symptoms & ?Barrett's...  Divertics/ IBS>  She has Bentyl to use for abd cramping etc...   RHEUM>  DJD/ Gout/ FM/ etc>  Managed by [employer] w/ help from DrCaffrey, DrHirsh, etc;  Pt has narcotic contract w/ DrD &  stable on current 6 med regimen noted above...  Psyche>  Followed by Kandis Nab on 3 med regimen & appears stable...   Patient's Medications  New Prescriptions   No medications on file  Previous Medications   ALLOPURINOL (ZYLOPRIM) 300 MG TABLET    Take 2 tabs  by mouth daily.   AMLODIPINE (NORVASC) 10 MG TABLET    TAKE 1 TABLET DAILY   ASPIRIN 81 MG TABLET    Take 81 mg by mouth daily.     BUPROPION (WELLBUTRIN XL) 300 MG 24 HR TABLET    Take 300 mg by mouth daily.     CYCLOBENZAPRINE (FLEXERIL) 10 MG TABLET    Take 1/2 to 1 tab by mouth at bedtime as needed.   DICYCLOMINE (BENTYL) 20 MG TABLET    Take 20 mg by mouth 3 (three) times daily as needed.     DULOXETINE (CYMBALTA) 60 MG CAPSULE    Take 60 mg by mouth 2 (two) times daily.     FISH OIL-OMEGA-3 FATTY ACIDS 1000 MG CAPSULE    Take 2 capsules daily   FLUTICASONE (FLONASE) 50 MCG/ACT NASAL SPRAY    Place 2 sprays into the nose at bedtime.   GABAPENTIN (NEURONTIN) 300 MG CAPSULE    Take 300 mg by mouth 3 (three) times daily as needed.     HYDROCODONE-ACETAMINOPHEN (VICODIN) 5-500 MG PER TABLET    Take 1 tablet by mouth every 6 (six) hours as needed.     LOSARTAN (COZAAR) 100 MG TABLET    Take 1 tablet (100 mg total) by mouth daily.   METHOCARBAMOL (ROBAXIN) 500 MG TABLET    Take 500 mg by mouth 3 (three) times daily as needed. Take as directed by Dr. Corliss Skains.    METOPROLOL SUCCINATE (TOPROL-XL) 100 MG 24 HR TABLET    TAKE 1 TABLET DAILY   MULTIPLE VITAMINS-MINERALS (WOMENS MULTIVITAMIN PLUS PO)    Take 1 tablet by mouth daily.   OMEPRAZOLE (PRILOSEC) 20 MG CAPSULE    TAKE 1 CAPSULE DAILY   QUETIAPINE (SEROQUEL) 300 MG TABLET    Take 600 mg by mouth at bedtime.    Modified Medications   No medications on file  Discontinued Medications   ERGOCALCIFEROL (VITAMIN D2) 50000 UNITS CAPSULE    Take 50,000 Units by mouth once a week.

## 2011-10-05 NOTE — Patient Instructions (Addendum)
Today we updated your med list in our EPIC system...    Continue your current medications the same...  We reviewed your prev lab data...   Continue your follow up w/ DrDeveshwar for rheumatology...  Stay as active as possible...  Call for any questions...  Let's plan a follw up visit in 6 months.Marland KitchenMarland Kitchen

## 2011-10-10 ENCOUNTER — Telehealth: Payer: Self-pay | Admitting: Pulmonary Disease

## 2011-10-17 NOTE — Telephone Encounter (Signed)
Leigh, has this been taken care of? Thanks.

## 2011-10-17 NOTE — Telephone Encounter (Signed)
These papers are on SN cart waiting to be signed.  Will send them back to [facility] once completed. thanks

## 2011-10-17 NOTE — Telephone Encounter (Signed)
SN had signed the paperwork per Leigh and they have been sent back down to [facility].  Pt is aware that she will be contacted by that department when they are completed.  Will sign off.

## 2011-12-19 ENCOUNTER — Other Ambulatory Visit: Payer: Self-pay | Admitting: Obstetrics and Gynecology

## 2011-12-19 DIAGNOSIS — Z1231 Encounter for screening mammogram for malignant neoplasm of breast: Secondary | ICD-10-CM

## 2012-01-27 ENCOUNTER — Ambulatory Visit (HOSPITAL_COMMUNITY): Payer: Medicare PPO

## 2012-02-09 ENCOUNTER — Ambulatory Visit (HOSPITAL_COMMUNITY)
Admission: RE | Admit: 2012-02-09 | Discharge: 2012-02-09 | Disposition: A | Discharge status: Home / Self Care | Payer: Medicare PPO | Source: Ambulatory Visit | Attending: Obstetrics and Gynecology | Admitting: Obstetrics and Gynecology

## 2012-02-09 DIAGNOSIS — Z1231 Encounter for screening mammogram for malignant neoplasm of breast: Secondary | ICD-10-CM | POA: Insufficient documentation

## 2012-02-16 ENCOUNTER — Other Ambulatory Visit: Payer: Self-pay | Admitting: *Deleted

## 2012-02-16 DIAGNOSIS — R928 Other abnormal and inconclusive findings on diagnostic imaging of breast: Secondary | ICD-10-CM

## 2012-02-16 DIAGNOSIS — N63 Unspecified lump in unspecified breast: Secondary | ICD-10-CM

## 2012-02-16 NOTE — Progress Notes (Signed)
Orders resulting from abnormal mammogram

## 2012-03-05 ENCOUNTER — Other Ambulatory Visit: Payer: Self-pay | Admitting: *Deleted

## 2012-03-05 DIAGNOSIS — Z780 Asymptomatic menopausal state: Secondary | ICD-10-CM

## 2012-03-09 ENCOUNTER — Ambulatory Visit
Admission: RE | Admit: 2012-03-09 | Discharge: 2012-03-09 | Disposition: A | Discharge status: Home / Self Care | Payer: Medicare PPO | Source: Ambulatory Visit | Attending: Obstetrics and Gynecology | Admitting: Obstetrics and Gynecology

## 2012-03-09 DIAGNOSIS — N63 Unspecified lump in unspecified breast: Secondary | ICD-10-CM

## 2012-03-09 DIAGNOSIS — R928 Other abnormal and inconclusive findings on diagnostic imaging of breast: Secondary | ICD-10-CM

## 2012-03-15 ENCOUNTER — Encounter: Payer: Self-pay | Admitting: Gynecology

## 2012-03-15 ENCOUNTER — Ambulatory Visit (INDEPENDENT_AMBULATORY_CARE_PROVIDER_SITE_OTHER): Payer: Medicare PPO | Admitting: Gynecology

## 2012-03-15 ENCOUNTER — Other Ambulatory Visit (HOSPITAL_COMMUNITY)
Admission: RE | Admit: 2012-03-15 | Discharge: 2012-03-15 | Disposition: A | Discharge status: Home / Self Care | Payer: Medicare PPO | Source: Ambulatory Visit | Attending: Gynecology | Admitting: Gynecology

## 2012-03-15 VITALS — BP 142/94 | Ht 65.5 in | Wt 181.0 lb

## 2012-03-15 DIAGNOSIS — Z780 Asymptomatic menopausal state: Secondary | ICD-10-CM

## 2012-03-15 DIAGNOSIS — N952 Postmenopausal atrophic vaginitis: Secondary | ICD-10-CM

## 2012-03-15 DIAGNOSIS — Z124 Encounter for screening for malignant neoplasm of cervix: Secondary | ICD-10-CM

## 2012-03-15 DIAGNOSIS — M8580 Other specified disorders of bone density and structure, unspecified site: Secondary | ICD-10-CM

## 2012-03-15 DIAGNOSIS — R635 Abnormal weight gain: Secondary | ICD-10-CM

## 2012-03-15 DIAGNOSIS — M949 Disorder of cartilage, unspecified: Secondary | ICD-10-CM

## 2012-03-15 DIAGNOSIS — Z01419 Encounter for gynecological examination (general) (routine) without abnormal findings: Secondary | ICD-10-CM | POA: Insufficient documentation

## 2012-03-15 DIAGNOSIS — Z1151 Encounter for screening for human papillomavirus (HPV): Secondary | ICD-10-CM | POA: Insufficient documentation

## 2012-03-15 NOTE — Patient Instructions (Signed)
Tetanus, Diphtheria, Pertussis (Tdap) Vaccine What You Need to Know WHY GET VACCINATED? Tetanus, diphtheria and pertussis can be very serious diseases, even for adolescents and adults. Tdap vaccine can protect us from these diseases. TETANUS (Lockjaw) causes painful muscle tightening and stiffness, usually all over the body.  It can lead to tightening of muscles in the head and neck so you can't open your mouth, swallow, or sometimes even breathe. Tetanus kills about 1 out of 5 people who are infected. DIPHTHERIA can cause a thick coating to form in the back of the throat.  It can lead to breathing problems, paralysis, heart failure, and death. PERTUSSIS (Whooping Cough) causes severe coughing spells, which can cause difficulty breathing, vomiting and disturbed sleep.  It can also lead to weight loss, incontinence, and rib fractures. Up to 2 in 100 adolescents and 5 in 100 adults with pertussis are hospitalized or have complications, which could include pneumonia and death. These diseases are caused by bacteria. Diphtheria and pertussis are spread from person to person through coughing or sneezing. Tetanus enters the body through cuts, scratches, or wounds. Before vaccines, the [place] saw as many as 200,000 cases a year of diphtheria and pertussis, and hundreds of cases of tetanus. Since vaccination began, tetanus and diphtheria have dropped by about 99% and pertussis by about 80%. TDAP VACCINE Tdap vaccine can protect adolescents and adults from tetanus, diphtheria, and pertussis. One dose of Tdap is routinely given at age 11 or 12. People who did not get Tdap at that age should get it as soon as possible. Tdap is especially important for health care professionals and anyone having close contact with a baby younger than 12 months. Pregnant women should get a dose of Tdap during every pregnancy, to protect the newborn from pertussis. Infants are most at risk for severe, life-threatening  complications from pertussis. A similar vaccine, called Td, protects from tetanus and diphtheria, but not pertussis. A Td booster should be given every 10 years. Tdap may be given as one of these boosters if you have not already gotten a dose. Tdap may also be given after a severe cut or burn to prevent tetanus infection. Your doctor can give you more information. Tdap may safely be given at the same time as other vaccines. SOME PEOPLE SHOULD NOT GET THIS VACCINE  If you ever had a life-threatening allergic reaction after a dose of any tetanus, diphtheria, or pertussis containing vaccine, OR if you have a severe allergy to any part of this vaccine, you should not get Tdap. Tell your doctor if you have any severe allergies.  If you had a coma, or long or multiple seizures within 7 days after a childhood dose of DTP or DTaP, you should not get Tdap, unless a cause other than the vaccine was found. You can still get Td.  Talk to your doctor if you:  have epilepsy or another nervous system problem,  had severe pain or swelling after any vaccine containing diphtheria, tetanus or pertussis,  ever had Guillain-Barr Syndrome (GBS),  aren't feeling well on the day the shot is scheduled. RISKS OF A VACCINE REACTION With any medicine, including vaccines, there is a chance of side effects. These are usually mild and go away on their own, but serious reactions are also possible. Brief fainting spells can follow a vaccination, leading to injuries from falling. Sitting or lying down for about 15 minutes can help prevent these. Tell your doctor if you feel dizzy or light-headed, or   have vision changes or ringing in the ears. Mild problems following Tdap (Did not interfere with activities)  Pain where the shot was given (about 3 in 4 adolescents or 2 in 3 adults)  Redness or swelling where the shot was given (about 1 person in 5)  Mild fever of at least 100.65F (up to about 1 in 25 adolescents or 1 in  100 adults)  Headache (about 3 or 4 people in 10)  Tiredness (about 1 person in 3 or 4)  Nausea, vomiting, diarrhea, stomach ache (up to 1 in 4 adolescents or 1 in 10 adults)  Chills, body aches, sore joints, rash, swollen glands (uncommon) Moderate problems following Tdap (Interfered with activities, but did not require medical attention)  Pain where the shot was given (about 1 in 5 adolescents or 1 in 100 adults)  Redness or swelling where the shot was given (up to about 1 in 16 adolescents or 1 in 25 adults)  Fever over 102F (about 1 in 100 adolescents or 1 in 250 adults)  Headache (about 3 in 20 adolescents or 1 in 10 adults)  Nausea, vomiting, diarrhea, stomach ache (up to 1 or 3 people in 100)  Swelling of the entire arm where the shot was given (up to about 3 in 100). Severe problems following Tdap (Unable to perform usual activities, required medical attention)  Swelling, severe pain, bleeding and redness in the arm where the shot was given (rare). A severe allergic reaction could occur after any vaccine (estimated less than 1 in a million doses). WHAT IF THERE IS A SERIOUS REACTION? What should I look for?  Look for anything that concerns you, such as signs of a severe allergic reaction, very high fever, or behavior changes. Signs of a severe allergic reaction can include hives, swelling of the face and throat, difficulty breathing, a fast heartbeat, dizziness, and weakness. These would start a few minutes to a few hours after the vaccination. What should I do?  If you think it is a severe allergic reaction or other emergency that can't wait, call 9-1-1 or get the person to the nearest hospital. Otherwise, call your doctor.  Afterward, the reaction should be reported to the "Vaccine Adverse Event Reporting System" (VAERS). Your doctor might file this report, or you can do it yourself through the VAERS web site at www.vaers.LAgents.no, or by calling 1-515-418-3225. VAERS is  only for reporting reactions. They do not give medical advice.  THE NATIONAL VACCINE INJURY COMPENSATION PROGRAM The National Vaccine Injury Compensation Program (VICP) is a federal program that was created to compensate people who may have been injured by certain vaccines. Persons who believe they may have been injured by a vaccine can learn about the program and about filing a claim by calling 1-818 206 2529 or visiting the VICP website at SpiritualWord.at. HOW CAN I LEARN MORE?  Ask your doctor.  Call your local or state health department.  Contact the Centers for Disease Control and Prevention (CDC):  Call 908-640-6298 or visit CDC's website at PicCapture.uy CDC Tdap Vaccine VIS (07/07/11) Document Released: 08/16/2011 Document Reviewed: 08/16/2011 [facility] Patient Information 2013 [place], [place].  Herpes Zoster Virus Vaccine What is this medicine? HERPES ZOSTER VIRUS VACCINE (HUR peez ZOS ter vahy ruhs vak SEEN) is a vaccine. It is used to prevent shingles in adults 64 years old and over. This vaccine is not used to treat shingles or nerve pain from shingles. This medicine may be used for other purposes; ask your health care provider or  pharmacist if you have questions. What should I tell my health care provider before I take this medicine? They need to know if you have any of these conditions: -cancer like leukemia or lymphoma -immune system problems or therapy -infection with fever -tuberculosis -an unusual or allergic reaction to vaccines, neomycin, gelatin, other medicines, foods, dyes, or preservatives -pregnant or trying to get pregnant -breast-feeding How should I use this medicine? This vaccine is for injection under the skin. It is given by a health care professional. Talk to your pediatrician regarding the use of this medicine in children. This medicine is not approved for use in children. Overdosage: If you think you have taken too much of  this medicine contact a poison control center or emergency room at once. NOTE: This medicine is only for you. Do not share this medicine with others. What if I miss a dose? This does not apply. What may interact with this medicine? Do not take this medicine with any of the following medications: -adalimumab -anakinra -etanercept -infliximab -medicines to treat cancer -medicines that suppress your immune system This medicine may also interact with the following medications: -immunoglobulins -steroid medicines like prednisone or cortisone This list may not describe all possible interactions. Give your health care provider a list of all the medicines, herbs, non-prescription drugs, or dietary supplements you use. Also tell them if you smoke, drink alcohol, or use illegal drugs. Some items may interact with your medicine. What should I watch for while using this medicine? Visit your doctor for regular check ups. This vaccine, like all vaccines, may not fully protect everyone. After receiving this vaccine it may be possible to pass chickenpox infection to others. Avoid people with immune system problems, pregnant women who have not had chickenpox, and newborns of women who have not had chickenpox. Talk to your doctor for more information. What side effects may I notice from receiving this medicine? Side effects that you should report to your doctor or health care professional as soon as possible: -allergic reactions like skin rash, itching or hives, swelling of the face, lips, or tongue -breathing problems -feeling faint or lightheaded, falls -fever, flu-like symptoms -pain, tingling, numbness in the hands or feet -swelling of the ankles, feet, hands -unusually weak or tired Side effects that usually do not require medical attention (report to your doctor or health care professional if they continue or are bothersome): -aches or pains -chickenpox-like rash -diarrhea -headache -loss of  appetite -nausea, vomiting -redness, pain, swelling at site where injected -runny nose This list may not describe all possible side effects. Call your doctor for medical advice about side effects. You may report side effects to FDA at 1-800-FDA-1088. Where should I keep my medicine? This drug is given in a hospital or clinic and will not be stored at home. NOTE: This sheet is a summary. It may not cover all possible information. If you have questions about this medicine, talk to your doctor, pharmacist, or health care provider.  2012, [employer]vier/Gold Standard. (08/03/2009 5:43:50 PM)

## 2012-03-15 NOTE — Progress Notes (Signed)
Tammy Mullins Jul 20, 1950 [id number]   History:    62 y.o.  for overdue GYN exam. Patient has had issues on and off with vaginal atrophy. She has not been seen in the office since 2010. Review of her record demonstrated the following:  1980 she had a left salpingo-oophorectomy secondary to ectopic pregnancy 1987 diagnostic laparoscopy negative 2009 normal colonoscopy 2010 bone density study T score of -1.4 left femoral neck 2014 mammogram normal  Patient's primary physician is Dr. Alroy Dust who is following the patient for hypercholesterolemia, hypertension, IBS as well as osteoarthritis and mitral valve prolapse (see patient's problem list for additional information as well as medication list)  Past medical history,surgical history, family history and social history were all reviewed and documented in the EPIC chart.  Gynecologic History No LMP recorded. Patient is postmenopausal. Contraception: post menopausal status Last Pap: 2010. Results were: normal Last mammogram: 2014. Results were: normal  Obstetric History OB History    Grav Para Term Preterm Abortions TAB SAB Ect Mult Living   1 0        0     # Outc Date GA Lbr Len/2nd Wgt Sex Del Anes PTL Lv   1 GRA                ROS: A ROS was performed and pertinent positives and negatives are included in the history.  GENERAL: No fevers or chills. HEENT: No change in vision, no earache, sore throat or sinus congestion. NECK: No pain or stiffness. CARDIOVASCULAR: No chest pain or pressure. No palpitations. PULMONARY: No shortness of breath, cough or wheeze. GASTROINTESTINAL: No abdominal pain, nausea, vomiting or diarrhea, melena or bright red blood per rectum. GENITOURINARY: No urinary frequency, urgency, hesitancy or dysuria. MUSCULOSKELETAL: No joint or muscle pain, no back pain, no recent trauma. DERMATOLOGIC: No rash, no itching, no lesions. ENDOCRINE: No polyuria, polydipsia, no heat or cold intolerance. No recent change in  weight. HEMATOLOGICAL: No anemia or easy bruising or bleeding. NEUROLOGIC: No headache, seizures, numbness, tingling or weakness. PSYCHIATRIC: No depression, no loss of interest in normal activity or change in sleep pattern.     Exam: chaperone present  BP 142/94  Ht 5' 5.5" (1.664 m)  Wt 181 lb (82.101 kg)  BMI 29.66 kg/m2  Body mass index is 29.66 kg/(m^2).  General appearance : Well developed well nourished female. No acute distress HEENT: Neck supple, trachea midline, no carotid bruits, no thyroidmegaly Lungs: Clear to auscultation, no rhonchi or wheezes, or rib retractions  Heart: Regular rate and rhythm, no murmurs or gallops Breast:Examined in sitting and supine position were symmetrical in appearance, no palpable masses or tenderness,  no skin retraction, no nipple inversion, no nipple discharge, no skin discoloration, no axillary or supraclavicular lymphadenopathy Abdomen: no palpable masses or tenderness, no rebound or guarding Extremities: no edema or skin discoloration or tenderness  Pelvic:  Bartholin, Urethra, Skene Glands: Within normal limits             Vagina: No gross lesions or discharge, vaginal atrophy  Cervix: No gross lesions or discharge  Uterus  axial, normal size, shape and consistency, non-tender and mobile  Adnexa  Without masses or tenderness  Anus and perineum  normal   Rectovaginal  normal sphincter tone without palpated masses or tenderness             Hemoccult cards provided     Assessment/Plan:  62 y.o. female postmenopausal with vaginal atrophy. Patient was offered estrogen vaginal cream but  declined at the present time since she states she is not sexually active. She did not take her blood pressure medication today and will be taken as soon as she gets home her blood pressure was 142/94. She has a scheduled bone density study for next week. Hemoccult cards were provided for her to symmetrical the office for testing. No lab work done today her  primary physician will be doing it in February. Pap smear was done today and new guidelines were discussed. Her menopausal symptoms have been offset ted by the Cymbalta that she's currently taking. She was reminded to do her monthly self breast examination. We discussed importance of calcium vitamin D for osteoporosis prevention as well as regular exercise. I provided her with literature information on the shingles vaccine as well as on the Tdap vaccine for which she will discuss with Dr. Kriste Basque next month.     Ok Edwards MD, 2:53 PM 03/15/2012

## 2012-03-21 ENCOUNTER — Inpatient Hospital Stay (HOSPITAL_COMMUNITY): Admission: RE | Admit: 2012-03-21 | Payer: Medicare PPO | Source: Ambulatory Visit

## 2012-03-23 ENCOUNTER — Ambulatory Visit (HOSPITAL_COMMUNITY)
Admission: RE | Admit: 2012-03-23 | Discharge: 2012-03-23 | Disposition: A | Discharge status: Home / Self Care | Payer: Medicare PPO | Source: Ambulatory Visit | Attending: Gynecology | Admitting: Gynecology

## 2012-03-23 DIAGNOSIS — Z13820 Encounter for screening for osteoporosis: Secondary | ICD-10-CM | POA: Insufficient documentation

## 2012-03-23 DIAGNOSIS — Z780 Asymptomatic menopausal state: Secondary | ICD-10-CM

## 2012-04-11 ENCOUNTER — Ambulatory Visit: Payer: Medicare PPO | Admitting: Pulmonary Disease

## 2012-04-19 ENCOUNTER — Other Ambulatory Visit: Payer: Self-pay | Admitting: Pulmonary Disease

## 2012-05-01 ENCOUNTER — Ambulatory Visit: Payer: Medicare PPO | Admitting: Pulmonary Disease

## 2012-05-08 ENCOUNTER — Other Ambulatory Visit: Payer: Self-pay | Admitting: Pulmonary Disease

## 2012-05-08 MED ORDER — LOSARTAN POTASSIUM 100 MG PO TABS: 100.0000 mg | ORAL_TABLET | Freq: Every day | ORAL | Status: DC

## 2012-05-08 NOTE — Telephone Encounter (Signed)
Refill has been sent into the pharmacy 

## 2012-06-06 ENCOUNTER — Other Ambulatory Visit (INDEPENDENT_AMBULATORY_CARE_PROVIDER_SITE_OTHER): Payer: Medicare PPO

## 2012-06-06 ENCOUNTER — Encounter: Payer: Self-pay | Admitting: Pulmonary Disease

## 2012-06-06 ENCOUNTER — Ambulatory Visit (INDEPENDENT_AMBULATORY_CARE_PROVIDER_SITE_OTHER): Payer: Medicare PPO | Admitting: Pulmonary Disease

## 2012-06-06 VITALS — BP 124/90 | HR 83 | Temp 98.0°F | Ht 65.0 in | Wt 183.0 lb

## 2012-06-06 DIAGNOSIS — K5730 Diverticulosis of large intestine without perforation or abscess without bleeding: Secondary | ICD-10-CM

## 2012-06-06 DIAGNOSIS — M8580 Other specified disorders of bone density and structure, unspecified site: Secondary | ICD-10-CM

## 2012-06-06 DIAGNOSIS — I10 Essential (primary) hypertension: Secondary | ICD-10-CM

## 2012-06-06 DIAGNOSIS — E559 Vitamin D deficiency, unspecified: Secondary | ICD-10-CM

## 2012-06-06 DIAGNOSIS — K219 Gastro-esophageal reflux disease without esophagitis: Secondary | ICD-10-CM

## 2012-06-06 DIAGNOSIS — M949 Disorder of cartilage, unspecified: Secondary | ICD-10-CM

## 2012-06-06 DIAGNOSIS — IMO0001 Reserved for inherently not codable concepts without codable children: Secondary | ICD-10-CM

## 2012-06-06 DIAGNOSIS — E7800 Pure hypercholesterolemia, unspecified: Secondary | ICD-10-CM

## 2012-06-06 DIAGNOSIS — F411 Generalized anxiety disorder: Secondary | ICD-10-CM

## 2012-06-06 DIAGNOSIS — F489 Nonpsychotic mental disorder, unspecified: Secondary | ICD-10-CM

## 2012-06-06 DIAGNOSIS — K589 Irritable bowel syndrome without diarrhea: Secondary | ICD-10-CM

## 2012-06-06 DIAGNOSIS — M899 Disorder of bone, unspecified: Secondary | ICD-10-CM

## 2012-06-06 DIAGNOSIS — G894 Chronic pain syndrome: Secondary | ICD-10-CM

## 2012-06-06 DIAGNOSIS — M1990 Unspecified osteoarthritis, unspecified site: Secondary | ICD-10-CM

## 2012-06-06 DIAGNOSIS — R079 Chest pain, unspecified: Secondary | ICD-10-CM | POA: Insufficient documentation

## 2012-06-06 LAB — BASIC METABOLIC PANEL
BUN: 14 mg/dL (ref 6–23)
CO2: 29 mEq/L (ref 19–32)
Chloride: 102 mEq/L (ref 96–112)
Creatinine, Ser: 0.9 mg/dL (ref 0.4–1.2)

## 2012-06-06 LAB — TSH: TSH: 1.49 u[IU]/mL (ref 0.35–5.50)

## 2012-06-06 LAB — CBC WITH DIFFERENTIAL/PLATELET
Basophils Relative: 0.3 % (ref 0.0–3.0)
Eosinophils Absolute: 0.1 10*3/uL (ref 0.0–0.7)
Eosinophils Relative: 2.2 % (ref 0.0–5.0)
Lymphocytes Relative: 29 % (ref 12.0–46.0)
MCHC: 33 g/dL (ref 30.0–36.0)
Neutrophils Relative %: 62.9 % (ref 43.0–77.0)
Platelets: 275 10*3/uL (ref 150.0–400.0)
RBC: 4.17 Mil/uL (ref 3.87–5.11)
WBC: 5.8 10*3/uL (ref 4.5–10.5)

## 2012-06-06 LAB — HEPATIC FUNCTION PANEL
ALT: 20 U/L (ref 0–35)
AST: 15 U/L (ref 0–37)
Total Protein: 7.7 g/dL (ref 6.0–8.3)

## 2012-06-06 LAB — LIPID PANEL: Cholesterol: 223 mg/dL — ABNORMAL HIGH (ref 0–200)

## 2012-06-06 LAB — LDL CHOLESTEROL, DIRECT: Direct LDL: 122.8 mg/dL

## 2012-06-06 MED ORDER — CYCLOBENZAPRINE HCL 10 MG PO TABS: 10.0000 mg | ORAL_TABLET | Freq: Every day | ORAL | Status: DC

## 2012-06-06 NOTE — Progress Notes (Signed)
Subjective:    Patient ID: Tammy Mullins, female    DOB: 02/22/1951, 62 y.o.   MRN: [id number]  HPI 62 y/o BF here for a follow up visit... she has multiple medical problems as noted below...   ~  we have prev filled out disability papers at her request-  she is disabled due to multiple medical problems and DJD/ Fibromyalgia/ Chronic Pain syndrome/ and Psychiatric issues... these problems have been evaluated and are followed by mult other practitioners including: DrDeveshwar (Rheum), DrNitka (Ortho), DrHirsh (Neurosurg), and [facility] (LisaPoulas)...  ~  November 04, 2010:  39mo ROV & add-on for right facial swelling noted today> states swollen area right mandible & below jawline, min tender, no redness, denies dental prob, no pain chewing, but has some dry mouth; exam showed right submandib gland swelling, min tender, cannot see duct abn or obvious stone; discussed rx w/ warm compresses, sialogogues, fluids, & we will cover w/ Augmentin for completeness...   >Her BP is controlled on Metoprolol, Amlodipine, Losartan; BP= 146/88 & usually better at home (she didn't take meds today); denies CP, palpit, syncope, edema, etc...   >Chol is treated w/ diet & FishOil; she was seen by [facility] & tried on Cres5MWF but she developed incapacitating muscle pain "every muscle in my body" so she stopped the Crestor & the symptoms resolved; she has f/u w/ LC soon...   >She has GERD & Barrett's esoph on Omep & gas meds;  Divertics & IBS on Bentyl from DrGessner; stable & doing reasonably well...   >She is followed by Rheum, Ortho, Neurosurg for her DJD, Gout, FM, chr pain syndrome...   >She is still on Vit D 50K weekly.   >She continues under the care of [facility], drPoulas on Wellbutrin, Cymbalta, Seroquel.  ~  May 23, 2011:  39mo ROV & Tammy Mullins is c/o her FM & HAs noting "my FM is getting worse"; as noted she is followed by Ortho, Rheum, & Neurosurg for her DJD, gout, FM, & chronic  pain> on Allopurinol, Vicodin, Robaxin/ Flexeril, Neurontin, Cymbalta; plus Psyche- DrPoulas- on Wellbutrin & Seroquel;  She is encouraged to start a program of gentle stretching exercises etc...    BP controlled on Metop, Amlod, Losar; she is followed in the [facility]... See prob list below>>  ~  October 05, 2011:  79mo ROV & Tammy Mullins is c/o discomfort in her back, shoulders, wrist, etc "I feel so bad" she says & doesn't want to get out of bed; we reviewed her meds- Neurontin, Vicodin, Flexeril/ Robaxin, Allopurinol, MVI; and our last note from DrDeveshwar 10/12 (FM, Gout, DJD, insomnia & fatigue)- they added Cymbalta; encouraged pt to maintain close f/u w/ her Rheumatologist for her condition...    >HBP treated w/ Metop, Amlodip, Losartan> BP= 146/92 & encouraged to take meds every day & work on wt reduction...    >Hx MVP but she denies CP (other rhan FM discomfort), palpit, SOB, edema, etc...    >Trying to control Chol via the [facility] w/ FishOil + diet/exerc; she tried Crestor but states it made her nauseated & she refuses to take it even in low dose; last FLP 3/13 not at goals- she declines med rx.Marland Kitchen    >GI> on Omep & Bentyl; followed by [employer]; states she's doing OK w/o abd pain, n/v, c/d, blood in stool...    >She is still followed by Lindaann Slough for Psychiatry> she remains on Wellbutrin, Seroquel, Cymbalta... We reviewed prob list, meds, xrays and labs>  see below fort updates >>  ~  June 06, 2012:  89mo ROV & Tammy Mullins has been under a lot of stress w/ the passing of her favorite niece age 45, and the breakup of a long term relationship; she has developed some AtypCP- heavy feeling, sore&tender, worse w/ certain movements, and sl better w/ rest/ heat/ & her pain meds; we discussed checking CXR, EKG, & fasting blood work... We reviewed the following medical problems during today's office visit >>      HBP> on MetopER100, Amlod10, Losar100; BP= 124/90 & she says usually better at home & feels  the stress has raised it some today; reminded to elim sodium & get wt down!     Hx MVP> she gets occas sharp fleeting CP & palpit; asked to continue the current meds including the MetopER...     Chol> on diet + FishOil; prev treated via [facility]- tried Crestor but states it made her nauseated & she refuses to take it even in low dose; FLP 4/14 showed TChol 223, TG 237, HDL 37, LDL 123    GI> GERD, Barrett's, Divertics, IBS> on Omep20 & Bentyl20Tid prn; followed by [employer]; states she's doing OK w/o abd pain, dysphagia, n/v, c/d, blood in stool...    DJD, Gout, FM, Chr pain syndrome> on Vicodin prn, Allopurinol300Bid, Neurontin300Tid, Flex10Qhs, Robaxin500Tid prn; followed by DrDeveshwar & pt reports seen 3/14- no changes made, DrD wants her in therapy...     Osteopenia, Vit D defic> BMD 1/14 showed TScore -1.6 in right [facility]; labs 4/14 showed Vit D = 28 & pt rec to re-rt Vit D OTC 2000u daily + wt bearing exercise...    Psychiatry> she remains on WellbutrinXL300, Seroquel300-2HS, Cymbalta60Bid; followed by DrPoulis who writes all these meds, we do not have notes from them ...  We reviewed prob list, meds, xrays and labs> see below for updates >> Time spent: 45 min... CXR 4/14 shows normal heart size, clear lungs, DJD spine, NAD.Marland KitchenMarland Kitchen  EKG 4/14 showed NSR, rate80, wnl, NAD...  LABS 4/14:  FLP- not at goals on diet;  Chems- wnl;  CBC- wnl;  TSH=1.49;  VitD=28 & rec to take VitD 2000u daily...    Problem List:  ALLERGIC RHINITIS (ICD-477.9) - she uses OTC antihistamines, mucinex, saline and FLONASE for her sinuses...  Hx SIALADENITIS>  Presented w/ right submandib gland swollen & min tender 9/12; ducts appear intact & "lemon test" was neg w/o incr pain/ swelling; treated w/ warm compresses, Augmentin 875mg  Bid, Sialogogues w/ lemon drops/ gum/ etc...  HYPERTENSION (ICD-401.9) - on ASA 81mg /d, TOPROL XL 100/d,  AMLODIPINE 10mg /d,  LOSARTAN 100mg /d...  ~  9/12: BP= 146/88 and feeling OK,  tolerating meds well... denies HA, visual changes, CP, palipit, syncope, dyspnea, edema, etc... ~  3/13: BP= 138/98 & she is reminded to take meds regularly every day, no salt, & work on weight reduction... ~  8/13:  BP= 146/92 & supposed to be on her regularly... ~  4/14:  on MetopER100, Amlod10, Losar100; BP= 124/90 & she says usually better at home & feels the stress has raised it some today; reminded to elim sodium & get wt down!   MITRAL VALVE PROLAPSE, HX OF (ICD-V12.50) - notes some intermittent sharp CP, but denies palpit/ ch in SOB/ edema/ etc...  HYPERCHOLESTEROLEMIA (ICD-272.0) - on diet + OMEGA-3 Fish Oil; she was tried on Crestor5mg MWF but INTOL w/ severe muscle pain & had to stop (pain resolved off med)... ~  FLP 10/06 by her  GYN showed TChol 254, TG 364, HDL 38, LDL 143 ~  FLP 2/10 showed TChol 221, TG 194, HDL 37, LDL 130... rec> better diet, she refuses statin meds. ~  FLP 5/11 showed TChol 220, TG 192, HDL 40, LDL 133 ~  FLP 6/12 showed TChol 261, TG 237, HDL 48, LDL 162... rec refer to [facility] for management ~  [facility] has been working w/ her & checking FLP Q50mo- see flow chart... ~  FLP 3/13 on diet+FishOil showed TChol 221, TG 171, HDL 41, LDL 128... "it's the crackers & the cheese" she says... ~  FLP 4/14 on diet+FishOil showed TChol 223, TG 237, HDL 37, LDL 123  GERD (ICD-530.81) & ? of BARRETTS ESOPHAGUS (ICD-530.85) - long hx of GERD symptoms and followed by DrGessner for GI... she takes OMEPRAZOLE 20mg /d + GasX etc...  ~  last EGD 9/08 showed sm HH and ?1cm segment of Barrett's but bx was neg... therefore routine f/u suggested...   DIVERTICULOSIS, COLON (ICD-562.10) & IRRITABLE BOWEL SYNDROME (ICD-564.1) - she takes DICYCLOMINE 20mg Tid from [employer]...  ~  colonoscopy 11/02 showed mild divertics, otherw neg...  ~  10/09:  f/u GI eval by DrGessner due to symptoms of pain, diarrhea, bloating> colonoscopy was normal & bx= neg, therefore felt to all be c/w  IBS...  GYN >> followed by DrFernandez & seen 1/14> his note is reviewed...  OSTEOARTHRITIS (ICD-715.90) GOUT (ICD-274.9) FIBROMYALGIA (ICD-729.1) DE QUERVAIN'S TENOSYNOVITIS (ICD-727.04) CHRONIC PAIN SYNDROME (ICD-338.4) - as noted above>  these problems are followed by various specialists including:  DrDeveshwar (Rheum), DrNitka/Caffrey (Ortho), DrHirsh (Neurosurg)...  she is currently taking VICODIN Prn,  ROBAXIN 500mg Tid (uses Prn),  FLEXERIL 10mg - 1-2 Qhs, ALLOPURINOL 300mg - 1.5 tabs daily,  COLCRYS 0.6mg Bid Prn, & NEURONTIN 300mg Tid (she takes Prn)... ~  She reports prev neck surg w/ plate from DrHirsh, and left rotator cuff surg by DrCaffrey... ~  11/10:  she signed a narcotic contract w/ [employer]... ~  2/11:  note from DrDeveshwar reviewed & pain med Rx noted... ~  10/12:  Note from DrDeveshwar reviewed> FM, Gout, DJD... ~  8/13:  Note from DrDeveshwar is reviewed> active FM w/ generalized pain & trigger points, DJD & LBP, treated w/ Robaxin & ydrocodone... ~  4/14:  on Vicodin prn, Allopurinol300Bid, Neurontin300Tid, Flex10Qhs, Robaxin500Tid prn; followed by DrDeveshwar & pt reports seen 3/14- no changes made, DrD wants her in therapy...   VIT D DEFICIENCY>  She is stopped her prev VitD 50K weekly on her own... ~  Vit D level 2/10 here = 17... rec to start Vit D pplementation... ~  BMD 11/10 by DrGottsegen showed TScores -0.3 in Spine, and -1.4 in left FemNeck ~  8/13:  She had stopped her VitD 50K weekly supplement, she's not sure why?, needs f/u VitD level & decision re restart... ~  BMD 1/14 showed TScores -0.8 in Spine and -1.6 in right FemNeck... ~  Labs 4/14 showed Vit D = 28 and she is asked, once again, to take Vit D supplement ~2000u daily...  ATTENTION DEFICIT DISORDER (ICD-314.00) PSYCHIATRIC DISORDER (ICD-300.9) ANXIETY (ICD-300.00) - as noted above>  these problems are followed and treated by [facility] we do not have notes from these  doctors but the pt states that she is prev being treated w/ Adderall, WellbutrinXR, Cymbalta, Seroquel, and Rozerem... ~  2/10:  they make freq adjustments & she indicates that she is off the Adderall & Rozerum now... ~  11/10: she indicates stable on the  Wellbutrin, Cymbalta, Seroquel... ~  5/11:  pt indicates that she is stable on WELLBUTRIN XL 300mg /d,  CYMBALTA 60mg Bid, SEROQUEL 300mg - 2Qhs... ~  4/14:  she remains on WellbutrinXL300, Seroquel300-2HS, Cymbalta60Bid; followed by DrPoulis who writes all these meds, we do not have notes from them.  PRURITUS (ICD-698.9) - she saw DrWoods w/ Hydroxyzine perscribed...   Past Surgical History  Procedure Laterality Date  . Rt  ankle    . Carpal tunnel release    . Rotator cuff repair    . C-spine    . Salpingoophorectomy  Left    ectopic 1980    Outpatient Encounter Prescriptions as of 06/06/2012  Medication Sig Dispense Refill  . allopurinol (ZYLOPRIM) 300 MG tablet Take 2 tabs  by mouth daily.      Marland Kitchen amLODipine (NORVASC) 10 MG tablet TAKE 1 TABLET DAILY  90 tablet  3  . aspirin 81 MG tablet Take 81 mg by mouth daily.        Marland Kitchen buPROPion (WELLBUTRIN XL) 300 MG 24 hr tablet Take 300 mg by mouth daily.        . cyclobenzaprine (FLEXERIL) 10 MG tablet TAKE 1 TABLET BY MOUTH EVERY NIGHT AT BEDTIME  30 tablet  0  . dicyclomine (BENTYL) 20 MG tablet Take 20 mg by mouth 3 (three) times daily as needed.        . DULoxetine (CYMBALTA) 60 MG capsule Take 60 mg by mouth 2 (two) times daily.        . fish oil-omega-3 fatty acids 1000 MG capsule Take 2 capsules daily      . fluticasone (FLONASE) 50 MCG/ACT nasal spray Place 2 sprays into the nose at bedtime.  16 g  11  . gabapentin (NEURONTIN) 300 MG capsule Take 300 mg by mouth 3 (three) times daily as needed.        Marland Kitchen HYDROcodone-acetaminophen (VICODIN) 5-500 MG per tablet Take 1 tablet by mouth every 6 (six) hours as needed.        Marland Kitchen losartan (COZAAR) 100 MG tablet Take 1 tablet (100 mg total) by  mouth daily.  30 tablet  5  . methocarbamol (ROBAXIN) 500 MG tablet Take 500 mg by mouth 3 (three) times daily as needed. Take as directed by Dr. Corliss Skains.       . metoprolol succinate (TOPROL-XL) 100 MG 24 hr tablet TAKE 1 TABLET DAILY  90 tablet  3  . Multiple Vitamins-Minerals (WOMENS MULTIVITAMIN PLUS PO) Take 1 tablet by mouth daily.      Marland Kitchen omeprazole (PRILOSEC) 20 MG capsule TAKE 1 CAPSULE DAILY  90 capsule  3  . QUEtiapine (SEROQUEL) 300 MG tablet Take 600 mg by mouth at bedtime.         No facility-administered encounter medications on file as of 06/06/2012.    Allergies  Allergen Reactions  . Crestor (Rosuvastatin Calcium)     Causes severe body aches    Current Medications, Allergies, Past Medical History, Past Surgical History, Family History, and Social History were reviewed in [employer] record.    Review of Systems        See HPI - all other systems neg except as noted... The patient complains of right facial swelling & dyspnea on exertion.  The patient denies anorexia, fever, weight loss, weight gain, vision loss, decreased hearing, hoarseness, chest pain, syncope, peripheral edema, prolonged cough, headaches, hemoptysis, abdominal pain, melena, hematochezia, severe indigestion/heartburn, hematuria, incontinence, muscle weakness, suspicious skin lesions, transient blindness, difficulty walking,  depression, unusual weight change, abnormal bleeding, enlarged lymph nodes, and angioedema.   Objective:   Physical Exam     WD, WN, 61 y/o BF in NAD... GENERAL:  Alert & oriented; pleasant & cooperative...  HEENT:  [place]/AT, EOM-wnl, PERRLA, EACs-clear, TMs-wnl, NOSE-clear, THROAT-clear, sl dry MMs... NECK:  Supple w/ fairROM; no JVD; normal carotid impulses w/o bruits; no thyromegaly or nodules palpated; no lymphadenopathy. +swollen right submandib glans w/o obvious sign of stone in duct etc; parotids ok... CHEST:  Clear to P & A; without wheezes/ rales/ or  rhonchi heard... HEART:  Regular Rhythm; without murmurs/ rubs/ or gallops detected...  ABDOMEN:  Soft & nontender; normal bowel sounds; no organomegaly or masses palpated... EXT: without deformities, mult bilat trigger points, mild arthritic changes; no varicose veins/ +venous insuffic/ tr edema. NEURO:  CN's intact; motor testing normal; sensory testing normal; gait normal & balance OK. DERM:  No lesions noted; no rash etc...  RADIOLOGY DATA:  Reviewed in the EPIC EMR & discussed w/ the patient...  LABORATORY DATA:  Reviewed in the EPIC EMR & discussed w/ the patient...   Assessment & Plan:    HBP>  Controlled on her 3 med regimen, continue same, encouraged to take every day, no salt, get wt down...Marland KitchenMarland KitchenMarland Kitchen  CHOL>  She hasn't lost any wt despite diet counseling etc; LC tried Cres5MWF but she proved INTOL w/ severe muscle symptoms that resolved off this med; she continues to f/u w/ LC every 92mo...  GERD>  Followed by Rodena Medin for GI on Omeprazole for her reflux symptoms & ?Barrett's...  Divertics/ IBS>  She has Bentyl to use for abd cramping etc...   RHEUM>  DJD/ Gout/ FM/ etc>  Managed by [employer] w/ help from DrCaffrey, DrHirsh, etc;  Pt has narcotic contract w/ DrD & stable on current 6 med regimen noted above...  Psyche>  Followed by Kandis Nab on 3 med regimen & appears stable...   Patient's Medications  New Prescriptions   No medications on file  Previous Medications   ALLOPURINOL (ZYLOPRIM) 300 MG TABLET    Take 2 tabs  by mouth daily.   AMLODIPINE (NORVASC) 10 MG TABLET    TAKE 1 TABLET DAILY   ASPIRIN 81 MG TABLET    Take 81 mg by mouth daily.     BUPROPION (WELLBUTRIN XL) 300 MG 24 HR TABLET    Take 300 mg by mouth daily.     DICYCLOMINE (BENTYL) 20 MG TABLET    Take 20 mg by mouth 3 (three) times daily as needed.     DULOXETINE (CYMBALTA) 60 MG CAPSULE    Take 60 mg by mouth 2 (two) times daily.     FISH OIL-OMEGA-3 FATTY ACIDS 1000 MG CAPSULE    Take 2 capsules daily    FLUTICASONE (FLONASE) 50 MCG/ACT NASAL SPRAY    Place 2 sprays into the nose at bedtime.   GABAPENTIN (NEURONTIN) 300 MG CAPSULE    Take 300 mg by mouth 3 (three) times daily as needed.     HYDROCODONE-ACETAMINOPHEN (VICODIN) 5-500 MG PER TABLET    Take 1 tablet by mouth every 6 (six) hours as needed.     LOSARTAN (COZAAR) 100 MG TABLET    Take 1 tablet (100 mg total) by mouth daily.   METHOCARBAMOL (ROBAXIN) 500 MG TABLET    Take 500 mg by mouth 3 (three) times daily as needed. Take as directed by Dr. Corliss Skains.    METOPROLOL SUCCINATE (TOPROL-XL) 100 MG 24 HR TABLET  TAKE 1 TABLET DAILY   MULTIPLE VITAMINS-MINERALS (WOMENS MULTIVITAMIN PLUS PO)    Take 1 tablet by mouth daily.   OMEPRAZOLE (PRILOSEC) 20 MG CAPSULE    TAKE 1 CAPSULE DAILY   QUETIAPINE (SEROQUEL) 300 MG TABLET    Take 600 mg by mouth at bedtime.    Modified Medications   Modified Medication Previous Medication   CYCLOBENZAPRINE (FLEXERIL) 10 MG TABLET cyclobenzaprine (FLEXERIL) 10 MG tablet      Take 1 tablet (10 mg total) by mouth at bedtime.    TAKE 1 TABLET BY MOUTH EVERY NIGHT AT BEDTIME  Discontinued Medications   No medications on file

## 2012-06-06 NOTE — Patient Instructions (Addendum)
Today we updated your med list in our EPIC system...    Continue your current medications the same...  Today we checked a baseline CXR, EKG, and FASTING blood work...    We will contact you w/ the results when available...   For the Chest discomfort>>    Rest the chest (no heavy lifting etc)...    Apply HEAT- heating pad, warm tub bath, etc...    Use your Hydrocodone every 6H as needed...  Continue your regular follow up visits w/ DrDeveshwar & DrPoulis...  Call for any questions...  Let's plan a follow up visit in 49mo, sooner if needed for problems.Marland KitchenMarland Kitchen

## 2012-06-07 LAB — VITAMIN D 25 HYDROXY (VIT D DEFICIENCY, FRACTURES): Vit D, 25-Hydroxy: 28 ng/mL — ABNORMAL LOW (ref 30–89)

## 2012-06-08 ENCOUNTER — Ambulatory Visit (INDEPENDENT_AMBULATORY_CARE_PROVIDER_SITE_OTHER)
Admission: RE | Admit: 2012-06-08 | Discharge: 2012-06-08 | Disposition: A | Discharge status: Home / Self Care | Payer: Medicare PPO | Source: Ambulatory Visit | Attending: Pulmonary Disease | Admitting: Pulmonary Disease

## 2012-06-08 DIAGNOSIS — R079 Chest pain, unspecified: Secondary | ICD-10-CM

## 2012-06-08 DIAGNOSIS — I10 Essential (primary) hypertension: Secondary | ICD-10-CM

## 2012-06-11 ENCOUNTER — Other Ambulatory Visit: Payer: Self-pay | Admitting: Pulmonary Disease

## 2012-11-28 ENCOUNTER — Telehealth: Payer: Self-pay | Admitting: Pulmonary Disease

## 2012-11-28 MED ORDER — FLUTICASONE PROPIONATE 50 MCG/ACT NA SUSP: 2.0000 | Freq: Every day | NASAL | Status: DC

## 2012-11-28 MED ORDER — DICYCLOMINE HCL 20 MG PO TABS: 20.0000 mg | ORAL_TABLET | Freq: Three times a day (TID) | ORAL | Status: DC | PRN

## 2012-11-28 NOTE — Telephone Encounter (Signed)
Ok to send in refill of the bentyl.  thanks

## 2012-11-28 NOTE — Telephone Encounter (Signed)
rx sent. [id number] to advise the pt. Makaria Poarch, CMA  

## 2012-11-28 NOTE — Telephone Encounter (Signed)
Pt is asking for a refill on bentyl. I see this on pt med list but according to last OV note: >GI> on Omep & Bentyl; followed by DrGessner; states she's doing OK w/o abd pain, n/v, c/d, blood in stool...  Please advise if ok to send in rx for bentyl or does the pt need to get through Dr. Leone Payor?  Flonas refill already sent. Carron Curie, CMA

## 2012-11-29 NOTE — Telephone Encounter (Signed)
Pt is aware. Nothing further needed 

## 2012-12-05 ENCOUNTER — Ambulatory Visit (INDEPENDENT_AMBULATORY_CARE_PROVIDER_SITE_OTHER): Payer: Medicare PPO | Admitting: Pulmonary Disease

## 2012-12-05 ENCOUNTER — Encounter: Payer: Self-pay | Admitting: Pulmonary Disease

## 2012-12-05 VITALS — BP 146/92 | HR 105 | Temp 98.2°F | Ht 65.0 in | Wt 183.6 lb

## 2012-12-05 DIAGNOSIS — K589 Irritable bowel syndrome without diarrhea: Secondary | ICD-10-CM

## 2012-12-05 DIAGNOSIS — IMO0001 Reserved for inherently not codable concepts without codable children: Secondary | ICD-10-CM

## 2012-12-05 DIAGNOSIS — G894 Chronic pain syndrome: Secondary | ICD-10-CM

## 2012-12-05 DIAGNOSIS — M1990 Unspecified osteoarthritis, unspecified site: Secondary | ICD-10-CM

## 2012-12-05 DIAGNOSIS — K5730 Diverticulosis of large intestine without perforation or abscess without bleeding: Secondary | ICD-10-CM

## 2012-12-05 DIAGNOSIS — M899 Disorder of bone, unspecified: Secondary | ICD-10-CM

## 2012-12-05 DIAGNOSIS — I10 Essential (primary) hypertension: Secondary | ICD-10-CM

## 2012-12-05 DIAGNOSIS — M8580 Other specified disorders of bone density and structure, unspecified site: Secondary | ICD-10-CM

## 2012-12-05 DIAGNOSIS — E559 Vitamin D deficiency, unspecified: Secondary | ICD-10-CM

## 2012-12-05 DIAGNOSIS — F489 Nonpsychotic mental disorder, unspecified: Secondary | ICD-10-CM

## 2012-12-05 DIAGNOSIS — E7800 Pure hypercholesterolemia, unspecified: Secondary | ICD-10-CM

## 2012-12-05 DIAGNOSIS — K219 Gastro-esophageal reflux disease without esophagitis: Secondary | ICD-10-CM

## 2012-12-05 NOTE — Patient Instructions (Signed)
Today we updated your med list in our EPIC system...    Continue your current medications the same...  Let's get on track w/ our diet & exercise program...    LOW CARB & LOW FAT diet...    Look up "the glycemic index" in [employer] & strive to avoid foods w/ the high glycemic index...  Call for any questions...  Let's plan a follow up visit in 71mo, sooner if needed for problems.Marland KitchenMarland Kitchen

## 2012-12-05 NOTE — Progress Notes (Signed)
Subjective:    Patient ID: Tammy Mullins, female    DOB: [birth year], 62 y.o.   MRN: [id number]  HPI 62 y/o BF here for a follow up visit... she has multiple medical problems as noted below...   ~  we have prev filled out disability papers at her request-  she is disabled due to multiple medical problems and DJD/ Fibromyalgia/ Chronic Pain syndrome/ and Psychiatric issues... these problems have been evaluated and are followed by mult other practitioners including: DrDeveshwar (Rheum), DrNitka (Ortho), DrHirsh (Neurosurg), and [facility] ([facility])...  ~  May 23, 2011:  50mo ROV & Tammy Mullins is c/o her FM & HAs noting "my FM is getting worse"; as noted she is followed by Ortho, Rheum, & Neurosurg for her DJD, gout, FM, & chronic pain> on Allopurinol, Vicodin, Robaxin/ Flexeril, Neurontin, Cymbalta; plus Psyche- DrPoulas- on Wellbutrin & Seroquel;  She is encouraged to start a program of gentle stretching exercises etc...    BP controlled on Metop, Amlod, Losar; she is followed in the [facility]... See prob list below>>  ~  October 05, 2011:  50mo ROV & Tammy Mullins is c/o discomfort in her back, shoulders, wrist, etc "I feel so bad" she says & doesn't want to get out of bed; we reviewed her meds- Neurontin, Vicodin, Flexeril/ Robaxin, Allopurinol, MVI; and our last note from DrDeveshwar 10/12 (FM, Gout, DJD, insomnia & fatigue)- they added Cymbalta; encouraged pt to maintain close f/u w/ her Rheumatologist for her condition...    >HBP treated w/ Metop, Amlodip, Losartan> BP= 146/92 & encouraged to take meds every day & work on wt reduction...    >Hx MVP but she denies CP (other rhan FM discomfort), palpit, SOB, edema, etc...    >Trying to control Chol via the [facility] Clinic w/ FishOil + diet/exerc; she tried Crestor but states it made her nauseated & she refuses to take it even in low dose; last FLP 3/13 not at goals- she declines med rx.Marland Kitchen    >GI> on Omep & Bentyl; followed by [employer]; states  she's doing OK w/o abd pain, n/v, c/d, blood in stool...    >She is still followed by Lindaann Slough for Psychiatry> she remains on Wellbutrin, Seroquel, Cymbalta... We reviewed prob list, meds, xrays and labs> see below fort updates >>  ~  June 06, 2012:  58mo ROV & Tammy Mullins has been under a lot of stress w/ the passing of her favorite niece age 72, and the breakup of a long term relationship; she has developed some AtypCP- heavy feeling, sore&tender, worse w/ certain movements, and sl better w/ rest/ heat/ & her pain meds; we discussed checking CXR, EKG, & fasting blood work... We reviewed the following medical problems during today's office visit >>     HBP> on MetopER100, Amlod10, Losar100; BP= 124/90 & she says usually better at home & feels the stress has raised it some today; reminded to elim sodium & get wt down!     Hx MVP> she gets occas sharp fleeting CP & palpit; asked to continue the current meds including the MetopER...     Chol> on diet + FishOil; prev treated via [facility]- tried Crestor but states it made her nauseated & she refuses to take it even in low dose; FLP 4/14 showed TChol 223, TG 237, HDL 37, LDL 123    GI> GERD, Barrett's, Divertics, IBS> on Omep20 & Bentyl20Tid prn; followed by [employer]; states she's doing OK w/o abd pain, dysphagia, n/v, c/d, blood in  stool...    DJD, Gout, FM, Chr pain syndrome> on Vicodin prn, Allopurinol300Bid, Neurontin300Tid, Flex10Qhs, Robaxin500Tid prn; followed by DrDeveshwar & pt reports seen 3/14- no changes made, DrD wants her in therapy...     Osteopenia, Vit D defic> BMD 1/14 showed TScore -1.6 in right [facility]; labs 4/14 showed Vit D = 28 & pt rec to re-rt Vit D OTC 2000u daily + wt bearing exercise...    Psychiatry> she remains on WellbutrinXL300, Seroquel300-2HS, Cymbalta60Bid; followed by DrPoulis who writes all these meds, we do not have notes from them ... We reviewed prob list, meds, xrays and labs> see below for updates >> Time spent: 45  min... CXR 4/14 shows normal heart size, clear lungs, DJD spine, NAD.Marland KitchenMarland Kitchen  EKG 4/14 showed NSR, rate80, wnl, NAD...  LABS 4/14:  FLP- not at goals on diet;  Chems- wnl;  CBC- wnl;  TSH=1.49;  VitD=28 & rec to take VitD 2000u daily...  ~  December 05, 2012:  29mo ROV & Tammy Mullins had a URI recently- given Flonase & reports much improved;  She was recently sen by [employer] for rheum- note pending, pt c/o knee pain, on several meds as below;  We reviewed the following medical problems during today's office visit >>     HBP> on MetopER100, Amlod10, Losar100; BP= 146/92 but hasn't taken meds yet today & feels the stress has raised it some w/ signif other med issues (400lbs, HBP, Chol, DM, etc)...    Hx MVP> she gets occas sharp fleeting CP & palpit; asked to continue the current meds including the MetopER; avoid caffeine, etc...    Chol> on diet + FishOil; prev treated via [facility]- tried Crestor but states it made her nauseated & she refuses to take it even in low dose; FLP 4/14 showed TChol 223, TG 237, HDL 37, LDL 123, we reviewed diet & exercise...    GI> GERD, Barrett's, Divertics, IBS> on Omep20 & Bentyl20Tid prn; followed by DrGessner; states she's doing OK w/o abd pain, dysphagia, n/v, c/d, blood in stool...    DJD, Gout, FM, Chr pain syndrome> on Vicodin prn, Allopurinol300Bid?, Neurontin300Tid, Flex10Qhs, Robaxin500Tid prn; followed by DrDeveshwar & pt seen 9/14- note pending, no changes made to her meds...    Osteopenia, Vit D defic> BMD 1/14 showed TScore -1.6 in right [facility]; labs 4/14 showed Vit D = 28 & pt rec to re-start Vit D OTC 2000u daily + wt bearing exercise...    Psychiatry> she remains on WellbutrinXL300, Seroquel300-2HS, Cymbalta60Bid; followed by DrPoulis who writes all these meds, we do not have notes from them ... We reviewed prob list, meds, xrays and labs> see below for updates >> already had flu vaccine in Sept...          Problem List:  ALLERGIC RHINITIS (ICD-477.9) - she  uses OTC antihistamines, mucinex, saline and FLONASE for her sinuses...  Hx SIALADENITIS>  Presented w/ right submandib gland swollen & min tender 9/12; ducts appear intact & "lemon test" was neg w/o incr pain/ swelling; treated w/ warm compresses, Augmentin 875mg  Bid, Sialogogues w/ lemon drops/ gum/ etc...  HYPERTENSION (ICD-401.9) - on ASA 81mg /d, TOPROL XL 100/d,  AMLODIPINE 10mg /d,  LOSARTAN 100mg /d...  ~  9/12: BP= 146/88 and feeling OK, tolerating meds well... denies HA, visual changes, CP, palipit, syncope, dyspnea, edema, etc... ~  3/13: BP= 138/98 & she is reminded to take meds regularly every day, no salt, & work on weight reduction... ~  8/13: BP= 146/92 & supposed to be on  her regularly... ~  4/14: on MetopER100, Amlod10, Losar100; BP= 124/90 & she says usually better at home & feels the stress has raised it some today; reminded to elim sodium & get wt down!  ~  10/14: on MetopER100, Amlod10, Losar100; BP= 146/92 but hasn't taken meds yet today & feels the stress has raised it some w/ signif other med issues (400lbs, HBP, Chol, DM, etc)   MITRAL VALVE PROLAPSE, HX OF (ICD-V12.50) - notes some intermittent sharp CP, but denies palpit/ ch in SOB/ edema/ etc...  HYPERCHOLESTEROLEMIA (ICD-272.0) - on diet + OMEGA-3 Fish Oil; she was tried on Crestor5mg MWF but INTOL w/ severe muscle pain & had to stop (pain resolved off med)... ~  FLP 10/06 by her GYN showed TChol 254, TG 364, HDL 38, LDL 143 ~  FLP 2/10 showed TChol 221, TG 194, HDL 37, LDL 130... rec> better diet, she refuses statin meds. ~  FLP 5/11 showed TChol 220, TG 192, HDL 40, LDL 133 ~  FLP 6/12 showed TChol 261, TG 237, HDL 48, LDL 162... rec refer to [facility] for management ~  [facility] has been working w/ her & checking FLP Q42mo- see flow chart... ~  FLP 3/13 on diet+FishOil showed TChol 221, TG 171, HDL 41, LDL 128... "it's the crackers & the cheese" she says... ~  FLP 4/14 on diet+FishOil showed TChol 223, TG  237, HDL 37, LDL 123  GERD (ICD-530.81) & ? of BARRETTS ESOPHAGUS (ICD-530.85) - long hx of GERD symptoms and followed by DrGessner for GI... she takes OMEPRAZOLE 20mg /d + GasX etc...  ~  last EGD 9/08 showed sm HH and ?1cm segment of Barrett's but bx was neg... therefore routine f/u suggested...   DIVERTICULOSIS, COLON (ICD-562.10) & IRRITABLE BOWEL SYNDROME (ICD-564.1) - she takes DICYCLOMINE 20mg Tid from [employer]...  ~  colonoscopy 11/02 showed mild divertics, otherw neg...  ~  10/09:  f/u GI eval by DrGessner due to symptoms of pain, diarrhea, bloating> f/u colonoscopy was normal & bx= neg, therefore felt to all be c/w IBS...  GYN >> followed by DrFernandez & seen 1/14> his note is reviewed...  OSTEOARTHRITIS (ICD-715.90) GOUT (ICD-274.9) FIBROMYALGIA (ICD-729.1) DE QUERVAIN'S TENOSYNOVITIS (ICD-727.04) CHRONIC PAIN SYNDROME (ICD-338.4) - as noted above>  these problems are followed by various specialists including:  DrDeveshwar (Rheum), DrNitka/Caffrey (Ortho), DrHirsh (Neurosurg)...  she is currently taking VICODIN Prn,  ROBAXIN 500mg Tid (uses Prn),  FLEXERIL 10mg - 1-2 Qhs, ALLOPURINOL 300mg - 1.5 tabs daily,  COLCRYS 0.6mg Bid Prn, & NEURONTIN 300mg Tid (she takes Prn)... ~  She reports prev neck surg w/ plate from DrHirsh, and left rotator cuff surg by DrCaffrey... ~  11/10:  she signed a narcotic contract w/ [employer]... ~  2/11:  note from DrDeveshwar reviewed & pain med Rx noted... ~  6/12:  Labs showed Uric= 6.4 ~  10/12:  Note from DrDeveshwar reviewed> FM, Gout, DJD... ~  8/13:  Note from DrDeveshwar is reviewed> active FM w/ generalized pain & trigger points, DJD & LBP, treated w/ Robaxin & hydrocodone... ~  4/14:  on Vicodin prn, Allopurinol300Bid, Neurontin300Tid, Flex10Qhs, Robaxin500Tid prn; followed by DrDeveshwar & pt reports seen 3/14- no changes made, DrD wants her in therapy...  ~  9/14: on Vicodin prn, Allopurinol300Bid?, Neurontin300Tid, Flex10Qhs, Robaxin500Tid prn;  seen by DrDeveshwar- note pending, pt reports no change in meds...   VIT D DEFICIENCY>  She is stopped her prev VitD 50K weekly on her own... ~  Vit D level 2/10 here = 17... rec to start  Vit D pplementation... ~  BMD 11/10 by DrGottsegen showed TScores -0.3 in Spine, and -1.4 in left FemNeck ~  8/13:  She had stopped her VitD 50K weekly supplement, she's not sure why?, needs f/u VitD level & decision re restart... ~  BMD 1/14 showed TScores -0.8 in Spine and -1.6 in right FemNeck... ~  Labs 4/14 showed Vit D = 28 and she is asked, once again, to take Vit D supplement ~2000u daily...  ATTENTION DEFICIT DISORDER (ICD-314.00) PSYCHIATRIC DISORDER (ICD-300.9) ANXIETY (ICD-300.00) - as noted above>  these problems are followed and treated by [facility] we do not have notes from these doctors but the pt states that she is prev being treated w/ Adderall, WellbutrinXR, Cymbalta, Seroquel, and Rozerem... ~  2/10:  they make freq adjustments & she indicates that she is off the Adderall & Rozerum now... ~  11/10: she indicates stable on the Wellbutrin, Cymbalta, Seroquel... ~  5/11:  pt indicates that she is stable on WELLBUTRIN XL 300mg /d,  CYMBALTA 60mg Bid, SEROQUEL 300mg - 2Qhs... ~  4/14:  she remains on WellbutrinXL300, Seroquel300-2HS, Cymbalta60Bid; followed by DrPoulis who writes all these meds, we do not have notes from them. ~  10/14: she reports same meds from [facility], stable, we do not have notes from them...  PRURITUS (ICD-698.9) - she saw DrWoods w/ Hydroxyzine perscribed...   Past Surgical History  Procedure Laterality Date  . Rt  ankle    . Carpal tunnel release    . Rotator cuff repair    . C-spine    . Salpingoophorectomy  Left    ectopic 1980    Outpatient Encounter Prescriptions as of 12/05/2012  Medication Sig Dispense Refill  . allopurinol (ZYLOPRIM) 300 MG tablet Take 2 tabs  by mouth daily.      Marland Kitchen amLODipine (NORVASC) 10 MG tablet TAKE  1 TABLET DAILY  90 tablet  PRN  . aspirin 81 MG tablet Take 81 mg by mouth daily.        Marland Kitchen buPROPion (WELLBUTRIN XL) 300 MG 24 hr tablet Take 300 mg by mouth daily.        . Cholecalciferol (VITAMIN D) 2000 UNITS CAPS Take 1 capsule by mouth daily.      . cyclobenzaprine (FLEXERIL) 10 MG tablet Take 1 tablet (10 mg total) by mouth at bedtime.  30 tablet  5  . dicyclomine (BENTYL) 20 MG tablet Take 1 tablet (20 mg total) by mouth 3 (three) times daily as needed.  90 tablet  0  . DULoxetine (CYMBALTA) 60 MG capsule Take 60 mg by mouth 2 (two) times daily.        . fish oil-omega-3 fatty acids 1000 MG capsule Take 2 capsules daily      . fluticasone (FLONASE) 50 MCG/ACT nasal spray Place 2 sprays into the nose at bedtime.  48 g  3  . gabapentin (NEURONTIN) 300 MG capsule Take 300 mg by mouth 3 (three) times daily as needed.        Marland Kitchen HYDROcodone-acetaminophen (VICODIN) 5-500 MG per tablet Take 1 tablet by mouth every 6 (six) hours as needed.        Marland Kitchen losartan (COZAAR) 100 MG tablet Take 1 tablet (100 mg total) by mouth daily.  30 tablet  5  . methocarbamol (ROBAXIN) 500 MG tablet Take 500 mg by mouth 3 (three) times daily as needed. Take as directed by Dr. Corliss Skains.       . metoprolol succinate (TOPROL-XL) 100  MG 24 hr tablet TAKE 1 TABLET DAILY  90 tablet  PRN  . Multiple Vitamins-Minerals (WOMENS MULTIVITAMIN PLUS PO) Take 1 tablet by mouth daily.      Marland Kitchen omeprazole (PRILOSEC) 20 MG capsule TAKE 1 CAPSULE EVERY DAY  90 capsule  PRN  . QUEtiapine (SEROQUEL) 300 MG tablet Take 600 mg by mouth at bedtime.         No facility-administered encounter medications on file as of 12/05/2012.    Allergies  Allergen Reactions  . Crestor [Rosuvastatin Calcium]     Causes severe body aches    Current Medications, Allergies, Past Medical History, Past Surgical History, Family History, and Social History were reviewed in [employer] record.    Review of Systems        See HPI -  all other systems neg except as noted... The patient complains of right facial swelling & dyspnea on exertion.  The patient denies anorexia, fever, weight loss, weight gain, vision loss, decreased hearing, hoarseness, chest pain, syncope, peripheral edema, prolonged cough, headaches, hemoptysis, abdominal pain, melena, hematochezia, severe indigestion/heartburn, hematuria, incontinence, muscle weakness, suspicious skin lesions, transient blindness, difficulty walking, depression, unusual weight change, abnormal bleeding, enlarged lymph nodes, and angioedema.   Objective:   Physical Exam     WD, WN, 62 y/o BF in NAD... GENERAL:  Alert & oriented; pleasant & cooperative...  HEENT:  [place]/AT, EOM-wnl, PERRLA, EACs-clear, TMs-wnl, NOSE-clear, THROAT-clear, sl dry MMs... NECK:  Supple w/ fairROM; no JVD; normal carotid impulses w/o bruits; no thyromegaly or nodules palpated; no lymphadenopathy. +swollen right submandib glans w/o obvious sign of stone in duct etc; parotids ok... CHEST:  Clear to P & A; without wheezes/ rales/ or rhonchi heard... HEART:  Regular Rhythm; without murmurs/ rubs/ or gallops detected...  ABDOMEN:  Soft & nontender; normal bowel sounds; no organomegaly or masses palpated... EXT: without deformities, mult bilat trigger points, mild arthritic changes; no varicose veins/ +venous insuffic/ tr edema. NEURO:  CN's intact; motor testing normal; sensory testing normal; gait normal & balance OK. DERM:  No lesions noted; no rash etc...  RADIOLOGY DATA:  Reviewed in the EPIC EMR & discussed w/ the patient...  LABORATORY DATA:  Reviewed in the EPIC EMR & discussed w/ the patient...   Assessment & Plan:    HBP>  Controlled on her 3 med regimen, continue same, encouraged to take every day, no salt, get wt down...Marland KitchenMarland KitchenMarland Kitchen  CHOL>  She hasn't lost any wt despite diet counseling etc; LC tried Cres5MWF but she proved INTOL w/ severe muscle symptoms that resolved off this med & she refuses chol  rx...  GERD>  Followed by Rodena Medin for GI on Omeprazole for her reflux symptoms & ?Barrett's...  Divertics/ IBS>  She has Bentyl to use for abd cramping etc...   RHEUM>  DJD/ Gout/ FM/ etc>  Managed by [employer] w/ help from DrCaffrey, DrHirsh, etc;  Pt has narcotic contract w/ DrD & stable on current 6 med regimen noted above...  Psyche>  Followed by Kandis Nab on 3 med regimen & appears stable...   Patient's Medications  New Prescriptions   No medications on file  Previous Medications   ALLOPURINOL (ZYLOPRIM) 300 MG TABLET    Take 2 tabs  by mouth daily.   AMLODIPINE (NORVASC) 10 MG TABLET    TAKE 1 TABLET DAILY   ASPIRIN 81 MG TABLET    Take 81 mg by mouth daily.     BUPROPION (WELLBUTRIN XL) 300 MG 24 HR TABLET  Take 300 mg by mouth daily.     CHOLECALCIFEROL (VITAMIN D) 2000 UNITS CAPS    Take 1 capsule by mouth daily.   CYCLOBENZAPRINE (FLEXERIL) 10 MG TABLET    Take 1 tablet (10 mg total) by mouth at bedtime.   DICYCLOMINE (BENTYL) 20 MG TABLET    Take 1 tablet (20 mg total) by mouth 3 (three) times daily as needed.   DULOXETINE (CYMBALTA) 60 MG CAPSULE    Take 60 mg by mouth 2 (two) times daily.     FISH OIL-OMEGA-3 FATTY ACIDS 1000 MG CAPSULE    Take 2 capsules daily   FLUTICASONE (FLONASE) 50 MCG/ACT NASAL SPRAY    Place 2 sprays into the nose at bedtime.   GABAPENTIN (NEURONTIN) 300 MG CAPSULE    Take 300 mg by mouth 3 (three) times daily as needed.     HYDROCODONE-ACETAMINOPHEN (VICODIN) 5-500 MG PER TABLET    Take 1 tablet by mouth every 6 (six) hours as needed.     LOSARTAN (COZAAR) 100 MG TABLET    Take 1 tablet (100 mg total) by mouth daily.   METHOCARBAMOL (ROBAXIN) 500 MG TABLET    Take 500 mg by mouth 3 (three) times daily as needed. Take as directed by Dr. Corliss Skains.    METOPROLOL SUCCINATE (TOPROL-XL) 100 MG 24 HR TABLET    TAKE 1 TABLET DAILY   MULTIPLE VITAMINS-MINERALS (WOMENS MULTIVITAMIN PLUS PO)    Take 1 tablet by mouth daily.   OMEPRAZOLE (PRILOSEC) 20  MG CAPSULE    TAKE 1 CAPSULE EVERY DAY   QUETIAPINE (SEROQUEL) 300 MG TABLET    Take 600 mg by mouth at bedtime.    Modified Medications   No medications on file  Discontinued Medications   No medications on file

## 2013-01-07 ENCOUNTER — Other Ambulatory Visit: Payer: Self-pay | Admitting: Gynecology

## 2013-01-07 DIAGNOSIS — Z1231 Encounter for screening mammogram for malignant neoplasm of breast: Secondary | ICD-10-CM

## 2013-02-11 ENCOUNTER — Ambulatory Visit (HOSPITAL_COMMUNITY): Payer: Medicare PPO

## 2013-02-14 ENCOUNTER — Ambulatory Visit (HOSPITAL_COMMUNITY)
Admission: RE | Admit: 2013-02-14 | Discharge: 2013-02-14 | Disposition: A | Discharge status: Home / Self Care | Payer: Medicare PPO | Source: Ambulatory Visit | Attending: Gynecology | Admitting: Gynecology

## 2013-02-14 DIAGNOSIS — Z1231 Encounter for screening mammogram for malignant neoplasm of breast: Secondary | ICD-10-CM

## 2013-03-05 ENCOUNTER — Telehealth: Payer: Self-pay | Admitting: Pulmonary Disease

## 2013-03-05 DIAGNOSIS — IMO0001 Reserved for inherently not codable concepts without codable children: Secondary | ICD-10-CM

## 2013-03-05 DIAGNOSIS — M1990 Unspecified osteoarthritis, unspecified site: Secondary | ICD-10-CM

## 2013-03-05 DIAGNOSIS — M109 Gout, unspecified: Secondary | ICD-10-CM

## 2013-03-05 NOTE — Telephone Encounter (Signed)
Order has been placed.

## 2013-03-06 ENCOUNTER — Telehealth: Payer: Self-pay | Admitting: Pulmonary Disease

## 2013-03-06 NOTE — Telephone Encounter (Signed)
Will check with rhonda tomorrow to see about the [facility] referral form that needs to be filled out and faxed over.

## 2013-03-13 NOTE — Telephone Encounter (Signed)
Spoke with rhonda and she will follow up with this tomorrow.  Will route to [facility] inbox.

## 2013-03-14 NOTE — Telephone Encounter (Signed)
Referral has been faxed to [facility] on 03/06/13. Still waiting on referral from [facility]. Rhonda J Cobb

## 2013-03-21 NOTE — Telephone Encounter (Signed)
Spoke with Dr. Arlean Hopping office and was advised that they did receive referral from [facility] for pt's appointment on 03/05/13. Authorization # [id number]. Nothing else needed on this message. Rhonda J Cobb

## 2013-05-15 ENCOUNTER — Other Ambulatory Visit: Payer: Self-pay | Admitting: Pulmonary Disease

## 2013-05-16 ENCOUNTER — Telehealth: Payer: Self-pay | Admitting: Pulmonary Disease

## 2013-05-16 NOTE — Telephone Encounter (Signed)
Called and spoke with pt and she is aware of SN no longer seeing primary care as of April 1.  She wanted to see someone at [facility].  i have given her the number to this office and she will call to set up appt.  Advised pt to try and get set up around the time that her appt was with SN.  4-8 appt with SN has been cancelled.  Nothing further is needed.

## 2013-05-16 NOTE — Telephone Encounter (Signed)
lmtcb x1 for pt SN is retiring from [facility] 05/29/13. Need to find new PCP Pt has pending appt 06/05/13-- need to cancel.Marland KitchenMarland Kitchen

## 2013-05-20 ENCOUNTER — Other Ambulatory Visit: Payer: Self-pay | Admitting: Pulmonary Disease

## 2013-05-24 ENCOUNTER — Telehealth: Payer: Self-pay | Admitting: Pulmonary Disease

## 2013-05-24 MED ORDER — FLUTICASONE PROPIONATE 50 MCG/ACT NA SUSP: 2.0000 | Freq: Every day | NASAL | Status: DC

## 2013-05-24 MED ORDER — AMLODIPINE BESYLATE 10 MG PO TABS: ORAL_TABLET | ORAL | Status: DC

## 2013-05-24 MED ORDER — METOPROLOL SUCCINATE ER 100 MG PO TB24: ORAL_TABLET | ORAL | Status: DC

## 2013-05-24 MED ORDER — OMEPRAZOLE 20 MG PO CPDR: DELAYED_RELEASE_CAPSULE | ORAL | Status: DC

## 2013-05-24 NOTE — Telephone Encounter (Signed)
Called and lmom to make the pt aware that we have sent in refills to her mail order.  i lmom to make the pt aware and advised her to call back for any concerns or questions.

## 2013-06-05 ENCOUNTER — Ambulatory Visit: Payer: Medicare PPO | Admitting: Pulmonary Disease

## 2013-06-18 ENCOUNTER — Ambulatory Visit: Payer: Medicare PPO | Admitting: Family

## 2013-06-18 ENCOUNTER — Ambulatory Visit: Payer: Commercial Managed Care - HMO | Admitting: Family

## 2013-06-25 ENCOUNTER — Ambulatory Visit (INDEPENDENT_AMBULATORY_CARE_PROVIDER_SITE_OTHER): Payer: Commercial Managed Care - HMO | Admitting: Family

## 2013-06-25 ENCOUNTER — Encounter: Payer: Self-pay | Admitting: Family

## 2013-06-25 VITALS — BP 150/96 | HR 100 | Temp 98.2°F | Resp 18 | Ht 65.5 in | Wt 182.0 lb

## 2013-06-25 DIAGNOSIS — F329 Major depressive disorder, single episode, unspecified: Secondary | ICD-10-CM

## 2013-06-25 DIAGNOSIS — R0683 Snoring: Secondary | ICD-10-CM

## 2013-06-25 DIAGNOSIS — R0609 Other forms of dyspnea: Secondary | ICD-10-CM

## 2013-06-25 DIAGNOSIS — I10 Essential (primary) hypertension: Secondary | ICD-10-CM

## 2013-06-25 DIAGNOSIS — F3289 Other specified depressive episodes: Secondary | ICD-10-CM

## 2013-06-25 DIAGNOSIS — R0989 Other specified symptoms and signs involving the circulatory and respiratory systems: Secondary | ICD-10-CM

## 2013-06-25 DIAGNOSIS — E559 Vitamin D deficiency, unspecified: Secondary | ICD-10-CM

## 2013-06-25 DIAGNOSIS — F32A Depression, unspecified: Secondary | ICD-10-CM

## 2013-06-25 DIAGNOSIS — E785 Hyperlipidemia, unspecified: Secondary | ICD-10-CM

## 2013-06-25 DIAGNOSIS — K589 Irritable bowel syndrome without diarrhea: Secondary | ICD-10-CM

## 2013-06-25 DIAGNOSIS — G479 Sleep disorder, unspecified: Secondary | ICD-10-CM

## 2013-06-25 LAB — HEPATIC FUNCTION PANEL
ALBUMIN: 4.5 g/dL (ref 3.5–5.2)
ALT: 22 U/L (ref 0–35)
AST: 16 U/L (ref 0–37)
Alkaline Phosphatase: 86 U/L (ref 39–117)
BILIRUBIN TOTAL: 0.3 mg/dL (ref 0.2–1.2)
Bilirubin, Direct: 0.1 mg/dL (ref 0.0–0.3)
Indirect Bilirubin: 0.2 mg/dL (ref 0.2–1.2)
Total Protein: 7 g/dL (ref 6.0–8.3)

## 2013-06-25 LAB — BASIC METABOLIC PANEL
BUN: 10 mg/dL (ref 6–23)
CALCIUM: 10.2 mg/dL (ref 8.4–10.5)
CHLORIDE: 101 meq/L (ref 96–112)
CO2: 29 meq/L (ref 19–32)
Creat: 0.77 mg/dL (ref 0.50–1.10)
GLUCOSE: 114 mg/dL — AB (ref 70–99)
Potassium: 3.6 mEq/L (ref 3.5–5.3)
Sodium: 139 mEq/L (ref 135–145)

## 2013-06-25 LAB — LIPID PANEL
CHOL/HDL RATIO: 5.2 ratio
CHOLESTEROL: 206 mg/dL — AB (ref 0–200)
HDL: 40 mg/dL (ref >39)
LDL Cholesterol: 123 mg/dL — ABNORMAL HIGH (ref 0–99)
Triglycerides: 215 mg/dL — ABNORMAL HIGH (ref <150)
VLDL: 43 mg/dL — ABNORMAL HIGH (ref 0–40)

## 2013-06-25 NOTE — Patient Instructions (Signed)
Please complete lab work prior to leaving. You will be contacted about your sleep study. Follow up in 6 weeks for wellness visit.

## 2013-06-25 NOTE — Progress Notes (Signed)
Pre visit review using our clinic review tool, if applicable. No additional management support is needed unless otherwise documented below in the visit note. 

## 2013-06-25 NOTE — Progress Notes (Signed)
Subjective:    Patient ID: Tammy Mullins, female    DOB: April 20, 1950, 63 y.o.   MRN: [id number]  HPI  Tammy Mullins is a 63 yr old female who presents today to establish care.  Coughing- reports that her niece tells her she snores. Reports that she wakes up in the middle of the night coughing.  Wakes up tired.  Does not fall asleep during the day.   HTN-  On losartan, metoprolol, and amlodipine.   Hyperlipidemia- reports that she had myalgia on statin.    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  Has seen Dr. Estanislado Pandy and fibromyalgia.  Has hx of gout.    IBS- diarrhea predominant- uses bentyl every other day. Reports that this improves her symptoms.   Depression- on cymbalta and seroquel.  She is no longer on bupropion.  Reports that she sometimes she wakes up feeling depressed. Stopped bupropion due to cost.  Mom died in 03-27-1998 which seemed to worsen her depression.  Felt overwhelmed at that time by taking care of her dad at that time. She is followed at [facility].  She sees Bevelyn Ngo.  Vitamin D deficiency- she is currently on vit d supplement   Review of Systems See HPI  Past Medical History  Diagnosis Date  . Allergic rhinitis, cause unspecified   . Unspecified essential hypertension   . Personal history of unspecified circulatory disease   . Pure hypercholesterolemia   . Esophageal reflux   . Barrett's esophagus   . Diverticulosis of colon (without mention of hemorrhage)   . Irritable bowel syndrome   . Osteoarthrosis, unspecified whether generalized or localized, unspecified site   . Gout, unspecified   . Myalgia and myositis, unspecified   . Radial styloid tenosynovitis   . Chronic pain syndrome   . Attention deficit disorder without mention of hyperactivity   . Unspecified nonpsychotic mental disorder   . Anxiety state, unspecified   . Unspecified pruritic disorder     History   Social History  . Marital Status: Single    Spouse Name: N/A   Number of Children: N/A  . Years of Education: N/A   Occupational History  . retired    Social History Main Topics  . Smoking status: Never Smoker   . Smokeless tobacco: Never Used  . Alcohol Use: No  . Drug Use: Not on file  . Sexual Activity: No   Other Topics Concern  . 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            Past Surgical History  Procedure Laterality Date  . Rt  ankle    . Carpal tunnel release    . Rotator cuff repair    . C-spine    . Salpingoophorectomy  Left    ectopic 1980    Family History  Problem Relation Age of Onset  . Cirrhosis Father   . Cancer Maternal Grandmother     ?  . Lung disease Mother     ?   . Glaucoma Mother   . Glaucoma Brother   . Heart disease Sister   . Kidney disease Neg Hx   .  Diabetes Neg Hx     Allergies  Allergen Reactions  . Crestor [Rosuvastatin Calcium]     Causes severe body aches    Current Outpatient Prescriptions on File Prior to Visit  Medication Sig Dispense Refill  . allopurinol (ZYLOPRIM) 300 MG tablet Take 2 tabs  by mouth daily.      Marland Kitchen amLODipine (NORVASC) 10 MG tablet TAKE 1 TABLET DAILY  90 tablet  0  . aspirin 81 MG tablet Take 81 mg by mouth daily.        Marland Kitchen buPROPion (WELLBUTRIN XL) 300 MG 24 hr tablet Take 300 mg by mouth daily.        . Cholecalciferol (VITAMIN D) 2000 UNITS CAPS Take 1 capsule by mouth daily.      . cyclobenzaprine (FLEXERIL) 10 MG tablet TAKE 1 TABLET BY MOUTH EVERY NIGHT AT BEDTIME  30 tablet  0  . dicyclomine (BENTYL) 20 MG tablet Take 1 tablet (20 mg total) by mouth 3 (three) times daily as needed.  90 tablet  0  . DULoxetine (CYMBALTA) 60 MG capsule Take 60 mg by mouth 2 (two) times  daily.        . fish oil-omega-3 fatty acids 1000 MG capsule Take 2 capsules daily      . fluticasone (FLONASE) 50 MCG/ACT nasal spray Place 2 sprays into both nostrils at bedtime.  48 g  0  . gabapentin (NEURONTIN) 300 MG capsule Take 300 mg by mouth 3 (three) times daily as needed.        Marland Kitchen HYDROcodone-acetaminophen (VICODIN) 5-500 MG per tablet Take 1 tablet by mouth every 6 (six) hours as needed.        Marland Kitchen losartan (COZAAR) 100 MG tablet Take 1 tablet (100 mg total) by mouth daily.  30 tablet  5  . methocarbamol (ROBAXIN) 500 MG tablet Take 500 mg by mouth 3 (three) times daily as needed. Take as directed by Dr. Estanislado Pandy.       . metoprolol succinate (TOPROL-XL) 100 MG 24 hr tablet TAKE 1 TABLET DAILY  90 tablet  0  . Multiple Vitamins-Minerals (WOMENS MULTIVITAMIN PLUS PO) Take 1 tablet by mouth daily.      Marland Kitchen omeprazole (PRILOSEC) 20 MG capsule TAKE 1 CAPSULE EVERY DAY  90 capsule  0  . QUEtiapine (SEROQUEL) 300 MG tablet Take 600 mg by mouth at bedtime.         No current facility-administered medications on file prior to visit.    BP 150/96  Pulse 100  Temp(Src) 98.2 F (36.8 C) (Oral)  Resp 18  Ht 5' 5.5" (1.664 m)  Wt 182 lb (82.555 kg)  BMI 29.82 kg/m2  SpO2 99%       Objective:   Physical Exam  Constitutional: She is oriented to person, place, and time. She appears well-developed and well-nourished. No distress.  HENT:  Head: Normocephalic and atraumatic.  Cardiovascular: Normal rate and regular rhythm.   No murmur heard. Pulmonary/Chest: Effort normal and breath sounds normal. No respiratory distress. She has no wheezes. She has no rales. She exhibits no tenderness.  Lymphadenopathy:    She has no cervical adenopathy.  Neurological: She is alert and oriented to person, place, and time.  Psychiatric: She has a normal mood and affect. Her behavior is normal. Judgment and thought content normal.          Assessment & Plan:

## 2013-06-26 ENCOUNTER — Telehealth: Payer: Self-pay | Admitting: Family

## 2013-06-26 LAB — VITAMIN D 25 HYDROXY (VIT D DEFICIENCY, FRACTURES): VIT D 25 HYDROXY: 24 ng/mL — AB (ref 30–89)

## 2013-06-26 NOTE — Telephone Encounter (Signed)
Relevant patient education mailed to patient.  

## 2013-06-27 ENCOUNTER — Other Ambulatory Visit: Payer: Self-pay | Admitting: Family

## 2013-06-27 ENCOUNTER — Encounter: Payer: Self-pay | Admitting: Family

## 2013-06-27 DIAGNOSIS — G479 Sleep disorder, unspecified: Secondary | ICD-10-CM | POA: Insufficient documentation

## 2013-06-27 MED ORDER — VITAMIN D (ERGOCALCIFEROL) 1.25 MG (50000 UNIT) PO CAPS: 50000.0000 [IU] | ORAL_CAPSULE | ORAL | Status: DC

## 2013-06-27 NOTE — Assessment & Plan Note (Signed)
BP elevated today. Generally lower than today's reading. Continue current meds. If still elevated next visit plan to adjust BP meds.   BP Readings from Last 3 Encounters:  06/25/13 150/96  12/05/12 146/92  06/06/12 124/90

## 2013-06-27 NOTE — Assessment & Plan Note (Signed)
Stable with prn bentyl.

## 2013-06-27 NOTE — Assessment & Plan Note (Signed)
Will refer for sleep study

## 2013-06-27 NOTE — Assessment & Plan Note (Signed)
Check vit d level 

## 2013-06-27 NOTE — Assessment & Plan Note (Signed)
Fair control. Advised pt to follow up with psychiatry.

## 2013-07-02 ENCOUNTER — Telehealth: Payer: Self-pay | Admitting: *Deleted

## 2013-07-02 DIAGNOSIS — E559 Vitamin D deficiency, unspecified: Secondary | ICD-10-CM

## 2013-07-02 DIAGNOSIS — R739 Hyperglycemia, unspecified: Secondary | ICD-10-CM

## 2013-07-02 NOTE — Telephone Encounter (Signed)
Pt left message that we could leave detailed message on her voicemail.  Left message re: below instructions and to call if any questions. Lab orders entered.

## 2013-07-02 NOTE — Telephone Encounter (Signed)
Message copied by Ronny Flurry on Tue Jul 02, 2013  3:51 PM ------      Message from: O'SULLIVAN, MELISSA      Created: Thu Jun 27, 2013  8:49 PM       Also, please let pt know that her vitamin D level is low. I would like her to add a weekly vitamin D supplement.  Return to lab in 12 weeks for vitamin D level (dx vitamin D deficiency). Cholesterol is above goal.  Work hard on low fat/low cholesterol diet, exercise, and avoid concentrated sweets/white fluffy carbs as triglycerides also elevated.  Sugar mildly elevated, would like to screen for diabetes and have pt return for A1C please. ------

## 2013-07-03 ENCOUNTER — Telehealth: Payer: Self-pay | Admitting: Pulmonary Disease

## 2013-07-03 DIAGNOSIS — I10 Essential (primary) hypertension: Secondary | ICD-10-CM

## 2013-07-03 DIAGNOSIS — E7800 Pure hypercholesterolemia, unspecified: Secondary | ICD-10-CM

## 2013-07-03 NOTE — Telephone Encounter (Signed)
Called and spoke with pt and she stated that she was seen by Debbrah Alar at Dr. Owens Loffler office and they stated that they needed a referral placed to get her insurance to cover this OV.  She will be seeing them for new PCP now.  She is aware that any other referrals need to come from new PCP.

## 2013-07-04 ENCOUNTER — Telehealth: Payer: Self-pay | Admitting: Family

## 2013-07-04 DIAGNOSIS — M797 Fibromyalgia: Secondary | ICD-10-CM

## 2013-07-04 DIAGNOSIS — F419 Anxiety disorder, unspecified: Principal | ICD-10-CM

## 2013-07-04 DIAGNOSIS — F32A Depression, unspecified: Secondary | ICD-10-CM

## 2013-07-04 DIAGNOSIS — F329 Major depressive disorder, single episode, unspecified: Secondary | ICD-10-CM

## 2013-07-04 NOTE — Telephone Encounter (Signed)
Patient has [employer] and will need a referral to see Dr. Devashwar(rheumatology) and Dr. Lattie Haw Poula(psychiatry)

## 2013-07-31 ENCOUNTER — Ambulatory Visit: Payer: Medicare PPO | Admitting: Internal Medicine

## 2013-08-06 ENCOUNTER — Ambulatory Visit: Payer: Commercial Managed Care - HMO | Admitting: Family

## 2013-08-07 ENCOUNTER — Encounter (HOSPITAL_BASED_OUTPATIENT_CLINIC_OR_DEPARTMENT_OTHER): Payer: Commercial Managed Care - HMO

## 2013-08-11 ENCOUNTER — Encounter (HOSPITAL_BASED_OUTPATIENT_CLINIC_OR_DEPARTMENT_OTHER): Payer: Commercial Managed Care - HMO

## 2013-08-26 ENCOUNTER — Ambulatory Visit: Payer: Commercial Managed Care - HMO | Admitting: Family

## 2013-09-13 ENCOUNTER — Ambulatory Visit (HOSPITAL_BASED_OUTPATIENT_CLINIC_OR_DEPARTMENT_OTHER): Payer: Medicare PPO | Attending: Family

## 2013-09-13 VITALS — Ht 65.0 in | Wt 179.0 lb

## 2013-09-13 DIAGNOSIS — M549 Dorsalgia, unspecified: Secondary | ICD-10-CM | POA: Insufficient documentation

## 2013-09-13 DIAGNOSIS — G4733 Obstructive sleep apnea (adult) (pediatric): Secondary | ICD-10-CM | POA: Insufficient documentation

## 2013-09-13 DIAGNOSIS — R0609 Other forms of dyspnea: Secondary | ICD-10-CM | POA: Insufficient documentation

## 2013-09-13 DIAGNOSIS — R0989 Other specified symptoms and signs involving the circulatory and respiratory systems: Secondary | ICD-10-CM | POA: Insufficient documentation

## 2013-09-13 DIAGNOSIS — G8929 Other chronic pain: Secondary | ICD-10-CM | POA: Insufficient documentation

## 2013-09-13 DIAGNOSIS — R0683 Snoring: Secondary | ICD-10-CM

## 2013-09-16 ENCOUNTER — Telehealth: Payer: Self-pay | Admitting: Family

## 2013-09-16 ENCOUNTER — Telehealth: Payer: Self-pay

## 2013-09-16 DIAGNOSIS — G4710 Hypersomnia, unspecified: Secondary | ICD-10-CM

## 2013-09-16 DIAGNOSIS — G4730 Sleep apnea, unspecified: Secondary | ICD-10-CM

## 2013-09-16 DIAGNOSIS — G4733 Obstructive sleep apnea (adult) (pediatric): Secondary | ICD-10-CM | POA: Insufficient documentation

## 2013-09-16 NOTE — Telephone Encounter (Signed)
Pt was contacted VM left asking Pt to contact office for test results

## 2013-09-16 NOTE — Telephone Encounter (Signed)
Please contact pt and let her know that sleep study shows severe sleep apnea.  I would recommend that she have a CPAP titration study so we can fit her with a mask and optimize her settings.  Also should work on weight loss and avoid driving while sleepy.

## 2013-09-16 NOTE — Sleep Study (Signed)
[place]   NAME: Tammy Mullins  DATE OF BIRTH: [birth year]  MEDICAL RECORD [id number]  LOCATION: [facility]   PHYSICIAN: Sabre Leonetti V.   DATE OF STUDY: 09/13/13   SLEEP STUDY TYPE: Nocturnal Polysomnogram   REFERRING PHYSICIAN: Rigoberto Noel, MD   INDICATION FOR STUDY:  63 year old with chronic back pain, depression, non-refreshing sleep loud snoring and excessive daytime fatigue. At the time of this study ,they weighed 179 pounds with a height of 5 ft 5 inches and the BMI of 30, neck size of 13 inches. Epworth sleepiness score was 0   This nocturnal polysomnogram was performed with a sleep technologist in attendance. EEG, EOG,EMG and respiratory parameters recorded. Sleep stages, arousals, limb movements and respiratory data was scored according to criteria laid out by the American Academy of sleep medicine.   SLEEP ARCHITECTURE: Lights out was at 22-31 PM and lights on was at 454 AM. Total sleep time was 241 minutes with a sleep period time of 308 minutes and a sleep efficiency of 63 %. Sleep latency was 71 minutes with latency to REM sleep of 190 minutes and wake after sleep onset of 71 minutes. . Sleep stages as a percentage of total sleep time was N1 -11 %,N2- 83 % and REM sleep 6 % ( 14 minutes) . The longest period of REM sleep was around 3 AM.   AROUSAL DATA : There were 22  arousals with an arousal index of 5 events per hour. Most of these were spontaneous & 12 were associated with respiratory events  RESPIRATORY DATA: There were 58 obstructive apneas, 1 central apneas, 0 mixed apneas and 67 hypopneas with apnea -hypopnea index of 31 events per hour. There were 2 RERAs with an RDI of 32 events per hour. Events were worse when supine. Nonsupine AHI was 7 events per hour. Supine sleep was  noted  MOVEMENT/PARASOMNIA: There were 0 PLMS with a PLM index of 0 events per hour. The PLM arousal index was 0 per hour.  OXYGEN DATA: The  lowest desaturation was 74 % during nREM sleep and the desaturation index was 41 per hour.   CARDIAC DATA: The low heart rate was 35 beats per minute. The high heart rate recorded was an artifact. No arrhythmias were noted   DISCUSSION -Loud snoring was noted . She did not meet criteria for CPAP intervention due to inadequate sleep time due to prolonged sleep latency due to back pain.Marland Kitchen She was desensitized with a small fullface mask  IMPRESSION :  1. severe obstructive sleep apnea with hypopneas causing sleep fragmentation and moderate oxygen desaturation. This was predominantly during supine sleep 2. No evidence of cardiac arrhythmias,periodic limb movements or behavioral disturbance during sleep.  3. Sleep efficiency was poor due to prolonged sleep latency, which she attributed to her chronic back pain  RECOMMENDATION:  1. Treatment options for this degree of sleep disordered breathing include weight loss, CPAP therapy and/ or oral appliance. A CPAP titration study can be scheduled 2. Patient should be cautioned against driving when sleepy  3. They should be asked to avoid medications with sedative side effects    Twila Rappa V.  Diplomate, Tax adviser of Sleep Medicine    ELECTRONICALLY SIGNED ON: 09/16/2013  [facility]  PH: [phone] FX: (336) [place]

## 2013-09-17 NOTE — Telephone Encounter (Signed)
Notified pt and she voices understanding and is willing to proceed with titration study.

## 2013-09-20 ENCOUNTER — Encounter: Payer: Self-pay | Admitting: Family

## 2013-09-20 ENCOUNTER — Ambulatory Visit (INDEPENDENT_AMBULATORY_CARE_PROVIDER_SITE_OTHER): Payer: Commercial Managed Care - HMO | Admitting: Family

## 2013-09-20 ENCOUNTER — Telehealth: Payer: Self-pay | Admitting: Family

## 2013-09-20 VITALS — BP 126/76 | HR 69 | Temp 98.0°F | Resp 16 | Ht 65.5 in | Wt 177.1 lb

## 2013-09-20 DIAGNOSIS — F329 Major depressive disorder, single episode, unspecified: Secondary | ICD-10-CM

## 2013-09-20 DIAGNOSIS — I10 Essential (primary) hypertension: Secondary | ICD-10-CM

## 2013-09-20 DIAGNOSIS — Z0000 Encounter for general adult medical examination without abnormal findings: Secondary | ICD-10-CM

## 2013-09-20 DIAGNOSIS — Z23 Encounter for immunization: Secondary | ICD-10-CM

## 2013-09-20 DIAGNOSIS — M109 Gout, unspecified: Secondary | ICD-10-CM | POA: Insufficient documentation

## 2013-09-20 DIAGNOSIS — G4733 Obstructive sleep apnea (adult) (pediatric): Secondary | ICD-10-CM

## 2013-09-20 DIAGNOSIS — E7800 Pure hypercholesterolemia, unspecified: Secondary | ICD-10-CM

## 2013-09-20 DIAGNOSIS — F3289 Other specified depressive episodes: Secondary | ICD-10-CM

## 2013-09-20 DIAGNOSIS — E785 Hyperlipidemia, unspecified: Secondary | ICD-10-CM

## 2013-09-20 DIAGNOSIS — F32A Depression, unspecified: Secondary | ICD-10-CM

## 2013-09-20 DIAGNOSIS — E559 Vitamin D deficiency, unspecified: Secondary | ICD-10-CM

## 2013-09-20 NOTE — Assessment & Plan Note (Signed)
Obtain follow up lipid panel and LFT.

## 2013-09-20 NOTE — Progress Notes (Signed)
Pre visit review using our clinic review tool, if applicable. No additional management support is needed unless otherwise documented below in the visit note. 

## 2013-09-20 NOTE — Progress Notes (Signed)
Subjective:    Patient ID: Tammy Mullins, female    DOB: January 08, 1951, 63 y.o.   MRN: [id number]  HPI  Pt here for fasting medicare wellness exam. Last tetanus vaccine greater than 10 years, last colonoscopy 2009, last DEXA 03/23/12 and last mammogram 02/2012. Has never had pneumonia vaccine. Last EKG in [facility] 05/2012.  Subjective:   Patient here for Medicare annual wellness visit and management of other chronic and acute problems.  Patient presents today for complete physical.  Immunizations: tetanus and prevnar today.  Diet: reports that she started a protein shake diet last week.  Her friend had lap band.   Wt Readings from Last 3 Encounters:  09/20/13 177 lb 1.3 oz (80.323 kg)  09/13/13 179 lb (81.194 kg)  06/25/13 182 lb (82.555 kg)  Exercise: reports some exercise (walks her dog) Colonoscopy: 2009- due for follow up in 2019 Dexa:  Up to date.   Pap Smear: 1/14 Mammogram: due 12/15  HTN- denies CP/sob or swelling.   BP Readings from Last 3 Encounters:  09/20/13 126/76  06/25/13 150/96  12/05/12 146/92   Depression-  Reports well controlled.  No longer taking wellbutrin. Taking cymbalta and seroquel.followed by psychiatry.  OSA- will have CPAP titration.   Hyperlipidemia-  She is fasting.    Gout- some hand pain.     Risk factors:   Roster of Physicians Providing Medical Care to Patient: Dr.  Toney Rakes Dr. Rebecka Apley [facility].   Activities of Daily Living  In your present state of health, do you have any difficulty performing the following activities? Preparing food and eating?: No  Bathing yourself: No  Getting dressed: No  Using the toilet:No  Moving around from place to place: No  In the past year have you fallen or had a near fall?:No    Home Safety: Has smoke detector and wears seat belts. No firearms. No excess sun exposure.  Diet and Exercise  Current exercise habits:  Dietary issues discussed: healthy diet   Depression Screen    (Note: if answer to either of the following is "Yes", then a more complete depression screening is indicated)  Q1: Over the past two weeks, have you felt down, depressed or hopeless?no  Q2: Over the past two weeks, have you felt little interest or pleasure in doing things? no   The following portions of the patient's history were reviewed and updated as appropriate: allergies, current medications, past family history, past medical history, past social history, past surgical history and problem list.   Objective:   Vision: see nursing Hearing: able to hear forced whisper at 6 feet Body mass index: Body mass index is 29.01 kg/(m^2).  Cognitive Impairment Assessment: cognition, memory and judgment appear normal.   Physical Exam  Constitutional: She is oriented to person, place, and time. She appears well-developed and well-nourished. No distress.  HENT:  Head: Normocephalic and atraumatic.  Right Ear: Tympanic membrane and ear canal normal.  Left Ear: Tympanic membrane and ear canal normal.  Mouth/Throat: Oropharynx is clear and moist.  Eyes: Pupils are equal, round, and reactive to light. No scleral icterus.  Neck: Normal range of motion. No thyromegaly present.  Cardiovascular: Normal rate and regular rhythm.   No murmur heard. Pulmonary/Chest: Effort normal and breath sounds normal. No respiratory distress. He has no wheezes. She has no rales. She exhibits no tenderness.  Abdominal: Soft. Bowel sounds are normal. He exhibits no distension and no mass. There is no tenderness. There is no rebound and  no guarding.  Musculoskeletal: She exhibits no edema.  Lymphadenopathy:    She has no cervical adenopathy.  Neurological: She is alert and oriented to person, place, and time. She has normal reflexes. She exhibits normal muscle tone. Coordination normal.  Skin: Skin is warm and dry.  Psychiatric: She has a normal mood and affect. Her behavior is normal. Judgment and thought content normal.   Breasts: Examined lying Right: Without masses, retractions, discharge or axillary adenopathy.  Left: Without masses, retractions, discharge or axillary adenopathy.  Pelvic: deferred          Assessment & Plan:     Assessment:   Medicare wellness utd on preventive parameters   Plan:   During the course of the visit the patient was educated and counseled about appropriate screening and preventive services including:       Screening mammography  Vaccines / LABS  Tdap/prevnar today Patient Instructions (the written plan) was given to the patient.      Review of Systems     Objective:   Physical Exam        Assessment & Plan:

## 2013-09-20 NOTE — Assessment & Plan Note (Signed)
New diagnosis, will be having CPAP titration study.

## 2013-09-20 NOTE — Telephone Encounter (Signed)
Is is possible to add uric acid dx gout to today's blood draw please?

## 2013-09-20 NOTE — Patient Instructions (Signed)
Contact your insurance and verify coverage for shingles shot. Then schedule nurse visit in 1 month for administration. Complete lab work prior to leaving. Follow  Up in 3 months.

## 2013-09-20 NOTE — Assessment & Plan Note (Signed)
Well controlled, management per psych.

## 2013-09-20 NOTE — Assessment & Plan Note (Signed)
BP stable on current meds. Continue same,obtain follow up bmet.  

## 2013-09-20 NOTE — Telephone Encounter (Signed)
Test/dx have been added.

## 2013-09-21 LAB — HEPATIC FUNCTION PANEL
ALT: 17 U/L (ref 0–35)
AST: 17 U/L (ref 0–37)
Albumin: 4.3 g/dL (ref 3.5–5.2)
Alkaline Phosphatase: 72 U/L (ref 39–117)
BILIRUBIN INDIRECT: 0.2 mg/dL (ref 0.2–1.2)
BILIRUBIN TOTAL: 0.3 mg/dL (ref 0.2–1.2)
Bilirubin, Direct: 0.1 mg/dL (ref 0.0–0.3)
Total Protein: 6.9 g/dL (ref 6.0–8.3)

## 2013-09-21 LAB — BASIC METABOLIC PANEL WITH GFR
BUN: 11 mg/dL (ref 6–23)
CALCIUM: 9.5 mg/dL (ref 8.4–10.5)
CO2: 29 mEq/L (ref 19–32)
Chloride: 104 mEq/L (ref 96–112)
Creat: 0.9 mg/dL (ref 0.50–1.10)
GFR, Est African American: 78 mL/min
GFR, Est Non African American: 67 mL/min
GLUCOSE: 95 mg/dL (ref 70–99)
POTASSIUM: 4 meq/L (ref 3.5–5.3)
SODIUM: 141 meq/L (ref 135–145)

## 2013-09-21 LAB — LIPID PANEL
Cholesterol: 185 mg/dL (ref 0–200)
HDL: 39 mg/dL — AB (ref >39)
LDL CALC: 112 mg/dL — AB (ref 0–99)
Total CHOL/HDL Ratio: 4.7 Ratio
Triglycerides: 170 mg/dL — ABNORMAL HIGH (ref <150)
VLDL: 34 mg/dL (ref 0–40)

## 2013-09-21 LAB — VITAMIN D 25 HYDROXY (VIT D DEFICIENCY, FRACTURES): Vit D, 25-Hydroxy: 44 ng/mL (ref 30–89)

## 2013-09-21 LAB — URIC ACID: Uric Acid, Serum: 3.6 mg/dL (ref 2.4–7.0)

## 2013-09-23 ENCOUNTER — Encounter: Payer: Self-pay | Admitting: Family

## 2013-09-23 NOTE — Telephone Encounter (Signed)
Opened in error

## 2013-09-25 ENCOUNTER — Other Ambulatory Visit: Payer: Self-pay | Admitting: Pulmonary Disease

## 2013-10-18 ENCOUNTER — Encounter (HOSPITAL_BASED_OUTPATIENT_CLINIC_OR_DEPARTMENT_OTHER): Payer: Commercial Managed Care - HMO

## 2013-10-21 ENCOUNTER — Telehealth: Payer: Self-pay | Admitting: *Deleted

## 2013-10-21 MED ORDER — FLUTICASONE PROPIONATE 50 MCG/ACT NA SUSP: 2.0000 | Freq: Every day | NASAL | Status: DC

## 2013-10-21 MED ORDER — OMEPRAZOLE 20 MG PO CPDR: DELAYED_RELEASE_CAPSULE | ORAL | Status: DC

## 2013-10-21 MED ORDER — METOPROLOL SUCCINATE ER 100 MG PO TB24: ORAL_TABLET | ORAL | Status: DC

## 2013-10-21 MED ORDER — AMLODIPINE BESYLATE 10 MG PO TABS
ORAL_TABLET | ORAL | Status: DC
Start: 2013-10-21 — End: 2024-01-22

## 2013-10-21 NOTE — Telephone Encounter (Signed)
Received note from pt requesting refills of:  Fluticasone, metoprolol, omeprazole and amlodipine to [facility]. Pt will be due for follow up on or after 12/21/13. Refills sent.

## 2013-12-05 ENCOUNTER — Telehealth: Payer: Self-pay | Admitting: *Deleted

## 2013-12-05 NOTE — Telephone Encounter (Signed)
Received fax from Dr. Estanislado Pandy requesting lab results for patient from July 2015. Results faxed to [phone]. JG//CMA

## 2013-12-12 ENCOUNTER — Encounter (HOSPITAL_BASED_OUTPATIENT_CLINIC_OR_DEPARTMENT_OTHER): Payer: Commercial Managed Care - HMO

## 2013-12-16 ENCOUNTER — Telehealth: Payer: Self-pay | Admitting: Family

## 2013-12-16 NOTE — Telephone Encounter (Signed)
Please let patient know that I received a disability claim request.  What diagnosis is she seeking disability for?  I will need to see her back in the office to address this disability form please.

## 2013-12-16 NOTE — Telephone Encounter (Signed)
Spoke with pt. She states she is on disability for severe depression, fibromyalgia, etc.  Per pt, Dr Lenna Gilford had been completing form in the past. Scheduled pt appt to complete disability form on 12/25/13 at 10:30am.

## 2013-12-18 ENCOUNTER — Telehealth: Payer: Self-pay | Admitting: Family

## 2013-12-18 ENCOUNTER — Ambulatory Visit (INDEPENDENT_AMBULATORY_CARE_PROVIDER_SITE_OTHER): Payer: Commercial Managed Care - HMO | Admitting: Family

## 2013-12-18 ENCOUNTER — Encounter: Payer: Self-pay | Admitting: Family

## 2013-12-18 VITALS — BP 159/99 | HR 73 | Temp 98.4°F | Resp 18 | Ht 65.5 in | Wt 179.0 lb

## 2013-12-18 DIAGNOSIS — F329 Major depressive disorder, single episode, unspecified: Secondary | ICD-10-CM

## 2013-12-18 DIAGNOSIS — I10 Essential (primary) hypertension: Secondary | ICD-10-CM

## 2013-12-18 DIAGNOSIS — M797 Fibromyalgia: Secondary | ICD-10-CM

## 2013-12-18 DIAGNOSIS — Z23 Encounter for immunization: Secondary | ICD-10-CM

## 2013-12-18 DIAGNOSIS — F32A Depression, unspecified: Secondary | ICD-10-CM

## 2013-12-18 MED ORDER — HYDROCHLOROTHIAZIDE 25 MG PO TABS: 25.0000 mg | ORAL_TABLET | Freq: Every day | ORAL | Status: DC

## 2013-12-18 NOTE — Assessment & Plan Note (Signed)
Fair control, managed by psychiatry. Dr. Redgie Grayer.

## 2013-12-18 NOTE — Telephone Encounter (Signed)
Please contact pt and let her know that based on her BP reading today, I would recommend that she add hctz once daily and return for BP check (nurse visit) and bmet (dx htn) in 2 weeks please.

## 2013-12-18 NOTE — Progress Notes (Signed)
Subjective:    Patient ID: Tammy Mullins, female    DOB: Sep 26, 1950, 63 y.o.   MRN: [id number]  HPI  Tammy Mullins is a 63 yr old female who presents today to discuss her disability form.  She tells me that she used work for a company which [employer]. Worked for 33 years.  Reports that FM and gout, depression and degenerative disc disease were the primary diagnoses which kept her out of work.  She receives her disability through social security and has not worked since Jun 10, 2002 (job injury in her wrist initially) and was not able to go back to work after that.  She follows with Dr.Caffrey for knee pain.     Depression- she is followed by Redgie Grayer. She reports moderate depression. She rates her depression a 5/10 in severity  Fibromyalgia- she rates this an 8/10 in severity  Chronic low back pain- reports that this is an ongoing issue.   Review of Systems See HPI  Past Medical History  Diagnosis Date  . Allergic rhinitis, cause unspecified   . Unspecified essential hypertension   . Personal history of unspecified circulatory disease   . Pure hypercholesterolemia   . Esophageal reflux   . Barrett's esophagus   . Diverticulosis of colon (without mention of hemorrhage)   . Irritable bowel syndrome   . Osteoarthrosis, unspecified whether generalized or localized, unspecified site   . Gout, unspecified   . Myalgia and myositis, unspecified   . Radial styloid tenosynovitis   . Chronic pain syndrome   . Attention deficit disorder without mention of hyperactivity   . Unspecified nonpsychotic mental disorder   . Anxiety state, unspecified   . Unspecified pruritic disorder     History   Social History  . Marital Status: Single    Spouse Name: N/A    Number of Children: N/A  . Years of Education: N/A   Occupational History  . retired    Social History Main Topics  . Smoking status: Never Smoker   . Smokeless tobacco: Never Used  . Alcohol Use: No  . Drug  Use: Not on file  . Sexual Activity: No   Other Topics Concern  . 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            Past Surgical History  Procedure Laterality Date  . Rt  ankle    . Carpal tunnel release    . Rotator cuff repair    . C-spine    . Salpingoophorectomy  Left    ectopic 1980    Family History  Problem Relation Age of Onset  . Cirrhosis Father   . Cancer Maternal Grandmother     ?  . Lung disease Mother     ?   . Glaucoma Mother   . Glaucoma Brother   . Heart disease Sister   . Kidney disease Neg Hx   . Diabetes Neg Hx     Allergies  Allergen Reactions  . Crestor [Rosuvastatin Calcium]     Causes severe body aches    Current Outpatient Prescriptions on File Prior to Visit  Medication Sig Dispense Refill  .  allopurinol (ZYLOPRIM) 300 MG tablet Take 2 tabs  by mouth daily.      Marland Kitchen amLODipine (NORVASC) 10 MG tablet TAKE 1 TABLET DAILY  90 tablet  1  . aspirin 81 MG tablet Take 81 mg by mouth daily.        . Cholecalciferol (VITAMIN D) 2000 UNITS CAPS Take 1 capsule by mouth daily.      . cyclobenzaprine (FLEXERIL) 10 MG tablet TAKE 1 TABLET BY MOUTH EVERY NIGHT AT BEDTIME  30 tablet  0  . dicyclomine (BENTYL) 20 MG tablet Take 1 tablet (20 mg total) by mouth 3 (three) times daily as needed.  90 tablet  0  . DULoxetine (CYMBALTA) 60 MG capsule Take 60 mg by mouth 2 (two) times daily.        . fish oil-omega-3 fatty acids 1000 MG capsule Take 2 capsules daily      . fluticasone (FLONASE) 50 MCG/ACT nasal spray Place 2 sprays into both nostrils at bedtime.  48 g  1  . gabapentin (NEURONTIN) 300 MG capsule Take 300 mg by mouth 3 (three) times daily as needed.        Marland Kitchen  HYDROcodone-acetaminophen (VICODIN) 5-500 MG per tablet Take 1 tablet by mouth every 6 (six) hours as needed.        Marland Kitchen losartan (COZAAR) 100 MG tablet Take 1 tablet (100 mg total) by mouth daily.  30 tablet  5  . methocarbamol (ROBAXIN) 500 MG tablet Take 500 mg by mouth 3 (three) times daily as needed. Take as directed by Dr. Estanislado Pandy.       . metoprolol succinate (TOPROL-XL) 100 MG 24 hr tablet TAKE 1 TABLET DAILY  90 tablet  1  . omeprazole (PRILOSEC) 20 MG capsule TAKE 1 CAPSULE EVERY DAY  90 capsule  1  . QUEtiapine (SEROQUEL) 300 MG tablet Take 600 mg by mouth at bedtime.        . Vitamin D, Ergocalciferol, (DRISDOL) 50000 UNITS CAPS capsule Take 1 capsule (50,000 Units total) by mouth every 7 (seven) days.  12 capsule  0   No current facility-administered medications on file prior to visit.    BP 159/99  Pulse 73  Temp(Src) 98.4 F (36.9 C) (Oral)  Resp 18  Ht 5' 5.5" (1.664 m)  Wt 179 lb (81.194 kg)  BMI 29.32 kg/m2  SpO2 96%       Objective:   Physical Exam  Constitutional: She is oriented to person, place, and time. She appears well-developed and well-nourished. No distress.  Cardiovascular: Normal rate and regular rhythm.   No murmur heard. Pulmonary/Chest: Effort normal and breath sounds normal. No respiratory distress. She has no wheezes. She has no rales. She exhibits no tenderness.  Musculoskeletal:  Knuckle deformities noted bilateral hands  Neurological: She is alert and oriented to person, place, and time.  Psychiatric: She has a normal mood and affect. Her behavior is normal.          Assessment & Plan:  Disability form is filled today.

## 2013-12-18 NOTE — Patient Instructions (Signed)
Please schedule a follow up appointment in 3 months.

## 2013-12-18 NOTE — Assessment & Plan Note (Signed)
She follows with Dr. Estanislado Pandy for this. She is maintained on cymbalta for pain but pain persists.

## 2013-12-18 NOTE — Addendum Note (Signed)
Addended by: Debbrah Alar on: 12/18/2013 05:20 PM   Modules accepted: Level of Service

## 2013-12-18 NOTE — Assessment & Plan Note (Signed)
Uncontrolled, add hctz, follow up in 2 weeks (see phone note).

## 2013-12-25 ENCOUNTER — Ambulatory Visit: Payer: Commercial Managed Care - HMO | Admitting: Family

## 2013-12-27 ENCOUNTER — Telehealth: Payer: Self-pay | Admitting: Family

## 2013-12-27 NOTE — Telephone Encounter (Signed)
Caller name:Monday, [name] Relation to pt: self  Call back number: [phone]   Reason for call:   Pt checking status if [facility] forms were faxed. Pt stated you filled out last OV, [employer] stated they have not received paperwork. Please advise

## 2013-12-27 NOTE — Telephone Encounter (Signed)
Form refaxed, notified pt.

## 2013-12-30 ENCOUNTER — Encounter: Payer: Self-pay | Admitting: Family

## 2014-01-02 ENCOUNTER — Other Ambulatory Visit (INDEPENDENT_AMBULATORY_CARE_PROVIDER_SITE_OTHER): Payer: Commercial Managed Care - HMO

## 2014-01-02 ENCOUNTER — Telehealth: Payer: Self-pay | Admitting: *Deleted

## 2014-01-02 DIAGNOSIS — I10 Essential (primary) hypertension: Secondary | ICD-10-CM

## 2014-01-02 NOTE — Telephone Encounter (Signed)
Pt was here for labs and B/P check  - 144/88.  no complaints or symptoms at this time.

## 2014-01-02 NOTE — Telephone Encounter (Signed)
Noted, bp is improved.

## 2014-01-03 ENCOUNTER — Telehealth: Payer: Self-pay | Admitting: *Deleted

## 2014-01-03 ENCOUNTER — Encounter: Payer: Self-pay | Admitting: Family

## 2014-01-03 DIAGNOSIS — R739 Hyperglycemia, unspecified: Secondary | ICD-10-CM

## 2014-01-03 LAB — BASIC METABOLIC PANEL
BUN: 10 mg/dL (ref 6–23)
CO2: 30 mEq/L (ref 19–32)
CREATININE: 0.9 mg/dL (ref 0.4–1.2)
Calcium: 9.8 mg/dL (ref 8.4–10.5)
Chloride: 100 mEq/L (ref 96–112)
GFR: 85.07 mL/min (ref >60.00)
Glucose, Bld: 158 mg/dL — ABNORMAL HIGH (ref 70–99)
Potassium: 3.5 mEq/L (ref 3.5–5.1)
Sodium: 138 mEq/L (ref 135–145)

## 2014-01-03 NOTE — Telephone Encounter (Signed)
Pt aware. Lab order entered and lab appt scheduled.

## 2014-01-03 NOTE — Telephone Encounter (Signed)
-----   Message from Debbrah Alar, NP sent at 01/03/2014  3:14 PM EST ----- Sugar is elevated. I would like to complete A1C dx hyperglycemia please.

## 2014-01-06 ENCOUNTER — Other Ambulatory Visit: Payer: Commercial Managed Care - HMO

## 2014-01-07 ENCOUNTER — Other Ambulatory Visit (INDEPENDENT_AMBULATORY_CARE_PROVIDER_SITE_OTHER): Payer: Commercial Managed Care - HMO

## 2014-01-07 DIAGNOSIS — R739 Hyperglycemia, unspecified: Secondary | ICD-10-CM

## 2014-01-07 LAB — HEMOGLOBIN A1C: Hgb A1c MFr Bld: 6.5 % (ref 4.6–6.5)

## 2014-01-09 ENCOUNTER — Telehealth: Payer: Self-pay | Admitting: Family

## 2014-01-09 ENCOUNTER — Encounter: Payer: Self-pay | Admitting: Family

## 2014-01-09 DIAGNOSIS — E119 Type 2 diabetes mellitus without complications: Secondary | ICD-10-CM | POA: Insufficient documentation

## 2014-01-09 HISTORY — DX: Type 2 diabetes mellitus without complications: E11.9

## 2014-01-09 NOTE — Telephone Encounter (Signed)
Left message for pt to return my call.

## 2014-01-09 NOTE — Telephone Encounter (Addendum)
Lab work confirms diabetes.  It is at a level currently where we focus on diabetic diet (avoiding concentrated sweets, limiting portions of carbs, more veggies, lean proteins), regular exercise, weight loss.  Lets plan to bring her back in 3 months.

## 2014-01-10 NOTE — Telephone Encounter (Signed)
Notified pt and she voices understanding. Scheduled 3 month f/u for 04/09/14 at 8am.

## 2014-01-13 ENCOUNTER — Other Ambulatory Visit: Payer: Self-pay | Admitting: Gynecology

## 2014-01-13 DIAGNOSIS — Z1231 Encounter for screening mammogram for malignant neoplasm of breast: Secondary | ICD-10-CM

## 2014-02-17 ENCOUNTER — Ambulatory Visit (HOSPITAL_COMMUNITY): Payer: Commercial Managed Care - HMO

## 2014-03-04 ENCOUNTER — Encounter (HOSPITAL_BASED_OUTPATIENT_CLINIC_OR_DEPARTMENT_OTHER): Payer: Commercial Managed Care - HMO

## 2014-03-21 ENCOUNTER — Ambulatory Visit: Payer: Commercial Managed Care - HMO | Admitting: Family

## 2014-04-08 ENCOUNTER — Ambulatory Visit: Payer: Commercial Managed Care - HMO | Admitting: Family

## 2014-04-09 ENCOUNTER — Ambulatory Visit: Payer: Commercial Managed Care - HMO | Admitting: Family

## 2014-05-14 ENCOUNTER — Telehealth: Payer: Self-pay | Admitting: Family

## 2014-05-14 DIAGNOSIS — G894 Chronic pain syndrome: Secondary | ICD-10-CM

## 2014-05-14 NOTE — Telephone Encounter (Signed)
Could you please request most recent office note from them?  Thanks.

## 2014-05-14 NOTE — Telephone Encounter (Signed)
Attempted to call Orthopedics at # below and received fast busy tone. Also tried [phone] and received fast busy tone. Will try again later.

## 2014-05-14 NOTE — Telephone Encounter (Signed)
Still unable to get through at [facility]. Will keep trying.

## 2014-05-14 NOTE — Telephone Encounter (Signed)
Caller name:[facility] Relation to OI:[facility] Call back number:[phone] Pharmacy:  Reason for call: pt is needing a referral to pain management, Dr. Hardin Negus will not take her, [facility] will not accept her, nor will [facility],all  for different reasons. Ivin Booty wanted to make you aware of that before finding a referral.  Pt [employer] as her insurance and the referral has to come from her PCP.

## 2014-05-20 NOTE — Telephone Encounter (Signed)
Requested most recent office note from [facility]. She states pt's last note is 12/04/13 and copy is scanned into EPIC.  Please advise.

## 2014-05-22 ENCOUNTER — Other Ambulatory Visit: Payer: Self-pay | Admitting: Gynecology

## 2014-05-22 DIAGNOSIS — Z1231 Encounter for screening mammogram for malignant neoplasm of breast: Secondary | ICD-10-CM

## 2014-06-16 ENCOUNTER — Ambulatory Visit (HOSPITAL_COMMUNITY)
Admission: RE | Admit: 2014-06-16 | Discharge: 2014-06-16 | Disposition: A | Discharge status: Home / Self Care | Payer: PPO | Source: Ambulatory Visit | Attending: Gynecology | Admitting: Gynecology

## 2014-06-16 DIAGNOSIS — Z1231 Encounter for screening mammogram for malignant neoplasm of breast: Secondary | ICD-10-CM | POA: Insufficient documentation

## 2014-09-19 ENCOUNTER — Encounter: Payer: Self-pay | Admitting: Internal Medicine

## 2015-03-04 DIAGNOSIS — H52223 Regular astigmatism, bilateral: Secondary | ICD-10-CM | POA: Diagnosis not present

## 2015-03-04 DIAGNOSIS — H5203 Hypermetropia, bilateral: Secondary | ICD-10-CM | POA: Diagnosis not present

## 2015-03-04 DIAGNOSIS — H04123 Dry eye syndrome of bilateral lacrimal glands: Secondary | ICD-10-CM | POA: Diagnosis not present

## 2015-03-04 DIAGNOSIS — H2513 Age-related nuclear cataract, bilateral: Secondary | ICD-10-CM | POA: Diagnosis not present

## 2015-03-04 DIAGNOSIS — H524 Presbyopia: Secondary | ICD-10-CM | POA: Diagnosis not present

## 2015-03-10 ENCOUNTER — Other Ambulatory Visit: Payer: Self-pay | Admitting: Family Medicine

## 2015-03-10 ENCOUNTER — Encounter: Payer: Self-pay | Admitting: Gynecology

## 2015-03-10 DIAGNOSIS — R1011 Right upper quadrant pain: Secondary | ICD-10-CM

## 2015-03-11 ENCOUNTER — Ambulatory Visit
Admission: RE | Admit: 2015-03-11 | Discharge: 2015-03-11 | Disposition: A | Discharge status: Home / Self Care | Payer: PPO | Source: Ambulatory Visit | Attending: Family Medicine | Admitting: Family Medicine

## 2015-03-11 DIAGNOSIS — R1011 Right upper quadrant pain: Secondary | ICD-10-CM

## 2015-03-11 DIAGNOSIS — K8020 Calculus of gallbladder without cholecystitis without obstruction: Secondary | ICD-10-CM | POA: Diagnosis not present

## 2015-03-24 DIAGNOSIS — M62830 Muscle spasm of back: Secondary | ICD-10-CM | POA: Diagnosis not present

## 2015-04-28 DIAGNOSIS — G4709 Other insomnia: Secondary | ICD-10-CM | POA: Diagnosis not present

## 2015-04-28 DIAGNOSIS — R5381 Other malaise: Secondary | ICD-10-CM | POA: Diagnosis not present

## 2015-04-28 DIAGNOSIS — M797 Fibromyalgia: Secondary | ICD-10-CM | POA: Diagnosis not present

## 2015-04-28 DIAGNOSIS — M545 Low back pain: Secondary | ICD-10-CM | POA: Diagnosis not present

## 2015-05-01 ENCOUNTER — Other Ambulatory Visit (HOSPITAL_COMMUNITY): Payer: Self-pay | Admitting: Rheumatology

## 2015-05-01 DIAGNOSIS — R1031 Right lower quadrant pain: Secondary | ICD-10-CM

## 2015-05-07 ENCOUNTER — Ambulatory Visit (HOSPITAL_COMMUNITY): Payer: PPO

## 2015-05-08 ENCOUNTER — Ambulatory Visit (HOSPITAL_COMMUNITY): Payer: PPO

## 2015-05-12 ENCOUNTER — Encounter (HOSPITAL_COMMUNITY): Payer: Self-pay

## 2015-05-12 ENCOUNTER — Ambulatory Visit (HOSPITAL_COMMUNITY)
Admission: RE | Admit: 2015-05-12 | Discharge: 2015-05-12 | Disposition: A | Discharge status: Home / Self Care | Payer: PPO | Source: Ambulatory Visit | Attending: Rheumatology | Admitting: Rheumatology

## 2015-05-12 DIAGNOSIS — K760 Fatty (change of) liver, not elsewhere classified: Secondary | ICD-10-CM | POA: Diagnosis not present

## 2015-05-12 DIAGNOSIS — R1031 Right lower quadrant pain: Secondary | ICD-10-CM | POA: Insufficient documentation

## 2015-05-12 DIAGNOSIS — K5730 Diverticulosis of large intestine without perforation or abscess without bleeding: Secondary | ICD-10-CM | POA: Diagnosis not present

## 2015-05-12 DIAGNOSIS — R938 Abnormal findings on diagnostic imaging of other specified body structures: Secondary | ICD-10-CM | POA: Diagnosis not present

## 2015-05-12 LAB — POCT I-STAT CREATININE: CREATININE: 0.8 mg/dL (ref 0.44–1.00)

## 2015-05-12 MED ORDER — IOHEXOL 300 MG/ML  SOLN
100.0000 mL | Freq: Once | INTRAMUSCULAR | Status: AC | PRN
  Administered 2015-05-12: 100 mL via INTRAVENOUS

## 2015-05-14 ENCOUNTER — Other Ambulatory Visit (HOSPITAL_COMMUNITY): Payer: Self-pay | Admitting: General Surgery

## 2015-05-14 ENCOUNTER — Encounter (HOSPITAL_COMMUNITY): Payer: Self-pay | Admitting: Nurse Practitioner

## 2015-05-14 ENCOUNTER — Emergency Department (HOSPITAL_COMMUNITY)
Admission: EM | Admit: 2015-05-14 | Discharge: 2015-05-14 | Disposition: A | Discharge status: Home / Self Care | Payer: PPO | Attending: Emergency Medicine | Admitting: Emergency Medicine

## 2015-05-14 DIAGNOSIS — R1032 Left lower quadrant pain: Secondary | ICD-10-CM | POA: Diagnosis not present

## 2015-05-14 DIAGNOSIS — F419 Anxiety disorder, unspecified: Secondary | ICD-10-CM | POA: Insufficient documentation

## 2015-05-14 DIAGNOSIS — R109 Unspecified abdominal pain: Secondary | ICD-10-CM

## 2015-05-14 DIAGNOSIS — Z8709 Personal history of other diseases of the respiratory system: Secondary | ICD-10-CM | POA: Insufficient documentation

## 2015-05-14 DIAGNOSIS — G894 Chronic pain syndrome: Secondary | ICD-10-CM | POA: Insufficient documentation

## 2015-05-14 DIAGNOSIS — R1031 Right lower quadrant pain: Secondary | ICD-10-CM | POA: Diagnosis not present

## 2015-05-14 DIAGNOSIS — R1110 Vomiting, unspecified: Secondary | ICD-10-CM | POA: Insufficient documentation

## 2015-05-14 DIAGNOSIS — Z8739 Personal history of other diseases of the musculoskeletal system and connective tissue: Secondary | ICD-10-CM | POA: Insufficient documentation

## 2015-05-14 DIAGNOSIS — K2270 Barrett's esophagus without dysplasia: Secondary | ICD-10-CM | POA: Diagnosis not present

## 2015-05-14 DIAGNOSIS — K219 Gastro-esophageal reflux disease without esophagitis: Secondary | ICD-10-CM | POA: Diagnosis not present

## 2015-05-14 DIAGNOSIS — E119 Type 2 diabetes mellitus without complications: Secondary | ICD-10-CM | POA: Diagnosis not present

## 2015-05-14 DIAGNOSIS — Z872 Personal history of diseases of the skin and subcutaneous tissue: Secondary | ICD-10-CM | POA: Diagnosis not present

## 2015-05-14 DIAGNOSIS — M109 Gout, unspecified: Secondary | ICD-10-CM | POA: Diagnosis not present

## 2015-05-14 DIAGNOSIS — E669 Obesity, unspecified: Secondary | ICD-10-CM | POA: Insufficient documentation

## 2015-05-14 DIAGNOSIS — K36 Other appendicitis: Secondary | ICD-10-CM

## 2015-05-14 DIAGNOSIS — I10 Essential (primary) hypertension: Secondary | ICD-10-CM | POA: Insufficient documentation

## 2015-05-14 DIAGNOSIS — K8020 Calculus of gallbladder without cholecystitis without obstruction: Secondary | ICD-10-CM | POA: Diagnosis not present

## 2015-05-14 LAB — COMPREHENSIVE METABOLIC PANEL
ALBUMIN: 4.1 g/dL (ref 3.5–5.0)
ALT: 21 U/L (ref 14–54)
AST: 24 U/L (ref 15–41)
Alkaline Phosphatase: 55 U/L (ref 38–126)
Anion gap: 11 (ref 5–15)
BUN: 13 mg/dL (ref 6–20)
CHLORIDE: 102 mmol/L (ref 101–111)
CO2: 26 mmol/L (ref 22–32)
Calcium: 9.6 mg/dL (ref 8.9–10.3)
Creatinine, Ser: 0.93 mg/dL (ref 0.44–1.00)
GFR calc Af Amer: >60 mL/min (ref >60)
Glucose, Bld: 121 mg/dL — ABNORMAL HIGH (ref 65–99)
POTASSIUM: 4.3 mmol/L (ref 3.5–5.1)
Sodium: 139 mmol/L (ref 135–145)
Total Bilirubin: 0.5 mg/dL (ref 0.3–1.2)
Total Protein: 7.1 g/dL (ref 6.5–8.1)

## 2015-05-14 LAB — URINALYSIS, ROUTINE W REFLEX MICROSCOPIC
Glucose, UA: NEGATIVE mg/dL
Hgb urine dipstick: NEGATIVE
KETONES UR: NEGATIVE mg/dL
NITRITE: NEGATIVE
PH: 5.5 (ref 5.0–8.0)
Protein, ur: 30 mg/dL — AB

## 2015-05-14 LAB — CBC
HEMATOCRIT: 36.9 % (ref 36.0–46.0)
Hemoglobin: 12.1 g/dL (ref 12.0–15.0)
MCH: 30.9 pg (ref 26.0–34.0)
MCHC: 32.8 g/dL (ref 30.0–36.0)
MCV: 94.4 fL (ref 78.0–100.0)
Platelets: 280 10*3/uL (ref 150–400)
RBC: 3.91 MIL/uL (ref 3.87–5.11)
RDW: 14 % (ref 11.5–15.5)
WBC: 7.3 10*3/uL (ref 4.0–10.5)

## 2015-05-14 LAB — URINE MICROSCOPIC-ADD ON: RBC / HPF: NONE SEEN RBC/hpf (ref 0–5)

## 2015-05-14 NOTE — Discharge Instructions (Signed)
The general surgery office will call you to let you know when you're HIDA scan is scheduled and when your follow-up appointment with surgery is. Take Tylenol for pain

## 2015-05-14 NOTE — H&P (Signed)
Chief Complaint: vomiting and back pain HPI: Tammy Mullins is a 65 year old female with a history of HTN, DM II, chronic back pain, obesity, gallstones who presents with vomiting for one month.  The patient was seen by Dr. Rosendo Gros for known gallstones.  Her symptoms were felt to be atypical as she only complained of back pain and the patient was referred back to Dr. Estanislado Pandy.  She reports over the past 1 month she has experienced vomiting.  No aggravating factors such as food.  No alleviating factors.  Denies ever having abdominal pain.  She continues to have back pain.  CT scan of abdomen and pelvis ordered by Dr. Estanislado Pandy on 3/14 demonstrated a dilated appendix without stranding.  After further review, there is air and contrast in the appendix which also excludes appendicitis.  Certainly cannot rule out a mucocele.  Last colonoscopy was by Dr. Carlean Purl in 2009 which was normal.   On today's labs, no leukocytosis, LFTs are normal.    We have been asked to exclude acute appendicitis   Past Medical History  Diagnosis Date  . Allergic rhinitis, cause unspecified   . Unspecified essential hypertension   . Personal history of unspecified circulatory disease   . Pure hypercholesterolemia   . Esophageal reflux   . Barrett's esophagus   . Diverticulosis of colon (without mention of hemorrhage)   . Irritable bowel syndrome   . Osteoarthrosis, unspecified whether generalized or localized, unspecified site   . Gout, unspecified   . Myalgia and myositis, unspecified   . Radial styloid tenosynovitis   . Chronic pain syndrome   . Attention deficit disorder without mention of hyperactivity   . Unspecified nonpsychotic mental disorder   . Anxiety state, unspecified   . Unspecified pruritic disorder   . Diabetes type 2, controlled ([place]) 01/09/2014    Past Surgical History  Procedure Laterality Date  . Rt  ankle    . Carpal tunnel release    . Rotator cuff repair    . C-spine    .  Salpingoophorectomy  Left    ectopic 1980    Family History  Problem Relation Age of Onset  . Cirrhosis Father   . Cancer Maternal Grandmother     ?  . Lung disease Mother     ?   . Glaucoma Mother   . Glaucoma Brother   . Heart disease Sister   . Kidney disease Neg Hx   . Diabetes Neg Hx    Social History:  reports that she has never smoked. She has never used smokeless tobacco. She reports that she does not drink alcohol. Her drug history is not on file.  Allergies:  Allergies  Allergen Reactions  . Crestor [Rosuvastatin Calcium]     Causes severe body aches     (Not in a hospital admission)  Results for orders placed or performed during the hospital encounter of 05/14/15 (from the past 48 hour(s))  Comprehensive metabolic panel     Status: Abnormal   Collection Time: 05/14/15 12:08 PM  Result Value Ref Range   Sodium 139 135 - 145 mmol/L   Potassium 4.3 3.5 - 5.1 mmol/L   Chloride 102 101 - 111 mmol/L   CO2 26 22 - 32 mmol/L   Glucose, Bld 121 (H) 65 - 99 mg/dL   BUN 13 6 - 20 mg/dL   Creatinine, Ser 0.93 0.44 - 1.00 mg/dL   Calcium 9.6 8.9 - 10.3 mg/dL   Total Protein 7.1 6.5 -  8.1 g/dL   Albumin 4.1 3.5 - 5.0 g/dL   AST 24 15 - 41 U/L   ALT 21 14 - 54 U/L   Alkaline Phosphatase 55 38 - 126 U/L   Total Bilirubin 0.5 0.3 - 1.2 mg/dL   GFR calc non Af Amer >60 >60 mL/min   GFR calc Af Amer >60 >60 mL/min    Comment: (NOTE) The eGFR has been calculated using the CKD EPI equation. This calculation has not been validated in all clinical situations. eGFR's persistently <60 mL/min signify possible Chronic Kidney Disease.    Anion gap 11 5 - 15  CBC     Status: None   Collection Time: 05/14/15 12:08 PM  Result Value Ref Range   WBC 7.3 4.0 - 10.5 K/uL   RBC 3.91 3.87 - 5.11 MIL/uL   Hemoglobin 12.1 12.0 - 15.0 g/dL   HCT 36.9 36.0 - 46.0 %   MCV 94.4 78.0 - 100.0 fL   MCH 30.9 26.0 - 34.0 pg   MCHC 32.8 30.0 - 36.0 g/dL   RDW 14.0 11.5 - 15.5 %    Platelets 280 150 - 400 K/uL  Urinalysis, Routine w reflex microscopic (not at [facility])     Status: Abnormal   Collection Time: 05/14/15 12:13 PM  Result Value Ref Range   Color, Urine YELLOW YELLOW   APPearance CLOUDY (A) CLEAR   Specific Gravity, Urine >1.030 (H) 1.005 - 1.030   pH 5.5 5.0 - 8.0   Glucose, UA NEGATIVE NEGATIVE mg/dL   Hgb urine dipstick NEGATIVE NEGATIVE   Bilirubin Urine SMALL (A) NEGATIVE   Ketones, ur NEGATIVE NEGATIVE mg/dL   Protein, ur 30 (A) NEGATIVE mg/dL   Nitrite NEGATIVE NEGATIVE   Leukocytes, UA SMALL (A) NEGATIVE  Urine microscopic-add on     Status: Abnormal   Collection Time: 05/14/15 12:13 PM  Result Value Ref Range   Squamous Epithelial / LPF 6-30 (A) NONE SEEN   WBC, UA 6-30 0 - 5 WBC/hpf   RBC / HPF NONE SEEN 0 - 5 RBC/hpf   Bacteria, UA MANY (A) NONE SEEN   Casts HYALINE CASTS (A) NEGATIVE   Urine-Other MUCOUS PRESENT     Comment: LESS THAN 10 mL OF URINE SUBMITTED   Ct Abdomen Pelvis W Contrast  05/12/2015  CLINICAL DATA:  Right lower quadrant pain and swelling for 1 month. Nausea. EXAM: CT ABDOMEN AND PELVIS WITH CONTRAST TECHNIQUE: Multidetector CT imaging of the abdomen and pelvis was performed using the standard protocol following bolus administration of intravenous contrast. CONTRAST:  188m OMNIPAQUE IOHEXOL 300 MG/ML  SOLN COMPARISON:  None. FINDINGS: Lower chest: Small hiatal hernia. Lung bases are clear. No pleural effusions. Heart is normal size. Hepatobiliary: Diffuse fatty infiltration of the liver. Gallbladder unremarkable. Pancreas: Normal Spleen: Normal Adrenals/Urinary Tract: No adrenal abnormality. No focal renal abnormality. No stones or hydronephrosis. Urinary bladder is unremarkable. Stomach/Bowel: Scattered sigmoid diverticula. No active diverticulitis. Stomach and small bowel are decompressed and unremarkable. The appendix is dilated in its mid and distal portions, measuring up to 13 mm. Contrast and gas are present within the  proximal appendix. Vascular/Lymphatic: Calcifications in the aorta and iliac vessels. No aneurysm. Circumaortic left renal vein. No adenopathy. Reproductive: No adnexal mass. Calcifications in the uterus which could be vascular or related to small fibroids. Other: No free fluid or free air. Musculoskeletal: No acute bony abnormality. IMPRESSION: The mid to distal appendix is dilated up to 13 mm which is abnormal. There is no  inflammatory stranding around the appendix. However, cannot exclude appendicitis or obstructing process within the appendix suggest mucocele. The proximal appendix is normal. Sigmoid diverticulosis. Fatty liver. Electronically Signed   By: Rolm Baptise M.D.   On: 05/12/2015 16:07    Review of Systems  Constitutional: Negative for fever, chills, weight loss, malaise/fatigue and diaphoresis.  Eyes: Negative for blurred vision, double vision, photophobia, pain, discharge and redness.  Respiratory: Negative for cough, hemoptysis, sputum production, shortness of breath and wheezing.   Cardiovascular: Negative for chest pain, palpitations, orthopnea, claudication and PND.  Gastrointestinal: Positive for nausea. Negative for heartburn, abdominal pain, diarrhea, constipation, blood in stool and melena.  Genitourinary: Negative for dysuria, urgency, frequency, hematuria and flank pain.  Musculoskeletal: Positive for back pain and joint pain. Negative for myalgias and neck pain.  Neurological: Negative for dizziness, tingling, tremors, sensory change, speech change, focal weakness, seizures, loss of consciousness and weakness.  Psychiatric/Behavioral: Negative for suicidal ideas and substance abuse.    Blood pressure 133/93, pulse 91, temperature 98.2 F (36.8 C), temperature source Oral, resp. rate 20, SpO2 94 %. Physical Exam  Constitutional: She is oriented to person, place, and time. She appears well-developed and well-nourished. No distress.  Cardiovascular: Normal rate, regular  rhythm, normal heart sounds and intact distal pulses.  Exam reveals no gallop and no friction rub.   No murmur heard. Respiratory: Effort normal. No respiratory distress. She has no wheezes. She has no rales. She exhibits no tenderness.  GI: Soft. Bowel sounds are normal. She exhibits no distension and no mass. There is no tenderness. There is no rebound and no guarding.  Neurological: She is alert and oriented to person, place, and time.  Skin: Skin is warm and dry. No rash noted. She is not diaphoretic. No erythema. No pallor.  Psychiatric: She has a normal mood and affect. Her behavior is normal. Judgment and thought content normal.     Assessment/Plan Vomiting Back pain Gallstones(US 03/11/15)  CT scan shows contrast and air in the appendix and there is no surrounding stranding.  White count is normal.  Non tender.  We can safely rule out appendicitis.  The appendix is enlarged and could potentially have a mucocele.  An appendectomy may be recommended, however, non urgent.    It's unclear why is is vomiting.  This is vague, intermittent and lacks other symptoms such as post prandial pain or pain period.  Nevertheless, she does have gallstones and symptoms could be related biliary colic and or dyskinesia.  Recommend outpatient HIDA scan with EF and a follow up with Dr. Rosendo Gros.  Our office will arrange the imaging.  Back pain is not related to gallbladder or the enlarged appendix.  Per ortho/PCP  Discharge per EDP, stable from a surgical standpoint.   Erby Pian, NP   05/14/2015, 2:48 PM

## 2015-05-14 NOTE — ED Provider Notes (Signed)
CSN: [id number]     Arrival date & time 05/14/15  1154 History   First MD Initiated Contact with Patient 05/14/15 1323     Chief Complaint  Patient presents with  . Abdominal Pain     (Consider location/radiation/quality/duration/timing/severity/associated sxs/prior Treatment) Patient is a 65 y.o. female presenting with abdominal pain. The history is provided by the patient (Patient complains of abdominal pain. She had a CAT scan 2 days ago which could not rule out appendicitis).  Abdominal Pain Pain location:  RLQ and LLQ Pain quality: aching   Pain radiates to:  Does not radiate Pain severity:  Mild Onset quality:  Gradual Timing:  Constant Progression:  Unchanged Chronicity:  New Context: not alcohol use   Associated symptoms: no chest pain, no cough, no diarrhea, no fatigue and no hematuria     Past Medical History  Diagnosis Date  . Allergic rhinitis, cause unspecified   . Unspecified essential hypertension   . Personal history of unspecified circulatory disease   . Pure hypercholesterolemia   . Esophageal reflux   . Barrett's esophagus   . Diverticulosis of colon (without mention of hemorrhage)   . Irritable bowel syndrome   . Osteoarthrosis, unspecified whether generalized or localized, unspecified site   . Gout, unspecified   . Myalgia and myositis, unspecified   . Radial styloid tenosynovitis   . Chronic pain syndrome   . Attention deficit disorder without mention of hyperactivity   . Unspecified nonpsychotic mental disorder   . Anxiety state, unspecified   . Unspecified pruritic disorder   . Diabetes type 2, controlled ([place]) 01/09/2014   Past Surgical History  Procedure Laterality Date  . Rt  ankle    . Carpal tunnel release    . Rotator cuff repair    . C-spine    . Salpingoophorectomy  Left    ectopic 1980   Family History  Problem Relation Age of Onset  . Cirrhosis Father   . Cancer Maternal Grandmother     ?  . Lung disease Mother     ?   .  Glaucoma Mother   . Glaucoma Brother   . Heart disease Sister   . Kidney disease Neg Hx   . Diabetes Neg Hx    Social History  Substance Use Topics  . Smoking status: Never Smoker   . Smokeless tobacco: Never Used  . Alcohol Use: No   OB History    Gravida Para Term Preterm AB TAB SAB Ectopic Multiple Living   1 0        0     Review of Systems  Constitutional: Negative for appetite change and fatigue.  HENT: Negative for congestion, ear discharge and sinus pressure.   Eyes: Negative for discharge.  Respiratory: Negative for cough.   Cardiovascular: Negative for chest pain.  Gastrointestinal: Positive for abdominal pain. Negative for diarrhea.  Genitourinary: Negative for frequency and hematuria.  Musculoskeletal: Negative for back pain.  Skin: Negative for rash.  Neurological: Negative for seizures and headaches.  Psychiatric/Behavioral: Negative for hallucinations.      Allergies  Crestor  Home Medications   Prior to Admission medications   Medication Sig Start Date End Date Taking? Authorizing Provider  allopurinol (ZYLOPRIM) 300 MG tablet Take 450 mg by mouth daily.   Yes Historical Provider, MD  amLODipine (NORVASC) 10 MG tablet TAKE 1 TABLET DAILY 10/21/13  Yes Debbrah Alar, NP  aspirin 81 MG tablet Take 81 mg by mouth daily.  Yes Historical Provider, MD  Cholecalciferol (VITAMIN D) 2000 UNITS CAPS Take 1 capsule by mouth daily.   Yes Historical Provider, MD  Cyanocobalamin (VITAMIN B 12 PO) Take 100 mcg by mouth daily.   Yes Historical Provider, MD  cyclobenzaprine (FLEXERIL) 10 MG tablet TAKE 1 TABLET BY MOUTH EVERY NIGHT AT BEDTIME 05/20/13  Yes Noralee Space, MD  dicyclomine (BENTYL) 20 MG tablet Take 1 tablet (20 mg total) by mouth 3 (three) times daily as needed. 11/28/12  Yes Noralee Space, MD  DULoxetine (CYMBALTA) 60 MG capsule Take 60 mg by mouth 2 (two) times daily.     Yes Historical Provider, MD  fish oil-omega-3 fatty acids 1000 MG capsule  Take 2 capsules daily   Yes Historical Provider, MD  fluticasone (FLONASE) 50 MCG/ACT nasal spray Place 2 sprays into both nostrils at bedtime. 10/21/13  Yes Debbrah Alar, NP  gabapentin (NEURONTIN) 300 MG capsule Take 300 mg by mouth 2 (two) times daily.    Yes Historical Provider, MD  hydrochlorothiazide (HYDRODIURIL) 25 MG tablet Take 1 tablet (25 mg total) by mouth daily. 12/18/13  Yes Debbrah Alar, NP  HYDROcodone-acetaminophen (NORCO) 7.5-325 MG tablet Take 1 tablet by mouth every 6 (six) hours as needed for moderate pain.   Yes Historical Provider, MD  losartan (COZAAR) 100 MG tablet Take 1 tablet (100 mg total) by mouth daily. 05/08/12  Yes Noralee Space, MD  metoprolol succinate (TOPROL-XL) 100 MG 24 hr tablet TAKE 1 TABLET DAILY 10/21/13  Yes Debbrah Alar, NP  omeprazole (PRILOSEC) 20 MG capsule TAKE 1 CAPSULE EVERY DAY 10/21/13  Yes Debbrah Alar, NP  QUEtiapine (SEROQUEL) 300 MG tablet Take 600 mg by mouth at bedtime.     Yes Historical Provider, MD  Vitamin D, Ergocalciferol, (DRISDOL) 50000 UNITS CAPS capsule Take 1 capsule (50,000 Units total) by mouth every 7 (seven) days. 06/27/13  Yes Debbrah Alar, NP  allopurinol (ZYLOPRIM) 300 MG tablet Take 2 tabs  by mouth daily.    Historical Provider, MD  HYDROcodone-acetaminophen (VICODIN) 5-500 MG per tablet Take 1 tablet by mouth every 6 (six) hours as needed.      Historical Provider, MD  methocarbamol (ROBAXIN) 500 MG tablet Take 500 mg by mouth 3 (three) times daily as needed. Take as directed by Dr. Estanislado Pandy.     Historical Provider, MD   BP 133/93 mmHg  Pulse 91  Temp(Src) 98.2 F (36.8 C) (Oral)  Resp 20  SpO2 94% Physical Exam  Constitutional: She is oriented to person, place, and time. She appears well-developed.  HENT:  Head: Normocephalic.  Eyes: Conjunctivae and EOM are normal. No scleral icterus.  Neck: Neck supple. No thyromegaly present.  Cardiovascular: Normal rate and regular rhythm.  Exam  reveals no gallop and no friction rub.   No murmur heard. Pulmonary/Chest: No stridor. She has no wheezes. She has no rales. She exhibits no tenderness.  Abdominal: She exhibits no distension. There is tenderness. There is no rebound.  Mild tenderness left lower quadrant and right lower quadrant  Musculoskeletal: Normal range of motion. She exhibits no edema.  Lymphadenopathy:    She has no cervical adenopathy.  Neurological: She is oriented to person, place, and time. She exhibits normal muscle tone. Coordination normal.  Skin: No rash noted. No erythema.  Psychiatric: She has a normal mood and affect. Her behavior is normal.    ED Course  Procedures (including critical care time) Labs Review Labs Reviewed  COMPREHENSIVE METABOLIC PANEL - Abnormal; Notable  for the following:    Glucose, Bld 121 (*)    All other components within normal limits  URINALYSIS, ROUTINE W REFLEX MICROSCOPIC (NOT AT [facility]) - Abnormal; Notable for the following:    APPearance CLOUDY (*)    Specific Gravity, Urine >1.030 (*)    Bilirubin Urine SMALL (*)    Protein, ur 30 (*)    Leukocytes, UA SMALL (*)    All other components within normal limits  URINE MICROSCOPIC-ADD ON - Abnormal; Notable for the following:    Squamous Epithelial / LPF 6-30 (*)    Bacteria, UA MANY (*)    Casts HYALINE CASTS (*)    All other components within normal limits  URINE CULTURE  CBC    Imaging Review Ct Abdomen Pelvis W Contrast  05/12/2015  CLINICAL DATA:  Right lower quadrant pain and swelling for 1 month. Nausea. EXAM: CT ABDOMEN AND PELVIS WITH CONTRAST TECHNIQUE: Multidetector CT imaging of the abdomen and pelvis was performed using the standard protocol following bolus administration of intravenous contrast. CONTRAST:  125mL OMNIPAQUE IOHEXOL 300 MG/ML  SOLN COMPARISON:  None. FINDINGS: Lower chest: Small hiatal hernia. Lung bases are clear. No pleural effusions. Heart is normal size. Hepatobiliary: Diffuse fatty  infiltration of the liver. Gallbladder unremarkable. Pancreas: Normal Spleen: Normal Adrenals/Urinary Tract: No adrenal abnormality. No focal renal abnormality. No stones or hydronephrosis. Urinary bladder is unremarkable. Stomach/Bowel: Scattered sigmoid diverticula. No active diverticulitis. Stomach and small bowel are decompressed and unremarkable. The appendix is dilated in its mid and distal portions, measuring up to 13 mm. Contrast and gas are present within the proximal appendix. Vascular/Lymphatic: Calcifications in the aorta and iliac vessels. No aneurysm. Circumaortic left renal vein. No adenopathy. Reproductive: No adnexal mass. Calcifications in the uterus which could be vascular or related to small fibroids. Other: No free fluid or free air. Musculoskeletal: No acute bony abnormality. IMPRESSION: The mid to distal appendix is dilated up to 13 mm which is abnormal. There is no inflammatory stranding around the appendix. However, cannot exclude appendicitis or obstructing process within the appendix suggest mucocele. The proximal appendix is normal. Sigmoid diverticulosis. Fatty liver. Electronically Signed   By: Rolm Baptise M.D.   On: 05/12/2015 16:07   I have personally reviewed and evaluated these images and lab results as part of my medical decision-making.   EKG Interpretation None      MDM   Final diagnoses:  Abdominal pain in female    Labs unremarkable except urine shows many bacteria but many squamous cells. We will get a urine culture. Patient was seen by surgery. Surgery does not believe the patient has appendicitis. They will arrange a HIDA scan and will follow-up with patient    Milton Ferguson, MD 05/14/15 [id number]

## 2015-05-14 NOTE — ED Notes (Signed)
Pt reports several day history of RLQ pain, her PCP ordered a CT scan on Tuesday which showed possible appendicitis. She states her PCP attempted to get her an appt with surgeon yesterday but they could not so they told her to come to ED today for management. She states she has been taking pain medication with relief of pain. She is alert, in no acute distress now, states pain is better today

## 2015-05-16 LAB — URINE CULTURE: Special Requests: NORMAL

## 2015-05-25 ENCOUNTER — Ambulatory Visit (HOSPITAL_COMMUNITY)
Admission: RE | Admit: 2015-05-25 | Discharge: 2015-05-25 | Disposition: A | Discharge status: Home / Self Care | Payer: PPO | Source: Ambulatory Visit | Attending: General Surgery | Admitting: General Surgery

## 2015-05-25 DIAGNOSIS — K8020 Calculus of gallbladder without cholecystitis without obstruction: Secondary | ICD-10-CM | POA: Diagnosis not present

## 2015-05-25 DIAGNOSIS — R109 Unspecified abdominal pain: Secondary | ICD-10-CM | POA: Diagnosis not present

## 2015-05-25 DIAGNOSIS — R112 Nausea with vomiting, unspecified: Secondary | ICD-10-CM | POA: Diagnosis not present

## 2015-05-25 DIAGNOSIS — K36 Other appendicitis: Secondary | ICD-10-CM | POA: Diagnosis not present

## 2015-05-25 MED ORDER — TECHNETIUM TC 99M MEBROFENIN IV KIT
5.2000 | PACK | Freq: Once | INTRAVENOUS | Status: AC | PRN
  Administered 2015-05-25: 5 via INTRAVENOUS

## 2015-05-26 ENCOUNTER — Ambulatory Visit: Payer: Self-pay | Admitting: General Surgery

## 2015-05-26 DIAGNOSIS — K8020 Calculus of gallbladder without cholecystitis without obstruction: Secondary | ICD-10-CM | POA: Diagnosis not present

## 2015-05-26 DIAGNOSIS — M1000 Idiopathic gout, unspecified site: Secondary | ICD-10-CM | POA: Diagnosis not present

## 2015-05-26 DIAGNOSIS — Z79899 Other long term (current) drug therapy: Secondary | ICD-10-CM | POA: Diagnosis not present

## 2015-05-26 NOTE — H&P (Signed)
History of Present Illness Tammy Ok MD; 05/26/2015 10:16 AM) Patient words: discuss HIDA.  The patient is a 65 year old female who presents for evaluation of gall stones. The patient is a 65 year old female who previously was seen secondary to right upper quadrant pain. Patient underwent HIDA scan which revealed an ejection fraction of 18.2%. Patient also had a CT scan which revealed a dilated distal appendix.  Patient states she continues with epigastric pain, reflux.   Allergies (Ammie Eversole, LPN; 579FGE 579FGE AM) Crestor *ANTIHYPERLIPIDEMICS*  Medication History (Ammie Eversole, LPN; 579FGE 579FGE AM) Metoprolol Succinate ER (100MG  Tablet ER 24HR, Oral) Active. Omeprazole (20MG  Capsule DR, Oral) Active. Dicyclomine HCl (20MG  Tablet, Oral three times daily) Active. Allopurinol (300MG  Tablet, Oral) Active. Gabapentin (300MG  Capsule, Oral two times daily) Active. Hydrocodone-Acetaminophen (7.5-325MG  Tablet, Oral as needed) Active. Flexeril (10MG  Tablet, Oral) Active. QUEtiapine Fumarate (300MG  Tablet, Oral two times at bedtime) Active. Fish Oil Burp-Less (1200MG  Capsule, Oral) Active. Aspirin (81MG  Tablet, Oral) Active. Vitamin B12 (100MCG Tablet, Oral) Active. Flonase (50MCG/ACT Suspension, Nasal) Active. Losartan Potassium (100MG  Tablet, Oral) Active. HydroCHLOROthiazide (25MG  Tablet, Oral) Active. AmLODIPine Besylate (10MG  Tablet, Oral) Active. Evista (60MG  Tablet, Oral) Active. Lancets 30G Active. Cymbalta (30MG  Capsule DR Part, Oral) Active. Naproxen (500MG  Tablet, Oral) Active. Calcium (500MG  Tablet, Oral) Active. Medications Reconciled    Review of Systems Tammy Ok, MD; 05/26/2015 10:17 AM) General Present- Feeling well. Not Present- Fever. Respiratory Not Present- Cough and Difficulty Breathing. Cardiovascular Not Present- Chest Pain. Gastrointestinal Present- Abdominal Pain and Nausea. Musculoskeletal Not Present-  Myalgia. Neurological Not Present- Weakness.  Vitals (Ammie Eversole LPN; 579FGE 579FGE AM) 05/26/2015 9:55 AM Weight: 185.8 lb Height: 65in Body Surface Area: 1.92 m Body Mass Index: 30.92 kg/m  Temp.: 98.61F(Oral)  Pulse: 80 (Regular)  BP: 140/82 (Sitting, Left Arm, Standard)       Physical Exam Tammy Ok, MD; 05/26/2015 10:17 AM) General Mental Status-Alert. General Appearance-Consistent with stated age. Hydration-Well hydrated. Voice-Normal.  Head and Neck Head-normocephalic, atraumatic with no lesions or palpable masses.  Eye Eyeball - Bilateral-Extraocular movements intact. Sclera/Conjunctiva - Bilateral-No scleral icterus.  Chest and Lung Exam Chest and lung exam reveals -quiet, even and easy respiratory effort with no use of accessory muscles. Inspection Chest Wall - Normal. Back - normal.  Cardiovascular Cardiovascular examination reveals -normal heart sounds, regular rate and rhythm with no murmurs.  Abdomen Inspection Normal Exam - No Hernias. Palpation/Percussion Normal exam - Soft, Non Tender, No Rebound tenderness, No Rigidity (guarding) and No hepatosplenomegaly. Auscultation Normal exam - Bowel sounds normal.  Neurologic Neurologic evaluation reveals -alert and oriented x 3 with no impairment of recent or remote memory. Mental Status-Normal.  Musculoskeletal Normal Exam - Left-Upper Extremity Strength Normal and Lower Extremity Strength Normal. Normal Exam - Right-Upper Extremity Strength Normal, Lower Extremity Weakness.    Assessment & Plan Tammy Ok MD; 05/26/2015 10:16 AM) GALLSTONES (K80.20) Impression: 65 year old female with biliary dyskinesia. Patient also has dilated appendix which CT scan reveals possible mucocele.  1. Will proceed to the operative for laparoscopic cholecystectomy and lap appendectomy. 2. Risks and benefits were discussed with the patient to generally include,  but not limited to: infection, bleeding, possible need for post op ERCP, damage to the bile ducts, bile leak, and possible need for further surgery. Alternatives were offered and described. All questions were answered and the patient voiced understanding of the procedure and wishes to proceed at this point with a laparoscopic cholecystectomy

## 2015-05-27 DIAGNOSIS — Z5181 Encounter for therapeutic drug level monitoring: Secondary | ICD-10-CM | POA: Diagnosis not present

## 2015-06-04 DIAGNOSIS — K219 Gastro-esophageal reflux disease without esophagitis: Secondary | ICD-10-CM | POA: Diagnosis not present

## 2015-06-04 DIAGNOSIS — I10 Essential (primary) hypertension: Secondary | ICD-10-CM | POA: Diagnosis not present

## 2015-06-04 DIAGNOSIS — K589 Irritable bowel syndrome without diarrhea: Secondary | ICD-10-CM | POA: Diagnosis not present

## 2015-06-04 DIAGNOSIS — Z23 Encounter for immunization: Secondary | ICD-10-CM | POA: Diagnosis not present

## 2015-06-19 ENCOUNTER — Encounter (HOSPITAL_COMMUNITY)
Admission: RE | Admit: 2015-06-19 | Discharge: 2015-06-19 | Disposition: A | Discharge status: Home / Self Care | Payer: PPO | Source: Ambulatory Visit | Attending: General Surgery | Admitting: General Surgery

## 2015-06-19 DIAGNOSIS — K389 Disease of appendix, unspecified: Secondary | ICD-10-CM | POA: Diagnosis not present

## 2015-06-19 DIAGNOSIS — Z01812 Encounter for preprocedural laboratory examination: Secondary | ICD-10-CM | POA: Insufficient documentation

## 2015-06-19 DIAGNOSIS — Z01818 Encounter for other preprocedural examination: Secondary | ICD-10-CM | POA: Insufficient documentation

## 2015-06-19 DIAGNOSIS — K8020 Calculus of gallbladder without cholecystitis without obstruction: Secondary | ICD-10-CM | POA: Diagnosis not present

## 2015-06-19 LAB — BASIC METABOLIC PANEL
ANION GAP: 13 (ref 5–15)
BUN: 16 mg/dL (ref 6–20)
CALCIUM: 9.4 mg/dL (ref 8.9–10.3)
CO2: 24 mmol/L (ref 22–32)
Chloride: 99 mmol/L — ABNORMAL LOW (ref 101–111)
Creatinine, Ser: 0.98 mg/dL (ref 0.44–1.00)
GFR, EST NON AFRICAN AMERICAN: 59 mL/min — AB (ref >60)
Glucose, Bld: 118 mg/dL — ABNORMAL HIGH (ref 65–99)
Potassium: 3.8 mmol/L (ref 3.5–5.1)
SODIUM: 136 mmol/L (ref 135–145)

## 2015-06-19 LAB — CBC
HCT: 36.3 % (ref 36.0–46.0)
HEMOGLOBIN: 11.8 g/dL — AB (ref 12.0–15.0)
MCH: 29.9 pg (ref 26.0–34.0)
MCHC: 32.5 g/dL (ref 30.0–36.0)
MCV: 92.1 fL (ref 78.0–100.0)
Platelets: 294 10*3/uL (ref 150–400)
RBC: 3.94 MIL/uL (ref 3.87–5.11)
RDW: 13.9 % (ref 11.5–15.5)
WBC: 14.1 10*3/uL — AB (ref 4.0–10.5)

## 2015-06-19 MED ORDER — CHLORHEXIDINE GLUCONATE 4 % EX LIQD: 1.0000 "application " | Freq: Once | CUTANEOUS | Status: DC

## 2015-06-19 NOTE — Pre-Procedure Instructions (Signed)
    Tammy Mullins  06/19/2015      [facility] - [place], [place] [address] [place] [address] Phone: [phone] Fax: [phone]  [facility] [place], [place] [address] Phone: [phone] Fax: [phone]    Your procedure is scheduled on June 26, 2015.  Report to [facility] Admitting at 7:00 A.M.  Call this number if you have problems the morning of surgery:  [phone]   Remember:  Do not eat food or drink liquids after midnight.  Take these medicines the morning of surgery with A SIP OF WATER : allopurinol (ZYLOPRIM), amLODipine (NORVASC), DULoxetine (CYMBALTA), gabapentin  (NEURONTIN),metoprolol succinate (TOPROL-XL),  omeprazole (PRILOSEC) ; IF NEEDED: HYDROcodone-acetaminophen (NORCO/VICODIN),methocarbamol  (ROBAXIN)    STOP ASPIRIN, FISH OIL, HERBAL MEDICATIONS ONE WEEK PRIOR TO SURGERY   Do not wear jewelry, make-up or nail polish.  Do not wear lotions, powders, or perfumes.  You may wear deodorant.  Do not shave 48 hours prior to surgery.   Do not bring valuables to the hospital.  [facility] is not responsible for any belongings or valuables.  Contacts, dentures or bridgework may not be worn into surgery.  Leave your suitcase in the car.  After surgery it may be brought to your room.  For patients admitted to the hospital, discharge time will be determined by your treatment team.  Patients discharged the day of surgery will not be allowed to drive home.   Name and phone number of your driver:    Special instructions:  PREPARING FOR SURGERY  Please read over the following fact sheets that you were given. Pain Booklet, Coughing and Deep Breathing and Surgical Site Infection Prevention

## 2015-06-25 MED ORDER — CEFAZOLIN SODIUM-DEXTROSE 2-4 GM/100ML-% IV SOLN
2.0000 g | INTRAVENOUS | Status: AC
  Administered 2015-06-26: 2 g via INTRAVENOUS
  Filled 2015-06-25: qty 100

## 2015-06-26 ENCOUNTER — Ambulatory Visit (HOSPITAL_COMMUNITY): Payer: PPO | Admitting: Certified Registered"

## 2015-06-26 ENCOUNTER — Encounter (HOSPITAL_COMMUNITY): Payer: Self-pay | Admitting: General Practice

## 2015-06-26 ENCOUNTER — Ambulatory Visit (HOSPITAL_COMMUNITY)
Admission: RE | Admit: 2015-06-26 | Discharge: 2015-06-26 | Disposition: A | Discharge status: Home / Self Care | Payer: PPO | Source: Ambulatory Visit | Attending: General Surgery | Admitting: General Surgery

## 2015-06-26 ENCOUNTER — Encounter (HOSPITAL_COMMUNITY)
Admission: RE | Disposition: A | Discharge status: Home / Self Care | Payer: Self-pay | Source: Ambulatory Visit | Attending: General Surgery

## 2015-06-26 DIAGNOSIS — E119 Type 2 diabetes mellitus without complications: Secondary | ICD-10-CM | POA: Diagnosis not present

## 2015-06-26 DIAGNOSIS — K8020 Calculus of gallbladder without cholecystitis without obstruction: Secondary | ICD-10-CM | POA: Diagnosis not present

## 2015-06-26 DIAGNOSIS — K219 Gastro-esophageal reflux disease without esophagitis: Secondary | ICD-10-CM | POA: Diagnosis not present

## 2015-06-26 DIAGNOSIS — K828 Other specified diseases of gallbladder: Secondary | ICD-10-CM | POA: Diagnosis not present

## 2015-06-26 DIAGNOSIS — Z79899 Other long term (current) drug therapy: Secondary | ICD-10-CM | POA: Insufficient documentation

## 2015-06-26 DIAGNOSIS — K388 Other specified diseases of appendix: Secondary | ICD-10-CM | POA: Diagnosis not present

## 2015-06-26 DIAGNOSIS — D121 Benign neoplasm of appendix: Secondary | ICD-10-CM | POA: Diagnosis not present

## 2015-06-26 DIAGNOSIS — G4730 Sleep apnea, unspecified: Secondary | ICD-10-CM | POA: Insufficient documentation

## 2015-06-26 DIAGNOSIS — M797 Fibromyalgia: Secondary | ICD-10-CM | POA: Insufficient documentation

## 2015-06-26 DIAGNOSIS — D373 Neoplasm of uncertain behavior of appendix: Secondary | ICD-10-CM | POA: Diagnosis not present

## 2015-06-26 DIAGNOSIS — I10 Essential (primary) hypertension: Secondary | ICD-10-CM | POA: Insufficient documentation

## 2015-06-26 DIAGNOSIS — F329 Major depressive disorder, single episode, unspecified: Secondary | ICD-10-CM | POA: Insufficient documentation

## 2015-06-26 DIAGNOSIS — Z7982 Long term (current) use of aspirin: Secondary | ICD-10-CM | POA: Insufficient documentation

## 2015-06-26 DIAGNOSIS — Z791 Long term (current) use of non-steroidal anti-inflammatories (NSAID): Secondary | ICD-10-CM | POA: Insufficient documentation

## 2015-06-26 DIAGNOSIS — G8929 Other chronic pain: Secondary | ICD-10-CM | POA: Diagnosis not present

## 2015-06-26 DIAGNOSIS — Z7951 Long term (current) use of inhaled steroids: Secondary | ICD-10-CM | POA: Insufficient documentation

## 2015-06-26 DIAGNOSIS — K8010 Calculus of gallbladder with chronic cholecystitis without obstruction: Secondary | ICD-10-CM | POA: Diagnosis not present

## 2015-06-26 HISTORY — PX: LAPAROSCOPIC APPENDECTOMY: SHX408

## 2015-06-26 HISTORY — PX: CHOLECYSTECTOMY: SHX55

## 2015-06-26 LAB — GLUCOSE, CAPILLARY: GLUCOSE-CAPILLARY: 145 mg/dL — AB (ref 65–99)

## 2015-06-26 SURGERY — LAPAROSCOPIC CHOLECYSTECTOMY
Anesthesia: General | Site: Abdomen

## 2015-06-26 MED ORDER — BUPIVACAINE HCL 0.25 % IJ SOLN
INTRAMUSCULAR | Status: DC | PRN
  Administered 2015-06-26: 5 mL

## 2015-06-26 MED ORDER — ACETAMINOPHEN 325 MG PO TABS: 650.0000 mg | ORAL_TABLET | ORAL | Status: DC | PRN

## 2015-06-26 MED ORDER — FENTANYL CITRATE (PF) 100 MCG/2ML IJ SOLN
25.0000 ug | INTRAMUSCULAR | Status: DC | PRN
  Administered 2015-06-26: 25 ug via INTRAVENOUS

## 2015-06-26 MED ORDER — LIDOCAINE HCL (CARDIAC) 20 MG/ML IV SOLN
INTRAVENOUS | Status: DC | PRN
  Administered 2015-06-26: 100 mg via INTRAVENOUS

## 2015-06-26 MED ORDER — SUGAMMADEX SODIUM 200 MG/2ML IV SOLN
INTRAVENOUS | Status: AC
  Filled 2015-06-26: qty 2

## 2015-06-26 MED ORDER — OXYCODONE HCL 5 MG PO TABS
5.0000 mg | ORAL_TABLET | ORAL | Status: DC | PRN
  Administered 2015-06-26: 5 mg via ORAL

## 2015-06-26 MED ORDER — METRONIDAZOLE IN NACL 5-0.79 MG/ML-% IV SOLN
INTRAVENOUS | Status: DC | PRN
  Administered 2015-06-26: 500 mg via INTRAVENOUS

## 2015-06-26 MED ORDER — ACETAMINOPHEN 650 MG RE SUPP: 650.0000 mg | RECTAL | Status: DC | PRN

## 2015-06-26 MED ORDER — SODIUM CHLORIDE 0.9 % IV SOLN: 250.0000 mL | INTRAVENOUS | Status: DC | PRN

## 2015-06-26 MED ORDER — SUCCINYLCHOLINE CHLORIDE 200 MG/10ML IV SOSY
PREFILLED_SYRINGE | INTRAVENOUS | Status: AC
Start: 2015-06-26 — End: 2015-06-26
  Filled 2015-06-26: qty 20

## 2015-06-26 MED ORDER — METOCLOPRAMIDE HCL 5 MG/ML IJ SOLN: 10.0000 mg | Freq: Once | INTRAMUSCULAR | Status: DC | PRN

## 2015-06-26 MED ORDER — OXYCODONE-ACETAMINOPHEN 5-325 MG PO TABS: 1.0000 | ORAL_TABLET | ORAL | Status: DC | PRN

## 2015-06-26 MED ORDER — ONDANSETRON HCL 4 MG/2ML IJ SOLN
INTRAMUSCULAR | Status: DC | PRN
  Administered 2015-06-26: 4 mg via INTRAVENOUS

## 2015-06-26 MED ORDER — MORPHINE SULFATE (PF) 2 MG/ML IV SOLN: 2.0000 mg | INTRAVENOUS | Status: DC | PRN

## 2015-06-26 MED ORDER — OXYCODONE HCL 5 MG PO TABS
ORAL_TABLET | ORAL | Status: DC
Start: 2015-06-26 — End: 2015-06-26
  Filled 2015-06-26: qty 1

## 2015-06-26 MED ORDER — PROPOFOL 10 MG/ML IV BOLUS
INTRAVENOUS | Status: AC
  Filled 2015-06-26: qty 20

## 2015-06-26 MED ORDER — LACTATED RINGERS IV SOLN
INTRAVENOUS | Status: DC
  Administered 2015-06-26 (×2): via INTRAVENOUS

## 2015-06-26 MED ORDER — 0.9 % SODIUM CHLORIDE (POUR BTL) OPTIME
TOPICAL | Status: DC | PRN
  Administered 2015-06-26: 1000 mL

## 2015-06-26 MED ORDER — PROPOFOL 10 MG/ML IV BOLUS
INTRAVENOUS | Status: DC | PRN
  Administered 2015-06-26: 180 mg via INTRAVENOUS

## 2015-06-26 MED ORDER — METRONIDAZOLE IN NACL 5-0.79 MG/ML-% IV SOLN
500.0000 mg | Freq: Once | INTRAVENOUS | Status: DC
  Filled 2015-06-26: qty 100

## 2015-06-26 MED ORDER — SODIUM CHLORIDE 0.9% FLUSH: 3.0000 mL | INTRAVENOUS | Status: DC | PRN

## 2015-06-26 MED ORDER — SODIUM CHLORIDE 0.9% FLUSH: 3.0000 mL | Freq: Two times a day (BID) | INTRAVENOUS | Status: DC

## 2015-06-26 MED ORDER — BUPIVACAINE-EPINEPHRINE (PF) 0.25% -1:200000 IJ SOLN
INTRAMUSCULAR | Status: AC
  Filled 2015-06-26: qty 30

## 2015-06-26 MED ORDER — SODIUM CHLORIDE 0.9 % IR SOLN
Status: DC | PRN
  Administered 2015-06-26: 1000 mL

## 2015-06-26 MED ORDER — FENTANYL CITRATE (PF) 100 MCG/2ML IJ SOLN
INTRAMUSCULAR | Status: AC
  Filled 2015-06-26: qty 2

## 2015-06-26 MED ORDER — FENTANYL CITRATE (PF) 250 MCG/5ML IJ SOLN
INTRAMUSCULAR | Status: AC
  Filled 2015-06-26: qty 5

## 2015-06-26 MED ORDER — ROCURONIUM BROMIDE 100 MG/10ML IV SOLN
INTRAVENOUS | Status: DC | PRN
  Administered 2015-06-26: 50 mg via INTRAVENOUS

## 2015-06-26 MED ORDER — SUGAMMADEX SODIUM 200 MG/2ML IV SOLN
INTRAVENOUS | Status: DC | PRN
  Administered 2015-06-26 (×2): 200 mg via INTRAVENOUS

## 2015-06-26 MED ORDER — FENTANYL CITRATE (PF) 100 MCG/2ML IJ SOLN
INTRAMUSCULAR | Status: DC | PRN
  Administered 2015-06-26: 150 ug via INTRAVENOUS

## 2015-06-26 MED ORDER — SUCCINYLCHOLINE CHLORIDE 20 MG/ML IJ SOLN
INTRAMUSCULAR | Status: DC | PRN
  Administered 2015-06-26: 120 mg via INTRAVENOUS

## 2015-06-26 MED ORDER — MEPERIDINE HCL 25 MG/ML IJ SOLN: 6.2500 mg | INTRAMUSCULAR | Status: DC | PRN

## 2015-06-26 MED ORDER — MIDAZOLAM HCL 2 MG/2ML IJ SOLN
INTRAMUSCULAR | Status: AC
  Filled 2015-06-26: qty 2

## 2015-06-26 SURGICAL SUPPLY — 53 items
APL SKNCLS STERI-STRIP NONHPOA (GAUZE/BANDAGES/DRESSINGS) ×1
APPLIER CLIP 5 13 M/L LIGAMAX5 (MISCELLANEOUS)
APR CLP MED LRG 5 ANG JAW (MISCELLANEOUS)
BAG SPEC RTRVL 10 TROC 200 (ENDOMECHANICALS) ×1
BENZOIN TINCTURE PRP APPL 2/3 (GAUZE/BANDAGES/DRESSINGS) ×3 IMPLANT
BLADE SURG ROTATE 9660 (MISCELLANEOUS) IMPLANT
CANISTER SUCTION 2500CC (MISCELLANEOUS) ×3 IMPLANT
CHLORAPREP W/TINT 26ML (MISCELLANEOUS) ×3 IMPLANT
CLIP APPLIE 5 13 M/L LIGAMAX5 (MISCELLANEOUS) IMPLANT
CLIP LIGATING HEMO O LOK GREEN (MISCELLANEOUS) ×3 IMPLANT
CLOSURE WOUND 1/2 X4 (GAUZE/BANDAGES/DRESSINGS) ×1
COVER SURGICAL LIGHT HANDLE (MISCELLANEOUS) ×3 IMPLANT
COVER TRANSDUCER ULTRASND (DRAPES) ×1 IMPLANT
DEVICE TROCAR PUNCTURE CLOSURE (ENDOMECHANICALS) ×3 IMPLANT
ELECT REM PT RETURN 9FT ADLT (ELECTROSURGICAL) ×3
ELECTRODE REM PT RTRN 9FT ADLT (ELECTROSURGICAL) ×1 IMPLANT
ENDOLOOP SUT PDS II  0 18 (SUTURE) ×8
ENDOLOOP SUT PDS II 0 18 (SUTURE) ×3 IMPLANT
GAUZE SPONGE 2X2 8PLY STRL LF (GAUZE/BANDAGES/DRESSINGS) ×1 IMPLANT
GLOVE BIO SURGEON STRL SZ 6.5 (GLOVE) ×1 IMPLANT
GLOVE BIO SURGEON STRL SZ7 (GLOVE) ×2 IMPLANT
GLOVE BIO SURGEON STRL SZ7.5 (GLOVE) ×3 IMPLANT
GLOVE BIO SURGEONS STRL SZ 6.5 (GLOVE) ×1
GLOVE BIOGEL PI IND STRL 7.0 (GLOVE) IMPLANT
GLOVE BIOGEL PI INDICATOR 7.0 (GLOVE) ×4
GLOVE SURG SS PI 6.5 STRL IVOR (GLOVE) ×4 IMPLANT
GOWN STRL REUS W/ TWL LRG LVL3 (GOWN DISPOSABLE) ×2 IMPLANT
GOWN STRL REUS W/ TWL XL LVL3 (GOWN DISPOSABLE) ×1 IMPLANT
GOWN STRL REUS W/TWL LRG LVL3 (GOWN DISPOSABLE) ×6
GOWN STRL REUS W/TWL XL LVL3 (GOWN DISPOSABLE) ×6
KIT BASIN OR (CUSTOM PROCEDURE TRAY) ×3 IMPLANT
KIT ROOM TURNOVER OR (KITS) ×3 IMPLANT
NDL INSUFFLATION 14GA 120MM (NEEDLE) ×1 IMPLANT
NEEDLE INSUFFLATION 14GA 120MM (NEEDLE) ×3 IMPLANT
NS IRRIG 1000ML POUR BTL (IV SOLUTION) ×3 IMPLANT
PAD ARMBOARD 7.5X6 YLW CONV (MISCELLANEOUS) ×6 IMPLANT
POUCH RETRIEVAL ECOSAC 10 (ENDOMECHANICALS) IMPLANT
POUCH RETRIEVAL ECOSAC 10MM (ENDOMECHANICALS) ×2
SCISSORS LAP 5X35 DISP (ENDOMECHANICALS) ×3 IMPLANT
SET IRRIG TUBING LAPAROSCOPIC (IRRIGATION / IRRIGATOR) ×3 IMPLANT
SLEEVE ENDOPATH XCEL 5M (ENDOMECHANICALS) ×3 IMPLANT
SPECIMEN JAR SMALL (MISCELLANEOUS) ×3 IMPLANT
SPONGE GAUZE 2X2 STER 10/PKG (GAUZE/BANDAGES/DRESSINGS) ×4
STRIP CLOSURE SKIN 1/2X4 (GAUZE/BANDAGES/DRESSINGS) ×2 IMPLANT
SUT MNCRL AB 3-0 PS2 18 (SUTURE) ×3 IMPLANT
SUT SILK 2 0 SH (SUTURE) IMPLANT
TOWEL OR 17X24 6PK STRL BLUE (TOWEL DISPOSABLE) ×1 IMPLANT
TOWEL OR 17X26 10 PK STRL BLUE (TOWEL DISPOSABLE) ×3 IMPLANT
TRAY FOLEY CATH 16FR SILVER (SET/KITS/TRAYS/PACK) ×3 IMPLANT
TRAY LAPAROSCOPIC MC (CUSTOM PROCEDURE TRAY) ×3 IMPLANT
TROCAR XCEL NON-BLD 11X100MML (ENDOMECHANICALS) ×3 IMPLANT
TROCAR XCEL NON-BLD 5MMX100MML (ENDOMECHANICALS) ×3 IMPLANT
TUBING INSUFFLATION (TUBING) ×3 IMPLANT

## 2015-06-26 NOTE — Discharge Instructions (Signed)
[facility] ______[facility] °LAPAROSCOPIC SURGERY: POST OP INSTRUCTIONS °Always review your discharge instruction sheet given to you by the facility where your surgery was performed. °IF YOU HAVE DISABILITY OR FAMILY LEAVE FORMS, YOU MUST BRING THEM TO THE OFFICE FOR PROCESSING.   °DO NOT GIVE THEM TO YOUR DOCTOR. ° °1. A prescription for pain medication may be given to you upon discharge.  Take your pain medication as prescribed, if needed.  If narcotic pain medicine is not needed, then you may take acetaminophen (Tylenol) or ibuprofen (Advil) as needed. °2. Take your usually prescribed medications unless otherwise directed. °3. If you need a refill on your pain medication, please contact your pharmacy.  They will contact our office to request authorization. Prescriptions will not be filled after 5pm or on week-ends. °4. You should follow a light diet the first few days after arrival home, such as soup and crackers, etc.  Be sure to include lots of fluids daily. °5. Most patients will experience some swelling and bruising in the area of the incisions.  Ice packs will help.  Swelling and bruising can take several days to resolve.  °6. It is common to experience some constipation if taking pain medication after surgery.  Increasing fluid intake and taking a stool softener (such as Colace) will usually help or prevent this problem from occurring.  A mild laxative (Milk of Magnesia or Miralax) should be taken according to package instructions if there are no bowel movements after 48 hours. °7. Unless discharge instructions indicate otherwise, you may remove your bandages 24-48 hours after surgery, and you may shower at that time.  You may have steri-strips (small skin tapes) in place directly over the incision.  These strips should be left on the skin for 7-10 days.  If your surgeon used skin glue on the incision, you may shower in 24 hours.  The glue will flake off over the next 2-3 weeks.  Any sutures or  staples will be removed at the office during your follow-up visit. °8. ACTIVITIES:  You may resume regular (light) daily activities beginning the next day--such as daily self-care, walking, climbing stairs--gradually increasing activities as tolerated.  You may have sexual intercourse when it is comfortable.  Refrain from any heavy lifting or straining until approved by your doctor. °a. You may drive when you are no longer taking prescription pain medication, you can comfortably wear a seatbelt, and you can safely maneuver your car and apply brakes. °b. RETURN TO WORK:  __________________________________________________________ °9. You should see your doctor in the office for a follow-up appointment approximately 2-3 weeks after your surgery.  Make sure that you call for this appointment within a day or two after you arrive home to insure a convenient appointment time. °10. OTHER INSTRUCTIONS: __________________________________________________________________________________________________________________________ __________________________________________________________________________________________________________________________ °WHEN TO CALL YOUR DOCTOR: °1. Fever over 101.0 °2. Inability to urinate °3. Continued bleeding from incision. °4. Increased pain, redness, or drainage from the incision. °5. Increasing abdominal pain ° °The clinic staff is available to answer your questions during regular business hours.  Please don’t hesitate to call and ask to speak to one of the nurses for clinical concerns.  If you have a medical emergency, go to the nearest emergency room or call 911.  A surgeon from [facility] is always on call at the hospital. °[address], [address], [place], [place]  [address] ? [address] [address], [place], [place]   [address] °[phone] ? [phone] ? FAX [phone] °Web site:   [email or web address] °

## 2015-06-26 NOTE — Anesthesia Procedure Notes (Signed)
Procedure Name: Intubation Date/Time: 06/26/2015 8:34 AM Performed by: Manuela Schwartz B Pre-anesthesia Checklist: Patient identified, Emergency Drugs available, Suction available, Patient being monitored and Timeout performed Patient Re-evaluated:Patient Re-evaluated prior to inductionOxygen Delivery Method: Circle system utilized Preoxygenation: Pre-oxygenation with 100% oxygen Intubation Type: IV induction and Rapid sequence Laryngoscope Size: Mac and 3 Grade View: Grade II Tube type: Oral Tube size: 7.0 mm Number of attempts: 1 Airway Equipment and Method: Stylet Placement Confirmation: ETT inserted through vocal cords under direct vision,  positive ETCO2 and breath sounds checked- equal and bilateral Secured at: 20 cm Tube secured with: Tape Dental Injury: Teeth and Oropharynx as per pre-operative assessment

## 2015-06-26 NOTE — H&P (Signed)
History of Present Illness Ralene Ok MD; 05/26/2015 10:16 AM) Patient words: discuss HIDA.  The patient is a 65 year old female who presents for evaluation of gall stones. The patient is a 65 year old female who previously was seen secondary to right upper quadrant pain. Patient underwent HIDA scan which revealed an ejection fraction of 18.2%. Patient also had a CT scan which revealed a dilated distal appendix.  Patient states she continues with epigastric pain, reflux.   Allergies (Ammie Eversole, LPN; 579FGE 579FGE AM) Crestor *ANTIHYPERLIPIDEMICS*  Medication History (Ammie Eversole, LPN; 579FGE 579FGE AM) Metoprolol Succinate ER (100MG  Tablet ER 24HR, Oral) Active. Omeprazole (20MG  Capsule DR, Oral) Active. Dicyclomine HCl (20MG  Tablet, Oral three times daily) Active. Allopurinol (300MG  Tablet, Oral) Active. Gabapentin (300MG  Capsule, Oral two times daily) Active. Hydrocodone-Acetaminophen (7.5-325MG  Tablet, Oral as needed) Active. Flexeril (10MG  Tablet, Oral) Active. QUEtiapine Fumarate (300MG  Tablet, Oral two times at bedtime) Active. Fish Oil Burp-Less (1200MG  Capsule, Oral) Active. Aspirin (81MG  Tablet, Oral) Active. Vitamin B12 (100MCG Tablet, Oral) Active. Flonase (50MCG/ACT Suspension, Nasal) Active. Losartan Potassium (100MG  Tablet, Oral) Active. HydroCHLOROthiazide (25MG  Tablet, Oral) Active. AmLODIPine Besylate (10MG  Tablet, Oral) Active. Evista (60MG  Tablet, Oral) Active. Lancets 30G Active. Cymbalta (30MG  Capsule DR Part, Oral) Active. Naproxen (500MG  Tablet, Oral) Active. Calcium (500MG  Tablet, Oral) Active. Medications Reconciled    Review of Systems Ralene Ok, MD; 05/26/2015 10:17 AM) General Present- Feeling well. Not Present- Fever. Respiratory Not Present- Cough and Difficulty Breathing. Cardiovascular Not Present- Chest Pain. Gastrointestinal Present- Abdominal Pain and Nausea. Musculoskeletal Not Present-  Myalgia. Neurological Not Present- Weakness.   BP 156/85 mmHg  Pulse 90  Temp(Src) 98.6 F (37 C) (Oral)  Resp 20  Ht 5' 5.5" (1.664 m)  Wt 84.369 kg (186 lb)  BMI 30.47 kg/m2  SpO2 99%   Physical Exam Ralene Ok, MD; 05/26/2015 10:17 AM) General Mental Status-Alert. General Appearance-Consistent with stated age. Hydration-Well hydrated. Voice-Normal.  Head and Neck Head-normocephalic, atraumatic with no lesions or palpable masses.  Eye Eyeball - Bilateral-Extraocular movements intact. Sclera/Conjunctiva - Bilateral-No scleral icterus.  Chest and Lung Exam Chest and lung exam reveals -quiet, even and easy respiratory effort with no use of accessory muscles. Inspection Chest Wall - Normal. Back - normal.  Cardiovascular Cardiovascular examination reveals -normal heart sounds, regular rate and rhythm with no murmurs.  Abdomen Inspection Normal Exam - No Hernias. Palpation/Percussion Normal exam - Soft, Non Tender, No Rebound tenderness, No Rigidity (guarding) and No hepatosplenomegaly. Auscultation Normal exam - Bowel sounds normal.  Neurologic Neurologic evaluation reveals -alert and oriented x 3 with no impairment of recent or remote memory. Mental Status-Normal.  Musculoskeletal Normal Exam - Left-Upper Extremity Strength Normal and Lower Extremity Strength Normal. Normal Exam - Right-Upper Extremity Strength Normal, Lower Extremity Weakness.    Assessment & Plan Ralene Ok MD; 05/26/2015 10:16 AM) GALLSTONES (K80.20) Impression: 65 year old female with biliary dyskinesia. Patient also has dilated appendix which CT scan reveals possible mucocele.  1. Will proceed to the operative for laparoscopic cholecystectomy and lap appendectomy. 2. Risks and benefits were discussed with the patient to generally include, but not limited to: infection, bleeding, possible need for post op ERCP, damage to the bile ducts, bile leak, and  possible need for further surgery. Alternatives were offered and described. All questions were answered and the patient voiced understanding of the procedure and wishes to proceed at this point with a laparoscopic cholecystectomy

## 2015-06-26 NOTE — Transfer of Care (Signed)
Immediate Anesthesia Transfer of Care Note  Patient: Tammy Mullins  Procedure(s) Performed: Procedure(s): LAPAROSCOPIC CHOLECYSTECTOMY (N/A) LAPAROSCOPIC APPENDECTOMY (N/A)  Patient Location: PACU  Anesthesia Type:General  Level of Consciousness: awake, patient cooperative and responds to stimulation  Airway & Oxygen Therapy: Patient Spontanous Breathing and Patient connected to face mask oxygen  Post-op Assessment: Report given to RN and Post -op Vital signs reviewed and stable  Post vital signs: Reviewed and stable  Last Vitals:  Filed Vitals:   06/26/15 0724  BP: 156/85  Pulse: 90  Temp: 37 C  Resp: 20    Last Pain:  Filed Vitals:   06/26/15 0958  PainSc: 5       Patients Stated Pain Goal: 3 ([id number])  Complications: No apparent anesthesia complications

## 2015-06-26 NOTE — Op Note (Signed)
06/26/2015  9:32 AM  PATIENT:  Tammy Mullins  65 y.o. female  PRE-OPERATIVE DIAGNOSIS:  Biliary dyskinesia, dilated appendix  POST-OPERATIVE DIAGNOSIS:  Gallstones, dilated appendix  PROCEDURE:  Procedure(s): LAPAROSCOPIC CHOLECYSTECTOMY (N/A) LAPAROSCOPIC APPENDECTOMY (N/A)  SURGEON:  Surgeon(s) and Role:    * Ralene Ok, MD - Primary   ANESTHESIA:   local and general  EBL:  Total I/O In: 1000 [I.V.:1000] Out: 220 [Urine:200; Blood:<5cc]  BLOOD ADMINISTERED:none  DRAINS: none   LOCAL MEDICATIONS USED:  BUPIVICAINE   SPECIMEN:  Source of Specimen:  Appendix, gallbladder  DISPOSITION OF SPECIMEN:  PATHOLOGY  COUNTS:  YES  TOURNIQUET:  * No tourniquets in log *  DICTATION: .Dragon Dictation  EBL: [id number]   Complications: none   Counts: reported as correct x 2   Findings:gallstones, dilated appendiceal tip  Indications for procedure: Pt is a 65 y/o F with RUQ pain and seen to have a low EF.  Patient also with a dilated tip of her appendix. There was concern for a mucocele.  Details of the procedure: The patient was taken to the operating and placed in the supine position with bilateral SCDs in place. A time out was called and all facts were verified. A pneumoperitoneum was obtained via A Veress needle technique to a pressure of 30mm of mercury. A 68mm trochar was then placed in the right upper quadrant under visualization, and there were no injuries to any abdominal organs. A 11 mm port was then placed in the umbilical region after infiltrating with local anesthesia under direct visualization. A second epigastric port was placed under direct visualization.   The gallbladder was identified and retracted, the peritoneum was then sharply dissected from the gallbladder and this dissection was carried down to Calot's triangle. The cystic duct was identified and stripped away circumferentially and seen going into the gallbladder 360, the critical angle was obtained.    2 clips were placed proximally one distally and the cystic duct transected. The cystic artery was identified and 2 clips placed proximally and one distally and transected. We then proceeded to remove the gallbladder off the hepatic fossa with Bovie cautery. A retrieval bag was then placed in the abdomen and gallbladder placed in the bag. The hepatic fossa was then reexamined and hemostasis was achieved with Bovie cautery and was excellent at this portion of the case. The subhepatic fossa and perihepatic fossa was then irrigated until the effluent was clear.    The patient was in position with Trendelenburg, right side up.  The appendix was identified and seen to be noninflamed, with dilated tip.   The appendix was cleaned down to the appendiceal base. The mesoappendix was then incised and the appendiceal artery was cauterized.  The the appendiceal base was clean.  At this time an Endoloop was placed proximallyx2 and one distally and the appendix was transected between these 2.  The specimen placed in the retrieval bag. The appendiceal stump was cauterized. We evacuate the fluid from the pelvis until the effluent was clear.    The specimen bag and specimen were removed from the abdominal cavity.  The 11 mm trocar fascia was reapproximated with the Endo Close #1 Vicryl x2. The pneumoperitoneum was evacuated and all trochars removed under direct visulalization. The skin was then closed with 4-0 Monocryl and the skin dressed with Steri-Strips, gauze, and tape. The patient was awaken from general anesthesia and taken to the recovery room in stable condition.    PLAN OF CARE: Discharge after PACU  PATIENT DISPOSITION:  PACU - hemodynamically stable.   Delay start of Pharmacological VTE agent (>24hrs) due to surgical blood loss or risk of bleeding: not applicable

## 2015-06-26 NOTE — Anesthesia Postprocedure Evaluation (Signed)
Anesthesia Post Note  Patient: Tammy Mullins  Procedure(s) Performed: Procedure(s) (LRB): LAPAROSCOPIC CHOLECYSTECTOMY (N/A) LAPAROSCOPIC APPENDECTOMY (N/A)  Patient location during evaluation: PACU Anesthesia Type: General Level of consciousness: awake and alert Pain management: pain level controlled Vital Signs Assessment: post-procedure vital signs reviewed and stable Respiratory status: spontaneous breathing, nonlabored ventilation, respiratory function stable and patient connected to nasal cannula oxygen Cardiovascular status: blood pressure returned to baseline and stable Postop Assessment: no signs of nausea or vomiting Anesthetic complications: no    Last Vitals:  Filed Vitals:   06/26/15 1106 06/26/15 1112  BP: 165/96 119/78  Pulse: 85   Temp:    Resp:      Last Pain:  Filed Vitals:   06/26/15 1126  PainSc: 5                  Montez Hageman

## 2015-06-26 NOTE — Anesthesia Preprocedure Evaluation (Addendum)
Anesthesia Evaluation  Patient identified by MRN, date of birth, ID band Patient awake    Reviewed: Allergy & Precautions, NPO status , Patient's Chart, lab work & pertinent test results  History of Anesthesia Complications Negative for: history of anesthetic complications  Airway Mallampati: II  TM Distance: >3 FB Neck ROM: Limited    Dental no notable dental hx. (+) Teeth Intact, Dental Advisory Given   Pulmonary sleep apnea ,    Pulmonary exam normal breath sounds clear to auscultation       Cardiovascular hypertension, Pt. on medications Normal cardiovascular exam Rhythm:Regular Rate:Normal     Neuro/Psych PSYCHIATRIC DISORDERS Anxiety Depression  Neuromuscular disease negative neurological ROS     GI/Hepatic Neg liver ROS, GERD  ,  Endo/Other  diabetes (diet controlled), Type 2  Renal/GU negative Renal ROS  negative genitourinary   Musculoskeletal  (+) Arthritis , Fibromyalgia -, narcotic dependent  Abdominal   Peds negative pediatric ROS (+)  Hematology negative hematology ROS (+)   Anesthesia Other Findings   Reproductive/Obstetrics negative OB ROS                           Anesthesia Physical Anesthesia Plan  ASA: II  Anesthesia Plan: General   Post-op Pain Management:    Induction: Intravenous  Airway Management Planned: Oral ETT  Additional Equipment:   Intra-op Plan:   Post-operative Plan: Extubation in OR  Informed Consent: I have reviewed the patients History and Physical, chart, labs and discussed the procedure including the risks, benefits and alternatives for the proposed anesthesia with the patient or authorized representative who has indicated his/her understanding and acceptance.     Plan Discussed with: Anesthesiologist and Surgeon  Anesthesia Plan Comments:         Anesthesia Quick Evaluation

## 2015-06-29 ENCOUNTER — Encounter (HOSPITAL_COMMUNITY): Payer: Self-pay | Admitting: General Surgery

## 2015-07-16 DIAGNOSIS — Z5181 Encounter for therapeutic drug level monitoring: Secondary | ICD-10-CM | POA: Diagnosis not present

## 2015-07-20 DIAGNOSIS — F331 Major depressive disorder, recurrent, moderate: Secondary | ICD-10-CM | POA: Diagnosis not present

## 2015-08-11 ENCOUNTER — Other Ambulatory Visit: Payer: Self-pay | Admitting: Gynecology

## 2015-08-11 DIAGNOSIS — Z1231 Encounter for screening mammogram for malignant neoplasm of breast: Secondary | ICD-10-CM

## 2015-08-19 ENCOUNTER — Ambulatory Visit: Payer: PPO

## 2015-08-27 ENCOUNTER — Ambulatory Visit
Admission: RE | Admit: 2015-08-27 | Discharge: 2015-08-27 | Disposition: A | Discharge status: Home / Self Care | Payer: PPO | Source: Ambulatory Visit | Attending: Gynecology | Admitting: Gynecology

## 2015-08-27 DIAGNOSIS — Z1231 Encounter for screening mammogram for malignant neoplasm of breast: Secondary | ICD-10-CM | POA: Diagnosis not present

## 2015-09-07 DIAGNOSIS — F331 Major depressive disorder, recurrent, moderate: Secondary | ICD-10-CM | POA: Diagnosis not present

## 2015-09-25 DIAGNOSIS — H524 Presbyopia: Secondary | ICD-10-CM | POA: Diagnosis not present

## 2015-09-25 DIAGNOSIS — H5203 Hypermetropia, bilateral: Secondary | ICD-10-CM | POA: Diagnosis not present

## 2015-09-25 DIAGNOSIS — H52223 Regular astigmatism, bilateral: Secondary | ICD-10-CM | POA: Diagnosis not present

## 2015-09-28 DIAGNOSIS — M1711 Unilateral primary osteoarthritis, right knee: Secondary | ICD-10-CM | POA: Diagnosis not present

## 2015-10-12 DIAGNOSIS — M1711 Unilateral primary osteoarthritis, right knee: Secondary | ICD-10-CM | POA: Diagnosis not present

## 2015-10-12 DIAGNOSIS — M25561 Pain in right knee: Secondary | ICD-10-CM | POA: Diagnosis not present

## 2015-10-19 DIAGNOSIS — F331 Major depressive disorder, recurrent, moderate: Secondary | ICD-10-CM | POA: Diagnosis not present

## 2015-10-26 DIAGNOSIS — Z79899 Other long term (current) drug therapy: Secondary | ICD-10-CM | POA: Diagnosis not present

## 2015-10-26 DIAGNOSIS — M1A00X0 Idiopathic chronic gout, unspecified site, without tophus (tophi): Secondary | ICD-10-CM | POA: Diagnosis not present

## 2015-10-26 DIAGNOSIS — M25561 Pain in right knee: Secondary | ICD-10-CM | POA: Diagnosis not present

## 2015-10-26 DIAGNOSIS — R5381 Other malaise: Secondary | ICD-10-CM | POA: Diagnosis not present

## 2015-10-26 DIAGNOSIS — M797 Fibromyalgia: Secondary | ICD-10-CM | POA: Diagnosis not present

## 2015-10-26 DIAGNOSIS — R300 Dysuria: Secondary | ICD-10-CM | POA: Diagnosis not present

## 2015-11-24 DIAGNOSIS — F331 Major depressive disorder, recurrent, moderate: Secondary | ICD-10-CM | POA: Diagnosis not present

## 2015-12-22 ENCOUNTER — Telehealth: Payer: Self-pay | Admitting: Rheumatology

## 2015-12-22 NOTE — Telephone Encounter (Signed)
Pt needs a refill of hydrocodone. °

## 2015-12-23 MED ORDER — HYDROCODONE-ACETAMINOPHEN 5-325 MG PO TABS: 1.0000 | ORAL_TABLET | Freq: Every evening | ORAL | 0 refills | Status: DC | PRN

## 2015-12-23 NOTE — Telephone Encounter (Signed)
Called patient advised ready for pick up

## 2015-12-23 NOTE — Telephone Encounter (Signed)
ok to refill Hydrodocone

## 2015-12-23 NOTE — Telephone Encounter (Signed)
10/26/15 last visit 03/23/15 next visit  UDS 07/22/15 narcotic agreement 05/27/15  ok to refill Hydrodocone?

## 2015-12-29 ENCOUNTER — Other Ambulatory Visit (HOSPITAL_COMMUNITY)
Admission: RE | Admit: 2015-12-29 | Discharge: 2015-12-29 | Disposition: A | Discharge status: Home / Self Care | Payer: PPO | Source: Ambulatory Visit | Attending: Family Medicine | Admitting: Family Medicine

## 2015-12-29 ENCOUNTER — Other Ambulatory Visit: Payer: Self-pay | Admitting: Rheumatology

## 2015-12-29 ENCOUNTER — Other Ambulatory Visit: Payer: Self-pay | Admitting: Family Medicine

## 2015-12-29 DIAGNOSIS — Z23 Encounter for immunization: Secondary | ICD-10-CM | POA: Diagnosis not present

## 2015-12-29 DIAGNOSIS — Z124 Encounter for screening for malignant neoplasm of cervix: Secondary | ICD-10-CM | POA: Insufficient documentation

## 2015-12-29 DIAGNOSIS — K219 Gastro-esophageal reflux disease without esophagitis: Secondary | ICD-10-CM | POA: Diagnosis not present

## 2015-12-29 DIAGNOSIS — M8580 Other specified disorders of bone density and structure, unspecified site: Secondary | ICD-10-CM | POA: Diagnosis not present

## 2015-12-29 DIAGNOSIS — Z0000 Encounter for general adult medical examination without abnormal findings: Secondary | ICD-10-CM | POA: Diagnosis not present

## 2015-12-29 DIAGNOSIS — Z1389 Encounter for screening for other disorder: Secondary | ICD-10-CM | POA: Diagnosis not present

## 2015-12-29 DIAGNOSIS — I10 Essential (primary) hypertension: Secondary | ICD-10-CM | POA: Diagnosis not present

## 2015-12-29 DIAGNOSIS — K589 Irritable bowel syndrome without diarrhea: Secondary | ICD-10-CM | POA: Diagnosis not present

## 2015-12-29 NOTE — Telephone Encounter (Signed)
Last visit 10/26/15 Next visit 03/23/15 Labs WNL 10/26/15  Ok to refill per Dr Estanislado Pandy

## 2015-12-31 LAB — CYTOLOGY - PAP: Diagnosis: NEGATIVE

## 2016-01-05 ENCOUNTER — Other Ambulatory Visit: Payer: Self-pay | Admitting: Rheumatology

## 2016-01-05 DIAGNOSIS — F331 Major depressive disorder, recurrent, moderate: Secondary | ICD-10-CM | POA: Diagnosis not present

## 2016-01-05 DIAGNOSIS — I10 Essential (primary) hypertension: Secondary | ICD-10-CM | POA: Diagnosis not present

## 2016-01-06 NOTE — Telephone Encounter (Signed)
Last visit 10/26/15 Next visit 03/23/15 Ok to refill per Dr Estanislado Pandy

## 2016-02-03 ENCOUNTER — Ambulatory Visit (INDEPENDENT_AMBULATORY_CARE_PROVIDER_SITE_OTHER): Payer: Self-pay | Admitting: Orthopedic Surgery

## 2016-02-11 ENCOUNTER — Other Ambulatory Visit: Payer: Self-pay | Admitting: Rheumatology

## 2016-02-11 NOTE — Telephone Encounter (Signed)
Patient is requesting refill of oxycodone

## 2016-02-12 MED ORDER — HYDROCODONE-ACETAMINOPHEN 5-325 MG PO TABS: 1.0000 | ORAL_TABLET | Freq: Every evening | ORAL | 0 refills | Status: DC | PRN

## 2016-02-12 NOTE — Telephone Encounter (Signed)
Contacted patient to verify prescription she needs refilled. Patient states she is requesting a refill on Hydrocodone. Patient advised she is due to update her UDS.   Last Visit: 10/26/15 Next Visit: 03/22/16 UDS: 07/22/15 Narc agreement: 05/27/15  Okay to refill Hydrocodone?

## 2016-02-12 NOTE — Telephone Encounter (Signed)
Left message for patient to advise prescription is ready for pick up

## 2016-02-15 ENCOUNTER — Other Ambulatory Visit: Payer: Self-pay | Admitting: *Deleted

## 2016-02-15 DIAGNOSIS — Z5181 Encounter for therapeutic drug level monitoring: Secondary | ICD-10-CM | POA: Diagnosis not present

## 2016-02-16 DIAGNOSIS — F331 Major depressive disorder, recurrent, moderate: Secondary | ICD-10-CM | POA: Diagnosis not present

## 2016-02-18 LAB — PAIN MGMT, PROFILE 5 W/CONF, U
ALPHAHYDROXYALPRAZOLAM: NEGATIVE ng/mL (ref <25)
ALPHAHYDROXYMIDAZOLAM: NEGATIVE ng/mL (ref <50)
AMPHETAMINES: NEGATIVE ng/mL (ref <500)
Alphahydroxytriazolam: NEGATIVE ng/mL (ref <50)
Aminoclonazepam: NEGATIVE ng/mL (ref <25)
BARBITURATES: NEGATIVE ng/mL (ref <300)
BENZODIAZEPINES: NEGATIVE ng/mL (ref <100)
CODEINE: NEGATIVE ng/mL (ref <50)
Cocaine Metabolite: NEGATIVE ng/mL (ref <150)
HYDROMORPHONE: 135 ng/mL — AB (ref <50)
HYDROXYETHYLFLURAZEPAM: NEGATIVE ng/mL (ref <50)
Hydrocodone: 480 ng/mL — ABNORMAL HIGH (ref <50)
LORAZEPAM: NEGATIVE ng/mL (ref <50)
MARIJUANA METABOLITE: NEGATIVE ng/mL (ref <20)
METHADONE METABOLITE: NEGATIVE ng/mL (ref <100)
MORPHINE: NEGATIVE ng/mL (ref <50)
Nordiazepam: NEGATIVE ng/mL (ref <50)
Norhydrocodone: 1956 ng/mL — ABNORMAL HIGH (ref <50)
OXIDANT: NEGATIVE ug/mL (ref <200)
OXYCODONE: NEGATIVE ng/mL (ref <100)
Opiates: POSITIVE ng/mL — AB (ref <100)
Oxazepam: NEGATIVE ng/mL (ref <50)
PH: 6.62 (ref 4.5–9.0)
Temazepam: NEGATIVE ng/mL (ref <50)

## 2016-03-14 ENCOUNTER — Other Ambulatory Visit: Payer: Self-pay | Admitting: Rheumatology

## 2016-03-15 NOTE — Telephone Encounter (Signed)
Last Visit: 10/26/15 Next Visit: 03/22/16 Labs: 10/27/15 ALT 32  Okay to refill Gabapentin?

## 2016-03-15 NOTE — Telephone Encounter (Signed)
Last Visit: 10/26/15 Next Visit: 03/22/16 Labs: 10/27/15 ALT 32  Okay to refill Allopurinol?

## 2016-03-21 DIAGNOSIS — R52 Pain, unspecified: Secondary | ICD-10-CM | POA: Insufficient documentation

## 2016-03-21 DIAGNOSIS — R5383 Other fatigue: Secondary | ICD-10-CM | POA: Insufficient documentation

## 2016-03-21 DIAGNOSIS — Z8739 Personal history of other diseases of the musculoskeletal system and connective tissue: Secondary | ICD-10-CM | POA: Insufficient documentation

## 2016-03-21 DIAGNOSIS — M47816 Spondylosis without myelopathy or radiculopathy, lumbar region: Secondary | ICD-10-CM | POA: Insufficient documentation

## 2016-03-21 DIAGNOSIS — F5101 Primary insomnia: Secondary | ICD-10-CM | POA: Insufficient documentation

## 2016-03-21 DIAGNOSIS — E785 Hyperlipidemia, unspecified: Secondary | ICD-10-CM | POA: Insufficient documentation

## 2016-03-21 DIAGNOSIS — Z8659 Personal history of other mental and behavioral disorders: Secondary | ICD-10-CM | POA: Insufficient documentation

## 2016-03-21 DIAGNOSIS — Z8719 Personal history of other diseases of the digestive system: Secondary | ICD-10-CM | POA: Insufficient documentation

## 2016-03-22 ENCOUNTER — Ambulatory Visit: Payer: Self-pay | Admitting: Rheumatology

## 2016-03-31 DIAGNOSIS — F331 Major depressive disorder, recurrent, moderate: Secondary | ICD-10-CM | POA: Diagnosis not present

## 2016-04-19 DIAGNOSIS — Z8719 Personal history of other diseases of the digestive system: Secondary | ICD-10-CM | POA: Insufficient documentation

## 2016-04-19 DIAGNOSIS — Z8679 Personal history of other diseases of the circulatory system: Secondary | ICD-10-CM | POA: Insufficient documentation

## 2016-04-19 NOTE — Progress Notes (Signed)
Office Visit Note  Patient: Tammy Mullins             Date of Birth: 03/02/1950           MRN: [id number]             PCP: Vena Austria, MD Referring: Maury Dus, MD Visit Date: 04/28/2016 Occupation: @GUAROCC @    Subjective:  Left knee pain and swelling   History of Present Illness: Tammy Mullins is a 66 y.o. female with history of fibromyalgia osteoarthritis and gout. According to her her left knee has been hurting for the last 2 weeks and has been swollen. She states that the pain is better today than yesterday. She's having some difficulty walking. She had a gout flare in her right hand. It has been hurting for the last 2 days. She continues to have some lower back pain as well. She still have some generalized pain from fibromyalgia   Activities of Daily Living:  Patient reports morning stiffness for1 hour.   Patient Denies nocturnal pain.  Difficulty dressing/grooming: Denies Difficulty climbing stairs: Reports Difficulty getting out of chair: Reports Difficulty using hands for taps, buttons, cutlery, and/or writing: Reports   Review of Systems  Constitutional: Positive for fatigue. Negative for night sweats, weight gain, weight loss and weakness.  HENT: Negative for mouth sores, trouble swallowing, trouble swallowing, mouth dryness and nose dryness.   Eyes: Negative for pain, redness, visual disturbance and dryness.  Respiratory: Negative for cough, shortness of breath and difficulty breathing.   Cardiovascular: Negative for chest pain, palpitations, hypertension, irregular heartbeat and swelling in legs/feet.  Gastrointestinal: Negative for blood in stool, constipation and diarrhea.  Endocrine: Negative for increased urination.  Genitourinary: Negative for vaginal dryness.  Musculoskeletal: Positive for arthralgias, joint pain, joint swelling and morning stiffness. Negative for myalgias, muscle weakness, muscle tenderness and myalgias.  Skin: Negative  for color change, rash, hair loss, skin tightness, ulcers and sensitivity to sunlight.  Allergic/Immunologic: Negative for susceptible to infections.  Neurological: Negative for dizziness, memory loss and night sweats.  Hematological: Negative for swollen glands.  Psychiatric/Behavioral: Positive for sleep disturbance. Negative for depressed mood. The patient is not nervous/anxious.     PMFS History:  Patient Active Problem List   Diagnosis Date Noted  . Family history of GERD 04/19/2016  . History of hypertension 04/19/2016  . Other fatigue 03/21/2016  . Primary insomnia 03/21/2016  . History of gout 03/21/2016  . Osteoarthritis of lumbar spine 03/21/2016  . Dyslipidemia 03/21/2016  . History of IBS 03/21/2016  . History of depression 03/21/2016  . History of cholelithiasis 03/21/2016  . Pain management 03/21/2016  . Diabetes type 2, controlled ([place]) 01/09/2014  . Gout 09/20/2013  . OSA (obstructive sleep apnea) 09/16/2013  . Unspecified vitamin D deficiency 06/27/2013  . Sleep disturbance 06/27/2013  . Chest pain, unspecified 06/06/2012  . Vaginal atrophy 03/15/2012  . Osteopenia 03/15/2012  . Post-menopausal 03/15/2012  . Vitamin D deficiency 11/04/2010  . Depression 10/20/2007  . CHRONIC PAIN SYNDROME 10/20/2007  . ALLERGIC RHINITIS 10/20/2007  . HYPERCHOLESTEROLEMIA 10/03/2007  . MITRAL VALVE PROLAPSE, HX OF 10/03/2007  . GOUT 04/20/2007  . ANXIETY 04/20/2007  . ATTENTION DEFICIT DISORDER 04/20/2007  . Essential hypertension 04/20/2007  . GERD 04/20/2007  . IRRITABLE BOWEL SYNDROME 04/20/2007  . Primary osteoarthritis of both hands 04/20/2007  . DE QUERVAIN'S TENOSYNOVITIS 04/20/2007  . Fibromyalgia 04/20/2007  . DIVERTICULOSIS, COLON 01/09/2001    Past Medical History:  Diagnosis Date  . Allergic rhinitis,  cause unspecified   . Anxiety state, unspecified   . Attention deficit disorder without mention of hyperactivity   . Barrett's esophagus   . Chronic pain  syndrome   . Diabetes type 2, controlled ([place]) 01/09/2014  . Diverticulosis of colon (without mention of hemorrhage)   . Esophageal reflux   . Gout, unspecified   . Irritable bowel syndrome   . Myalgia and myositis, unspecified   . Osteoarthrosis, unspecified whether generalized or localized, unspecified site   . Personal history of unspecified circulatory disease   . Pure hypercholesterolemia   . Radial styloid tenosynovitis   . Unspecified essential hypertension   . Unspecified nonpsychotic mental disorder   . Unspecified pruritic disorder     Family History  Problem Relation Age of Onset  . Cirrhosis Father   . Cancer Maternal Grandmother     ?  . Lung disease Mother     ?   . Glaucoma Mother   . Glaucoma Brother   . Heart disease Sister   . Kidney disease Neg Hx   . Diabetes Neg Hx    Past Surgical History:  Procedure Laterality Date  . c-spine    . CARPAL TUNNEL RELEASE    . CHOLECYSTECTOMY N/A 06/26/2015   Procedure: LAPAROSCOPIC CHOLECYSTECTOMY;  Surgeon: Ralene Ok, MD;  Location: [facility];  Service: General;  Laterality: N/A;  . LAPAROSCOPIC APPENDECTOMY N/A 06/26/2015   Procedure: LAPAROSCOPIC APPENDECTOMY;  Surgeon: Ralene Ok, MD;  Location: [place];  Service: General;  Laterality: N/A;  . ROTATOR CUFF REPAIR    . rt  ankle    . 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             Objective: Vital Signs: BP 138/80   Pulse 92   Resp 16   Wt 192 lb (87.1 kg)   BMI 31.46 kg/m    Physical Exam  Constitutional: She is oriented to person, place, and time. She appears well-developed and well-nourished.  HENT:  Head:  Normocephalic and atraumatic.  Eyes: Conjunctivae and EOM are normal.  Neck: Normal range of motion.  Cardiovascular: Normal rate, regular rhythm, normal heart sounds and intact distal pulses.   Pulmonary/Chest: Effort normal and breath sounds normal.  Abdominal: Soft. Bowel sounds are normal.  Lymphadenopathy:    She has no cervical adenopathy.  Neurological: She is alert and oriented to person, place, and time.  Skin: Skin is warm and dry. Capillary refill takes less than 2 seconds.  Psychiatric: She has a normal mood and affect. Her behavior is normal.  Nursing note and vitals reviewed.     Musculoskeletal Exam:C-spine, thoracic, lumbar spine good range of motion. Shoulder joints elbow joints wrist joint MCPs PIPs DIPs with good range of motion with no synovitis. She does have DIP PIP thickening in her hands consistent with osteoarthritis. Hip joints are good range of motion. She is warmth swelling and a small effusion in her right knee joint. Ankle joints MTPs PIPs with good range of motion with no swelling.  CDAI Exam: No CDAI exam completed.    Investigation: Findings:  The urine drug  screen in May 2017 was positive for benzodiazepines and oxycodone. Narcotic agreement on file March 2017     Imaging: No results found.  Speciality Comments: No specialty comments available.    Procedures:  Large Joint Inj Date/Time: 04/28/2016 3:18 PM Performed by: Bo Merino Authorized by: Bo Merino   Consent Given by:  Patient Site marked: the procedure site was marked   Timeout: prior to procedure the correct patient, procedure, and site was verified   Indications:  Pain and joint swelling Location:  Knee Prep: patient was prepped and draped in usual sterile fashion   Needle Size:  27 G Needle Length:  1.5 inches Approach:  Medial Ultrasound Guidance: No   Fluoroscopic Guidance: No   Arthrogram: No   Medications:  1.5 mL lidocaine 1 %; 40 mg triamcinolone  acetonide 40 MG/ML Aspiration Attempted: Yes   Aspirate amount (mL):  0 Patient tolerance:  Patient tolerated the procedure well with no immediate complications   Allergies: Crestor [rosuvastatin calcium]   Assessment / Plan:     Visit Diagnoses: Acute pain of right knee: Patient is having increased joint pain and swelling for the last 2 weeks in her right knee. She good response to last cortisone injection in August 2017 given by Dr. Durward Fortes. She is having difficulty walking and limping different treatment options were discussed. I decided to proceed with cortisone injection after informed consent was obtained. The procedures described above.  Idiopathic chronic gout of multiple sites without tophus -patient believes that she may be having a flare of gout she had pain and swelling in her right hand which is subsided now. I will check her labs today. Plan: Uric acid  Medication monitoring encounter -she is on long-term allopurinol. We may have to adjust her dose of allopurinol history uric acid level. Plan: CBC with Differential/Platelet, COMPLETE METABOLIC PANEL WITH GFR  Spondylosis of lumbar region without myelopathy or radiculopathy: She has some chronic lower back pain  Fibromyalgia: Generalized pain and positive tender points persist. Need for regular exercise was discussed.  Primary insomnia: Good sleep hygiene was discussed.   fatigue: Related to insomnia.  Pain management: Chronic pain she does take some medications.  Osteopenia of multiple sites  Her other medical problems are listed as follows: History of depression  History of cholelithiasis  Family history of GERD  History of hypertension    Orders: Orders Placed This Encounter  Procedures  . Large Joint Injection/Arthrocentesis  . CBC with Differential/Platelet  . COMPLETE METABOLIC PANEL WITH GFR  . Uric acid   No orders of the defined types were placed in this encounter.   Face-to-face time spent with  patient was 30 minutes. 50% of time was spent in counseling and coordination of care.  Follow-Up Instructions: Return in about 6 months (around 10/29/2016) for Gout, osteoarthritis, fibromyalgia.   Bo Merino, MD  Note - This record has been created using Editor, commissioning.  Chart creation errors have been sought, but may not always  have been located. Such creation errors do not reflect on  the standard of medical care.

## 2016-04-27 DIAGNOSIS — H524 Presbyopia: Secondary | ICD-10-CM | POA: Diagnosis not present

## 2016-04-27 DIAGNOSIS — H5203 Hypermetropia, bilateral: Secondary | ICD-10-CM | POA: Diagnosis not present

## 2016-04-27 DIAGNOSIS — H52223 Regular astigmatism, bilateral: Secondary | ICD-10-CM | POA: Diagnosis not present

## 2016-04-28 ENCOUNTER — Ambulatory Visit (INDEPENDENT_AMBULATORY_CARE_PROVIDER_SITE_OTHER): Payer: PPO | Admitting: Rheumatology

## 2016-04-28 ENCOUNTER — Encounter: Payer: Self-pay | Admitting: Rheumatology

## 2016-04-28 VITALS — BP 138/80 | HR 92 | Resp 16 | Wt 192.0 lb

## 2016-04-28 DIAGNOSIS — Z5181 Encounter for therapeutic drug level monitoring: Secondary | ICD-10-CM

## 2016-04-28 DIAGNOSIS — M797 Fibromyalgia: Secondary | ICD-10-CM | POA: Diagnosis not present

## 2016-04-28 DIAGNOSIS — Z8659 Personal history of other mental and behavioral disorders: Secondary | ICD-10-CM | POA: Diagnosis not present

## 2016-04-28 DIAGNOSIS — M25561 Pain in right knee: Secondary | ICD-10-CM

## 2016-04-28 DIAGNOSIS — M8589 Other specified disorders of bone density and structure, multiple sites: Secondary | ICD-10-CM

## 2016-04-28 DIAGNOSIS — F5101 Primary insomnia: Secondary | ICD-10-CM

## 2016-04-28 DIAGNOSIS — Z8379 Family history of other diseases of the digestive system: Secondary | ICD-10-CM | POA: Diagnosis not present

## 2016-04-28 DIAGNOSIS — M47816 Spondylosis without myelopathy or radiculopathy, lumbar region: Secondary | ICD-10-CM | POA: Diagnosis not present

## 2016-04-28 DIAGNOSIS — M1A09X0 Idiopathic chronic gout, multiple sites, without tophus (tophi): Secondary | ICD-10-CM

## 2016-04-28 DIAGNOSIS — Z8719 Personal history of other diseases of the digestive system: Secondary | ICD-10-CM | POA: Diagnosis not present

## 2016-04-28 DIAGNOSIS — R5383 Other fatigue: Secondary | ICD-10-CM | POA: Diagnosis not present

## 2016-04-28 DIAGNOSIS — R52 Pain, unspecified: Secondary | ICD-10-CM | POA: Diagnosis not present

## 2016-04-28 DIAGNOSIS — Z8679 Personal history of other diseases of the circulatory system: Secondary | ICD-10-CM

## 2016-04-28 LAB — CBC WITH DIFFERENTIAL/PLATELET
BASOS ABS: 0 {cells}/uL (ref 0–200)
Basophils Relative: 0 %
EOS ABS: 186 {cells}/uL (ref 15–500)
EOS PCT: 3 %
HEMATOCRIT: 33.7 % — AB (ref 35.0–45.0)
HEMOGLOBIN: 10.6 g/dL — AB (ref 11.7–15.5)
LYMPHS ABS: 1860 {cells}/uL (ref 850–3900)
Lymphocytes Relative: 30 %
MCH: 28.3 pg (ref 27.0–33.0)
MCHC: 31.5 g/dL — ABNORMAL LOW (ref 32.0–36.0)
MCV: 89.9 fL (ref 80.0–100.0)
MPV: 9.2 fL (ref 7.5–12.5)
Monocytes Absolute: 620 cells/uL (ref 200–950)
Monocytes Relative: 10 %
NEUTROS PCT: 57 %
Neutro Abs: 3534 cells/uL (ref 1500–7800)
Platelets: 333 10*3/uL (ref 140–400)
RBC: 3.75 MIL/uL — ABNORMAL LOW (ref 3.80–5.10)
RDW: 14.7 % (ref 11.0–15.0)
WBC: 6.2 10*3/uL (ref 3.8–10.8)

## 2016-04-28 LAB — COMPLETE METABOLIC PANEL WITH GFR
ALBUMIN: 3.8 g/dL (ref 3.6–5.1)
ALK PHOS: 55 U/L (ref 33–130)
ALT: 44 U/L — ABNORMAL HIGH (ref 6–29)
AST: 45 U/L — ABNORMAL HIGH (ref 10–35)
BUN: 10 mg/dL (ref 7–25)
CALCIUM: 9.6 mg/dL (ref 8.6–10.4)
CO2: 30 mmol/L (ref 20–31)
Chloride: 105 mmol/L (ref 98–110)
Creat: 0.73 mg/dL (ref 0.50–0.99)
GFR, EST NON AFRICAN AMERICAN: 85 mL/min (ref >=60)
Glucose, Bld: 102 mg/dL — ABNORMAL HIGH (ref 65–99)
POTASSIUM: 3.8 mmol/L (ref 3.5–5.3)
SODIUM: 142 mmol/L (ref 135–146)
Total Bilirubin: 0.3 mg/dL (ref 0.2–1.2)
Total Protein: 6.7 g/dL (ref 6.1–8.1)

## 2016-04-28 MED ORDER — HYDROCODONE-ACETAMINOPHEN 5-325 MG PO TABS: 1.0000 | ORAL_TABLET | Freq: Every evening | ORAL | 0 refills | Status: DC | PRN

## 2016-04-28 MED ORDER — LIDOCAINE HCL 1 % IJ SOLN
1.5000 mL | INTRAMUSCULAR | Status: AC | PRN
  Administered 2016-04-28: 1.5 mL

## 2016-04-28 MED ORDER — TRIAMCINOLONE ACETONIDE 40 MG/ML IJ SUSP
40.0000 mg | INTRAMUSCULAR | Status: AC | PRN
  Administered 2016-04-28: 40 mg via INTRA_ARTICULAR

## 2016-04-28 NOTE — Addendum Note (Signed)
Addended byCandice Camp on: 04/28/2016 03:55 PM   Modules accepted: Orders

## 2016-04-29 LAB — URIC ACID: URIC ACID, SERUM: 3.7 mg/dL (ref 2.5–7.0)

## 2016-04-29 NOTE — Progress Notes (Signed)
Elevated LFTs rest normal. Should avoid Naprosyn

## 2016-05-17 DIAGNOSIS — D489 Neoplasm of uncertain behavior, unspecified: Secondary | ICD-10-CM | POA: Diagnosis not present

## 2016-05-20 ENCOUNTER — Encounter: Payer: Self-pay | Admitting: Internal Medicine

## 2016-06-07 ENCOUNTER — Other Ambulatory Visit: Payer: Self-pay | Admitting: Rheumatology

## 2016-06-07 MED ORDER — HYDROCODONE-ACETAMINOPHEN 5-325 MG PO TABS: 1.0000 | ORAL_TABLET | Freq: Every evening | ORAL | 0 refills | Status: DC | PRN

## 2016-06-07 NOTE — Telephone Encounter (Signed)
ok 

## 2016-06-07 NOTE — Telephone Encounter (Signed)
Patient left a message requesting a refill on her hydrocodone.  CB#[phone]

## 2016-06-07 NOTE — Telephone Encounter (Signed)
Last Visit: 04/28/16 Next Visit: 10/25/16 UDS: 02/15/16 Narc agreement: 04/28/16  Okay to refill Hydrocodone?

## 2016-06-15 ENCOUNTER — Other Ambulatory Visit: Payer: Self-pay | Admitting: Rheumatology

## 2016-06-15 NOTE — Telephone Encounter (Signed)
ok 

## 2016-06-15 NOTE — Telephone Encounter (Signed)
Last Visit: 04/28/16 Next Visit: 10/25/16  Okay to refill Gabapentin and Allopurinol?

## 2016-06-30 ENCOUNTER — Ambulatory Visit (INDEPENDENT_AMBULATORY_CARE_PROVIDER_SITE_OTHER): Payer: PPO | Admitting: Internal Medicine

## 2016-06-30 ENCOUNTER — Encounter (INDEPENDENT_AMBULATORY_CARE_PROVIDER_SITE_OTHER): Payer: Self-pay

## 2016-06-30 ENCOUNTER — Encounter: Payer: Self-pay | Admitting: Internal Medicine

## 2016-06-30 VITALS — BP 128/82 | HR 80 | Ht 65.0 in | Wt 187.0 lb

## 2016-06-30 DIAGNOSIS — K580 Irritable bowel syndrome with diarrhea: Secondary | ICD-10-CM

## 2016-06-30 DIAGNOSIS — Z800 Family history of malignant neoplasm of digestive organs: Secondary | ICD-10-CM | POA: Diagnosis not present

## 2016-06-30 DIAGNOSIS — Z86010 Personal history of colonic polyps: Secondary | ICD-10-CM

## 2016-06-30 NOTE — Progress Notes (Signed)
Tammy Mullins 65 y.o. 09-21-1950 [id number]  Assessment & Plan:   Encounter Diagnoses  Name Primary?  . History of adenomatous polyp of colon Yes  . Family history of colon cancer   . 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 Elderly brother w/ CTCA - dx late 60's Negative golonoscopy 2009 incl random bxs  Needs colonoscopy given appendiceal polyp hx  The risks and benefits as well as alternatives of endoscopic procedure(s) have been discussed and reviewed. All questions answered. The patient agrees to proceed.  She will try dicyclomine 20 mg q AM and see if that helps her IBS-D without constipating like tid did  Stop fish oil to see if that is causing some sxs  Keep in mind cholecystectomy may be playing a role and could have bile salt diarrhea  I appreciate the opportunity to care for this patient. QP:YPPJK,DTOIZT Tammy Coil, MD Tammy Ok, MD  Subjective:   Chief Complaint: polyp of appendix and diarrhea  HPI This is a very nice elderly AA woman w/ hx IBS -D - negative colonoscopy and random bxs 2009 - was found to have a dilated appendix last year and also symptomatic cholelithiasis and underwent appendectomy and cholecystectomy. Polyp in appendix - villous adenoma + cystadenoma. Has had yrs of urgent diarrhea - not incontinent - probably worse lately. The diarrhea mostly psot-prandial if out to eat has to stop for bathroom before making it home. Dicyclomine at 20 mg tid constipated her so she stopped it. Has cramps with infraumbilical location usually before defecation but at other times also.  Some heartburn but controlled on omeprazole All other GI ROS neg  Allergies  Allergen Reactions  . Crestor [Rosuvastatin Calcium] Other (See Comments)    Causes severe body aches  . Asa [Aspirin]     GI upset in hight doses can take a 81mg    Current Meds  Medication Sig  . allopurinol (ZYLOPRIM)  300 MG tablet TAKE 1 AND 1/2 TABLETS BY MOUTH DAILY  . amLODipine (NORVASC) 10 MG tablet TAKE 1 TABLET DAILY  . aspirin 81 MG tablet Take 81 mg by mouth daily.    . Calcium Carbonate-Vit D-Min (CALCIUM 1200) 1200-1000 MG-UNIT CHEW Chew 1 tablet by mouth daily.  . Cholecalciferol (VITAMIN D) 2000 UNITS CAPS Take 2,000 Units by mouth daily.   . Cyanocobalamin (VITAMIN B 12 PO) Take 100 mcg by mouth daily.  . cyclobenzaprine (FLEXERIL) 10 MG tablet TAKE 1 TABLET BY MOUTH AT BEDTIME AS NEEDED  . dicyclomine (BENTYL) 20 MG tablet Take 1 tablet (20 mg total) by mouth 3 (three) times daily as needed. (Patient taking differently: Take 20 mg by mouth 3 (three) times daily as needed for spasms. )  . DULoxetine (CYMBALTA) 60 MG capsule   . fish oil-omega-3 fatty acids 1000 MG capsule Take 1 g by mouth daily.   . fluticasone (FLONASE) 50 MCG/ACT nasal spray Place 2 sprays into both nostrils at bedtime.  . gabapentin (NEURONTIN) 300 MG capsule TAKE 1 CAPSULE BY MOUTH THREE TIMES DAILY  . hydrochlorothiazide (HYDRODIURIL) 25 MG tablet Take 1 tablet (25 mg total) by mouth daily.  Marland Kitchen HYDROcodone-acetaminophen (NORCO/VICODIN) 5-325 MG tablet Take 1 tablet by mouth at bedtime as needed for moderate pain.  Marland Kitchen losartan (COZAAR) 100 MG tablet Take 1 tablet (100 mg total) by mouth daily.  . methocarbamol (ROBAXIN) 500 MG tablet Take 500 mg by mouth 3 (three) times daily as needed for  muscle spasms. Take as directed by Dr. Estanislado Pandy.  . metoprolol succinate (TOPROL-XL) 100 MG 24 hr tablet TAKE 1 TABLET DAILY  . naproxen (NAPROSYN) 500 MG tablet Take 500 mg by mouth 2 (two) times daily.  Marland Kitchen omeprazole (PRILOSEC) 20 MG capsule TAKE 1 CAPSULE EVERY DAY  . QUEtiapine (SEROQUEL) 300 MG tablet Take 600 mg by mouth at bedtime.    . raloxifene (EVISTA) 60 MG tablet Take 60 mg by mouth daily.    Tammy Mullins Past Surgical History:  Procedure Laterality Date  . ANKLE SURGERY Right   . CARPAL TUNNEL RELEASE Right   . CERVICAL SPINE  SURGERY     plates and pins  . CHOLECYSTECTOMY N/A 06/26/2015   Procedure: LAPAROSCOPIC CHOLECYSTECTOMY;  Surgeon: Tammy Ok, MD;  Location: [place];  Service: General;  Laterality: N/A;  . COLONOSCOPY    . ESOPHAGOGASTRODUODENOSCOPY    . LAPAROSCOPIC APPENDECTOMY N/A 06/26/2015   Procedure: LAPAROSCOPIC APPENDECTOMY;  Surgeon: Tammy Ok, MD;  Location: [place];  Service: General;  Laterality: N/A;  . ROTATOR CUFF REPAIR Right   . SALPINGOOPHORECTOMY  Left   ectopic 1980   Social History   Social History  . Marital status: Widowed    Spouse name: N/A  . Number of children: 0  . Years of education: N/A   Occupational History  . retired    Social History Main Topics  . Smoking status: Never Smoker  . Smokeless tobacco: Never Used  . Alcohol use No  . Drug use: No  . Sexual activity: No   Other Topics Concern  . 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  [place]         family history includes Cancer in her maternal grandmother; Cirrhosis in her father; Colon cancer (age of onset: 62) in her brother; Glaucoma in her brother and mother; Heart disease in her sister; Lung disease in her mother.   Review of Systems As above, osteoarthritis and gout problems - right hand especially - sees Dr. Estanislado Pandy Also back pain, allergy sxs depressed mood and rash All other neg  Objective:   Physical Exam @BP  128/82 (BP Location: Left Arm, Patient Position: Sitting, Cuff Size: Normal)   Pulse 80   Ht 5\' 5"  (1.651 m) Comment: height measured without shoes  Wt 187 lb (84.8 kg)   BMI 31.12 kg/m @  General:  Well-developed, well-nourished and in no acute distress Eyes:  anicteric. ENT:   Mouth and  posterior pharynx free of lesions.  Neck:   supple w/o thyromegaly or mass.  Lungs: Clear to auscultation bilaterally. Heart:  S1S2, no rubs, murmurs, gallops. Abdomen:  soft,  Mildly tender infraumbilical, no hepatosplenomegaly, hernia, or mass and BS+.  Lymph:  no cervical or supraclavicular adenopathy. Extremities:   no edema, cyanosis or clubbing Skin   no rash. Neuro:  A&O x 3.  Psych:  appropriate mood and  Affect.   Data Reviewed: As per HPI Also surgical op notes, pathology and Dr Rosendo Gros 06/26/16 office note

## 2016-06-30 NOTE — Patient Instructions (Signed)
You have been scheduled for a colonoscopy. Please follow written instructions given to you at your visit today.  Please pick up your prep supplies at the pharmacy. If you use inhalers (even only as needed), please bring them with you on the day of your procedure. Your physician has requested that you go to www.startemmi.com and enter the access code given to you at your visit today. This web site gives a general overview about your procedure. However, you should still follow specific instructions given to you by our office regarding your preparation for the procedure.   Stop your Fish Oil per Dr Carlean Purl.   Decrease your dicyclomine to once daily per Dr. Carlean Purl.   I appreciate the opportunity to care for you. Silvano Rusk, MD, [facility]

## 2016-07-01 ENCOUNTER — Encounter: Payer: Self-pay | Admitting: Internal Medicine

## 2016-07-13 ENCOUNTER — Encounter: Payer: Self-pay | Admitting: Gynecology

## 2016-07-18 DIAGNOSIS — M25511 Pain in right shoulder: Secondary | ICD-10-CM | POA: Diagnosis not present

## 2016-07-18 DIAGNOSIS — Z6830 Body mass index (BMI) 30.0-30.9, adult: Secondary | ICD-10-CM | POA: Diagnosis not present

## 2016-07-18 DIAGNOSIS — K589 Irritable bowel syndrome without diarrhea: Secondary | ICD-10-CM | POA: Diagnosis not present

## 2016-07-18 DIAGNOSIS — K219 Gastro-esophageal reflux disease without esophagitis: Secondary | ICD-10-CM | POA: Diagnosis not present

## 2016-07-18 DIAGNOSIS — I10 Essential (primary) hypertension: Secondary | ICD-10-CM | POA: Diagnosis not present

## 2016-07-18 DIAGNOSIS — R7303 Prediabetes: Secondary | ICD-10-CM | POA: Diagnosis not present

## 2016-07-19 ENCOUNTER — Encounter: Payer: Self-pay | Admitting: Internal Medicine

## 2016-08-01 ENCOUNTER — Telehealth: Payer: Self-pay | Admitting: Internal Medicine

## 2016-08-01 NOTE — Telephone Encounter (Signed)
I explained to the patient that she will purchase the Miralax and Dulcolax over the counter.  She has a copy of her instructions and will go to the pharmacy to pick up what she needs for the prep tonight.

## 2016-08-02 ENCOUNTER — Encounter: Payer: Self-pay | Admitting: Internal Medicine

## 2016-08-02 ENCOUNTER — Ambulatory Visit (AMBULATORY_SURGERY_CENTER): Payer: PPO | Admitting: Internal Medicine

## 2016-08-02 VITALS — BP 148/84 | HR 78 | Temp 96.9°F | Resp 18 | Ht 65.0 in | Wt 187.0 lb

## 2016-08-02 DIAGNOSIS — D120 Benign neoplasm of cecum: Secondary | ICD-10-CM

## 2016-08-02 DIAGNOSIS — Z86010 Personal history of colonic polyps: Secondary | ICD-10-CM

## 2016-08-02 DIAGNOSIS — K529 Noninfective gastroenteritis and colitis, unspecified: Secondary | ICD-10-CM | POA: Diagnosis not present

## 2016-08-02 DIAGNOSIS — K633 Ulcer of intestine: Secondary | ICD-10-CM

## 2016-08-02 DIAGNOSIS — Z800 Family history of malignant neoplasm of digestive organs: Secondary | ICD-10-CM | POA: Diagnosis not present

## 2016-08-02 MED ORDER — SODIUM CHLORIDE 0.9 % IV SOLN: 500.0000 mL | INTRAVENOUS | Status: DC

## 2016-08-02 NOTE — Op Note (Signed)
[facility] Patient Name: Tammy Mullins Procedure Date: 08/02/2016 4:19 PM MRN: [id number] Endoscopist: Gatha Mayer , MD Age: 66 Referring MD:  Date of Birth: 10-Jun-1950 Gender: Female Account #: [id number] Procedure:                Colonoscopy Indications:              High risk colon cancer surveillance: Personal                            history of adenoma with villous component Medicines:                Propofol per Anesthesia, Monitored Anesthesia Care Procedure:                Pre-Anesthesia Assessment:                           - Prior to the procedure, a History and Physical                            was performed, and patient medications and                            allergies were reviewed. The patient's tolerance of                            previous anesthesia was also reviewed. The risks                            and benefits of the procedure and the sedation                            options and risks were discussed with the patient.                            All questions were answered, and informed consent                            was obtained. Prior Anticoagulants: The patient has                            taken no previous anticoagulant or antiplatelet                            agents. ASA Grade Assessment: II - A patient with                            mild systemic disease. After reviewing the risks                            and benefits, the patient was deemed in                            satisfactory condition to undergo the procedure.  After obtaining informed consent, the colonoscope                            was passed under direct vision. Throughout the                            procedure, the patient's blood pressure, pulse, and                            oxygen saturations were monitored continuously. The                            Colonoscope was introduced through the anus and    advanced to the the cecum, identified by                            appendiceal orifice and ileocecal valve. The                            colonoscopy was performed without difficulty. The                            patient tolerated the procedure well. The quality                            of the bowel preparation was excellent. The                            ileocecal valve, appendiceal orifice, and rectum                            were photographed. The bowel preparation used was                            Miralax. Scope In: 4:32:30 PM Scope Out: 4:48:15 PM Scope Withdrawal Time: 0 hours 13 minutes 0 seconds  Total Procedure Duration: 0 hours 15 minutes 45 seconds  Findings:                 The perianal and digital rectal examinations were                            normal.                           A single (solitary) ten mm ulcer was found at the                            ileocecal valve. No bleeding was present. Biopsies                            were taken with a cold forceps for histology.                            Verification of patient identification for the  specimen was done. Estimated blood loss was minimal.                           The exam was otherwise without abnormality on                            direct and retroflexion views. Complications:            No immediate complications. Estimated Blood Loss:     Estimated blood loss was minimal. Impression:               - A single (solitary) ulcer at the ileocecal valve.                            IC valve enlarged, soft and erythematous -                            submucosal heme. Unable to open up valve and enter                            ileum. Biopsied.                           - The examination was otherwise normal on direct                            and retroflexion views. Recommendation:           - Patient has a contact number available for                             emergencies. The signs and symptoms of potential                            delayed complications were discussed with the                            patient. Return to normal activities tomorrow.                            Written discharge instructions were provided to the                            patient.                           - Resume previous diet.                           - Continue present medications.                           - Await pathology results.                           - Repeat colonoscopy is recommended. The  colonoscopy date will be determined after pathology                            results from today's exam become available for                            review. Gatha Mayer, MD 08/02/2016 4:56:47 PM This report has been signed electronically.

## 2016-08-02 NOTE — Progress Notes (Signed)
vss alert and oriented x3. Pt. Satisfied with mac.report to [employer]

## 2016-08-02 NOTE — Progress Notes (Signed)
Called to room to assist during endoscopic procedure.  Patient ID and intended procedure confirmed with present staff. Received instructions for my participation in the procedure from the performing physician.  

## 2016-08-02 NOTE — Patient Instructions (Addendum)
There was a small ulcer on an area in the colon. I took biopsies. Looks like inflammation. Will call with results and recommendations.  No polyps.  I appreciate the opportunity to care for you. Gatha Mayer, MD, FACG YOU HAD AN ENDOSCOPIC PROCEDURE TODAY AT [facility]:   Refer to the procedure report that was given to you for any specific questions about what was found during the examination.  If the procedure report does not answer your questions, please call your gastroenterologist to clarify.  If you requested that your care partner not be given the details of your procedure findings, then the procedure report has been included in a sealed envelope for you to review at your convenience later.  YOU SHOULD EXPECT: Some feelings of bloating in the abdomen. Passage of more gas than usual.  Walking can help get rid of the air that was put into your GI tract during the procedure and reduce the bloating. If you had a lower endoscopy (such as a colonoscopy or flexible sigmoidoscopy) you may notice spotting of blood in your stool or on the toilet paper. If you underwent a bowel prep for your procedure, you may not have a normal bowel movement for a few days.  Please Note:  You might notice some irritation and congestion in your nose or some drainage.  This is from the oxygen used during your procedure.  There is no need for concern and it should clear up in a day or so.  SYMPTOMS TO REPORT IMMEDIATELY:   Following lower endoscopy (colonoscopy or flexible sigmoidoscopy):  Excessive amounts of blood in the stool  Significant tenderness or worsening of abdominal pains  Swelling of the abdomen that is new, acute  Fever of 100F or higher  For urgent or emergent issues, a gastroenterologist can be reached at any hour by calling [phone].   DIET:  We do recommend a small meal at first, but then you may proceed to your regular diet.  Drink plenty of fluids but you  should avoid alcoholic beverages for 24 hours.  MEDICATIONS:  Continue present medications.  ACTIVITY:  You should plan to take it easy for the rest of today and you should NOT DRIVE or use heavy machinery until tomorrow (because of the sedation medicines used during the test).    FOLLOW UP: Our staff will call the number listed on your records the next business day following your procedure to check on you and address any questions or concerns that you may have regarding the information given to you following your procedure. If we do not reach you, we will leave a message.  However, if you are feeling well and you are not experiencing any problems, there is no need to return our call.  We will assume that you have returned to your regular daily activities without incident.  If any biopsies were taken you will be contacted by phone or by letter within the next 1-3 weeks.  Please call us at [phone] if you have not heard about the biopsies in 3 weeks.   Thank you for allowing [place] to provide for your healthcare needs today.   SIGNATURES/CONFIDENTIALITY: You and/or your care partner have signed paperwork which will be entered into your electronic medical record.  These signatures attest to the fact that that the information above on your After Visit Summary has been reviewed and is understood.  Full responsibility of the confidentiality of this discharge information lies with you  and/or your care-partner. 

## 2016-08-03 ENCOUNTER — Telehealth: Payer: Self-pay | Admitting: *Deleted

## 2016-08-03 NOTE — Telephone Encounter (Signed)
  Follow up Call-  Call back number 08/02/2016  Post procedure Call Back phone  # [phone]  Permission to leave phone message Yes  Some recent data might be hidden     Patient questions:  Do you have a fever, pain , or abdominal swelling? No. Pain Score  0 *  Have you tolerated food without any problems? Yes.    Have you been able to return to your normal activities? Yes.    Do you have any questions about your discharge instructions: Diet   No. Medications  No. Follow up visit  No.  Do you have questions or concerns about your Care? No.  Actions: * If pain score is 4 or above: No action needed, pain <4.

## 2016-08-04 ENCOUNTER — Other Ambulatory Visit: Payer: Self-pay | Admitting: Rheumatology

## 2016-08-04 MED ORDER — HYDROCODONE-ACETAMINOPHEN 5-325 MG PO TABS: 1.0000 | ORAL_TABLET | Freq: Every evening | ORAL | 0 refills | Status: DC | PRN

## 2016-08-04 NOTE — Telephone Encounter (Signed)
Last Visit: 04/28/16 Next Visit: 10/25/16 UDS: 02/15/16 Narc agreement: 04/28/16  Okay to refill Hydrocodone?

## 2016-08-04 NOTE — Telephone Encounter (Signed)
Patient called requesting a refill on her Hydrocodone.  CB#[phone].  Thank you.

## 2016-08-04 NOTE — Telephone Encounter (Signed)
ok 

## 2016-08-10 ENCOUNTER — Encounter: Payer: Self-pay | Admitting: Internal Medicine

## 2016-08-10 DIAGNOSIS — K633 Ulcer of intestine: Secondary | ICD-10-CM

## 2016-08-10 HISTORY — DX: Ulcer of intestine: K63.3

## 2016-08-10 NOTE — Progress Notes (Signed)
Benign ulcer Cause not clear Repeat colonoscopy 6 mos

## 2016-08-23 DIAGNOSIS — F331 Major depressive disorder, recurrent, moderate: Secondary | ICD-10-CM | POA: Diagnosis not present

## 2016-09-06 ENCOUNTER — Other Ambulatory Visit: Payer: Self-pay | Admitting: Rheumatology

## 2016-09-06 NOTE — Telephone Encounter (Signed)
ok 

## 2016-09-06 NOTE — Telephone Encounter (Signed)
Last Visit: 04/28/16 Next Visit: 10/25/16 UDS: 02/15/16 Narc agreement: 04/28/16  Okay to refill Hydrocodone?

## 2016-09-06 NOTE — Telephone Encounter (Signed)
Patient requesting a refill on her Hydrocodone. Please call when ready up pick up.

## 2016-09-07 MED ORDER — HYDROCODONE-ACETAMINOPHEN 5-325 MG PO TABS: 1.0000 | ORAL_TABLET | Freq: Every evening | ORAL | 0 refills | Status: DC | PRN

## 2016-09-16 ENCOUNTER — Other Ambulatory Visit: Payer: Self-pay | Admitting: Gynecology

## 2016-09-16 DIAGNOSIS — Z1231 Encounter for screening mammogram for malignant neoplasm of breast: Secondary | ICD-10-CM

## 2016-09-21 ENCOUNTER — Other Ambulatory Visit: Payer: Self-pay | Admitting: Rheumatology

## 2016-09-22 NOTE — Telephone Encounter (Signed)
Last Visit: 04/28/16 Next Visit: 10/25/16  Okay to refill per Dr. Estanislado Pandy

## 2016-09-26 ENCOUNTER — Ambulatory Visit
Admission: RE | Admit: 2016-09-26 | Discharge: 2016-09-26 | Disposition: A | Discharge status: Home / Self Care | Payer: PPO | Source: Ambulatory Visit | Attending: Gynecology | Admitting: Gynecology

## 2016-09-26 DIAGNOSIS — Z1231 Encounter for screening mammogram for malignant neoplasm of breast: Secondary | ICD-10-CM | POA: Diagnosis not present

## 2016-10-04 ENCOUNTER — Other Ambulatory Visit: Payer: Self-pay | Admitting: Rheumatology

## 2016-10-04 DIAGNOSIS — Z5181 Encounter for therapeutic drug level monitoring: Secondary | ICD-10-CM

## 2016-10-04 NOTE — Telephone Encounter (Signed)
Patient will come to office on 10/05/16 to have UDS done.

## 2016-10-04 NOTE — Telephone Encounter (Signed)
Patient is not taking  Oxycodone. Patient is taking Hydrocodone. Patient is requesting refill for Hydrocodone.   Last Visit: 04/28/16 Next Visit: 10/25/16 UDS: 02/15/16 Narc agreement: 04/28/16 Last Fill: 09/07/16  Okay to refill Hydrocodone?

## 2016-10-04 NOTE — Telephone Encounter (Signed)
Patient request a refill on Oxycodone. Please call when ready to pick up.

## 2016-10-04 NOTE — Telephone Encounter (Signed)
Ok after UDS. It was due in June.

## 2016-10-20 NOTE — Progress Notes (Deleted)
Office Visit Note  Patient: Tammy Mullins             Date of Birth: Nov 11, 1950           MRN: [id number]             PCP: Maury Dus, MD Referring: Maury Dus, MD Visit Date: 10/25/2016 Occupation: @GUAROCC @    Subjective:  No chief complaint on file.   History of Present Illness: Tammy Mullins is a 66 y.o. female ***   Activities of Daily Living:  Patient reports morning stiffness for *** {minute/hour:19697}.   Patient {ACTIONS;DENIES/REPORTS:21021675::"Denies"} nocturnal pain.  Difficulty dressing/grooming: {ACTIONS;DENIES/REPORTS:21021675::"Denies"} Difficulty climbing stairs: {ACTIONS;DENIES/REPORTS:21021675::"Denies"} Difficulty getting out of chair: {ACTIONS;DENIES/REPORTS:21021675::"Denies"} Difficulty using hands for taps, buttons, cutlery, and/or writing: {ACTIONS;DENIES/REPORTS:21021675::"Denies"}   No Rheumatology ROS completed.   PMFS History:  Patient Active Problem List   Diagnosis Date Noted  . 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 History of gastroesophageal reflux (GERD) 04/19/2016  . History of hypertension 04/19/2016  . Other fatigue 03/21/2016  . Primary insomnia 03/21/2016  . History of gout 03/21/2016  . Osteoarthritis of lumbar spine 03/21/2016  . Dyslipidemia 03/21/2016  . History of IBS 03/21/2016  . History of depression 03/21/2016  . History of cholelithiasis 03/21/2016  . Pain management 03/21/2016  . Diabetes type 2, controlled ([place]) 01/09/2014  . Gout 09/20/2013  . OSA (obstructive sleep apnea) 09/16/2013  . Sleep disturbance 06/27/2013  . Chest pain, unspecified 06/06/2012  . Vaginal atrophy 03/15/2012  . Osteopenia 03/15/2012  . Post-menopausal 03/15/2012  . Vitamin D deficiency 11/04/2010  . Depression 10/20/2007  . CHRONIC PAIN SYNDROME 10/20/2007  . ALLERGIC RHINITIS 10/20/2007  . HYPERCHOLESTEROLEMIA 10/03/2007  . MITRAL VALVE PROLAPSE, HX OF 10/03/2007  . ANXIETY 04/20/2007  . ATTENTION DEFICIT DISORDER  04/20/2007  . Essential hypertension 04/20/2007  . GERD 04/20/2007  . IRRITABLE BOWEL SYNDROME 04/20/2007  . Primary osteoarthritis of both hands 04/20/2007  . DE QUERVAIN'S TENOSYNOVITIS 04/20/2007  . Fibromyalgia 04/20/2007  . DIVERTICULOSIS, COLON 01/09/2001    Past Medical History:  Diagnosis Date  . Allergic rhinitis, cause unspecified   . Anxiety state, unspecified   . Arthritis   . Attention deficit disorder without mention of hyperactivity   . Barrett's esophagus   . Chronic pain syndrome   . 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 Depression   . Diabetes type 2, controlled ([place]) 01/09/2014  . Diverticulosis of colon (without mention of hemorrhage)   . Esophageal reflux   . Fibromyalgia   . Gout, unspecified   . Hiatal hernia   . Irritable bowel syndrome   . 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 Myalgia and myositis, unspecified   . Osteoarthrosis, unspecified whether generalized or localized, unspecified site   . Personal history of unspecified circulatory disease   . Pure hypercholesterolemia   . Radial styloid tenosynovitis   . Unspecified essential hypertension   . Unspecified nonpsychotic mental disorder   . Unspecified pruritic disorder     Family History  Problem Relation Age of Onset  . Cirrhosis Father   . Cancer Maternal Grandmother        ?  . Lung disease Mother        ?   . Glaucoma Mother   . Glaucoma Brother   . Prostate cancer Brother   . Heart disease Sister   . Kidney disease Neg Hx   . Diabetes Neg Hx    Past Surgical History:  Procedure  Laterality Date  . ANKLE SURGERY Right   . CARPAL TUNNEL RELEASE Right   . CERVICAL SPINE SURGERY     plates and pins  . CHOLECYSTECTOMY N/A 06/26/2015   Procedure: LAPAROSCOPIC CHOLECYSTECTOMY;  Surgeon: Ralene Ok, MD;  Location: [facility];  Service: General;  Laterality: N/A;  . COLONOSCOPY    . ESOPHAGOGASTRODUODENOSCOPY    . LAPAROSCOPIC APPENDECTOMY N/A 06/26/2015   Procedure:  LAPAROSCOPIC APPENDECTOMY;  Surgeon: Ralene Ok, MD;  Location: [facility];  Service: General;  Laterality: N/A;  . ROTATOR CUFF REPAIR Right   . 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  [place]           Objective: Vital Signs: There were no vitals taken for this visit.   Physical Exam   Musculoskeletal Exam: ***  CDAI Exam: No CDAI exam completed.    Investigation: Findings:  02/15/2016 UDS ; narcotic agreement 04/28/2016  CBC Latest Ref Rng & Units 04/28/2016 06/19/2015 05/14/2015  WBC 3.8 - 10.8 K/uL 6.2 14.1(H) 7.3  Hemoglobin 11.7 - 15.5 g/dL 10.6(L) 11.8(L) 12.1  Hematocrit 35.0 - 45.0 % 33.7(L) 36.3 36.9  Platelets 140 - 400 K/uL 333 294 280   CMP     Component Value Date/Time   NA 142 04/28/2016 1526   K 3.8 04/28/2016 1526   CL 105 04/28/2016 1526   CO2 30 04/28/2016 1526   GLUCOSE 102 (H) 04/28/2016 1526   BUN 10 04/28/2016 1526   CREATININE 0.73 04/28/2016 1526   CALCIUM 9.6 04/28/2016 1526   PROT 6.7 04/28/2016 1526   ALBUMIN 3.8 04/28/2016 1526   AST 45 (H) 04/28/2016 1526   ALT 44 (H) 04/28/2016 1526   ALKPHOS 55 04/28/2016 1526   BILITOT 0.3 04/28/2016 1526   GFRNONAA 85 04/28/2016 1526   GFRAA >89 04/28/2016 1526   Uric acid 3.7  Imaging: Mm Screening Breast Tomo Bilateral  Result Date: 09/27/2016 CLINICAL DATA:  Screening. EXAM: 2D DIGITAL SCREENING BILATERAL MAMMOGRAM WITH CAD AND ADJUNCT TOMO COMPARISON:  Previous exam(s). ACR Breast Density Category b: There are scattered areas of fibroglandular density. FINDINGS: There are no findings suspicious for malignancy. Images were processed with CAD. IMPRESSION: No  mammographic evidence of malignancy. A result letter of this screening mammogram will be mailed directly to the patient. RECOMMENDATION: Screening mammogram in one year. (Code:SM-B-01Y) BI-RADS CATEGORY  1: Negative. Electronically Signed   By: Fidela Salisbury M.D.   On: 09/27/2016 10:44    Speciality Comments: No specialty comments available.    Procedures:  No procedures performed Allergies: Crestor [rosuvastatin calcium] and Asa [aspirin]   Assessment / Plan:     Visit Diagnoses: Idiopathic chronic gout of multiple sites without tophus - Allopurinol 450 mg by mouth daily  Fibromyalgia  Primary insomnia  Other fatigue  History of depression  History of gastroesophageal reflux (GERD)  History of IBS  History of hypertension  History of sleep apnea  History of hyperlipidemia    Orders: No orders of the defined types were placed in this encounter.  No orders of the defined types were placed in this encounter.   Face-to-face time spent  with patient was *** minutes. 50% of time was spent in counseling and coordination of care.  Follow-Up Instructions: No Follow-up on file.   Bo Merino, MD  Note - This record has been created using Editor, commissioning.  Chart creation errors have been sought, but may not always  have been located. Such creation errors do not reflect on  the standard of medical care.

## 2016-10-25 ENCOUNTER — Ambulatory Visit: Payer: PPO | Admitting: Rheumatology

## 2016-11-25 ENCOUNTER — Other Ambulatory Visit: Payer: Self-pay | Admitting: Rheumatology

## 2016-11-25 NOTE — Telephone Encounter (Addendum)
Last Visit: 04/28/16 Next visit :11/30/16 Labs: 04/28/16 Elevated LFTs rest normal AST 45, ALT 44 previously normal  Okay to refill per Dr. Estanislado Pandy

## 2016-11-28 ENCOUNTER — Other Ambulatory Visit: Payer: Self-pay | Admitting: Rheumatology

## 2016-11-29 NOTE — Progress Notes (Signed)
Office Visit Note  Patient: Tammy Mullins             Date of Birth: May 07, 1950           MRN: [id number]             PCP: Maury Dus, MD Referring: Maury Dus, MD Visit Date: 11/30/2016 Occupation: @GUAROCC @    Subjective:  Osteoarthritis (Right knee pain, swollen, )   History of Present Illness: Tammy Mullins is a 66 y.o. female who was last seen in our office on 04/28/2016 for fibromyalgia, OA, gout. On the March 2018 visit, patient complained of right knee pain. Symptoms included pain, swelling over 2 week period prior to visit. She received cortisone in August 2017to the right knee by Dr. Durward Fortes with good response.due to limping and pain and trouble walking, Dr. Estanislado Pandy gave her cortisone injection to the right knee on 04/28/2016 visit  Patient was also flaring from her gout (complaining of pain and swelling in her right hand).  Today, doing ok w/ fms Right knee did better after dr Estanislado Pandy gave her  The last vist. However, last week she started hurting again and there is a little bit of swelling. Patient is using lidocaine ointment nd is giving her some relief. It is 5% lidocaine ointment 3 times a day with good relief.patient rates her pain in the right knee aout 5 on a scale of 0-10. It is not bad enough to get an injection. Patient's gout is also doing well in her right hand. No flare. She is complaining about right shoulder is hurting some.   Activities of Daily Living:  Patient reports morning stiffness for 1 hours.   Patient Denies nocturnal pain.  Difficulty dressing/grooming: Denies Difficulty climbing stairs: Reports Difficulty getting out of chair: Reports Difficulty using hands for taps, buttons, cutlery, and/or writing: Reports   Review of Systems  Constitutional: Positive for fatigue.  HENT: Negative for mouth sores and mouth dryness.   Eyes: Negative for dryness.  Respiratory: Negative for shortness of breath.   Gastrointestinal: Negative  for constipation and diarrhea.  Musculoskeletal: Positive for myalgias and myalgias.  Skin: Negative for sensitivity to sunlight.  Psychiatric/Behavioral: Positive for sleep disturbance. Negative for decreased concentration.    PMFS History:  Patient Active Problem List   Diagnosis Date Noted  . 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 History of gastroesophageal reflux (GERD) 04/19/2016  . History of hypertension 04/19/2016  . Other fatigue 03/21/2016  . Primary insomnia 03/21/2016  . History of gout 03/21/2016  . Osteoarthritis of lumbar spine 03/21/2016  . Dyslipidemia 03/21/2016  . History of IBS 03/21/2016  . History of depression 03/21/2016  . History of cholelithiasis 03/21/2016  . Pain management 03/21/2016  . Diabetes type 2, controlled ([place]) 01/09/2014  . Gout 09/20/2013  . OSA (obstructive sleep apnea) 09/16/2013  . Sleep disturbance 06/27/2013  . Chest pain, unspecified 06/06/2012  . Vaginal atrophy 03/15/2012  . Osteopenia 03/15/2012  . Post-menopausal 03/15/2012  . Vitamin D deficiency 11/04/2010  . Depression 10/20/2007  . CHRONIC PAIN SYNDROME 10/20/2007  . ALLERGIC RHINITIS 10/20/2007  . HYPERCHOLESTEROLEMIA 10/03/2007  . MITRAL VALVE PROLAPSE, HX OF 10/03/2007  . ANXIETY 04/20/2007  . ATTENTION DEFICIT DISORDER 04/20/2007  . Essential hypertension 04/20/2007  . GERD 04/20/2007  . IRRITABLE BOWEL SYNDROME 04/20/2007  . Primary osteoarthritis of both hands 04/20/2007  . DE QUERVAIN'S TENOSYNOVITIS 04/20/2007  . Fibromyalgia 04/20/2007  . DIVERTICULOSIS, COLON 01/09/2001    Past Medical History:  Diagnosis Date  . Allergic rhinitis, cause unspecified   . Anxiety state, unspecified   . Arthritis   . Attention deficit disorder without mention of hyperactivity   . Barrett's esophagus   . Chronic pain syndrome   . 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 Depression   . Diabetes type 2, controlled ([place]) 01/09/2014  . Diverticulosis of colon (without mention of  hemorrhage)   . Esophageal reflux   . Fibromyalgia   . Gout, unspecified   . Hiatal hernia   . Irritable bowel syndrome   . 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 Myalgia and myositis, unspecified   . Osteoarthrosis, unspecified whether generalized or localized, unspecified site   . Personal history of unspecified circulatory disease   . Pure hypercholesterolemia   . Radial styloid tenosynovitis   . Unspecified essential hypertension   . Unspecified nonpsychotic mental disorder   . Unspecified pruritic disorder     Family History  Problem Relation Age of Onset  . Cirrhosis Father   . Cancer Maternal Grandmother        ?  . Lung disease Mother        ?   . Glaucoma Mother   . Glaucoma Brother   . Prostate cancer Brother   . Heart disease Sister   . Kidney disease Neg Hx   . Diabetes Neg Hx    Past Surgical History:  Procedure Laterality Date  . ANKLE SURGERY Right   . APPENDECTOMY    . CARPAL TUNNEL RELEASE Right   . CERVICAL SPINE SURGERY     plates and pins  . CHOLECYSTECTOMY N/A 06/26/2015   Procedure: LAPAROSCOPIC CHOLECYSTECTOMY;  Surgeon: Ralene Ok, MD;  Location: [facility];  Service: General;  Laterality: N/A;  . COLONOSCOPY    . ESOPHAGOGASTRODUODENOSCOPY    . LAPAROSCOPIC APPENDECTOMY N/A 06/26/2015   Procedure: LAPAROSCOPIC APPENDECTOMY;  Surgeon: Ralene Ok, MD;  Location: [facility];  Service: General;  Laterality: N/A;  . ROTATOR CUFF REPAIR Right   . 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  [place]           Objective: Vital Signs: BP (!) 180/81 (BP Location: Left Arm, Patient Position: Sitting, Cuff  Size: Normal)   Pulse (!) 114   Resp 18   Ht 5' 5.5" (1.664 m)   Wt 190 lb (86.2 kg)   BMI 31.14 kg/m    Physical Exam  Constitutional: She is oriented to person, place, and time. She appears well-developed and well-nourished.  HENT:  Head: Normocephalic and atraumatic.  Eyes: Pupils are equal, round, and reactive to light. EOM are normal.  Cardiovascular: Normal rate, regular rhythm and normal heart sounds.  Exam reveals no gallop and no friction rub.   No murmur heard. Pulmonary/Chest: Effort normal and breath sounds normal. She has no wheezes. She has no rales.  Abdominal: Soft. Bowel sounds are normal. She exhibits no distension. There is no tenderness. There is no guarding. No hernia.  Musculoskeletal: Normal range of motion. She exhibits no edema, tenderness or deformity.  Lymphadenopathy:    She has no  cervical adenopathy.  Neurological: She is alert and oriented to person, place, and time. Coordination normal.  Skin: Skin is warm and dry. Capillary refill takes less than 2 seconds. No rash noted.  Psychiatric: She has a normal mood and affect. Her behavior is normal.  Nursing note and vitals reviewed.    Musculoskeletal Exam:  On the last visit, she had good range of motion of her C-spine T-spine and L-spine. Also, her shoulder joints wrist joints elbow joints MCPs PIPs and DIPs have good range of motion with no synovitis except some DIP PIP thickening consistent with osteoarthritis was noted at the last visit. Dr. Estanislado Pandy noticed that her hip joints also had good range of motion. Also, ankle joints MCPs PIPs had good range of motion with no swelling  CDAI Exam: CDAI Homunculus Exam:   Joint Counts:  CDAI Tender Joint count: 0 CDAI Swollen Joint count: 0     Investigation: No additional findings.   Patient had urine drug screen May 2017:==> Significant for benzodiazepine and oxycodone; narcotic agreement on file dated March 2017.  No visits with results within  6 Month(s) from this visit.  Latest known visit with results is:  Office Visit on 04/28/2016  Component Date Value Ref Range Status  . WBC 04/28/2016 6.2  3.8 - 10.8 K/uL Final  . RBC 04/28/2016 3.75* 3.80 - 5.10 MIL/uL Final  . Hemoglobin 04/28/2016 10.6* 11.7 - 15.5 g/dL Final  . HCT 04/28/2016 33.7* 35.0 - 45.0 % Final  . MCV 04/28/2016 89.9  80.0 - 100.0 fL Final  . MCH 04/28/2016 28.3  27.0 - 33.0 pg Final  . MCHC 04/28/2016 31.5* 32.0 - 36.0 g/dL Final  . RDW 04/28/2016 14.7  11.0 - 15.0 % Final  . Platelets 04/28/2016 333  140 - 400 K/uL Final  . MPV 04/28/2016 9.2  7.5 - 12.5 fL Final  . Neutro Abs 04/28/2016 3534  1,500 - 7,800 cells/uL Final  . Lymphs Abs 04/28/2016 1860  850 - 3,900 cells/uL Final  . Monocytes Absolute 04/28/2016 620  200 - 950 cells/uL Final  . Eosinophils Absolute 04/28/2016 186  15 - 500 cells/uL Final  . Basophils Absolute 04/28/2016 0  0 - 200 cells/uL Final  . Neutrophils Relative % 04/28/2016 57  % Final  . Lymphocytes Relative 04/28/2016 30  % Final  . Monocytes Relative 04/28/2016 10  % Final  . Eosinophils Relative 04/28/2016 3  % Final  . Basophils Relative 04/28/2016 0  % Final  . Smear Review 04/28/2016 Criteria for review not met   Final  . Sodium 04/28/2016 142  135 - 146 mmol/L Final  . Potassium 04/28/2016 3.8  3.5 - 5.3 mmol/L Final  . Chloride 04/28/2016 105  98 - 110 mmol/L Final  . CO2 04/28/2016 30  20 - 31 mmol/L Final  . Glucose, Bld 04/28/2016 102* 65 - 99 mg/dL Final  . BUN 04/28/2016 10  7 - 25 mg/dL Final  . Creat 04/28/2016 0.73  0.50 - 0.99 mg/dL Final   Comment:   For patients > or = 68 years of age: The upper reference limit for Creatinine is approximately 13% higher for people identified as African-American.     . Total Bilirubin 04/28/2016 0.3  0.2 - 1.2 mg/dL Final  . Alkaline Phosphatase 04/28/2016 55  33 - 130 U/L Final  . AST 04/28/2016 45* 10 - 35 U/L Final  . ALT 04/28/2016 44* 6 - 29 U/L Final  . Total  Protein 04/28/2016 6.7  6.1 - 8.1 g/dL Final  . Albumin 04/28/2016 3.8  3.6 - 5.1 g/dL Final  . Calcium 04/28/2016 9.6  8.6 - 10.4 mg/dL Final  . GFR, Est African American 04/28/2016 >89  >=60 mL/min Final  . GFR, Est Non African American 04/28/2016 85  >=60 mL/min Final  . Uric Acid, Serum 04/28/2016 3.7  2.5 - 7.0 mg/dL Final     Imaging: No results found.  Speciality Comments: No specialty comments available.    Procedures:  No procedures performed Allergies: Crestor [rosuvastatin calcium] and Asa [aspirin]   Assessment / Plan:     Visit Diagnoses: Fibromyalgia - Plan: COMPLETE METABOLIC PANEL WITH GFR, CBC with Differential/Platelet, Uric acid, COMPLETE METABOLIC PANEL WITH GFR, CBC with Differential/Platelet, Uric acid  Other fatigue - Plan: COMPLETE METABOLIC PANEL WITH GFR, CBC with Differential/Platelet, Uric acid, COMPLETE METABOLIC PANEL WITH GFR, CBC with Differential/Platelet, Uric acid  Primary insomnia - Plan: COMPLETE METABOLIC PANEL WITH GFR, CBC with Differential/Platelet, Uric acid, COMPLETE METABOLIC PANEL WITH GFR, CBC with Differential/Platelet, Uric acid  Idiopathic chronic gout of multiple sites without tophus - Plan: Uric acid, Uric acid  Osteopenia of multiple sites - Plan: COMPLETE METABOLIC PANEL WITH GFR, CBC with Differential/Platelet, Uric acid, COMPLETE METABOLIC PANEL WITH GFR, CBC with Differential/Platelet, Uric acid  Therapeutic drug monitoring - Plan: Pain Mgmt, Profile 5 w/Conf, U    Plan: #1: Fibromyalgia syndrome; doing well.6 out of 18 tender   #2: 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  #3: High risk prescription On allopurinol 450 milligram daily; Wants to stop hydrocodoneOr use it minimally; will use gabapentin300 mg in the morning, 300 mg in the afternoon, and I wrote a new prescription for 600 mg to use only at night. I asked the patient to repeat these directions to me and she did that and she states  that this is easy for her and she'll be happy to do that  History of mild anemia Hemoglobin 04/28/2016 10.6*  HCT 04/28/2016 33.7*    CBC with differential, CMP with GFR, uric acid are WNL. Uric Acid, Serum 04/28/2016 3.7     CBC with differential, CMP with GFR, uric acid do every 6 months(we will do them today in office)   #4: Fatigue and insomnia.  #5: Osteopenia  Doing well  #6:Return to clinic in 5 months  #7: She will use Voltaren gel for the right shoulder joint pain that she is having today for the last couple days as well as Voltaren gel for the right knee pain that she's been having for the last couple of days. Dr. Estanislado Pandy gave her cortisone injection at the last visit and that has done her well until the last couple of days. Her right knee was swollen and she states that there was fluid in there but that has all gone away. It was bad enough for she was unable to go to church. On exam today, she does not have any swelling/effusion to the right knee. There is mild warmth. I feel that it is not bad enough to require cortisone injection at this time. The Voltaren gel, sensible shoes will be enough to address her needs for this time. If things get worse, she can come back for cortisone shot.  Orders: Orders Placed This Encounter  Procedures  . COMPLETE METABOLIC PANEL WITH GFR  . CBC with Differential/Platelet  . Uric acid   Meds ordered this encounter  Medications  . diclofenac sodium (VOLTAREN) 1 % GEL  Sig: Voltaren Gel 3 grams to 3 large joints upto TID 3 TUBES with 3 refills    Dispense:  3 Tube    Refill:  3    Voltaren Gel 3 grams to 3 large joints upto TID 5TUBES with 3 refills    Order Specific Question:   Supervising Provider    Answer:   Bo Merino [[id number]]  . gabapentin (NEURONTIN) 600 MG tablet    Sig: Take 1 tablet (600 mg total) by mouth at bedtime.    Dispense:  30 tablet    Refill:  2    Order Specific Question:   Supervising Provider     Answer:   Bo Merino [[id number]]  . allopurinol (ZYLOPRIM) 300 MG tablet    Sig: Take 1.5 tablets (450 mg total) by mouth daily.    Dispense:  45 tablet    Refill:  5    Order Specific Question:   Supervising Provider    Answer:   Bo Merino [[id number]]  . baclofen (LIORESAL) 10 MG tablet    Sig: Take 1 tablet (10 mg total) by mouth 3 (three) times daily.    Dispense:  30 each    Refill:  5    Order Specific Question:   Supervising Provider    Answer:   Bo Merino [phone]    Face-to-face time spent with patient was 30 minutes. 50% of time was spent in counseling and coordination of care.  Follow-Up Instructions: Return in about 5 months (around 04/30/2017) for FMS, FATIGUE,INSOMNIA, openia, rt knee pain, rt sj pain (vol gel).   Eliezer Lofts, PA-C  Note - This record has been created using [employer].  Chart creation errors have been sought, but may not always  have been located. Such creation errors do not reflect on  the standard of medical care.

## 2016-11-30 ENCOUNTER — Other Ambulatory Visit: Payer: Self-pay | Admitting: Rheumatology

## 2016-11-30 ENCOUNTER — Ambulatory Visit (INDEPENDENT_AMBULATORY_CARE_PROVIDER_SITE_OTHER): Payer: PPO | Admitting: Rheumatology

## 2016-11-30 ENCOUNTER — Encounter: Payer: Self-pay | Admitting: Rheumatology

## 2016-11-30 VITALS — BP 180/81 | HR 114 | Resp 18 | Ht 65.5 in | Wt 190.0 lb

## 2016-11-30 DIAGNOSIS — M8589 Other specified disorders of bone density and structure, multiple sites: Secondary | ICD-10-CM

## 2016-11-30 DIAGNOSIS — F5101 Primary insomnia: Secondary | ICD-10-CM | POA: Diagnosis not present

## 2016-11-30 DIAGNOSIS — M797 Fibromyalgia: Secondary | ICD-10-CM | POA: Diagnosis not present

## 2016-11-30 DIAGNOSIS — Z5181 Encounter for therapeutic drug level monitoring: Secondary | ICD-10-CM

## 2016-11-30 DIAGNOSIS — M1A09X0 Idiopathic chronic gout, multiple sites, without tophus (tophi): Secondary | ICD-10-CM

## 2016-11-30 DIAGNOSIS — R5383 Other fatigue: Secondary | ICD-10-CM

## 2016-11-30 LAB — CBC WITH DIFFERENTIAL/PLATELET
BASOS ABS: 46 {cells}/uL (ref 0–200)
Basophils Relative: 0.3 %
EOS PCT: 0.8 %
Eosinophils Absolute: 122 cells/uL (ref 15–500)
HEMATOCRIT: 25.5 % — AB (ref 35.0–45.0)
HEMOGLOBIN: 7.3 g/dL — AB (ref 11.7–15.5)
LYMPHS ABS: 2081 {cells}/uL (ref 850–3900)
MCH: 21.9 pg — ABNORMAL LOW (ref 27.0–33.0)
MCHC: 28.6 g/dL — ABNORMAL LOW (ref 32.0–36.0)
MCV: 76.6 fL — ABNORMAL LOW (ref 80.0–100.0)
MPV: 9.9 fL (ref 7.5–12.5)
Monocytes Relative: 5.7 %
NEUTROS ABS: 12179 {cells}/uL — AB (ref 1500–7800)
Neutrophils Relative %: 79.6 %
Platelets: 430 10*3/uL — ABNORMAL HIGH (ref 140–400)
RBC: 3.33 10*6/uL — AB (ref 3.80–5.10)
RDW: 16.1 % — ABNORMAL HIGH (ref 11.0–15.0)
Total Lymphocyte: 13.6 %
WBC mixed population: 872 cells/uL (ref 200–950)
WBC: 15.3 10*3/uL — AB (ref 3.8–10.8)

## 2016-11-30 LAB — URIC ACID: Uric Acid, Serum: 4.2 mg/dL (ref 2.5–7.0)

## 2016-11-30 LAB — COMPLETE METABOLIC PANEL WITH GFR
AG Ratio: 1.5 (calc) (ref 1.0–2.5)
ALT: 20 U/L (ref 6–29)
AST: 20 U/L (ref 10–35)
Albumin: 4.2 g/dL (ref 3.6–5.1)
Alkaline phosphatase (APISO): 74 U/L (ref 33–130)
BILIRUBIN TOTAL: 0.3 mg/dL (ref 0.2–1.2)
BUN: 10 mg/dL (ref 7–25)
CALCIUM: 9.4 mg/dL (ref 8.6–10.4)
CHLORIDE: 103 mmol/L (ref 98–110)
CO2: 28 mmol/L (ref 20–32)
Creat: 0.84 mg/dL (ref 0.50–0.99)
GFR, Est African American: 83 mL/min/{1.73_m2} (ref >=60)
GFR, Est Non African American: 71 mL/min/{1.73_m2} (ref >=60)
GLUCOSE: 110 mg/dL — AB (ref 65–99)
Globulin: 2.8 g/dL (calc) (ref 1.9–3.7)
Potassium: 3.9 mmol/L (ref 3.5–5.3)
Sodium: 141 mmol/L (ref 135–146)
TOTAL PROTEIN: 7 g/dL (ref 6.1–8.1)

## 2016-11-30 MED ORDER — DICLOFENAC SODIUM 1 % TD GEL: TRANSDERMAL | 3 refills | Status: DC

## 2016-11-30 MED ORDER — ALLOPURINOL 300 MG PO TABS: 450.0000 mg | ORAL_TABLET | Freq: Every day | ORAL | 5 refills | Status: DC

## 2016-11-30 MED ORDER — BACLOFEN 10 MG PO TABS: 10.0000 mg | ORAL_TABLET | Freq: Three times a day (TID) | ORAL | 5 refills | Status: DC

## 2016-11-30 MED ORDER — GABAPENTIN 600 MG PO TABS: 600.0000 mg | ORAL_TABLET | Freq: Every day | ORAL | 2 refills | Status: DC

## 2016-12-03 ENCOUNTER — Inpatient Hospital Stay (HOSPITAL_COMMUNITY)
Admission: EM | Admit: 2016-12-03 | Discharge: 2016-12-05 | DRG: 812 | Disposition: A | Discharge status: Home / Self Care | Payer: PPO | Attending: Internal Medicine | Admitting: Internal Medicine

## 2016-12-03 ENCOUNTER — Emergency Department (HOSPITAL_COMMUNITY): Payer: PPO

## 2016-12-03 ENCOUNTER — Encounter (HOSPITAL_COMMUNITY): Payer: Self-pay

## 2016-12-03 DIAGNOSIS — J309 Allergic rhinitis, unspecified: Secondary | ICD-10-CM | POA: Diagnosis present

## 2016-12-03 DIAGNOSIS — Z23 Encounter for immunization: Secondary | ICD-10-CM

## 2016-12-03 DIAGNOSIS — M19041 Primary osteoarthritis, right hand: Secondary | ICD-10-CM | POA: Diagnosis present

## 2016-12-03 DIAGNOSIS — M19042 Primary osteoarthritis, left hand: Secondary | ICD-10-CM | POA: Diagnosis present

## 2016-12-03 DIAGNOSIS — K589 Irritable bowel syndrome without diarrhea: Secondary | ICD-10-CM | POA: Diagnosis not present

## 2016-12-03 DIAGNOSIS — Z888 Allergy status to other drugs, medicaments and biological substances status: Secondary | ICD-10-CM

## 2016-12-03 DIAGNOSIS — M609 Myositis, unspecified: Secondary | ICD-10-CM | POA: Diagnosis present

## 2016-12-03 DIAGNOSIS — K633 Ulcer of intestine: Secondary | ICD-10-CM | POA: Diagnosis present

## 2016-12-03 DIAGNOSIS — Z8679 Personal history of other diseases of the circulatory system: Secondary | ICD-10-CM

## 2016-12-03 DIAGNOSIS — D649 Anemia, unspecified: Secondary | ICD-10-CM | POA: Diagnosis present

## 2016-12-03 DIAGNOSIS — G894 Chronic pain syndrome: Secondary | ICD-10-CM | POA: Diagnosis present

## 2016-12-03 DIAGNOSIS — I10 Essential (primary) hypertension: Secondary | ICD-10-CM | POA: Diagnosis present

## 2016-12-03 DIAGNOSIS — Z886 Allergy status to analgesic agent status: Secondary | ICD-10-CM

## 2016-12-03 DIAGNOSIS — E785 Hyperlipidemia, unspecified: Secondary | ICD-10-CM | POA: Diagnosis present

## 2016-12-03 DIAGNOSIS — E559 Vitamin D deficiency, unspecified: Secondary | ICD-10-CM | POA: Diagnosis present

## 2016-12-03 DIAGNOSIS — K580 Irritable bowel syndrome with diarrhea: Secondary | ICD-10-CM | POA: Diagnosis present

## 2016-12-03 DIAGNOSIS — E119 Type 2 diabetes mellitus without complications: Secondary | ICD-10-CM | POA: Diagnosis present

## 2016-12-03 DIAGNOSIS — M109 Gout, unspecified: Secondary | ICD-10-CM | POA: Diagnosis present

## 2016-12-03 DIAGNOSIS — Z8249 Family history of ischemic heart disease and other diseases of the circulatory system: Secondary | ICD-10-CM

## 2016-12-03 DIAGNOSIS — Z79899 Other long term (current) drug therapy: Secondary | ICD-10-CM

## 2016-12-03 DIAGNOSIS — K5730 Diverticulosis of large intestine without perforation or abscess without bleeding: Secondary | ICD-10-CM | POA: Diagnosis present

## 2016-12-03 DIAGNOSIS — M797 Fibromyalgia: Secondary | ICD-10-CM | POA: Diagnosis present

## 2016-12-03 DIAGNOSIS — D509 Iron deficiency anemia, unspecified: Principal | ICD-10-CM | POA: Diagnosis present

## 2016-12-03 DIAGNOSIS — F411 Generalized anxiety disorder: Secondary | ICD-10-CM | POA: Diagnosis present

## 2016-12-03 DIAGNOSIS — Z8711 Personal history of peptic ulcer disease: Secondary | ICD-10-CM

## 2016-12-03 DIAGNOSIS — R195 Other fecal abnormalities: Secondary | ICD-10-CM

## 2016-12-03 DIAGNOSIS — R52 Pain, unspecified: Secondary | ICD-10-CM

## 2016-12-03 DIAGNOSIS — F988 Other specified behavioral and emotional disorders with onset usually occurring in childhood and adolescence: Secondary | ICD-10-CM | POA: Diagnosis present

## 2016-12-03 DIAGNOSIS — F329 Major depressive disorder, single episode, unspecified: Secondary | ICD-10-CM | POA: Diagnosis present

## 2016-12-03 DIAGNOSIS — K219 Gastro-esophageal reflux disease without esophagitis: Secondary | ICD-10-CM | POA: Diagnosis present

## 2016-12-03 DIAGNOSIS — E7800 Pure hypercholesterolemia, unspecified: Secondary | ICD-10-CM | POA: Diagnosis present

## 2016-12-03 DIAGNOSIS — K2270 Barrett's esophagus without dysplasia: Secondary | ICD-10-CM | POA: Diagnosis present

## 2016-12-03 DIAGNOSIS — Z9049 Acquired absence of other specified parts of digestive tract: Secondary | ICD-10-CM

## 2016-12-03 DIAGNOSIS — Z791 Long term (current) use of non-steroidal anti-inflammatories (NSAID): Secondary | ICD-10-CM

## 2016-12-03 DIAGNOSIS — K449 Diaphragmatic hernia without obstruction or gangrene: Secondary | ICD-10-CM | POA: Diagnosis present

## 2016-12-03 DIAGNOSIS — Z8739 Personal history of other diseases of the musculoskeletal system and connective tissue: Secondary | ICD-10-CM

## 2016-12-03 DIAGNOSIS — Z8042 Family history of malignant neoplasm of prostate: Secondary | ICD-10-CM

## 2016-12-03 DIAGNOSIS — Z83511 Family history of glaucoma: Secondary | ICD-10-CM

## 2016-12-03 DIAGNOSIS — Z8719 Personal history of other diseases of the digestive system: Secondary | ICD-10-CM

## 2016-12-03 DIAGNOSIS — R05 Cough: Secondary | ICD-10-CM | POA: Diagnosis not present

## 2016-12-03 DIAGNOSIS — Z7982 Long term (current) use of aspirin: Secondary | ICD-10-CM

## 2016-12-03 DIAGNOSIS — F32A Depression, unspecified: Secondary | ICD-10-CM | POA: Diagnosis present

## 2016-12-03 LAB — PAIN MGMT, PROFILE 5 W/CONF, U
ALPHAHYDROXYMIDAZOLAM: NEGATIVE ng/mL (ref <50)
ALPHAHYDROXYTRIAZOLAM: NEGATIVE ng/mL (ref <50)
AMINOCLONAZEPAM: NEGATIVE ng/mL (ref <25)
Alphahydroxyalprazolam: NEGATIVE ng/mL (ref <25)
Amphetamines: NEGATIVE ng/mL (ref <500)
Barbiturates: NEGATIVE ng/mL (ref <300)
Benzodiazepines: NEGATIVE ng/mL (ref <100)
CODEINE: NEGATIVE ng/mL (ref <50)
Cocaine Metabolite: NEGATIVE ng/mL (ref <150)
Creatinine: >350 mg/dL
Hydrocodone: NEGATIVE ng/mL (ref <50)
Hydromorphone: NEGATIVE ng/mL (ref <50)
Hydroxyethylflurazepam: NEGATIVE ng/mL (ref <50)
Lorazepam: NEGATIVE ng/mL (ref <50)
METHADONE METABOLITE: NEGATIVE ng/mL (ref <100)
MORPHINE: NEGATIVE ng/mL (ref <50)
Marijuana Metabolite: NEGATIVE ng/mL (ref <20)
NORDIAZEPAM: NEGATIVE ng/mL (ref <50)
NORHYDROCODONE: NEGATIVE ng/mL (ref <50)
OPIATES: NEGATIVE ng/mL (ref <100)
OXAZEPAM: NEGATIVE ng/mL (ref <50)
OXYCODONE: NEGATIVE ng/mL (ref <100)
Oxidant: NEGATIVE ug/mL (ref <200)
TEMAZEPAM: NEGATIVE ng/mL (ref <50)
pH: 6.01 (ref 4.5–9.0)

## 2016-12-03 LAB — BASIC METABOLIC PANEL
ANION GAP: 8 (ref 5–15)
BUN: 15 mg/dL (ref 6–20)
CO2: 26 mmol/L (ref 22–32)
Calcium: 9.5 mg/dL (ref 8.9–10.3)
Chloride: 105 mmol/L (ref 101–111)
Creatinine, Ser: 0.82 mg/dL (ref 0.44–1.00)
GFR calc non Af Amer: >60 mL/min (ref >60)
GLUCOSE: 115 mg/dL — AB (ref 65–99)
Potassium: 4 mmol/L (ref 3.5–5.1)
SODIUM: 139 mmol/L (ref 135–145)

## 2016-12-03 LAB — HEMOGLOBIN A1C
HEMOGLOBIN A1C: 6.3 % — AB (ref 4.8–5.6)
Mean Plasma Glucose: 134.11 mg/dL

## 2016-12-03 LAB — RETICULOCYTES
RBC.: 3.73 MIL/uL — ABNORMAL LOW (ref 3.87–5.11)
RETIC COUNT ABSOLUTE: 52.2 10*3/uL (ref 19.0–186.0)
Retic Ct Pct: 1.4 % (ref 0.4–3.1)

## 2016-12-03 LAB — CBC
HCT: 24 % — ABNORMAL LOW (ref 36.0–46.0)
HEMOGLOBIN: 6.7 g/dL — AB (ref 12.0–15.0)
MCH: 21.3 pg — AB (ref 26.0–34.0)
MCHC: 27.9 g/dL — ABNORMAL LOW (ref 30.0–36.0)
MCV: 76.2 fL — AB (ref 78.0–100.0)
Platelets: 397 10*3/uL (ref 150–400)
RBC: 3.15 MIL/uL — AB (ref 3.87–5.11)
RDW: 17.4 % — ABNORMAL HIGH (ref 11.5–15.5)
WBC: 7.1 10*3/uL (ref 4.0–10.5)

## 2016-12-03 LAB — APTT: APTT: 27 s (ref 24–36)

## 2016-12-03 LAB — GLUCOSE, CAPILLARY
GLUCOSE-CAPILLARY: 93 mg/dL (ref 65–99)
Glucose-Capillary: 93 mg/dL (ref 65–99)

## 2016-12-03 LAB — SAVE SMEAR

## 2016-12-03 LAB — POC OCCULT BLOOD, ED: FECAL OCCULT BLD: NEGATIVE

## 2016-12-03 LAB — PROTIME-INR
INR: 0.99
Prothrombin Time: 13 seconds (ref 11.4–15.2)

## 2016-12-03 LAB — ABO/RH: ABO/RH(D): A POS

## 2016-12-03 LAB — PREPARE RBC (CROSSMATCH)

## 2016-12-03 MED ORDER — SODIUM CHLORIDE 0.9 % IV SOLN: INTRAVENOUS | Status: DC

## 2016-12-03 MED ORDER — SENNOSIDES-DOCUSATE SODIUM 8.6-50 MG PO TABS: 1.0000 | ORAL_TABLET | Freq: Every evening | ORAL | Status: DC | PRN

## 2016-12-03 MED ORDER — ACETAMINOPHEN 325 MG PO TABS
650.0000 mg | ORAL_TABLET | Freq: Four times a day (QID) | ORAL | Status: DC | PRN
  Administered 2016-12-04: 650 mg via ORAL
  Filled 2016-12-03: qty 2

## 2016-12-03 MED ORDER — INFLUENZA VAC SPLIT HIGH-DOSE 0.5 ML IM SUSY
0.5000 mL | PREFILLED_SYRINGE | INTRAMUSCULAR | Status: AC
  Administered 2016-12-04: 0.5 mL via INTRAMUSCULAR
  Filled 2016-12-03: qty 0.5

## 2016-12-03 MED ORDER — DULOXETINE HCL 60 MG PO CPEP
60.0000 mg | ORAL_CAPSULE | Freq: Two times a day (BID) | ORAL | Status: DC
  Administered 2016-12-03 – 2016-12-05 (×4): 60 mg via ORAL
  Filled 2016-12-03 (×5): qty 1

## 2016-12-03 MED ORDER — ACETAMINOPHEN 650 MG RE SUPP: 650.0000 mg | Freq: Four times a day (QID) | RECTAL | Status: DC | PRN

## 2016-12-03 MED ORDER — GUAIFENESIN ER 600 MG PO TB12: 600.0000 mg | ORAL_TABLET | Freq: Every day | ORAL | Status: DC | PRN

## 2016-12-03 MED ORDER — CALCIUM CARBONATE 1500 (600 CA) MG PO TABS
600.0000 mg | ORAL_TABLET | Freq: Every day | ORAL | Status: DC
  Filled 2016-12-03: qty 1

## 2016-12-03 MED ORDER — CALCIUM CARBONATE 1250 (500 CA) MG PO TABS
1.0000 | ORAL_TABLET | Freq: Every day | ORAL | Status: DC
  Administered 2016-12-04 – 2016-12-05 (×2): 500 mg via ORAL
  Filled 2016-12-03 (×2): qty 1

## 2016-12-03 MED ORDER — SODIUM CHLORIDE 0.9 % IV BOLUS (SEPSIS)
250.0000 mL | Freq: Once | INTRAVENOUS | Status: AC
  Administered 2016-12-03: 250 mL via INTRAVENOUS

## 2016-12-03 MED ORDER — BACLOFEN 10 MG PO TABS: 10.0000 mg | ORAL_TABLET | Freq: Three times a day (TID) | ORAL | Status: DC | PRN

## 2016-12-03 MED ORDER — GABAPENTIN 600 MG PO TABS
600.0000 mg | ORAL_TABLET | Freq: Every day | ORAL | Status: DC
  Administered 2016-12-03 – 2016-12-04 (×2): 600 mg via ORAL
  Filled 2016-12-03 (×2): qty 1

## 2016-12-03 MED ORDER — ONDANSETRON HCL 4 MG PO TABS: 4.0000 mg | ORAL_TABLET | Freq: Four times a day (QID) | ORAL | Status: DC | PRN

## 2016-12-03 MED ORDER — METOPROLOL SUCCINATE ER 100 MG PO TB24
100.0000 mg | ORAL_TABLET | Freq: Every day | ORAL | Status: DC
  Administered 2016-12-03 – 2016-12-05 (×3): 100 mg via ORAL
  Filled 2016-12-03 (×3): qty 1

## 2016-12-03 MED ORDER — INSULIN ASPART 100 UNIT/ML ~~LOC~~ SOLN: 0.0000 [IU] | Freq: Three times a day (TID) | SUBCUTANEOUS | Status: DC

## 2016-12-03 MED ORDER — BISACODYL 10 MG RE SUPP: 10.0000 mg | Freq: Every day | RECTAL | Status: DC | PRN

## 2016-12-03 MED ORDER — DICYCLOMINE HCL 20 MG PO TABS: 20.0000 mg | ORAL_TABLET | Freq: Three times a day (TID) | ORAL | Status: DC | PRN

## 2016-12-03 MED ORDER — SODIUM CHLORIDE 0.9 % IV SOLN
INTRAVENOUS | Status: DC
Start: 2016-12-03 — End: 2016-12-05
  Administered 2016-12-03 – 2016-12-05 (×4): via INTRAVENOUS

## 2016-12-03 MED ORDER — HYDROCHLOROTHIAZIDE 25 MG PO TABS
25.0000 mg | ORAL_TABLET | Freq: Every day | ORAL | Status: DC
  Administered 2016-12-03 – 2016-12-05 (×3): 25 mg via ORAL
  Filled 2016-12-03 (×3): qty 1

## 2016-12-03 MED ORDER — ONDANSETRON HCL 4 MG/2ML IJ SOLN: 4.0000 mg | Freq: Four times a day (QID) | INTRAMUSCULAR | Status: DC | PRN

## 2016-12-03 MED ORDER — FLUTICASONE PROPIONATE 50 MCG/ACT NA SUSP
1.0000 | Freq: Every day | NASAL | Status: DC
  Administered 2016-12-04 – 2016-12-05 (×2): 1 via NASAL
  Filled 2016-12-03: qty 16

## 2016-12-03 MED ORDER — RALOXIFENE HCL 60 MG PO TABS
60.0000 mg | ORAL_TABLET | Freq: Every day | ORAL | Status: DC
  Administered 2016-12-03 – 2016-12-05 (×3): 60 mg via ORAL
  Filled 2016-12-03 (×3): qty 1

## 2016-12-03 MED ORDER — ALLOPURINOL 300 MG PO TABS
450.0000 mg | ORAL_TABLET | Freq: Every day | ORAL | Status: DC
  Administered 2016-12-03 – 2016-12-04 (×2): 450 mg via ORAL
  Filled 2016-12-03: qty 1
  Filled 2016-12-03: qty 1.5
  Filled 2016-12-03: qty 2
  Filled 2016-12-03: qty 1

## 2016-12-03 MED ORDER — PANTOPRAZOLE SODIUM 40 MG IV SOLR
40.0000 mg | Freq: Two times a day (BID) | INTRAVENOUS | Status: DC
  Administered 2016-12-03 – 2016-12-04 (×3): 40 mg via INTRAVENOUS
  Filled 2016-12-03 (×3): qty 40

## 2016-12-03 MED ORDER — LOSARTAN POTASSIUM 50 MG PO TABS
100.0000 mg | ORAL_TABLET | Freq: Every day | ORAL | Status: DC
  Administered 2016-12-03 – 2016-12-05 (×3): 100 mg via ORAL
  Filled 2016-12-03 (×3): qty 2

## 2016-12-03 MED ORDER — AMLODIPINE BESYLATE 10 MG PO TABS
10.0000 mg | ORAL_TABLET | Freq: Every day | ORAL | Status: DC
  Administered 2016-12-03 – 2016-12-05 (×3): 10 mg via ORAL
  Filled 2016-12-03 (×3): qty 1

## 2016-12-03 MED ORDER — IOPAMIDOL (ISOVUE-300) INJECTION 61%
INTRAVENOUS | Status: AC
  Administered 2016-12-03: 100 mL
  Filled 2016-12-03: qty 100

## 2016-12-03 MED ORDER — QUETIAPINE FUMARATE 300 MG PO TABS
300.0000 mg | ORAL_TABLET | Freq: Every day | ORAL | Status: DC
  Administered 2016-12-03 – 2016-12-04 (×2): 300 mg via ORAL
  Filled 2016-12-03 (×2): qty 1

## 2016-12-03 MED ORDER — SODIUM CHLORIDE 0.9 % IV SOLN: 10.0000 mL/h | Freq: Once | INTRAVENOUS | Status: DC

## 2016-12-03 NOTE — ED Notes (Addendum)
Patient complaining of weakness and dark black stools for a little over a week.  States she was told she has an ulcer in May 2018 and is supposed to have a follow up colonoscopy in November 2018.

## 2016-12-03 NOTE — ED Triage Notes (Signed)
Patient reports that she was called by her MD yesterday and told that her blood work was abnormal. Patient reports that she is unsure if her Hgb was low or WBC altered, no complaints

## 2016-12-03 NOTE — Progress Notes (Signed)
Patient admitted to [facility]  Room No 5  At 4,48 pm. Patient is  ADL- indepentant ,has telemetry box 17  SR .NPO.

## 2016-12-03 NOTE — H&P (Addendum)
History and Physical    Tammy Mullins TGY:[id number] DOB: Sep 21, 1950 DOA: 12/03/2016  PCP: Maury Dus, MD  Patient coming from:   I have personally briefly reviewed patient's old medical records in [place]  Chief Complaint: abdominal discomfort and back pain  HPI: Tammy Mullins is a 66 y.o. female with medical history significant of IBS, history of peptic ulcer in May 2018 patient had a colonoscopy showing some erosion in the colon apparently, came in with complaints of abdominal discomfort and dark black stools. Her hemoglobin was 11.8 in April 2017 came down to 10.6 in March 2018, 7.3 in October 2018 down to 6.7 today. She denies any nausea or vomiting or weight loss. She takes naproxen 500 mg twice a day at home. She denies any fevers chills chest pain shortness of breath no nausea vomiting headache changes within vision or urinary complaints at this time. Denies any hematuria hematemesis shortness of breath cough at this time.  ED Course: She was found to have a hemoglobin of 6.7 she was started on blood transfusion. 2 sets of blood transfusion is ordered she is on the first unit at this time. Fecal occult blood test was negative.  Review of Systems: As per HPI otherwise 10 point review of systems negative.   Past Medical History:  Diagnosis Date  . Allergic rhinitis, cause unspecified   . Anxiety state, unspecified   . Arthritis   . Attention deficit disorder without mention of hyperactivity   . Barrett's esophagus   . Chronic pain syndrome   . 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 Depression   . Diabetes type 2, controlled ([place]) 01/09/2014  . Diverticulosis of colon (without mention of hemorrhage)   . Esophageal reflux   . Fibromyalgia   . Gout, unspecified   . Hiatal hernia   . Irritable bowel syndrome   . 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 Myalgia and myositis, unspecified   . Osteoarthrosis, unspecified whether generalized or localized,  unspecified site   . Personal history of unspecified circulatory disease   . Pure hypercholesterolemia   . Radial styloid tenosynovitis   . Unspecified essential hypertension   . Unspecified nonpsychotic mental disorder   . Unspecified pruritic disorder     Past Surgical History:  Procedure Laterality Date  . ANKLE SURGERY Right   . APPENDECTOMY    . CARPAL TUNNEL RELEASE Right   . CERVICAL SPINE SURGERY     plates and pins  . CHOLECYSTECTOMY N/A 06/26/2015   Procedure: LAPAROSCOPIC CHOLECYSTECTOMY;  Surgeon: Ralene Ok, MD;  Location: [facility];  Service: General;  Laterality: N/A;  . COLONOSCOPY    . ESOPHAGOGASTRODUODENOSCOPY    . LAPAROSCOPIC APPENDECTOMY N/A 06/26/2015   Procedure: LAPAROSCOPIC APPENDECTOMY;  Surgeon: Ralene Ok, MD;  Location: [facility];  Service: General;  Laterality: N/A;  . ROTATOR CUFF REPAIR Right   . SALPINGOOPHORECTOMY  Left   ectopic 1980     reports that she has never smoked. She has never used smokeless tobacco. She reports that she does not drink alcohol or use drugs.  Allergies  Allergen Reactions  . Crestor [Rosuvastatin Calcium] Other (See Comments)    Causes severe body aches  . Asa [Aspirin]     GI upset in hight doses can take a 81mg     Family History  Problem Relation Age of Onset  . Cirrhosis Father   . Cancer Maternal Grandmother        ?  . Lung  disease Mother        ?   . Glaucoma Mother   . Glaucoma Brother   . Prostate cancer Brother   . Heart disease Sister   . Kidney disease Neg Hx   . Diabetes Neg Hx    Unacceptable: Noncontributory, unremarkable, or negative. Acceptable: Family history reviewed and not pertinent (If you reviewed it)  Prior to Admission medications   Medication Sig Start Date End Date Taking? Authorizing Provider  allopurinol (ZYLOPRIM) 300 MG tablet Take 1.5 tablets (450 mg total) by mouth daily. 11/30/16  Yes Panwala, Naitik, PA-C  amLODipine (NORVASC) 10 MG tablet TAKE 1 TABLET DAILY 10/21/13   Yes Debbrah Alar, NP  aspirin 81 MG tablet Take 81 mg by mouth daily.     Yes [provider]  baclofen (LIORESAL) 10 MG tablet Take 1 tablet (10 mg total) by mouth 3 (three) times daily. Patient taking differently: Take 10 mg by mouth 3 (three) times daily as needed for muscle spasms.  11/30/16 05/29/17 Yes Panwala, Naitik, PA-C  calcium carbonate (OSCAL) 1500 (600 Ca) MG TABS tablet Take 600 mg of elemental calcium by mouth daily with breakfast.   Yes [provider]  dicyclomine (BENTYL) 20 MG tablet Take 1 tablet (20 mg total) by mouth 3 (three) times daily as needed. 11/28/12  Yes Noralee Space, MD  DULoxetine (CYMBALTA) 60 MG capsule Take 60 mg by mouth 2 (two) times daily.  04/18/16  Yes [provider]  esomeprazole (NEXIUM) 20 MG capsule Take 20 mg by mouth daily at 12 noon.   Yes [provider]  gabapentin (NEURONTIN) 600 MG tablet Take 1 tablet (600 mg total) by mouth at bedtime. 11/30/16 02/28/17 Yes Panwala, Naitik, PA-C  guaiFENesin (MUCINEX) 600 MG 12 hr tablet Take 600 mg by mouth daily as needed for cough or to loosen phlegm.   Yes [provider]  hydrochlorothiazide (HYDRODIURIL) 25 MG tablet Take 1 tablet (25 mg total) by mouth daily. 12/18/13  Yes Debbrah Alar, NP  losartan (COZAAR) 100 MG tablet Take 1 tablet (100 mg total) by mouth daily. 05/08/12  Yes Noralee Space, MD  metoprolol succinate (TOPROL-XL) 100 MG 24 hr tablet TAKE 1 TABLET DAILY 10/21/13  Yes Debbrah Alar, NP  mometasone (NASONEX) 50 MCG/ACT nasal spray Place 2 sprays into the nose daily as needed (FOR ALLERGIES).   Yes [provider]  QUEtiapine (SEROQUEL) 300 MG tablet Take 300 mg by mouth at bedtime.    Yes [provider]  raloxifene (EVISTA) 60 MG tablet Take 60 mg by mouth daily.  05/11/15  Yes [provider]  Calcium Carbonate-Vit D-Min (CALCIUM 1200) 1200-1000 MG-UNIT CHEW Chew 1 tablet by mouth daily.    [provider]  Cholecalciferol (VITAMIN D) 2000 UNITS CAPS Take 2,000 Units by mouth daily.     [provider]  diclofenac sodium (VOLTAREN) 1 % GEL Voltaren Gel 3 grams to 3 large joints upto TID 3 TUBES with 3 refills 11/30/16   Panwala, Naitik, PA-C  fluticasone (FLONASE) 50 MCG/ACT nasal spray Place 2 sprays into both nostrils at bedtime. 10/21/13   Debbrah Alar, NP  HYDROcodone-acetaminophen (NORCO/VICODIN) 5-325 MG tablet Take 1 tablet by mouth at bedtime as needed for moderate pain. Patient not taking: Reported on 11/30/2016 09/07/16   Bo Merino, MD  lidocaine (XYLOCAINE) 5 % ointment Apply 1 application topically 3 (three) times daily as needed.    [provider]  naproxen (NAPROSYN) 500 MG tablet  Take 500 mg by mouth 2 (two) times daily. 05/13/15   [provider]  omeprazole (PRILOSEC) 20 MG capsule TAKE 1 CAPSULE EVERY DAY Patient not taking: Reported on 11/30/2016 10/21/13   Debbrah Alar, NP    Physical Exam: Vitals:   12/03/16 1200 12/03/16 1207 12/03/16 1400 12/03/16 1445  BP: (!) 179/98  (!) 172/78 (!) 186/93  Pulse: 85 87 85 90  Resp: 12 17 11 20   Temp:  98.2 F (36.8 C)  98.7 F (37.1 C)  TempSrc:  Oral  Oral  SpO2: 99% 99% 98%     Constitutional: NAD, calm, comfortable Vitals:   12/03/16 1200 12/03/16 1207 12/03/16 1400 12/03/16 1445  BP: (!) 179/98  (!) 172/78 (!) 186/93  Pulse: 85 87 85 90  Resp: 12 17 11 20   Temp:  98.2 F (36.8 C)  98.7 F (37.1 C)  TempSrc:  Oral  Oral  SpO2: 99% 99% 98%    Eyes: PERRL, lids and conjunctivae normal ENMT: Mucous membranes are moist. Posterior pharynx clear of any exudate or lesions.Normal dentition.  Neck: normal, supple, no masses, no thyromegaly Respiratory: clear to auscultation bilaterally, no wheezing, no crackles. Normal respiratory effort. No accessory muscle use.  Cardiovascular: Regular rate and rhythm, no murmurs / rubs / gallops. No extremity edema. 2+ pedal pulses.  No carotid bruits.  Abdomen: no tenderness, no masses palpated. No hepatosplenomegaly. Bowel sounds positive.  Musculoskeletal: no clubbing / cyanosis. No joint deformity upper and lower extremities. Good ROM, no contractures. Normal muscle tone.  Skin: no rashes, lesions, ulcers. No induration Neurologic: CN 2-12 grossly intact. Sensation intact, DTR normal. Strength 5/5 in all 4.  Psychiatric: Normal judgment and insight. Alert and oriented x 3. Normal mood.    Labs on Admission: I have personally reviewed following labs and imaging studies  CBC:  Recent Labs Lab 11/30/16 1010 12/03/16 0950  WBC 15.3* 7.1  NEUTROABS 12,179*  --   HGB 7.3* 6.7*  HCT 25.5* 24.0*  MCV 76.6* 76.2*  PLT 430* 161   Basic Metabolic Panel:  Recent Labs Lab 11/30/16 1010 12/03/16 0950  NA 141 139  K 3.9 4.0  CL 103 105  CO2 28 26  GLUCOSE 110* 115*  BUN 10 15  CREATININE 0.84 0.82  CALCIUM 9.4 9.5   GFR: Estimated Creatinine Clearance: 71.9 mL/min (by C-G formula based on SCr of 0.82 mg/dL). Liver Function Tests:  Recent Labs Lab 11/30/16 1010  AST 20  ALT 20  BILITOT 0.3  PROT 7.0   No results for input(s): LIPASE, AMYLASE in the last 168 hours. No results for input(s): AMMONIA in the last 168 hours. Coagulation Profile: No results for input(s): INR, PROTIME in the last 168 hours. Cardiac Enzymes: No results for input(s): CKTOTAL, CKMB, CKMBINDEX, TROPONINI in the last 168 hours. BNP (last 3 results) No results for input(s): PROBNP in the last 8760 hours. HbA1C: No results for input(s): HGBA1C in the last 72 hours. CBG: No results for input(s): GLUCAP in the last 168 hours. Lipid Profile: No results for input(s): CHOL, HDL, LDLCALC, TRIG, CHOLHDL, LDLDIRECT in the last 72 hours. Thyroid Function Tests: No results for input(s): TSH, T4TOTAL, FREET4, T3FREE, THYROIDAB in the last 72 hours. Anemia Panel: No results for input(s): VITAMINB12, FOLATE, FERRITIN, TIBC, IRON,  RETICCTPCT in the last 72 hours. Urine analysis:    Component Value Date/Time   COLORURINE YELLOW 05/14/2015 1213   APPEARANCEUR CLOUDY (A) 05/14/2015 1213   LABSPEC >1.030 (H) 05/14/2015 1213  PHURINE 5.5 05/14/2015 1213   GLUCOSEU NEGATIVE 05/14/2015 1213   HGBUR NEGATIVE 05/14/2015 1213   BILIRUBINUR SMALL (A) 05/14/2015 1213   [place] 05/14/2015 1213   PROTEINUR 30 (A) 05/14/2015 1213   NITRITE NEGATIVE 05/14/2015 1213   LEUKOCYTESUR SMALL (A) 05/14/2015 1213    Radiological Exams on Admission: Ct Abdomen Pelvis W Contrast  Result Date: 12/03/2016 CLINICAL DATA:  Low hemoglobin. Dark stools. Distended abdomen with generalized tiredness. EXAM: CT ABDOMEN AND PELVIS WITH CONTRAST TECHNIQUE: Multidetector CT imaging of the abdomen and pelvis was performed using the standard protocol following bolus administration of intravenous contrast. CONTRAST:  120mL ISOVUE-300 IOPAMIDOL (ISOVUE-300) INJECTION 61% COMPARISON:  CT, 05/12/2015 FINDINGS: Lower chest: Lung bases essentially clear.  Heart normal in size. Hepatobiliary: No focal liver abnormality is seen. Status post cholecystectomy. No biliary dilatation. Pancreas: Unremarkable. No pancreatic ductal dilatation or surrounding inflammatory changes. Spleen: Normal in size without focal abnormality. Adrenals/Urinary Tract: Adrenal glands are unremarkable. Kidneys are normal, without renal calculi, focal lesion, or hydronephrosis. Bladder is unremarkable. Stomach/Bowel: Stomach and small bowel unremarkable. There scattered colonic diverticula. No diverticulitis. Colon otherwise unremarkable. Vascular/Lymphatic: Aortic atherosclerosis. No enlarged abdominal or pelvic lymph nodes. Reproductive: Several uterine calcifications likely in small fibroids. Uterus otherwise unremarkable. Calcification along the posterior margin of a normal size right ovary. No adnexal masses. Other: No abdominal wall hernia or abnormality. No abdominopelvic  ascites. Musculoskeletal: No fracture or acute finding. No osteoblastic or osteolytic lesions. IMPRESSION: 1. No acute findings. 2. Colonic diverticulum.  No other bowel abnormality. 3. Status post cholecystectomy. 4. Aortic atherosclerosis. Electronically Signed   By: Lajean Manes M.D.   On: 12/03/2016 12:55    EKG: Independently reviewed.   Assessment/Plan Active Problems:   HYPERCHOLESTEROLEMIA   Anxiety state   Depression   Attention deficit disorder   Chronic pain syndrome   Essential hypertension   Allergic rhinitis   GERD   Diverticulosis of colon   Irritable bowel syndrome   Primary osteoarthritis of both hands   Fibromyalgia   History of cardiovascular disorder   Vitamin D deficiency   History of gout   Dyslipidemia   Pain management   History of gastroesophageal reflux (GERD)   Colon ulcer - IC valve   Anemia Upper GI bleed with hemoglobin 6.7. though F OBT is negative with a history of ulcer disease and NSAID use patient probably has slow GI bleed. 2 units of blood to be transfused. We'll start her on Protonix 40 mg twice a day. Avoid any further NSAID use. IV fluids. GI consult has been called. Patient will be nothing by mouth except medications.  Anemia due to blood loss anemia workup has been sent prior to blood transfusion. PT PTT INR will be added.  Gout continue colchicine  Hypertension we will continue Norvasc, Cozaar, and Toprol.hctz on Hold. Hydrochlorothiazide.  IBS continue Bentyl.  Depression continue Cymbalta.     DVT prophylaxisscd Code Status: full Family Communication: none Disposition Plan: tbd Consults called: gi Admission status: tele   Georgette Shell MD [facility]   If 7PM-7AM, please contact night-coverage [email or web address] Password [email or web address]  12/03/2016, 2:59 PM

## 2016-12-03 NOTE — ED Provider Notes (Signed)
[facility] DEPT Provider Note   CSN: [id number] Arrival date & time: 12/03/16  0940     History   Chief Complaint No chief complaint on file.   HPI Tammy Mullins is a 66 y.o. female.  Patient sent in by primary care office [facility] for low hemoglobin. Hemoglobin was 7 on October 3. Patient without any real symptoms other than some abdominal discomfort. Patient has a history of IBS. States it belly feels different than usual. Stools have been dark. However patient takes Pepto-Bismol intermittently for her irritable bowel syndrome. Patient denies any gross blood. No vaginal bleeding. No blood in the urine. Patient otherwise asymptomatic.      Past Medical History:  Diagnosis Date  . Allergic rhinitis, cause unspecified   . Anxiety state, unspecified   . Arthritis   . Attention deficit disorder without mention of hyperactivity   . Barrett's esophagus   . Chronic pain syndrome   . 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 Depression   . Diabetes type 2, controlled ([place]) 01/09/2014  . Diverticulosis of colon (without mention of hemorrhage)   . Esophageal reflux   . Fibromyalgia   . Gout, unspecified   . Hiatal hernia   . Irritable bowel syndrome   . 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 Myalgia and myositis, unspecified   . Osteoarthrosis, unspecified whether generalized or localized, unspecified site   . Personal history of unspecified circulatory disease   . Pure hypercholesterolemia   . Radial styloid tenosynovitis   . Unspecified essential hypertension   . Unspecified nonpsychotic mental disorder   . Unspecified pruritic disorder     Patient Active Problem List   Diagnosis Date Noted  . 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 History of gastroesophageal reflux (GERD) 04/19/2016  . History of hypertension 04/19/2016  . Other fatigue 03/21/2016  . Primary insomnia 03/21/2016  . History of gout 03/21/2016  . Osteoarthritis of lumbar spine  03/21/2016  . Dyslipidemia 03/21/2016  . History of IBS 03/21/2016  . History of depression 03/21/2016  . History of cholelithiasis 03/21/2016  . Pain management 03/21/2016  . Diabetes type 2, controlled ([place]) 01/09/2014  . Gout 09/20/2013  . OSA (obstructive sleep apnea) 09/16/2013  . Sleep disturbance 06/27/2013  . Chest pain, unspecified 06/06/2012  . Vaginal atrophy 03/15/2012  . Osteopenia 03/15/2012  . Post-menopausal 03/15/2012  . Vitamin D deficiency 11/04/2010  . Depression 10/20/2007  . CHRONIC PAIN SYNDROME 10/20/2007  . ALLERGIC RHINITIS 10/20/2007  . HYPERCHOLESTEROLEMIA 10/03/2007  . MITRAL VALVE PROLAPSE, HX OF 10/03/2007  . ANXIETY 04/20/2007  . ATTENTION DEFICIT DISORDER 04/20/2007  . Essential hypertension 04/20/2007  . GERD 04/20/2007  . IRRITABLE BOWEL SYNDROME 04/20/2007  . Primary osteoarthritis of both hands 04/20/2007  . DE QUERVAIN'S TENOSYNOVITIS 04/20/2007  . Fibromyalgia 04/20/2007  . DIVERTICULOSIS, COLON 01/09/2001    Past Surgical History:  Procedure Laterality Date  . ANKLE SURGERY Right   . APPENDECTOMY    . CARPAL TUNNEL RELEASE Right   . CERVICAL SPINE SURGERY     plates and pins  . CHOLECYSTECTOMY N/A 06/26/2015   Procedure: LAPAROSCOPIC CHOLECYSTECTOMY;  Surgeon: Ralene Ok, MD;  Location: [facility];  Service: General;  Laterality: N/A;  . COLONOSCOPY    . ESOPHAGOGASTRODUODENOSCOPY    . LAPAROSCOPIC APPENDECTOMY N/A 06/26/2015   Procedure: LAPAROSCOPIC APPENDECTOMY;  Surgeon: Ralene Ok, MD;  Location: [facility];  Service: General;  Laterality: N/A;  . ROTATOR CUFF REPAIR Right   .  SALPINGOOPHORECTOMY  Left   ectopic 1980    OB History    Gravida Para Term Preterm AB Living   1 0       0   SAB TAB Ectopic Multiple Live Births                   Home Medications    Prior to Admission medications   Medication Sig Start Date End Date Taking? Authorizing Provider  allopurinol (ZYLOPRIM) 300 MG tablet Take 1.5 tablets (450  mg total) by mouth daily. 11/30/16  Yes Panwala, Naitik, PA-C  amLODipine (NORVASC) 10 MG tablet TAKE 1 TABLET DAILY 10/21/13  Yes Debbrah Alar, NP  aspirin 81 MG tablet Take 81 mg by mouth daily.     Yes [provider]  baclofen (LIORESAL) 10 MG tablet Take 1 tablet (10 mg total) by mouth 3 (three) times daily. Patient taking differently: Take 10 mg by mouth 3 (three) times daily as needed for muscle spasms.  11/30/16 05/29/17 Yes Panwala, Naitik, PA-C  calcium carbonate (OSCAL) 1500 (600 Ca) MG TABS tablet Take 600 mg of elemental calcium by mouth daily with breakfast.   Yes [provider]  dicyclomine (BENTYL) 20 MG tablet Take 1 tablet (20 mg total) by mouth 3 (three) times daily as needed. 11/28/12  Yes Noralee Space, MD  DULoxetine (CYMBALTA) 60 MG capsule Take 60 mg by mouth 2 (two) times daily.  04/18/16  Yes [provider]  esomeprazole (NEXIUM) 20 MG capsule Take 20 mg by mouth daily at 12 noon.   Yes [provider]  gabapentin (NEURONTIN) 600 MG tablet Take 1 tablet (600 mg total) by mouth at bedtime. 11/30/16 02/28/17 Yes Panwala, Naitik, PA-C  guaiFENesin (MUCINEX) 600 MG 12 hr tablet Take 600 mg by mouth daily as needed for cough or to loosen phlegm.   Yes [provider]  hydrochlorothiazide (HYDRODIURIL) 25 MG tablet Take 1 tablet (25 mg total) by mouth daily. 12/18/13  Yes Debbrah Alar, NP  losartan (COZAAR) 100 MG tablet Take 1 tablet (100 mg total) by mouth daily. 05/08/12  Yes Noralee Space, MD  metoprolol succinate (TOPROL-XL) 100 MG 24 hr tablet TAKE 1 TABLET DAILY 10/21/13  Yes Debbrah Alar, NP  mometasone (NASONEX) 50 MCG/ACT nasal spray Place 2 sprays into the nose daily as needed (FOR ALLERGIES).   Yes [provider]  QUEtiapine (SEROQUEL) 300 MG tablet Take 300 mg by mouth at bedtime.    Yes [provider]  raloxifene (EVISTA) 60 MG tablet Take 60 mg by mouth daily.  05/11/15  Yes [provider]  Calcium Carbonate-Vit D-Min (CALCIUM 1200) 1200-1000 MG-UNIT CHEW Chew 1 tablet by mouth daily.    [provider]  Cholecalciferol (VITAMIN D) 2000 UNITS CAPS Take 2,000 Units by mouth daily.     [provider]  diclofenac sodium (VOLTAREN) 1 % GEL Voltaren Gel 3 grams to 3 large joints upto TID 3 TUBES with 3 refills 11/30/16   Panwala, Naitik, PA-C  fluticasone (FLONASE) 50 MCG/ACT nasal spray Place 2 sprays into both nostrils at bedtime. 10/21/13   Debbrah Alar, NP  HYDROcodone-acetaminophen (NORCO/VICODIN) 5-325 MG tablet Take 1 tablet by mouth at bedtime as needed for moderate pain. Patient not taking: Reported on 11/30/2016 09/07/16   Bo Merino, MD  lidocaine (XYLOCAINE) 5 % ointment Apply 1 application topically 3 (three) times daily as needed.    [provider]  naproxen (NAPROSYN) 500 MG tablet Take 500  mg by mouth 2 (two) times daily. 05/13/15   [provider]  omeprazole (PRILOSEC) 20 MG capsule TAKE 1 CAPSULE EVERY DAY Patient not taking: Reported on 11/30/2016 10/21/13   Debbrah Alar, NP    Family History Family History  Problem Relation Age of Onset  . Cirrhosis Father   . Cancer Maternal Grandmother        ?  . Lung disease Mother        ?   . Glaucoma Mother   . Glaucoma Brother   . Prostate cancer Brother   . Heart disease Sister   . Kidney disease Neg Hx   . Diabetes Neg Hx     Social History Social History  Substance Use Topics  . Smoking status: Never Smoker  . Smokeless tobacco: Never Used  . Alcohol use No     Allergies   Crestor [rosuvastatin calcium] and Asa [aspirin]   Review of Systems Review of Systems  Constitutional: Negative for fever.  HENT: Negative for congestion.   Eyes: Negative for redness.  Respiratory: Negative for shortness of breath.   Cardiovascular: Negative for chest pain.  Gastrointestinal: Positive for abdominal pain. Negative for blood in stool,  diarrhea and vomiting.  Genitourinary: Negative for dysuria, hematuria and vaginal bleeding.  Musculoskeletal: Negative for back pain.  Skin: Negative for rash.  Neurological: Negative for light-headedness.  Hematological: Does not bruise/bleed easily.  Psychiatric/Behavioral: Negative for confusion.     Physical Exam Updated Vital Signs BP (!) 179/98   Pulse 87   Temp 98.2 F (36.8 C) (Oral)   Resp 17   SpO2 99%   Physical Exam  Constitutional: She is oriented to person, place, and time. She appears well-developed and well-nourished. No distress.  HENT:  Head: Normocephalic and atraumatic.  Mouth/Throat: Oropharynx is clear and moist.  Eyes: Pupils are equal, round, and reactive to light. Conjunctivae and EOM are normal.  Neck: Normal range of motion. Neck supple.  Cardiovascular: Normal rate, regular rhythm and normal heart sounds.   Pulmonary/Chest: Effort normal and breath sounds normal. No respiratory distress.  Abdominal: Soft. Bowel sounds are normal. There is no tenderness.  Genitourinary:  Genitourinary Comments: A rectal exam was some external skin tags. No prolapsed internal hemorrhoids no anal fissure. No gross blood. No significant amount of stool in the rectal vault. Guaiac pending.  Musculoskeletal: Normal range of motion.  Neurological: She is alert and oriented to person, place, and time. No cranial nerve deficit or sensory deficit. She exhibits normal muscle tone. Coordination normal.  Skin: Skin is warm.  Nursing note and vitals reviewed.    ED Treatments / Results  Labs (all labs ordered are listed, but only abnormal results are displayed) Labs Reviewed  BASIC METABOLIC PANEL - Abnormal; Notable for the following:       Result Value   Glucose, Bld 115 (*)    All other components within normal limits  CBC - Abnormal; Notable for the following:    RBC 3.15 (*)    Hemoglobin 6.7 (*)    HCT 24.0 (*)    MCV 76.2 (*)    MCH 21.3 (*)    MCHC 27.9 (*)      RDW 17.4 (*)    All other components within normal limits  CBG MONITORING, ED  TYPE AND SCREEN  ABO/RH  PREPARE RBC (CROSSMATCH)    EKG  EKG Interpretation None       Radiology Ct Abdomen Pelvis W Contrast  Result Date: 12/03/2016  CLINICAL DATA:  Low hemoglobin. Dark stools. Distended abdomen with generalized tiredness. EXAM: CT ABDOMEN AND PELVIS WITH CONTRAST TECHNIQUE: Multidetector CT imaging of the abdomen and pelvis was performed using the standard protocol following bolus administration of intravenous contrast. CONTRAST:  116mL ISOVUE-300 IOPAMIDOL (ISOVUE-300) INJECTION 61% COMPARISON:  CT, 05/12/2015 FINDINGS: Lower chest: Lung bases essentially clear.  Heart normal in size. Hepatobiliary: No focal liver abnormality is seen. Status post cholecystectomy. No biliary dilatation. Pancreas: Unremarkable. No pancreatic ductal dilatation or surrounding inflammatory changes. Spleen: Normal in size without focal abnormality. Adrenals/Urinary Tract: Adrenal glands are unremarkable. Kidneys are normal, without renal calculi, focal lesion, or hydronephrosis. Bladder is unremarkable. Stomach/Bowel: Stomach and small bowel unremarkable. There scattered colonic diverticula. No diverticulitis. Colon otherwise unremarkable. Vascular/Lymphatic: Aortic atherosclerosis. No enlarged abdominal or pelvic lymph nodes. Reproductive: Several uterine calcifications likely in small fibroids. Uterus otherwise unremarkable. Calcification along the posterior margin of a normal size right ovary. No adnexal masses. Other: No abdominal wall hernia or abnormality. No abdominopelvic ascites. Musculoskeletal: No fracture or acute finding. No osteoblastic or osteolytic lesions. IMPRESSION: 1. No acute findings. 2. Colonic diverticulum.  No other bowel abnormality. 3. Status post cholecystectomy. 4. Aortic atherosclerosis. Electronically Signed   By: Lajean Manes M.D.   On: 12/03/2016 12:55    Procedures Procedures  (including critical care time)  Medications Ordered in ED Medications  0.9 %  sodium chloride infusion (not administered)  0.9 %  sodium chloride infusion (not administered)  sodium chloride 0.9 % bolus 250 mL (250 mLs Intravenous New Bag/Given 12/03/16 1207)  iopamidol (ISOVUE-300) 61 % injection (100 mLs  Contrast Given 12/03/16 1225)     Initial Impression / Assessment and Plan / ED Course  I have reviewed the triage vital signs and the nursing notes.  Pertinent labs & imaging results that were available during my care of the patient were reviewed by me and considered in my medical decision making (see chart for details).    Patient hemodynamically stable here. CT of abdomen only shows diverticulum. No other acute findings. Hemoglobin in the 6 range here 2 units of packed red blood cells ordered. For transfusion. Discussed with hospitalist they will admit. Guaiac is still pending but no gross blood on rectal exam. Still clinically suspicious for slow blood loss secondary to GI source. Hospitalist requested blood smear be put aside order placed. They will admit.   Final Clinical Impressions(s) / ED Diagnoses   Final diagnoses:  Anemia, unspecified type    New Prescriptions New Prescriptions   No medications on file     Fredia Sorrow, MD 12/03/16 1407

## 2016-12-03 NOTE — ED Notes (Signed)
Patient transported to CT 

## 2016-12-04 ENCOUNTER — Observation Stay (HOSPITAL_COMMUNITY): Payer: PPO

## 2016-12-04 ENCOUNTER — Encounter (HOSPITAL_COMMUNITY)
Admission: EM | Disposition: A | Discharge status: Home / Self Care | Payer: Self-pay | Source: Home / Self Care | Attending: Internal Medicine

## 2016-12-04 ENCOUNTER — Encounter (HOSPITAL_COMMUNITY): Payer: Self-pay | Admitting: *Deleted

## 2016-12-04 DIAGNOSIS — E559 Vitamin D deficiency, unspecified: Secondary | ICD-10-CM | POA: Diagnosis present

## 2016-12-04 DIAGNOSIS — K589 Irritable bowel syndrome without diarrhea: Secondary | ICD-10-CM | POA: Diagnosis not present

## 2016-12-04 DIAGNOSIS — K449 Diaphragmatic hernia without obstruction or gangrene: Secondary | ICD-10-CM | POA: Diagnosis present

## 2016-12-04 DIAGNOSIS — F411 Generalized anxiety disorder: Secondary | ICD-10-CM | POA: Diagnosis present

## 2016-12-04 DIAGNOSIS — D509 Iron deficiency anemia, unspecified: Principal | ICD-10-CM

## 2016-12-04 DIAGNOSIS — Z9049 Acquired absence of other specified parts of digestive tract: Secondary | ICD-10-CM | POA: Diagnosis not present

## 2016-12-04 DIAGNOSIS — F988 Other specified behavioral and emotional disorders with onset usually occurring in childhood and adolescence: Secondary | ICD-10-CM | POA: Diagnosis present

## 2016-12-04 DIAGNOSIS — E785 Hyperlipidemia, unspecified: Secondary | ICD-10-CM | POA: Diagnosis present

## 2016-12-04 DIAGNOSIS — M109 Gout, unspecified: Secondary | ICD-10-CM | POA: Diagnosis present

## 2016-12-04 DIAGNOSIS — F329 Major depressive disorder, single episode, unspecified: Secondary | ICD-10-CM | POA: Diagnosis present

## 2016-12-04 DIAGNOSIS — E119 Type 2 diabetes mellitus without complications: Secondary | ICD-10-CM | POA: Diagnosis present

## 2016-12-04 DIAGNOSIS — K2270 Barrett's esophagus without dysplasia: Secondary | ICD-10-CM | POA: Diagnosis present

## 2016-12-04 DIAGNOSIS — K633 Ulcer of intestine: Secondary | ICD-10-CM | POA: Diagnosis present

## 2016-12-04 DIAGNOSIS — Z23 Encounter for immunization: Secondary | ICD-10-CM | POA: Diagnosis present

## 2016-12-04 DIAGNOSIS — D649 Anemia, unspecified: Secondary | ICD-10-CM | POA: Diagnosis present

## 2016-12-04 DIAGNOSIS — R195 Other fecal abnormalities: Secondary | ICD-10-CM | POA: Diagnosis not present

## 2016-12-04 DIAGNOSIS — G894 Chronic pain syndrome: Secondary | ICD-10-CM | POA: Diagnosis present

## 2016-12-04 DIAGNOSIS — I10 Essential (primary) hypertension: Secondary | ICD-10-CM | POA: Diagnosis present

## 2016-12-04 DIAGNOSIS — M797 Fibromyalgia: Secondary | ICD-10-CM | POA: Diagnosis present

## 2016-12-04 DIAGNOSIS — J309 Allergic rhinitis, unspecified: Secondary | ICD-10-CM | POA: Diagnosis present

## 2016-12-04 DIAGNOSIS — K5730 Diverticulosis of large intestine without perforation or abscess without bleeding: Secondary | ICD-10-CM | POA: Diagnosis present

## 2016-12-04 DIAGNOSIS — Z8679 Personal history of other diseases of the circulatory system: Secondary | ICD-10-CM | POA: Diagnosis not present

## 2016-12-04 DIAGNOSIS — R05 Cough: Secondary | ICD-10-CM | POA: Diagnosis not present

## 2016-12-04 DIAGNOSIS — K580 Irritable bowel syndrome with diarrhea: Secondary | ICD-10-CM | POA: Diagnosis present

## 2016-12-04 DIAGNOSIS — M19042 Primary osteoarthritis, left hand: Secondary | ICD-10-CM | POA: Diagnosis present

## 2016-12-04 DIAGNOSIS — K219 Gastro-esophageal reflux disease without esophagitis: Secondary | ICD-10-CM | POA: Diagnosis present

## 2016-12-04 DIAGNOSIS — M19041 Primary osteoarthritis, right hand: Secondary | ICD-10-CM | POA: Diagnosis present

## 2016-12-04 DIAGNOSIS — E7800 Pure hypercholesterolemia, unspecified: Secondary | ICD-10-CM | POA: Diagnosis present

## 2016-12-04 HISTORY — PX: ESOPHAGOGASTRODUODENOSCOPY: SHX5428

## 2016-12-04 LAB — COMPREHENSIVE METABOLIC PANEL
ALBUMIN: 3.4 g/dL — AB (ref 3.5–5.0)
ALT: 25 U/L (ref 14–54)
ANION GAP: 7 (ref 5–15)
AST: 28 U/L (ref 15–41)
Alkaline Phosphatase: 65 U/L (ref 38–126)
BILIRUBIN TOTAL: 0.8 mg/dL (ref 0.3–1.2)
BUN: 10 mg/dL (ref 6–20)
CO2: 28 mmol/L (ref 22–32)
Calcium: 9.3 mg/dL (ref 8.9–10.3)
Chloride: 105 mmol/L (ref 101–111)
Creatinine, Ser: 0.75 mg/dL (ref 0.44–1.00)
GFR calc non Af Amer: >60 mL/min (ref >60)
GLUCOSE: 104 mg/dL — AB (ref 65–99)
POTASSIUM: 3.9 mmol/L (ref 3.5–5.1)
SODIUM: 140 mmol/L (ref 135–145)
TOTAL PROTEIN: 6.5 g/dL (ref 6.5–8.1)

## 2016-12-04 LAB — BPAM RBC
BLOOD PRODUCT EXPIRATION DATE: 201810182359
BLOOD PRODUCT EXPIRATION DATE: 201810182359
ISSUE DATE / TIME: 201810061812
ISSUE DATE / TIME: 201810061420
Unit Type and Rh: 6200
Unit Type and Rh: 6200

## 2016-12-04 LAB — IRON AND TIBC
Iron: 115 ug/dL (ref 28–170)
Saturation Ratios: 21 % (ref 10.4–31.8)
TIBC: 550 ug/dL — ABNORMAL HIGH (ref 250–450)
UIBC: 435 ug/dL

## 2016-12-04 LAB — URINALYSIS, ROUTINE W REFLEX MICROSCOPIC
BILIRUBIN URINE: NEGATIVE
Glucose, UA: NEGATIVE mg/dL
Hgb urine dipstick: NEGATIVE
KETONES UR: NEGATIVE mg/dL
Leukocytes, UA: NEGATIVE
NITRITE: NEGATIVE
Protein, ur: NEGATIVE mg/dL
Specific Gravity, Urine: 1.008 (ref 1.005–1.030)
pH: 7 (ref 5.0–8.0)

## 2016-12-04 LAB — CBC
HCT: 28.9 % — ABNORMAL LOW (ref 36.0–46.0)
Hemoglobin: 8.7 g/dL — ABNORMAL LOW (ref 12.0–15.0)
MCH: 23.5 pg — AB (ref 26.0–34.0)
MCHC: 30.1 g/dL (ref 30.0–36.0)
MCV: 77.9 fL — ABNORMAL LOW (ref 78.0–100.0)
PLATELETS: 340 10*3/uL (ref 150–400)
RBC: 3.71 MIL/uL — ABNORMAL LOW (ref 3.87–5.11)
RDW: 17.2 % — ABNORMAL HIGH (ref 11.5–15.5)
WBC: 6.9 10*3/uL (ref 4.0–10.5)

## 2016-12-04 LAB — TYPE AND SCREEN
ABO/RH(D): A POS
ANTIBODY SCREEN: NEGATIVE
UNIT DIVISION: 0
Unit division: 0

## 2016-12-04 LAB — TSH: TSH: 2.253 u[IU]/mL (ref 0.350–4.500)

## 2016-12-04 LAB — GLUCOSE, CAPILLARY
GLUCOSE-CAPILLARY: 112 mg/dL — AB (ref 65–99)
Glucose-Capillary: 116 mg/dL — ABNORMAL HIGH (ref 65–99)

## 2016-12-04 LAB — FOLATE: Folate: 23.3 ng/mL (ref >5.9)

## 2016-12-04 LAB — FERRITIN: FERRITIN: 8 ng/mL — AB (ref 11–307)

## 2016-12-04 LAB — VITAMIN B12: Vitamin B-12: 337 pg/mL (ref 180–914)

## 2016-12-04 SURGERY — EGD (ESOPHAGOGASTRODUODENOSCOPY)
Anesthesia: Moderate Sedation

## 2016-12-04 MED ORDER — DIPHENHYDRAMINE HCL 50 MG/ML IJ SOLN
INTRAMUSCULAR | Status: AC
  Filled 2016-12-04: qty 1

## 2016-12-04 MED ORDER — BUTAMBEN-TETRACAINE-BENZOCAINE 2-2-14 % EX AERO
INHALATION_SPRAY | CUTANEOUS | Status: DC | PRN
  Administered 2016-12-04: 2 via TOPICAL

## 2016-12-04 MED ORDER — SPOT INK MARKER SYRINGE KIT
PACK | SUBMUCOSAL | Status: AC
  Filled 2016-12-04: qty 5

## 2016-12-04 MED ORDER — FENTANYL CITRATE (PF) 100 MCG/2ML IJ SOLN
INTRAMUSCULAR | Status: DC | PRN
  Administered 2016-12-04 (×2): 25 ug via INTRAVENOUS

## 2016-12-04 MED ORDER — PANTOPRAZOLE SODIUM 40 MG PO TBEC
40.0000 mg | DELAYED_RELEASE_TABLET | Freq: Every day | ORAL | Status: DC
  Administered 2016-12-04 – 2016-12-05 (×2): 40 mg via ORAL
  Filled 2016-12-04 (×2): qty 1

## 2016-12-04 MED ORDER — MIDAZOLAM HCL 10 MG/2ML IJ SOLN
INTRAMUSCULAR | Status: DC | PRN
  Administered 2016-12-04: 1 mg via INTRAVENOUS
  Administered 2016-12-04 (×2): 2 mg via INTRAVENOUS

## 2016-12-04 MED ORDER — FENTANYL CITRATE (PF) 100 MCG/2ML IJ SOLN
INTRAMUSCULAR | Status: AC
  Filled 2016-12-04: qty 4

## 2016-12-04 MED ORDER — MIDAZOLAM HCL 5 MG/ML IJ SOLN
INTRAMUSCULAR | Status: AC
  Filled 2016-12-04: qty 3

## 2016-12-04 MED ORDER — EPINEPHRINE PF 1 MG/10ML IJ SOSY
PREFILLED_SYRINGE | INTRAMUSCULAR | Status: AC
  Filled 2016-12-04: qty 10

## 2016-12-04 MED ORDER — DICYCLOMINE HCL 10 MG PO CAPS
20.0000 mg | ORAL_CAPSULE | Freq: Three times a day (TID) | ORAL | Status: DC
  Administered 2016-12-04 – 2016-12-05 (×3): 20 mg via ORAL
  Filled 2016-12-04 (×3): qty 2

## 2016-12-04 NOTE — H&P (View-Only) (Signed)
Referring Provider: Internal Medicine Teaching Service Primary Care Physician:  Maury Dus, MD Primary Gastroenterologist: Silvano Rusk,  MD  Reason for Consultation: GI bleed / anemia    Attending physician's note   I have taken a history, examined the patient and reviewed the chart. I agree with the Advanced Practitioner's note, impression and recommendations. Severe iron deficiency anemia with a history of dark stools, not definitely melena, stool is back to brown and is heme negative on admission. R/O ulcer, AVM, IC valve ulcer. IBS-D with postprandial loose stools with urgency. Add Dicyclomine 10 mg tid ac. Maintain Hb > 7. IV iron replacement in hospital. EGD today.   [name], MD [name] [phone] Mon-Fri 8a-5p [phone] after 5p, weekends, holidays   ASSESSMENT AND PLAN:   52. 66 yo female with severe iron deficiency anemia. Hgb in March was 10.6, down to 7.3 yesterday. She had very dark stools last week but stools back to normal now. Rule out PUD, AVMs. 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  -patient needs EGD. The risks and benefits of EGD were discussed and the patient agrees to proceed. Keep NPO -continue BID PPI -monitor hgb, transfuse as needed. She has received 2 units of blood thus far. Hgb 8.7 this am -If EGD is negative we may need to repeat colonoscopy as she is due for repeat soon anyway.   2. 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 For repeat in December 2018  3. HTN, Gout  4. ? Barrett's esophagus, not present on EGD with bx 2008  5. Irritable bowel syndrome.         HPI: Tammy Mullins is a 66 y.o. female who presented to ED after her Rheumatologist found her severely anemia by labs. Patient has been so tired lately and craving ice. No chest pains or SOB. Last week she noticed black stools on several occassions. She takes Pepto sometimes but none prior to the black stools. She takes Nexium at home. No NSAIDs. No  abdominal pain but some bloating. No nausea / vomiting. She has chronic intermittent loose stools with hx of IBS.  Ba  Past Medical History:  Diagnosis Date  . Allergic rhinitis, cause unspecified   . Anxiety state, unspecified   . Arthritis   . Attention deficit disorder without mention of hyperactivity   . Barrett's esophagus   . Chronic pain syndrome   . 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 Depression   . Diabetes type 2, controlled ([place]) 01/09/2014  . Diverticulosis of colon (without mention of hemorrhage)   . Esophageal reflux   . Fibromyalgia   . Gout, unspecified   . Hiatal hernia   . Irritable bowel syndrome   . 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 Myalgia and myositis, unspecified   . Osteoarthrosis, unspecified whether generalized or localized, unspecified site   . Personal history of unspecified circulatory disease   . Pure hypercholesterolemia   . Radial styloid tenosynovitis   . Unspecified essential hypertension   . Unspecified nonpsychotic mental disorder   . Unspecified pruritic disorder     Past Surgical History:  Procedure Laterality Date  . ANKLE SURGERY Right   . APPENDECTOMY    . CARPAL TUNNEL RELEASE Right   . CERVICAL SPINE SURGERY     plates and pins  . CHOLECYSTECTOMY N/A 06/26/2015   Procedure: LAPAROSCOPIC CHOLECYSTECTOMY;  Surgeon: Ralene Ok, MD;  Location: [facility];  Service: General;  Laterality: N/A;  . COLONOSCOPY    . ESOPHAGOGASTRODUODENOSCOPY    . LAPAROSCOPIC APPENDECTOMY N/A 06/26/2015   Procedure: LAPAROSCOPIC APPENDECTOMY;  Surgeon: Ralene Ok, MD;  Location: [facility] [facility];  Service: General;  Laterality: N/A;  . ROTATOR CUFF REPAIR Right   . SALPINGOOPHORECTOMY  Left   ectopic 1980    Prior to Admission medications   Medication Sig Start Date End Date Taking? Authorizing Provider  allopurinol (ZYLOPRIM) 300 MG tablet Take 1.5 tablets (450 mg total) by mouth daily. 11/30/16  Yes Panwala, Naitik, PA-C  amLODipine  (NORVASC) 10 MG tablet TAKE 1 TABLET DAILY 10/21/13  Yes Debbrah Alar, NP  aspirin 81 MG tablet Take 81 mg by mouth daily.     Yes [provider]  baclofen (LIORESAL) 10 MG tablet Take 1 tablet (10 mg total) by mouth 3 (three) times daily. Patient taking differently: Take 10 mg by mouth 3 (three) times daily as needed for muscle spasms.  11/30/16 05/29/17 Yes Panwala, Naitik, PA-C  calcium carbonate (OSCAL) 1500 (600 Ca) MG TABS tablet Take 600 mg of elemental calcium by mouth daily with breakfast.   Yes [provider]  dicyclomine (BENTYL) 20 MG tablet Take 1 tablet (20 mg total) by mouth 3 (three) times daily as needed. 11/28/12  Yes Noralee Space, MD  DULoxetine (CYMBALTA) 60 MG capsule Take 60 mg by mouth 2 (two) times daily.  04/18/16  Yes [provider]  esomeprazole (NEXIUM) 20 MG capsule Take 20 mg by mouth daily at 12 noon.   Yes [provider]  gabapentin (NEURONTIN) 600 MG tablet Take 1 tablet (600 mg total) by mouth at bedtime. 11/30/16 02/28/17 Yes Panwala, Naitik, PA-C  guaiFENesin (MUCINEX) 600 MG 12 hr tablet Take 600 mg by mouth daily as needed for cough or to loosen phlegm.   Yes [provider]  hydrochlorothiazide (HYDRODIURIL) 25 MG tablet Take 1 tablet (25 mg total) by mouth daily. 12/18/13  Yes Debbrah Alar, NP  losartan (COZAAR) 100 MG tablet Take 1 tablet (100 mg total) by mouth daily. 05/08/12  Yes Noralee Space, MD  metoprolol succinate (TOPROL-XL) 100 MG 24 hr tablet TAKE 1 TABLET DAILY 10/21/13  Yes Debbrah Alar, NP  mometasone (NASONEX) 50 MCG/ACT nasal spray Place 2 sprays into the nose daily as needed (FOR ALLERGIES).   Yes [provider]  QUEtiapine (SEROQUEL) 300 MG tablet Take 300 mg by mouth at bedtime.    Yes [provider]  raloxifene (EVISTA) 60 MG tablet Take 60 mg by mouth daily.  05/11/15  Yes [provider]  Calcium Carbonate-Vit D-Min (CALCIUM 1200) 1200-1000 MG-UNIT  CHEW Chew 1 tablet by mouth daily.    [provider]  Cholecalciferol (VITAMIN D) 2000 UNITS CAPS Take 2,000 Units by mouth daily.     [provider]  diclofenac sodium (VOLTAREN) 1 % GEL Voltaren Gel 3 grams to 3 large joints upto TID 3 TUBES with 3 refills 11/30/16   Panwala, Naitik, PA-C  fluticasone (FLONASE) 50 MCG/ACT nasal spray Place 2 sprays into both nostrils at bedtime. 10/21/13   Debbrah Alar, NP  HYDROcodone-acetaminophen (NORCO/VICODIN) 5-325 MG tablet Take 1 tablet by mouth at bedtime as needed for moderate pain. Patient not taking: Reported on 11/30/2016 09/07/16   Bo Merino, MD  lidocaine (XYLOCAINE) 5 % ointment Apply 1 application topically 3 (three) times daily as needed.    [provider]  naproxen (NAPROSYN) 500 MG tablet Take 500 mg by mouth 2 (two) times  daily. 05/13/15   [provider]  omeprazole (PRILOSEC) 20 MG capsule TAKE 1 CAPSULE EVERY DAY Patient not taking: Reported on 11/30/2016 10/21/13   Debbrah Alar, NP    Current Facility-Administered Medications  Medication Dose Route Frequency Provider Last Rate Last Dose  . 0.9 %  sodium chloride infusion  10 mL/hr Intravenous Once Fredia Sorrow, MD   Stopped at 12/04/16 0234  . 0.9 %  sodium chloride infusion   Intravenous Continuous Rondel Jumbo, PA-C 100 mL/hr at 12/04/16 7425    . acetaminophen (TYLENOL) tablet 650 mg  650 mg Oral Q6H PRN Rondel Jumbo, PA-C       Or  . acetaminophen (TYLENOL) suppository 650 mg  650 mg Rectal Q6H PRN Rondel Jumbo, PA-C      . allopurinol (ZYLOPRIM) tablet 450 mg  450 mg Oral Daily Rondel Jumbo, PA-C   450 mg at 12/03/16 1919  . amLODipine (NORVASC) tablet 10 mg  10 mg Oral Daily Rondel Jumbo, PA-C   10 mg at 12/04/16 0859  . baclofen (LIORESAL) tablet 10 mg  10 mg Oral TID PRN Rondel Jumbo, PA-C      . bisacodyl (DULCOLAX) suppository 10 mg  10 mg Rectal Daily PRN Rondel Jumbo, PA-C      . calcium  carbonate (OS-CAL - dosed in mg of elemental calcium) tablet 500 mg of elemental calcium  1 tablet Oral Q breakfast Sinclair, Emily S, RPH      . dicyclomine (BENTYL) tablet 20 mg  20 mg Oral TID PRN Rondel Jumbo, PA-C      . DULoxetine (CYMBALTA) DR capsule 60 mg  60 mg Oral BID Rondel Jumbo, PA-C   60 mg at 12/03/16 2101  . fluticasone (FLONASE) 50 MCG/ACT nasal spray 1 spray  1 spray Each Nare Daily Rondel Jumbo, PA-C      . gabapentin (NEURONTIN) tablet 600 mg  600 mg Oral QHS Rondel Jumbo, PA-C   600 mg at 12/03/16 2058  . guaiFENesin (MUCINEX) 12 hr tablet 600 mg  600 mg Oral Daily PRN Rondel Jumbo, PA-C      . hydrochlorothiazide (HYDRODIURIL) tablet 25 mg  25 mg Oral Daily Rondel Jumbo, PA-C   25 mg at 12/03/16 1909  . Influenza vac split quadrivalent PF (FLUZONE HIGH-DOSE) injection 0.5 mL  0.5 mL Intramuscular Tomorrow-1000 Georgette Shell, MD      . insulin aspart (novoLOG) injection 0-9 Units  0-9 Units Subcutaneous TID WC Wertman, Coralee Pesa, PA-C      . losartan (COZAAR) tablet 100 mg  100 mg Oral Daily Rondel Jumbo, PA-C   100 mg at 12/03/16 1908  . metoprolol succinate (TOPROL-XL) 24 hr tablet 100 mg  100 mg Oral Daily Sharene Butters E, PA-C   100 mg at 12/04/16 0900  . ondansetron (ZOFRAN) tablet 4 mg  4 mg Oral Q6H PRN Rondel Jumbo, PA-C       Or  . ondansetron (ZOFRAN) injection 4 mg  4 mg Intravenous Q6H PRN Rondel Jumbo, PA-C      . pantoprazole (PROTONIX) injection 40 mg  40 mg Intravenous Q12H Rondel Jumbo, PA-C   40 mg at 12/03/16 2123  . QUEtiapine (SEROQUEL) tablet 300 mg  300 mg Oral QHS Rondel Jumbo, PA-C   300 mg at 12/03/16 2058  . raloxifene (EVISTA) tablet 60 mg  60 mg Oral Daily Rondel Jumbo, PA-C  60 mg at 12/03/16 1920  . senna-docusate (Senokot-S) tablet 1 tablet  1 tablet Oral QHS PRN Rondel Jumbo, PA-C        Allergies as of 12/03/2016 - Review Complete 12/03/2016  Allergen Reaction Noted  . Crestor [rosuvastatin  calcium] Other (See Comments) 11/04/2010  . Asa [aspirin]  06/30/2016    Family History  Problem Relation Age of Onset  . Cirrhosis Father   . Cancer Maternal Grandmother        ?  . Lung disease Mother        ?   . Glaucoma Mother   . Glaucoma Brother   . Prostate cancer Brother   . Heart disease Sister   . Kidney disease Neg Hx   . Diabetes Neg Hx     Social History   Social History  . Marital status: Widowed    Spouse name: N/A  . Number of children: 0  . Years of education: N/A   Occupational History  . retired    Social History Main Topics  . Smoking status: Never Smoker  . Smokeless tobacco: Never Used  . Alcohol use No  . Drug use: No  . Sexual activity: No   Other Topics Concern  . 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  [place]          Review of Systems: All systems reviewed and negative except where noted in HPI.  Physical Exam: Vital signs in last 24 hours: Temp:  [98.1 F (36.7 C)-98.7 F (37.1 C)] 98.5 F (36.9 C) (10/07 0531) Pulse Rate:  [68-101] 70 (10/07 0531) Resp:  [11-20] 18 (10/07 0531) BP: (139-186)/(64-98) 147/71 (10/07 0531) SpO2:  [95 %-100 %] 95 % (10/07 0531) Weight:  [188 lb 1.6 oz (85.3 kg)-189 lb 14.4 oz (86.1 kg)] 188 lb 1.6 oz (85.3 kg) (10/07 0531) Last BM Date: 12/03/16 General:   Alert, well-developed,  Black female in NAD Psych:  Pleasant, cooperative. Normal mood and affect. Eyes:  Pupils equal, sclera clear, no icterus.   Conjunctiva pale Ears:  Normal auditory acuity. Nose:  No deformity, discharge,  or lesions. Neck:  Supple; no masses Lungs:  Clear throughout to auscultation.   No wheezes, crackles, or rhonchi.  Heart:   Regular rate and rhythm; no murmurs, no edema Abdomen:  Soft, non-distended, nontender, BS active, no palp mass    Rectal:  Deferred  Msk:  Symmetrical without gross deformities. . Pulses:  Normal pulses noted. Neurologic:  Alert and  oriented x4;  grossly normal neurologically. Skin:  Intact without significant lesions or rashes..   Intake/Output from previous day: 10/06 0701 - 10/07 0700 In: 1919.7 [I.V.:596.7; Blood:1073; IV Piggyback:250] Out: 400 [Urine:400] Intake/Output this shift: No intake/output data recorded.  Lab Results:  Recent Labs  12/03/16 0950 12/04/16 0133  WBC 7.1 6.9  HGB 6.7* 8.7*  HCT 24.0* 28.9*  PLT 397 340   BMET  Recent Labs  12/03/16 0950 12/04/16 0133  NA 139 140  K 4.0 3.9  CL 105 105  CO2 26 28  GLUCOSE 115* 104*  BUN 15 10  CREATININE 0.82 0.75  CALCIUM 9.5 9.3   LFT  Recent Labs  12/04/16 0133  PROT 6.5  ALBUMIN 3.4*  AST 28  ALT 25  ALKPHOS 65  BILITOT 0.8   PT/INR  Recent Labs  12/03/16 2252  LABPROT 13.0  INR 0.99    Studies/Results: X-ray Chest Pa And Lateral  Result Date: 12/04/2016 CLINICAL DATA:  66 year old female with anemia and cough EXAM: CHEST  2 VIEW COMPARISON:  Prior chest x-ray 06/08/2012 FINDINGS: The lungs are clear and negative for focal airspace consolidation, pulmonary edema or suspicious pulmonary nodule. No pleural effusion or pneumothorax. Cardiac and mediastinal contours are within normal limits. No acute fracture or lytic or blastic osseous lesions. The visualized upper abdominal bowel gas pattern is unremarkable. Incompletely imaged cervical spine stabilization hardware. IMPRESSION: Negative chest x-ray. Electronically Signed   By: Jacqulynn Cadet M.D.   On: 12/04/2016 08:53   Ct Abdomen Pelvis W Contrast  Result Date: 12/03/2016 CLINICAL DATA:  Low hemoglobin. Dark stools. Distended abdomen with generalized tiredness. EXAM: CT ABDOMEN AND PELVIS WITH CONTRAST TECHNIQUE: Multidetector  CT imaging of the abdomen and pelvis was performed using the standard protocol following bolus administration of intravenous contrast. CONTRAST:  181mL ISOVUE-300 IOPAMIDOL (ISOVUE-300) INJECTION 61% COMPARISON:  CT, 05/12/2015 FINDINGS: Lower chest: Lung bases essentially clear.  Heart normal in size. Hepatobiliary: No focal liver abnormality is seen. Status post cholecystectomy. No biliary dilatation. Pancreas: Unremarkable. No pancreatic ductal dilatation or surrounding inflammatory changes. Spleen: Normal in size without focal abnormality. Adrenals/Urinary Tract: Adrenal glands are unremarkable. Kidneys are normal, without renal calculi, focal lesion, or hydronephrosis. Bladder is unremarkable. Stomach/Bowel: Stomach and small bowel unremarkable. There scattered colonic diverticula. No diverticulitis. Colon otherwise unremarkable. Vascular/Lymphatic: Aortic atherosclerosis. No enlarged abdominal or pelvic lymph nodes. Reproductive: Several uterine calcifications likely in small fibroids. Uterus otherwise unremarkable. Calcification along the posterior margin of a normal size right ovary. No adnexal masses. Other: No abdominal wall hernia or abnormality. No abdominopelvic ascites. Musculoskeletal: No fracture or acute finding. No osteoblastic or osteolytic lesions. IMPRESSION: 1. No acute findings. 2. Colonic diverticulum.  No other bowel abnormality. 3. Status post cholecystectomy. 4. Aortic atherosclerosis. Electronically Signed   By: Lajean Manes M.D.   On: 12/03/2016 12:55    Tye Savoy, NP-C @  12/04/2016, 9:01 AM  Pager number [phone]

## 2016-12-04 NOTE — Progress Notes (Signed)
PROGRESS NOTE    Tammy Mullins  FAO:[id number] DOB: 1951/02/08 DOA: 12/03/2016 PCP: Maury Dus, MD   Brief Narrative: Tammy Mullins is a 66 y.o. female with medical history significant of IBS, history of peptic ulcer in May 2018 patient had a colonoscopy showing some erosion in the colon apparently, came in with complaints of abdominal discomfort and dark black stools. Her hemoglobin was 11.8 in April 2017 came down to 10.6 in March 2018, 7.3 in October 2018 down to 6.7 today. She denies any nausea or vomiting or weight loss. She takes naproxen 500 mg twice a day at home. She denies any fevers chills chest pain shortness of breath no nausea vomiting headache changes within vision or urinary complaints at this time. Denies any hematuria hematemesis shortness of breath cough at this time.  ED Course: She was found to have a hemoglobin of 6.7 she was started on blood transfusion. 2 sets of blood transfusion is ordered she is on the first unit at this time. Fecal occult blood test was negative. Appreciate GI eval.patient had a bm today brown color.no new co.she feels better.received 2 units of blood transfusion.hb 8.7 today.  Assessment & Plan:   Active Problems:   HYPERCHOLESTEROLEMIA   Anxiety state   Depression   Attention deficit disorder   Chronic pain syndrome   Essential hypertension   Allergic rhinitis   GERD   Diverticulosis of colon   Irritable bowel syndrome   Primary osteoarthritis of both hands   Fibromyalgia   History of cardiovascular disorder   Vitamin D deficiency   History of gout   Dyslipidemia   Pain management   History of gastroesophageal reflux (GERD)   Colon ulcer - IC valve   Anemia    DVT prophylaxis:  Code Status:  Family Communication:  Disposition Plan:   Upper GI bleed with hemoglobin 6.7. though F OBT is negative with a history of ulcer disease and NSAID use patient probably has slow GI bleed. 2 units of blood to be transfused. We'll  start her on Protonix 40 mg twice a day. Avoid any further NSAID use. IV fluids. GI consult has been called. Patient will be nothing by mouth except medications.  Anemia due to blood loss anemia workup has been sent prior to blood transfusion. PT PTT INR will be added.  Gout continue colchicine  Hypertension we will continue Norvasc, Cozaar, and Toprol.hctz on Hold. Hydrochlorothiazide.  IBS continue Bentyl.  Depression -continue cymbalta.  Consultants:  gi  Procedures:   Antimicrobials:none   Subjective:resing in bed.denies any new complaints.   Objective: Vitals:   12/03/16 1813 12/03/16 1837 12/03/16 2124 12/04/16 0531  BP: (!) 148/80 139/64 (!) 149/78 (!) 147/71  Pulse: 75 80 68 70  Resp:  18 18 18   Temp: 98.1 F (36.7 C) 98.4 F (36.9 C) 98.3 F (36.8 C) 98.5 F (36.9 C)  TempSrc: Oral Oral Oral   SpO2: 98% 98% 98% 95%  Weight:    85.3 kg (188 lb 1.6 oz)  Height:        Intake/Output Summary (Last 24 hours) at 12/04/16 1116 Last data filed at 12/04/16 0500  Gross per 24 hour  Intake          1919.67 ml  Output              400 ml  Net          1519.67 ml   Filed Weights   12/03/16 1555 12/04/16 0531  Weight: 86.1 kg (  189 lb 14.4 oz) 85.3 kg (188 lb 1.6 oz)    Examination:  General exam: Appears calm and comfortable  Respiratory system: Clear to auscultation. Respiratory effort normal. Cardiovascular system: S1 & S2 heard, RRR. No JVD, murmurs, rubs, gallops or clicks. No pedal edema. Gastrointestinal system: Abdomen is nondistended, soft and nontender. No organomegaly or masses felt. Normal bowel sounds heard. Central nervous system: Alert and oriented. No focal neurological deficits. Extremities: Symmetric 5 x 5 power. Skin: No rashes, lesions or ulcers Psychiatry: Judgement and insight appear normal. Mood & affect appropriate.     Data Reviewed: I have personally reviewed following labs and imaging studies  CBC:  Recent Labs Lab  11/30/16 1010 12/03/16 0950 12/04/16 0133  WBC 15.3* 7.1 6.9  NEUTROABS 12,179*  --   --   HGB 7.3* 6.7* 8.7*  HCT 25.5* 24.0* 28.9*  MCV 76.6* 76.2* 77.9*  PLT 430* 397 458   Basic Metabolic Panel:  Recent Labs Lab 11/30/16 1010 12/03/16 0950 12/04/16 0133  NA 141 139 140  K 3.9 4.0 3.9  CL 103 105 105  CO2 28 26 28   GLUCOSE 110* 115* 104*  BUN 10 15 10   CREATININE 0.84 0.82 0.75  CALCIUM 9.4 9.5 9.3   GFR: Estimated Creatinine Clearance: 73.3 mL/min (by C-G formula based on SCr of 0.75 mg/dL). Liver Function Tests:  Recent Labs Lab 11/30/16 1010 12/04/16 0133  AST 20 28  ALT 20 25  ALKPHOS  --  65  BILITOT 0.3 0.8  PROT 7.0 6.5  ALBUMIN  --  3.4*   No results for input(s): LIPASE, AMYLASE in the last 168 hours. No results for input(s): AMMONIA in the last 168 hours. Coagulation Profile:  Recent Labs Lab 12/03/16 2252  INR 0.99   Cardiac Enzymes: No results for input(s): CKTOTAL, CKMB, CKMBINDEX, TROPONINI in the last 168 hours. BNP (last 3 results) No results for input(s): PROBNP in the last 8760 hours. HbA1C:  Recent Labs  12/03/16 2252  HGBA1C 6.3*   CBG:  Recent Labs Lab 12/03/16 1707 12/03/16 2142 12/04/16 0736  GLUCAP 93 93 112*   Lipid Profile: No results for input(s): CHOL, HDL, LDLCALC, TRIG, CHOLHDL, LDLDIRECT in the last 72 hours. Thyroid Function Tests:  Recent Labs  12/03/16 2252  TSH 2.253   Anemia Panel:  Recent Labs  12/03/16 2252  VITAMINB12 337  FOLATE 23.3  FERRITIN 8*  TIBC 550*  IRON 115  RETICCTPCT 1.4   Sepsis Labs: No results for input(s): PROCALCITON, LATICACIDVEN in the last 168 hours.  No results found for this or any previous visit (from the past 240 hour(s)).       Radiology Studies: X-ray Chest Pa And Lateral  Result Date: 12/04/2016 CLINICAL DATA:  66 year old female with anemia and cough EXAM: CHEST  2 VIEW COMPARISON:  Prior chest x-ray 06/08/2012 FINDINGS: The lungs are clear and  negative for focal airspace consolidation, pulmonary edema or suspicious pulmonary nodule. No pleural effusion or pneumothorax. Cardiac and mediastinal contours are within normal limits. No acute fracture or lytic or blastic osseous lesions. The visualized upper abdominal bowel gas pattern is unremarkable. Incompletely imaged cervical spine stabilization hardware. IMPRESSION: Negative chest x-ray. Electronically Signed   By: Jacqulynn Cadet M.D.   On: 12/04/2016 08:53   Ct Abdomen Pelvis W Contrast  Result Date: 12/03/2016 CLINICAL DATA:  Low hemoglobin. Dark stools. Distended abdomen with generalized tiredness. EXAM: CT ABDOMEN AND PELVIS WITH CONTRAST TECHNIQUE: Multidetector CT imaging of the abdomen and pelvis  was performed using the standard protocol following bolus administration of intravenous contrast. CONTRAST:  1100mL ISOVUE-300 IOPAMIDOL (ISOVUE-300) INJECTION 61% COMPARISON:  CT, 05/12/2015 FINDINGS: Lower chest: Lung bases essentially clear.  Heart normal in size. Hepatobiliary: No focal liver abnormality is seen. Status post cholecystectomy. No biliary dilatation. Pancreas: Unremarkable. No pancreatic ductal dilatation or surrounding inflammatory changes. Spleen: Normal in size without focal abnormality. Adrenals/Urinary Tract: Adrenal glands are unremarkable. Kidneys are normal, without renal calculi, focal lesion, or hydronephrosis. Bladder is unremarkable. Stomach/Bowel: Stomach and small bowel unremarkable. There scattered colonic diverticula. No diverticulitis. Colon otherwise unremarkable. Vascular/Lymphatic: Aortic atherosclerosis. No enlarged abdominal or pelvic lymph nodes. Reproductive: Several uterine calcifications likely in small fibroids. Uterus otherwise unremarkable. Calcification along the posterior margin of a normal size right ovary. No adnexal masses. Other: No abdominal wall hernia or abnormality. No abdominopelvic ascites. Musculoskeletal: No fracture or acute finding. No  osteoblastic or osteolytic lesions. IMPRESSION: 1. No acute findings. 2. Colonic diverticulum.  No other bowel abnormality. 3. Status post cholecystectomy. 4. Aortic atherosclerosis. Electronically Signed   By: Lajean Manes M.D.   On: 12/03/2016 12:55        Scheduled Meds: . allopurinol  450 mg Oral Daily  . amLODipine  10 mg Oral Daily  . calcium carbonate  1 tablet Oral Q breakfast  . DULoxetine  60 mg Oral BID  . fluticasone  1 spray Each Nare Daily  . gabapentin  600 mg Oral QHS  . hydrochlorothiazide  25 mg Oral Daily  . insulin aspart  0-9 Units Subcutaneous TID WC  . losartan  100 mg Oral Daily  . metoprolol succinate  100 mg Oral Daily  . pantoprazole (PROTONIX) IV  40 mg Intravenous Q12H  . QUEtiapine  300 mg Oral QHS  . raloxifene  60 mg Oral Daily   Continuous Infusions: . sodium chloride Stopped (12/04/16 0234)  . sodium chloride 100 mL/hr at 12/04/16 0713     LOS: 0 days     Georgette Shell, MD [facility]  If 7PM-7AM, please contact night-coverage [email or web address] Password [email or web address] 12/04/2016, 11:16 AM

## 2016-12-04 NOTE — Interval H&P Note (Signed)
History and Physical Interval Note:  12/04/2016 11:30 AM  Tammy Mullins  has presented today for surgery, with the diagnosis of recent black stools, iron deficiency anemia  The various methods of treatment have been discussed with the patient and family. After consideration of risks, benefits and other options for treatment, the patient has consented to  Procedure(s): ESOPHAGOGASTRODUODENOSCOPY (EGD) (N/A) as a surgical intervention .  The patient's history has been reviewed, patient examined, no change in status, stable for surgery.  I have reviewed the patient's chart and labs.  Questions were answered to the patient's satisfaction.     Pricilla Riffle. Fuller Plan

## 2016-12-04 NOTE — Consult Note (Signed)
Referring Provider: Internal Medicine Teaching Service Primary Care Physician:  Maury Dus, MD Primary Gastroenterologist: Silvano Rusk,  MD  Reason for Consultation: GI bleed / anemia    Attending physician's note   I have taken a history, examined the patient and reviewed the chart. I agree with the Advanced Practitioner's note, impression and recommendations. Severe iron deficiency anemia with a history of dark stools, not definitely melena, stool is back to brown and is heme negative on admission. R/O ulcer, AVM, IC valve ulcer. IBS-D with postprandial loose stools with urgency. Add Dicyclomine 10 mg tid ac. Maintain Hb > 7. IV iron replacement in hospital. EGD today.   [name], MD [name] [phone] Mon-Fri 8a-5p [phone] after 5p, weekends, holidays   ASSESSMENT AND PLAN:   31. 66 yo female with severe iron deficiency anemia. Hgb in March was 10.6, down to 7.3 yesterday. She had very dark stools last week but stools back to normal now. Rule out PUD, AVMs. 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  -patient needs EGD. The risks and benefits of EGD were discussed and the patient agrees to proceed. Keep NPO -continue BID PPI -monitor hgb, transfuse as needed. She has received 2 units of blood thus far. Hgb 8.7 this am -If EGD is negative we may need to repeat colonoscopy as she is due for repeat soon anyway.   2. 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 For repeat in December 2018  3. HTN, Gout  4. ? Barrett's esophagus, not present on EGD with bx 2008  5. Irritable bowel syndrome.         HPI: Tammy Mullins is a 66 y.o. female who presented to ED after her Rheumatologist found her severely anemia by labs. Patient has been so tired lately and craving ice. No chest pains or SOB. Last week she noticed black stools on several occassions. She takes Pepto sometimes but none prior to the black stools. She takes Nexium at home. No NSAIDs. No  abdominal pain but some bloating. No nausea / vomiting. She has chronic intermittent loose stools with hx of IBS.  Ba  Past Medical History:  Diagnosis Date  . Allergic rhinitis, cause unspecified   . Anxiety state, unspecified   . Arthritis   . Attention deficit disorder without mention of hyperactivity   . Barrett's esophagus   . Chronic pain syndrome   . 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 Depression   . Diabetes type 2, controlled ([place]) 01/09/2014  . Diverticulosis of colon (without mention of hemorrhage)   . Esophageal reflux   . Fibromyalgia   . Gout, unspecified   . Hiatal hernia   . Irritable bowel syndrome   . 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 Myalgia and myositis, unspecified   . Osteoarthrosis, unspecified whether generalized or localized, unspecified site   . Personal history of unspecified circulatory disease   . Pure hypercholesterolemia   . Radial styloid tenosynovitis   . Unspecified essential hypertension   . Unspecified nonpsychotic mental disorder   . Unspecified pruritic disorder     Past Surgical History:  Procedure Laterality Date  . ANKLE SURGERY Right   . APPENDECTOMY    . CARPAL TUNNEL RELEASE Right   . CERVICAL SPINE SURGERY     plates and pins  . CHOLECYSTECTOMY N/A 06/26/2015   Procedure: LAPAROSCOPIC CHOLECYSTECTOMY;  Surgeon: Ralene Ok, MD;  Location: [facility];  Service: General;  Laterality: N/A;  . COLONOSCOPY    . ESOPHAGOGASTRODUODENOSCOPY    . LAPAROSCOPIC APPENDECTOMY N/A 06/26/2015   Procedure: LAPAROSCOPIC APPENDECTOMY;  Surgeon: Ralene Ok, MD;  Location: [facility];  Service: General;  Laterality: N/A;  . ROTATOR CUFF REPAIR Right   . SALPINGOOPHORECTOMY  Left   ectopic 1980    Prior to Admission medications   Medication Sig Start Date End Date Taking? Authorizing Provider  allopurinol (ZYLOPRIM) 300 MG tablet Take 1.5 tablets (450 mg total) by mouth daily. 11/30/16  Yes Panwala, Naitik, PA-C  amLODipine  (NORVASC) 10 MG tablet TAKE 1 TABLET DAILY 10/21/13  Yes Debbrah Alar, NP  aspirin 81 MG tablet Take 81 mg by mouth daily.     Yes [provider]  baclofen (LIORESAL) 10 MG tablet Take 1 tablet (10 mg total) by mouth 3 (three) times daily. Patient taking differently: Take 10 mg by mouth 3 (three) times daily as needed for muscle spasms.  11/30/16 05/29/17 Yes Panwala, Naitik, PA-C  calcium carbonate (OSCAL) 1500 (600 Ca) MG TABS tablet Take 600 mg of elemental calcium by mouth daily with breakfast.   Yes [provider]  dicyclomine (BENTYL) 20 MG tablet Take 1 tablet (20 mg total) by mouth 3 (three) times daily as needed. 11/28/12  Yes Noralee Space, MD  DULoxetine (CYMBALTA) 60 MG capsule Take 60 mg by mouth 2 (two) times daily.  04/18/16  Yes [provider]  esomeprazole (NEXIUM) 20 MG capsule Take 20 mg by mouth daily at 12 noon.   Yes [provider]  gabapentin (NEURONTIN) 600 MG tablet Take 1 tablet (600 mg total) by mouth at bedtime. 11/30/16 02/28/17 Yes Panwala, Naitik, PA-C  guaiFENesin (MUCINEX) 600 MG 12 hr tablet Take 600 mg by mouth daily as needed for cough or to loosen phlegm.   Yes [provider]  hydrochlorothiazide (HYDRODIURIL) 25 MG tablet Take 1 tablet (25 mg total) by mouth daily. 12/18/13  Yes Debbrah Alar, NP  losartan (COZAAR) 100 MG tablet Take 1 tablet (100 mg total) by mouth daily. 05/08/12  Yes Noralee Space, MD  metoprolol succinate (TOPROL-XL) 100 MG 24 hr tablet TAKE 1 TABLET DAILY 10/21/13  Yes Debbrah Alar, NP  mometasone (NASONEX) 50 MCG/ACT nasal spray Place 2 sprays into the nose daily as needed (FOR ALLERGIES).   Yes [provider]  QUEtiapine (SEROQUEL) 300 MG tablet Take 300 mg by mouth at bedtime.    Yes [provider]  raloxifene (EVISTA) 60 MG tablet Take 60 mg by mouth daily.  05/11/15  Yes [provider]  Calcium Carbonate-Vit D-Min (CALCIUM 1200) 1200-1000 MG-UNIT  CHEW Chew 1 tablet by mouth daily.    [provider]  Cholecalciferol (VITAMIN D) 2000 UNITS CAPS Take 2,000 Units by mouth daily.     [provider]  diclofenac sodium (VOLTAREN) 1 % GEL Voltaren Gel 3 grams to 3 large joints upto TID 3 TUBES with 3 refills 11/30/16   Panwala, Naitik, PA-C  fluticasone (FLONASE) 50 MCG/ACT nasal spray Place 2 sprays into both nostrils at bedtime. 10/21/13   Debbrah Alar, NP  HYDROcodone-acetaminophen (NORCO/VICODIN) 5-325 MG tablet Take 1 tablet by mouth at bedtime as needed for moderate pain. Patient not taking: Reported on 11/30/2016 09/07/16   Bo Merino, MD  lidocaine (XYLOCAINE) 5 % ointment Apply 1 application topically 3 (three) times daily as needed.    [provider]  naproxen (NAPROSYN) 500 MG tablet Take 500 mg by mouth 2 (two) times  daily. 05/13/15   [provider]  omeprazole (PRILOSEC) 20 MG capsule TAKE 1 CAPSULE EVERY DAY Patient not taking: Reported on 11/30/2016 10/21/13   Debbrah Alar, NP    Current Facility-Administered Medications  Medication Dose Route Frequency Provider Last Rate Last Dose  . 0.9 %  sodium chloride infusion  10 mL/hr Intravenous Once Fredia Sorrow, MD   Stopped at 12/04/16 0234  . 0.9 %  sodium chloride infusion   Intravenous Continuous Rondel Jumbo, PA-C 100 mL/hr at 12/04/16 8469    . acetaminophen (TYLENOL) tablet 650 mg  650 mg Oral Q6H PRN Rondel Jumbo, PA-C       Or  . acetaminophen (TYLENOL) suppository 650 mg  650 mg Rectal Q6H PRN Rondel Jumbo, PA-C      . allopurinol (ZYLOPRIM) tablet 450 mg  450 mg Oral Daily Rondel Jumbo, PA-C   450 mg at 12/03/16 1919  . amLODipine (NORVASC) tablet 10 mg  10 mg Oral Daily Rondel Jumbo, PA-C   10 mg at 12/04/16 0859  . baclofen (LIORESAL) tablet 10 mg  10 mg Oral TID PRN Rondel Jumbo, PA-C      . bisacodyl (DULCOLAX) suppository 10 mg  10 mg Rectal Daily PRN Rondel Jumbo, PA-C      . calcium  carbonate (OS-CAL - dosed in mg of elemental calcium) tablet 500 mg of elemental calcium  1 tablet Oral Q breakfast Sinclair, Emily S, RPH      . dicyclomine (BENTYL) tablet 20 mg  20 mg Oral TID PRN Rondel Jumbo, PA-C      . DULoxetine (CYMBALTA) DR capsule 60 mg  60 mg Oral BID Rondel Jumbo, PA-C   60 mg at 12/03/16 2101  . fluticasone (FLONASE) 50 MCG/ACT nasal spray 1 spray  1 spray Each Nare Daily Rondel Jumbo, PA-C      . gabapentin (NEURONTIN) tablet 600 mg  600 mg Oral QHS Rondel Jumbo, PA-C   600 mg at 12/03/16 2058  . guaiFENesin (MUCINEX) 12 hr tablet 600 mg  600 mg Oral Daily PRN Rondel Jumbo, PA-C      . hydrochlorothiazide (HYDRODIURIL) tablet 25 mg  25 mg Oral Daily Rondel Jumbo, PA-C   25 mg at 12/03/16 1909  . Influenza vac split quadrivalent PF (FLUZONE HIGH-DOSE) injection 0.5 mL  0.5 mL Intramuscular Tomorrow-1000 Georgette Shell, MD      . insulin aspart (novoLOG) injection 0-9 Units  0-9 Units Subcutaneous TID WC Wertman, Coralee Pesa, PA-C      . losartan (COZAAR) tablet 100 mg  100 mg Oral Daily Rondel Jumbo, PA-C   100 mg at 12/03/16 1908  . metoprolol succinate (TOPROL-XL) 24 hr tablet 100 mg  100 mg Oral Daily Sharene Butters E, PA-C   100 mg at 12/04/16 0900  . ondansetron (ZOFRAN) tablet 4 mg  4 mg Oral Q6H PRN Rondel Jumbo, PA-C       Or  . ondansetron (ZOFRAN) injection 4 mg  4 mg Intravenous Q6H PRN Rondel Jumbo, PA-C      . pantoprazole (PROTONIX) injection 40 mg  40 mg Intravenous Q12H Rondel Jumbo, PA-C   40 mg at 12/03/16 2123  . QUEtiapine (SEROQUEL) tablet 300 mg  300 mg Oral QHS Rondel Jumbo, PA-C   300 mg at 12/03/16 2058  . raloxifene (EVISTA) tablet 60 mg  60 mg Oral Daily Rondel Jumbo, PA-C  60 mg at 12/03/16 1920  . senna-docusate (Senokot-S) tablet 1 tablet  1 tablet Oral QHS PRN Rondel Jumbo, PA-C        Allergies as of 12/03/2016 - Review Complete 12/03/2016  Allergen Reaction Noted  . Crestor [rosuvastatin  calcium] Other (See Comments) 11/04/2010  . Asa [aspirin]  06/30/2016    Family History  Problem Relation Age of Onset  . Cirrhosis Father   . Cancer Maternal Grandmother        ?  . Lung disease Mother        ?   . Glaucoma Mother   . Glaucoma Brother   . Prostate cancer Brother   . Heart disease Sister   . Kidney disease Neg Hx   . Diabetes Neg Hx     Social History   Social History  . Marital status: Widowed    Spouse name: N/A  . Number of children: 0  . Years of education: N/A   Occupational History  . retired    Social History Main Topics  . Smoking status: Never Smoker  . Smokeless tobacco: Never Used  . Alcohol use No  . Drug use: No  . Sexual activity: No   Other Topics Concern  . 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  [place]          Review of Systems: All systems reviewed and negative except where noted in HPI.  Physical Exam: Vital signs in last 24 hours: Temp:  [98.1 F (36.7 C)-98.7 F (37.1 C)] 98.5 F (36.9 C) (10/07 0531) Pulse Rate:  [68-101] 70 (10/07 0531) Resp:  [11-20] 18 (10/07 0531) BP: (139-186)/(64-98) 147/71 (10/07 0531) SpO2:  [95 %-100 %] 95 % (10/07 0531) Weight:  [188 lb 1.6 oz (85.3 kg)-189 lb 14.4 oz (86.1 kg)] 188 lb 1.6 oz (85.3 kg) (10/07 0531) Last BM Date: 12/03/16 General:   Alert, well-developed,  Black female in NAD Psych:  Pleasant, cooperative. Normal mood and affect. Eyes:  Pupils equal, sclera clear, no icterus.   Conjunctiva pale Ears:  Normal auditory acuity. Nose:  No deformity, discharge,  or lesions. Neck:  Supple; no masses Lungs:  Clear throughout to auscultation.   No wheezes, crackles, or rhonchi.  Heart:   Regular rate and rhythm; no murmurs, no edema Abdomen:  Soft, non-distended, nontender, BS active, no palp mass    Rectal:  Deferred  Msk:  Symmetrical without gross deformities. . Pulses:  Normal pulses noted. Neurologic:  Alert and  oriented x4;  grossly normal neurologically. Skin:  Intact without significant lesions or rashes..   Intake/Output from previous day: 10/06 0701 - 10/07 0700 In: 1919.7 [I.V.:596.7; Blood:1073; IV Piggyback:250] Out: 400 [Urine:400] Intake/Output this shift: No intake/output data recorded.  Lab Results:  Recent Labs  12/03/16 0950 12/04/16 0133  WBC 7.1 6.9  HGB 6.7* 8.7*  HCT 24.0* 28.9*  PLT 397 340   BMET  Recent Labs  12/03/16 0950 12/04/16 0133  NA 139 140  K 4.0 3.9  CL 105 105  CO2 26 28  GLUCOSE 115* 104*  BUN 15 10  CREATININE 0.82 0.75  CALCIUM 9.5 9.3   LFT  Recent Labs  12/04/16 0133  PROT 6.5  ALBUMIN 3.4*  AST 28  ALT 25  ALKPHOS 65  BILITOT 0.8   PT/INR  Recent Labs  12/03/16 2252  LABPROT 13.0  INR 0.99    Studies/Results: X-ray Chest Pa And Lateral  Result Date: 12/04/2016 CLINICAL DATA:  66 year old female with anemia and cough EXAM: CHEST  2 VIEW COMPARISON:  Prior chest x-ray 06/08/2012 FINDINGS: The lungs are clear and negative for focal airspace consolidation, pulmonary edema or suspicious pulmonary nodule. No pleural effusion or pneumothorax. Cardiac and mediastinal contours are within normal limits. No acute fracture or lytic or blastic osseous lesions. The visualized upper abdominal bowel gas pattern is unremarkable. Incompletely imaged cervical spine stabilization hardware. IMPRESSION: Negative chest x-ray. Electronically Signed   By: Jacqulynn Cadet M.D.   On: 12/04/2016 08:53   Ct Abdomen Pelvis W Contrast  Result Date: 12/03/2016 CLINICAL DATA:  Low hemoglobin. Dark stools. Distended abdomen with generalized tiredness. EXAM: CT ABDOMEN AND PELVIS WITH CONTRAST TECHNIQUE: Multidetector  CT imaging of the abdomen and pelvis was performed using the standard protocol following bolus administration of intravenous contrast. CONTRAST:  110mL ISOVUE-300 IOPAMIDOL (ISOVUE-300) INJECTION 61% COMPARISON:  CT, 05/12/2015 FINDINGS: Lower chest: Lung bases essentially clear.  Heart normal in size. Hepatobiliary: No focal liver abnormality is seen. Status post cholecystectomy. No biliary dilatation. Pancreas: Unremarkable. No pancreatic ductal dilatation or surrounding inflammatory changes. Spleen: Normal in size without focal abnormality. Adrenals/Urinary Tract: Adrenal glands are unremarkable. Kidneys are normal, without renal calculi, focal lesion, or hydronephrosis. Bladder is unremarkable. Stomach/Bowel: Stomach and small bowel unremarkable. There scattered colonic diverticula. No diverticulitis. Colon otherwise unremarkable. Vascular/Lymphatic: Aortic atherosclerosis. No enlarged abdominal or pelvic lymph nodes. Reproductive: Several uterine calcifications likely in small fibroids. Uterus otherwise unremarkable. Calcification along the posterior margin of a normal size right ovary. No adnexal masses. Other: No abdominal wall hernia or abnormality. No abdominopelvic ascites. Musculoskeletal: No fracture or acute finding. No osteoblastic or osteolytic lesions. IMPRESSION: 1. No acute findings. 2. Colonic diverticulum.  No other bowel abnormality. 3. Status post cholecystectomy. 4. Aortic atherosclerosis. Electronically Signed   By: Lajean Manes M.D.   On: 12/03/2016 12:55    Tye Savoy, NP-C @  12/04/2016, 9:01 AM  Pager number [phone]

## 2016-12-04 NOTE — Op Note (Addendum)
[facility] Patient Name: Tammy Mullins Procedure Date : 12/04/2016 MRN: [id number] Attending MD: Ladene Artist , MD Date of Birth: 1950-04-07 CSN: [id number] Age: 66 Admit Type: Inpatient Procedure:                Upper GI endoscopy Indications:              Iron deficiency anemia, dark stools Providers:                Pricilla Riffle. Fuller Plan, MD, Kingsley Plan, RN, Elspeth Cho Tech., Technician Referring MD:             [facility] Medicines:                Fentanyl 50 micrograms IV, Midazolam 5 mg IV Complications:            No immediate complications. Estimated Blood Loss:     Estimated blood loss was minimal. Procedure:                Pre-Anesthesia Assessment:                           - Prior to the procedure, a History and Physical                            was performed, and patient medications and                            allergies were reviewed. The patient's tolerance of                            previous anesthesia was also reviewed. The risks                            and benefits of the procedure and the sedation                            options and risks were discussed with the patient.                            All questions were answered, and informed consent                            was obtained. Prior Anticoagulants: The patient has                            taken no previous anticoagulant or antiplatelet                            agents. ASA Grade Assessment: II - A patient with                            mild systemic disease. After reviewing the risks  and benefits, the patient was deemed in                            satisfactory condition to undergo the procedure.                           After obtaining informed consent, the endoscope was                            passed under direct vision. Throughout the                            procedure, the patient's blood  pressure, pulse, and                            oxygen saturations were monitored continuously. The                            [id number] ([id number]) scope was introduced through the                            mouth, and advanced to the second part of duodenum.                            The upper GI endoscopy was accomplished without                            difficulty. The patient tolerated the procedure                            well. Scope In: Scope Out: Findings:      The examined esophagus was normal.      A small hiatal hernia was present.      The exam of the stomach was otherwise normal.      The duodenal bulb and second portion of the duodenum were normal.       Biopsies for histology were taken with a cold forceps for evaluation of       celiac disease. Impression:               - Normal esophagus.                           - Small hiatal hernia.                           - Normal duodenal bulb and second portion of the                            duodenum. Biopsied. Moderate Sedation:      Moderate (conscious) sedation was administered by the endoscopy nurse       and supervised by the endoscopist. The following parameters were       monitored: oxygen saturation, heart rate, blood pressure, respiratory       rate, EKG, adequacy of pulmonary ventilation, and response to care.       Total  physician intraservice time was 8 minutes. Recommendation:           - Return patient to hospital ward for ongoing care.                           - Resume previous diet.                           - Continue present medications.                           - Await pathology results.                           - Iron replacement, recommend IV Fe during                            hospitalization.                           - Dicyclomine 20 mg tid ac for IBS.                           - PPI po qam for GERD.                           - Keep colonoscopy appt with Dr. Silvano Rusk as                             outpatient in Dec 2018 for follow up of IC valve                            ulcer.                           - No further inpatient GI evaluation planned.                           - GI signing off.                           - OK for discharge from GI standpoint when Hb is                            stable > 7. Procedure Code(s):        --- Professional ---                           [phone], Esophagogastroduodenoscopy, flexible,                            transoral; with biopsy, single or multiple Diagnosis Code(s):        --- Professional ---                           K44.9, Diaphragmatic hernia without obstruction or  gangrene                           D50.9, Iron deficiency anemia, unspecified CPT copyright 2016 [employer]. All rights reserved. The codes documented in this report are preliminary and upon coder review may  be revised to meet current compliance requirements. Ladene Artist, MD 12/04/2016 11:45:33 AM This report has been signed electronically. Number of Addenda: 0

## 2016-12-05 DIAGNOSIS — R195 Other fecal abnormalities: Secondary | ICD-10-CM

## 2016-12-05 LAB — CBC WITH DIFFERENTIAL/PLATELET
Basophils Absolute: 0 10*3/uL (ref 0.0–0.1)
Basophils Relative: 0 %
Eosinophils Absolute: 0.3 10*3/uL (ref 0.0–0.7)
Eosinophils Relative: 3 %
HEMATOCRIT: 29.9 % — AB (ref 36.0–46.0)
Hemoglobin: 8.9 g/dL — ABNORMAL LOW (ref 12.0–15.0)
LYMPHS PCT: 26 %
Lymphs Abs: 2 10*3/uL (ref 0.7–4.0)
MCH: 23.4 pg — ABNORMAL LOW (ref 26.0–34.0)
MCHC: 29.8 g/dL — AB (ref 30.0–36.0)
MCV: 78.5 fL (ref 78.0–100.0)
MONO ABS: 0.5 10*3/uL (ref 0.1–1.0)
MONOS PCT: 6 %
NEUTROS ABS: 5 10*3/uL (ref 1.7–7.7)
Neutrophils Relative %: 65 %
Platelets: 342 10*3/uL (ref 150–400)
RBC: 3.81 MIL/uL — ABNORMAL LOW (ref 3.87–5.11)
RDW: 18 % — AB (ref 11.5–15.5)
WBC: 7.8 10*3/uL (ref 4.0–10.5)

## 2016-12-05 LAB — GLUCOSE, CAPILLARY
Glucose-Capillary: 99 mg/dL (ref 65–99)
Glucose-Capillary: 137 mg/dL — ABNORMAL HIGH (ref 65–99)

## 2016-12-05 MED ORDER — FERROUS SULFATE 325 (65 FE) MG PO TABS: 325.0000 mg | ORAL_TABLET | Freq: Two times a day (BID) | ORAL | 3 refills | Status: DC

## 2016-12-05 MED ORDER — FERROUS SULFATE 325 (65 FE) MG PO TABS
325.0000 mg | ORAL_TABLET | Freq: Two times a day (BID) | ORAL | Status: DC
  Administered 2016-12-05: 325 mg via ORAL
  Filled 2016-12-05: qty 1

## 2016-12-05 MED ORDER — PANTOPRAZOLE SODIUM 40 MG PO TBEC: 40.0000 mg | DELAYED_RELEASE_TABLET | Freq: Every day | ORAL | 0 refills | Status: DC

## 2016-12-05 MED ORDER — INSULIN ASPART 100 UNIT/ML ~~LOC~~ SOLN: 0.0000 [IU] | Freq: Three times a day (TID) | SUBCUTANEOUS | 11 refills | Status: DC

## 2016-12-05 MED ORDER — GLUCOSE BLOOD VI STRP: ORAL_STRIP | 12 refills | Status: DC

## 2016-12-05 NOTE — Progress Notes (Signed)
Nsg Discharge Note  Admit Date:  12/03/2016 Discharge date: 12/05/2016   Tammy Mullins to be D/C'd Home per MD order.  AVS completed.  Copy for chart, and copy for patient signed, and dated. Patient/caregiver able to verbalize understanding.  Discharge Medication: Allergies as of 12/05/2016      Reactions   Crestor [rosuvastatin Calcium] Other (See Comments)   Causes severe body aches   Asa [aspirin]    GI upset in hight doses can take a 81mg       Medication List    STOP taking these medications   diclofenac sodium 1 % Gel Commonly known as:  VOLTAREN   esomeprazole 20 MG capsule Commonly known as:  NEXIUM   guaiFENesin 600 MG 12 hr tablet Commonly known as:  MUCINEX   HYDROcodone-acetaminophen 5-325 MG tablet Commonly known as:  NORCO/VICODIN   lidocaine 5 % ointment Commonly known as:  XYLOCAINE   naproxen 500 MG tablet Commonly known as:  NAPROSYN   omeprazole 20 MG capsule Commonly known as:  PRILOSEC     TAKE these medications   allopurinol 300 MG tablet Commonly known as:  ZYLOPRIM Take 1.5 tablets (450 mg total) by mouth daily.   amLODipine 10 MG tablet Commonly known as:  NORVASC TAKE 1 TABLET DAILY   aspirin 81 MG tablet Take 81 mg by mouth daily.   baclofen 10 MG tablet Commonly known as:  LIORESAL Take 1 tablet (10 mg total) by mouth 3 (three) times daily. What changed:  when to take this  reasons to take this   CALCIUM 1200 1200-1000 MG-UNIT Chew Chew 1 tablet by mouth daily.   calcium carbonate 1500 (600 Ca) MG Tabs tablet Commonly known as:  OSCAL Take 600 mg of elemental calcium by mouth daily with breakfast.   dicyclomine 20 MG tablet Commonly known as:  BENTYL Take 1 tablet (20 mg total) by mouth 3 (three) times daily as needed.   DULoxetine 60 MG capsule Commonly known as:  CYMBALTA Take 60 mg by mouth 2 (two) times daily.   ferrous sulfate 325 (65 FE) MG tablet Take 1 tablet (325 mg total) by mouth 2 (two) times daily  with a meal.   fluticasone 50 MCG/ACT nasal spray Commonly known as:  FLONASE Place 2 sprays into both nostrils at bedtime.   gabapentin 600 MG tablet Commonly known as:  NEURONTIN Take 1 tablet (600 mg total) by mouth at bedtime.   glucose blood test strip Commonly known as:  TRUE METRIX BLOOD GLUCOSE TEST Use as instructed   hydrochlorothiazide 25 MG tablet Commonly known as:  HYDRODIURIL Take 1 tablet (25 mg total) by mouth daily.   losartan 100 MG tablet Commonly known as:  COZAAR Take 1 tablet (100 mg total) by mouth daily.   metoprolol succinate 100 MG 24 hr tablet Commonly known as:  TOPROL-XL TAKE 1 TABLET DAILY   mometasone 50 MCG/ACT nasal spray Commonly known as:  NASONEX Place 2 sprays into the nose daily as needed (FOR ALLERGIES).   pantoprazole 40 MG tablet Commonly known as:  PROTONIX Take 1 tablet (40 mg total) by mouth daily.   QUEtiapine 300 MG tablet Commonly known as:  SEROQUEL Take 300 mg by mouth at bedtime.   raloxifene 60 MG tablet Commonly known as:  EVISTA Take 60 mg by mouth daily.   Vitamin D 2000 units Caps Take 2,000 Units by mouth daily.       Discharge Assessment: Vitals:   12/05/16 0500 12/05/16 1024  BP: 115/60 (!) 153/79  Pulse: 80 86  Resp: 18   Temp: 98 F (36.7 C)   SpO2: 95%    Skin clean, dry and intact without evidence of skin break down, no evidence of skin tears noted. IV catheter discontinued intact. Site without signs and symptoms of complications - no redness or edema noted at insertion site, patient denies c/o pain - only slight tenderness at site.  Dressing with slight pressure applied.  D/c Instructions-Education: Discharge instructions given to patient/family with verbalized understanding. D/c education completed with patient/family including follow up instructions, medication list, d/c activities limitations if indicated, with other d/c instructions as indicated by MD - patient able to verbalize  understanding, all questions fully answered. Patient instructed to return to ED, call 911, or call MD for any changes in condition.  Patient escorted via [place], and D/C home via private auto.  Tammy Mullins Margaretha Sheffield, RN 12/05/2016 1:44 PM

## 2016-12-05 NOTE — Progress Notes (Signed)
C/w

## 2016-12-05 NOTE — Discharge Instructions (Signed)
Follow up with dr Silvano [facility] for colonoscopy 02/14/2017

## 2016-12-05 NOTE — Discharge Summary (Signed)
Physician Discharge Summary  Tammy Mullins RKY:[id number] DOB: Feb 13, 1951 DOA: 12/03/2016  PCP: Maury Dus, MD  Admit date: 12/03/2016 Discharge date: 12/05/2016  Admitted From Disposition:    Recommendations for Outpatient Follow-up:  1. Follow up with PCP in 1-2 weeks 2. Please obtain BMP/CBC in one week 3. Please follow up on the biopsy   Home Health:none Equipment/Devices:none  Discharge Condition:stable CODE STATUS:full Diet recommendation:cardiac  Brief/Interim Summary:66 y.o.femalewith medical history significant of IBS,history of peptic ulcer in May 2018 patient had a colonoscopy showing some erosion in the colon apparently,came in with complaints of abdominal discomfort and dark black stools. Her hemoglobin was 11.8 in April 2017 came down to 10.6 in March 2018,7.3 in October 2018 down to 6.7 today.She denies any nausea or vomiting or weight loss. She takes naproxen 500 mg twice a day at home.She denies any fevers chills chest pain shortness of breath no nausea vomiting headache changes within vision or urinary complaints at this time.Denies any hematuria hematemesis shortness of breath cough at this time.  ED Course:She was found to have a hemoglobin of 6.7 she was started on blood transfusion. 2 sets of blood transfusion is ordered she is on the first unit at this time. Fecal occult blood test was negative. Appreciate GI eval.patient had a bm today brown color.no new co.she feels better.received 2 units of blood transfusion.hb 8.7 today. Patient had EGD 12/04/2016 dr stark normal esophagus,small hiatal hernia,normal duodenal bulb and second portion of duodenum.biopsy done.  Discharge Diagnoses:  Active Problems:   HYPERCHOLESTEROLEMIA   Anxiety state   Depression   Attention deficit disorder   Chronic pain syndrome   Essential hypertension   Allergic rhinitis   GERD   Diverticulosis of colon   Irritable bowel syndrome   Primary osteoarthritis of both  hands   Fibromyalgia   History of cardiovascular disorder   Vitamin D deficiency   History of gout   Dyslipidemia   Pain management   History of gastroesophageal reflux (GERD)   Colon ulcer - IC valve   Anemia   Dark stools iron deficiency anemia/IBS-D- received iv iron in hospital along with 2 units blood transfusion.hemoglobin remained stable.egd no ulcers noted.iron tabs bid.follow up with dr Carlean Purl on dc for colonoscopy on ded 18th.   Discharge Instructions follow up with gi   Allergies as of 12/05/2016      Reactions   Crestor [rosuvastatin Calcium] Other (See Comments)   Causes severe body aches   Asa [aspirin]    GI upset in hight doses can take a 81mg       Medication List    STOP taking these medications   diclofenac sodium 1 % Gel Commonly known as:  VOLTAREN   esomeprazole 20 MG capsule Commonly known as:  NEXIUM   guaiFENesin 600 MG 12 hr tablet Commonly known as:  MUCINEX   HYDROcodone-acetaminophen 5-325 MG tablet Commonly known as:  NORCO/VICODIN   lidocaine 5 % ointment Commonly known as:  XYLOCAINE   naproxen 500 MG tablet Commonly known as:  NAPROSYN   omeprazole 20 MG capsule Commonly known as:  PRILOSEC     TAKE these medications   allopurinol 300 MG tablet Commonly known as:  ZYLOPRIM Take 1.5 tablets (450 mg total) by mouth daily.   amLODipine 10 MG tablet Commonly known as:  NORVASC TAKE 1 TABLET DAILY   aspirin 81 MG tablet Take 81 mg by mouth daily.   baclofen 10 MG tablet Commonly known as:  LIORESAL Take 1 tablet (10  mg total) by mouth 3 (three) times daily. What changed:  when to take this  reasons to take this   CALCIUM 1200 1200-1000 MG-UNIT Chew Chew 1 tablet by mouth daily.   calcium carbonate 1500 (600 Ca) MG Tabs tablet Commonly known as:  OSCAL Take 600 mg of elemental calcium by mouth daily with breakfast.   dicyclomine 20 MG tablet Commonly known as:  BENTYL Take 1 tablet (20 mg total) by mouth 3  (three) times daily as needed.   DULoxetine 60 MG capsule Commonly known as:  CYMBALTA Take 60 mg by mouth 2 (two) times daily.   fluticasone 50 MCG/ACT nasal spray Commonly known as:  FLONASE Place 2 sprays into both nostrils at bedtime.   gabapentin 600 MG tablet Commonly known as:  NEURONTIN Take 1 tablet (600 mg total) by mouth at bedtime.   hydrochlorothiazide 25 MG tablet Commonly known as:  HYDRODIURIL Take 1 tablet (25 mg total) by mouth daily.   insulin aspart 100 UNIT/ML injection Commonly known as:  novoLOG Inject 0-9 Units into the skin 3 (three) times daily with meals.   losartan 100 MG tablet Commonly known as:  COZAAR Take 1 tablet (100 mg total) by mouth daily.   metoprolol succinate 100 MG 24 hr tablet Commonly known as:  TOPROL-XL TAKE 1 TABLET DAILY   mometasone 50 MCG/ACT nasal spray Commonly known as:  NASONEX Place 2 sprays into the nose daily as needed (FOR ALLERGIES).   pantoprazole 40 MG tablet Commonly known as:  PROTONIX Take 1 tablet (40 mg total) by mouth daily.   QUEtiapine 300 MG tablet Commonly known as:  SEROQUEL Take 300 mg by mouth at bedtime.   raloxifene 60 MG tablet Commonly known as:  EVISTA Take 60 mg by mouth daily.   Vitamin D 2000 units Caps Take 2,000 Units by mouth daily.      Follow-up Information    Gatha Mayer, MD Follow up.   Specialty:  Gastroenterology Contact information: [address] [address] [phone]          Allergies  Allergen Reactions  . Crestor [Rosuvastatin Calcium] Other (See Comments)    Causes severe body aches  . Asa [Aspirin]     GI upset in hight doses can take a 81mg     Consultations:  DR Justin Mend   Procedures/Studies: X-ray Chest Pa And Lateral  Result Date: 12/04/2016 CLINICAL DATA:  66 year old female with anemia and cough EXAM: CHEST  2 VIEW COMPARISON:  Prior chest x-ray 06/08/2012 FINDINGS: The lungs are clear and negative for focal airspace  consolidation, pulmonary edema or suspicious pulmonary nodule. No pleural effusion or pneumothorax. Cardiac and mediastinal contours are within normal limits. No acute fracture or lytic or blastic osseous lesions. The visualized upper abdominal bowel gas pattern is unremarkable. Incompletely imaged cervical spine stabilization hardware. IMPRESSION: Negative chest x-ray. Electronically Signed   By: Jacqulynn Cadet M.D.   On: 12/04/2016 08:53   Ct Abdomen Pelvis W Contrast  Result Date: 12/03/2016 CLINICAL DATA:  Low hemoglobin. Dark stools. Distended abdomen with generalized tiredness. EXAM: CT ABDOMEN AND PELVIS WITH CONTRAST TECHNIQUE: Multidetector CT imaging of the abdomen and pelvis was performed using the standard protocol following bolus administration of intravenous contrast. CONTRAST:  157mL ISOVUE-300 IOPAMIDOL (ISOVUE-300) INJECTION 61% COMPARISON:  CT, 05/12/2015 FINDINGS: Lower chest: Lung bases essentially clear.  Heart normal in size. Hepatobiliary: No focal liver abnormality is seen. Status post cholecystectomy. No biliary dilatation. Pancreas: Unremarkable. No pancreatic  ductal dilatation or surrounding inflammatory changes. Spleen: Normal in size without focal abnormality. Adrenals/Urinary Tract: Adrenal glands are unremarkable. Kidneys are normal, without renal calculi, focal lesion, or hydronephrosis. Bladder is unremarkable. Stomach/Bowel: Stomach and small bowel unremarkable. There scattered colonic diverticula. No diverticulitis. Colon otherwise unremarkable. Vascular/Lymphatic: Aortic atherosclerosis. No enlarged abdominal or pelvic lymph nodes. Reproductive: Several uterine calcifications likely in small fibroids. Uterus otherwise unremarkable. Calcification along the posterior margin of a normal size right ovary. No adnexal masses. Other: No abdominal wall hernia or abnormality. No abdominopelvic ascites. Musculoskeletal: No fracture or acute finding. No osteoblastic or osteolytic  lesions. IMPRESSION: 1. No acute findings. 2. Colonic diverticulum.  No other bowel abnormality. 3. Status post cholecystectomy. 4. Aortic atherosclerosis. Electronically Signed   By: Lajean Manes M.D.   On: 12/03/2016 12:55    (Echo, Carotid, EGD, Colonoscopy, ERCP)    Subjective:   Discharge Exam: Vitals:   12/05/16 0500 12/05/16 1024  BP: 115/60 (!) 153/79  Pulse: 80 86  Resp: 18   Temp: 98 F (36.7 C)   SpO2: 95%    Vitals:   12/04/16 1155 12/04/16 2100 12/05/16 0500 12/05/16 1024  BP: (!) 163/75 (!) 158/76 115/60 (!) 153/79  Pulse: 75 72 80 86  Resp: 12 18 18    Temp:  98.1 F (36.7 C) 98 F (36.7 C)   TempSrc:  Oral Oral   SpO2: 98% 97% 95%   Weight:   84.6 kg (186 lb 8 oz)   Height:        General: Pt is alert, awake, not in acute distress Cardiovascular: RRR, S1/S2 +, no rubs, no gallops Respiratory: CTA bilaterally, no wheezing, no rhonchi Abdominal: Soft, NT, ND, bowel sounds + Extremities: no edema, no cyanosis    The results of significant diagnostics from this hospitalization (including imaging, microbiology, ancillary and laboratory) are listed below for reference.     Microbiology: No results found for this or any previous visit (from the past 240 hour(s)).   Labs: BNP (last 3 results) No results for input(s): BNP in the last 8760 hours. Basic Metabolic Panel:  Recent Labs Lab 11/30/16 1010 12/03/16 0950 12/04/16 0133  NA 141 139 140  K 3.9 4.0 3.9  CL 103 105 105  CO2 28 26 28   GLUCOSE 110* 115* 104*  BUN 10 15 10   CREATININE 0.84 0.82 0.75  CALCIUM 9.4 9.5 9.3   Liver Function Tests:  Recent Labs Lab 11/30/16 1010 12/04/16 0133  AST 20 28  ALT 20 25  ALKPHOS  --  65  BILITOT 0.3 0.8  PROT 7.0 6.5  ALBUMIN  --  3.4*   No results for input(s): LIPASE, AMYLASE in the last 168 hours. No results for input(s): AMMONIA in the last 168 hours. CBC:  Recent Labs Lab 11/30/16 1010 12/03/16 0950 12/04/16 0133 12/05/16 0508   WBC 15.3* 7.1 6.9 7.8  NEUTROABS 12,179*  --   --  5.0  HGB 7.3* 6.7* 8.7* 8.9*  HCT 25.5* 24.0* 28.9* 29.9*  MCV 76.6* 76.2* 77.9* 78.5  PLT 430* 397 340 342   Cardiac Enzymes: No results for input(s): CKTOTAL, CKMB, CKMBINDEX, TROPONINI in the last 168 hours. BNP: Invalid input(s): POCBNP CBG:  Recent Labs Lab 12/03/16 1707 12/03/16 2142 12/04/16 0736 12/04/16 2154 12/05/16 0733  GLUCAP 93 93 112* 116* 99   D-Dimer No results for input(s): DDIMER in the last 72 hours. Hgb A1c  Recent Labs  12/03/16 2252  HGBA1C 6.3*   Lipid Profile No  results for input(s): CHOL, HDL, LDLCALC, TRIG, CHOLHDL, LDLDIRECT in the last 72 hours. Thyroid function studies  Recent Labs  12/03/16 2252  TSH 2.253   Anemia work up  Recent Labs  12/03/16 2252  VITAMINB12 337  FOLATE 23.3  FERRITIN 8*  TIBC 550*  IRON 115  RETICCTPCT 1.4   Urinalysis    Component Value Date/Time   COLORURINE STRAW (A) 12/03/2016 0538   APPEARANCEUR CLEAR 12/03/2016 0538   LABSPEC 1.008 12/03/2016 0538   [place] 7.0 12/03/2016 0538   GLUCOSEU NEGATIVE 12/03/2016 0538   [place] NEGATIVE 12/03/2016 0538   BILIRUBINUR NEGATIVE 12/03/2016 0538   [place] 12/03/2016 0538   PROTEINUR NEGATIVE 12/03/2016 0538   NITRITE NEGATIVE 12/03/2016 0538   LEUKOCYTESUR NEGATIVE 12/03/2016 0538   Sepsis Labs Invalid input(s): PROCALCITONIN,  WBC,  LACTICIDVEN Microbiology No results found for this or any previous visit (from the past 240 hour(s)).   Time coordinating discharge: Over 30 minutes  SIGNED:   Georgette Shell, MD  [facility] 12/05/2016, 11:16 AM  If 7PM-7AM, please contact night-coverage [email or web address] Password [email or web address]

## 2016-12-06 NOTE — Consult Note (Signed)
           [facility] CM Primary Care Navigator  12/06/2016  Tammy Mullins [birth year] [id number]   Wentto seepatient at the bedsideto identify possible discharge needs but she was already discharged per staff report.  Patient was discharged home yesterday.  Primary care provider's officeis listed as doing transition of care (TOC).  Patient has a discharge instruction to follow-up with primary care provider within1-2 weeks.   For questions, please contact:  Dannielle Huh, BSN, RN- [facility] Primary Care Navigator  Telephone: [phone] [place]

## 2016-12-08 ENCOUNTER — Encounter: Payer: Self-pay | Admitting: Gastroenterology

## 2016-12-12 DIAGNOSIS — F331 Major depressive disorder, recurrent, moderate: Secondary | ICD-10-CM | POA: Diagnosis not present

## 2016-12-13 DIAGNOSIS — D509 Iron deficiency anemia, unspecified: Secondary | ICD-10-CM | POA: Diagnosis not present

## 2016-12-13 DIAGNOSIS — K219 Gastro-esophageal reflux disease without esophagitis: Secondary | ICD-10-CM | POA: Diagnosis not present

## 2016-12-13 DIAGNOSIS — D500 Iron deficiency anemia secondary to blood loss (chronic): Secondary | ICD-10-CM | POA: Diagnosis not present

## 2016-12-13 DIAGNOSIS — Z6830 Body mass index (BMI) 30.0-30.9, adult: Secondary | ICD-10-CM | POA: Diagnosis not present

## 2017-01-03 DIAGNOSIS — D509 Iron deficiency anemia, unspecified: Secondary | ICD-10-CM | POA: Diagnosis not present

## 2017-01-03 DIAGNOSIS — M8580 Other specified disorders of bone density and structure, unspecified site: Secondary | ICD-10-CM | POA: Diagnosis not present

## 2017-01-03 DIAGNOSIS — Z0000 Encounter for general adult medical examination without abnormal findings: Secondary | ICD-10-CM | POA: Diagnosis not present

## 2017-01-03 DIAGNOSIS — Z6830 Body mass index (BMI) 30.0-30.9, adult: Secondary | ICD-10-CM | POA: Diagnosis not present

## 2017-01-03 DIAGNOSIS — K589 Irritable bowel syndrome without diarrhea: Secondary | ICD-10-CM | POA: Diagnosis not present

## 2017-01-03 DIAGNOSIS — I10 Essential (primary) hypertension: Secondary | ICD-10-CM | POA: Diagnosis not present

## 2017-01-03 DIAGNOSIS — Z1159 Encounter for screening for other viral diseases: Secondary | ICD-10-CM | POA: Diagnosis not present

## 2017-01-03 DIAGNOSIS — E2839 Other primary ovarian failure: Secondary | ICD-10-CM | POA: Diagnosis not present

## 2017-01-03 DIAGNOSIS — Z1389 Encounter for screening for other disorder: Secondary | ICD-10-CM | POA: Diagnosis not present

## 2017-01-03 DIAGNOSIS — K219 Gastro-esophageal reflux disease without esophagitis: Secondary | ICD-10-CM | POA: Diagnosis not present

## 2017-02-28 HISTORY — PX: COLONOSCOPY: SHX174

## 2017-03-15 ENCOUNTER — Encounter: Payer: Self-pay | Admitting: Internal Medicine

## 2017-03-18 ENCOUNTER — Other Ambulatory Visit: Payer: Self-pay | Admitting: Rheumatology

## 2017-03-20 NOTE — Telephone Encounter (Signed)
Last Visit: 11/30/16 Next Visit: 05/04/17  Okay to refill per Dr. Estanislado Pandy

## 2017-03-23 DIAGNOSIS — F331 Major depressive disorder, recurrent, moderate: Secondary | ICD-10-CM | POA: Diagnosis not present

## 2017-03-24 ENCOUNTER — Encounter: Payer: Self-pay | Admitting: Internal Medicine

## 2017-04-12 ENCOUNTER — Other Ambulatory Visit: Payer: Self-pay | Admitting: Rheumatology

## 2017-04-12 NOTE — Telephone Encounter (Signed)
Last Visit: 11/30/16 Next Visit: 05/04/17  Okay to refill per Dr. Estanislado Pandy

## 2017-04-20 NOTE — Progress Notes (Deleted)
Office Visit Note  Patient: Tammy Mullins             Date of Birth: 1951-01-31           MRN: [id number]             PCP: Maury Dus, MD Referring: Maury Dus, MD Visit Date: 05/04/2017 Occupation: @GUAROCC @    Subjective:  No chief complaint on file.   History of Present Illness: Tammy Mullins is a 67 y.o. female ***   Activities of Daily Living:  Patient reports morning stiffness for *** {minute/hour:19697}.   Patient {ACTIONS;DENIES/REPORTS:21021675::"Denies"} nocturnal pain.  Difficulty dressing/grooming: {ACTIONS;DENIES/REPORTS:21021675::"Denies"} Difficulty climbing stairs: {ACTIONS;DENIES/REPORTS:21021675::"Denies"} Difficulty getting out of chair: {ACTIONS;DENIES/REPORTS:21021675::"Denies"} Difficulty using hands for taps, buttons, cutlery, and/or writing: {ACTIONS;DENIES/REPORTS:21021675::"Denies"}   No Rheumatology ROS completed.   PMFS History:  Patient Active Problem List   Diagnosis Date Noted  . Anemia 12/03/2016  . 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 History of gastroesophageal reflux (GERD) 04/19/2016  . History of hypertension 04/19/2016  . Other fatigue 03/21/2016  . Primary insomnia 03/21/2016  . History of gout 03/21/2016  . Osteoarthritis of lumbar spine 03/21/2016  . Dyslipidemia 03/21/2016  . History of IBS 03/21/2016  . History of depression 03/21/2016  . History of cholelithiasis 03/21/2016  . Pain management 03/21/2016  . Diabetes type 2, controlled ([place]) 01/09/2014  . Gout 09/20/2013  . OSA (obstructive sleep apnea) 09/16/2013  . Sleep disturbance 06/27/2013  . Chest pain, unspecified 06/06/2012  . Vaginal atrophy 03/15/2012  . Osteopenia 03/15/2012  . Post-menopausal 03/15/2012  . Vitamin D deficiency 11/04/2010  . Depression 10/20/2007  . Chronic pain syndrome 10/20/2007  . Allergic rhinitis 10/20/2007  . HYPERCHOLESTEROLEMIA 10/03/2007  . History of cardiovascular disorder 10/03/2007  . Anxiety state 04/20/2007  .  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/2007  . Essential hypertension 04/20/2007  . GERD 04/20/2007  . Irritable bowel syndrome 04/20/2007  . Primary osteoarthritis of both hands 04/20/2007  . DE QUERVAIN'S TENOSYNOVITIS 04/20/2007  . Fibromyalgia 04/20/2007  . Diverticulosis of colon 01/09/2001    Past Medical History:  Diagnosis Date  . Allergic rhinitis, cause unspecified   . Anxiety state, unspecified   . Arthritis   . Attention deficit disorder without mention of hyperactivity   . Barrett's esophagus   . Chronic pain syndrome   . 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 Depression   . Diabetes type 2, controlled ([place]) 01/09/2014  . Diverticulosis of colon (without mention of hemorrhage)   . Esophageal reflux   . Fibromyalgia   . Gout, unspecified   . Hiatal hernia   . Irritable bowel syndrome   . 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 Myalgia and myositis, unspecified   . Osteoarthrosis, unspecified whether generalized or localized, unspecified site   . Personal history of unspecified circulatory disease   . Pure hypercholesterolemia   . Radial styloid tenosynovitis   . Unspecified essential hypertension   . Unspecified nonpsychotic mental disorder   . Unspecified pruritic disorder     Family History  Problem Relation Age of Onset  . Cirrhosis Father   . Cancer Maternal Grandmother        ?  . Lung disease Mother        ?   . Glaucoma Mother   . Glaucoma Brother   . Prostate cancer Brother   . Heart disease Sister   . Kidney disease Neg Hx   . Diabetes Neg Hx  Past Surgical History:  Procedure Laterality Date  . ANKLE SURGERY Right   . APPENDECTOMY    . CARPAL TUNNEL RELEASE Right   . CERVICAL SPINE SURGERY     plates and pins  . CHOLECYSTECTOMY N/A 06/26/2015   Procedure: LAPAROSCOPIC CHOLECYSTECTOMY;  Surgeon: Ralene Ok, MD;  Location: [facility];  Service: General;  Laterality: N/A;  . COLONOSCOPY    . ESOPHAGOGASTRODUODENOSCOPY    .  ESOPHAGOGASTRODUODENOSCOPY N/A 12/04/2016   Procedure: ESOPHAGOGASTRODUODENOSCOPY (EGD);  Surgeon: Ladene Artist, MD;  [facility] ENDOSCOPY;  Service: Endoscopy;  Laterality: N/A;  . LAPAROSCOPIC APPENDECTOMY N/A 06/26/2015   Procedure: LAPAROSCOPIC APPENDECTOMY;  Surgeon: Ralene Ok, MD;  Location: [facility];  Service: General;  Laterality: N/A;  . ROTATOR CUFF REPAIR Right   . 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  [place]        Objective: Vital Signs: There were no vitals taken for this visit.   Physical Exam   Musculoskeletal Exam: ***  CDAI Exam: No CDAI exam completed.    Investigation: No additional findings.Uric acid: 11/30/2016 4.2 CBC Latest Ref Rng & Units 12/05/2016 12/04/2016 12/03/2016  WBC 4.0 - 10.5 K/uL 7.8 6.9 7.1  Hemoglobin 12.0 - 15.0 g/dL 8.9(L) 8.7(L) 6.7(LL)  Hematocrit 36.0 - 46.0 % 29.9(L) 28.9(L) 24.0(L)  Platelets 150 - 400 K/uL 342 340 397   CMP Latest Ref Rng & Units 12/04/2016 12/03/2016 11/30/2016  Glucose 65 - 99 mg/dL 104(H) 115(H) 110(H)  BUN 6 - 20 mg/dL 10 15 10   Creatinine 0.44 - 1.00 mg/dL 0.75 0.82 0.84  Sodium 135 - 145 mmol/L 140 139 141  Potassium 3.5 - 5.1 mmol/L 3.9 4.0 3.9  Chloride 101 - 111 mmol/L 105 105 103  CO2 22 - 32 mmol/L 28 26 28   Calcium 8.9 - 10.3 mg/dL 9.3 9.5 9.4  Total Protein 6.5 - 8.1 g/dL 6.5 - 7.0  Total Bilirubin 0.3 - 1.2 mg/dL 0.8 - 0.3  Alkaline Phos 38 - 126 U/L 65 - -  AST 15 - 41 U/L 28 - 20  ALT 14 - 54 U/L 25 - 20    Imaging: No results found.  Speciality Comments: No specialty comments available.    Procedures:  No procedures performed Allergies:  Crestor [rosuvastatin calcium] and Asa [aspirin]   Assessment / Plan:     Visit Diagnoses: No diagnosis found.    Orders: No orders of the defined types were placed in this encounter.  No orders of the defined types were placed in this encounter.   Face-to-face time spent with patient was *** minutes. 50% of time was spent in counseling and coordination of care.  Follow-Up Instructions: No Follow-up on file.   Earnestine Mealing, CMA  Note - This record has been created using Editor, commissioning.  Chart creation errors have been sought, but may not always  have been located. Such creation errors do not reflect on  the standard of medical care.

## 2017-04-25 DIAGNOSIS — F331 Major depressive disorder, recurrent, moderate: Secondary | ICD-10-CM | POA: Diagnosis not present

## 2017-05-04 ENCOUNTER — Ambulatory Visit: Payer: PPO | Admitting: Rheumatology

## 2017-05-05 NOTE — Progress Notes (Signed)
Office Visit Note  Patient: Tammy Mullins             Date of Birth: [birth year]           MRN: [id number]             PCP: Maury Dus, MD Referring: Maury Dus, MD Visit Date: 05/19/2017 Occupation: @GUAROCC @    Subjective:  Right knee pain and swelling.   History of Present Illness: Tammy Mullins is a 67 y.o. female history of gout, osteoarthritis and fibromyalgia.  She states she has been having pain and discomfort in her right knee joint for the last 2 weeks.  She states this appears swollen.  She has some discomfort in her right shoulder.  The hand pain is tolerable.  She has not had any gout flares.  Her fibromyalgia is fairly well controlled.  She states she has been experiencing some muscle spasms in her back.  Activities of Daily Living:  Patient reports morning stiffness for 5 minutes.   Patient Reports nocturnal pain.  Difficulty dressing/grooming: Denies Difficulty climbing stairs: Denies Difficulty getting out of chair: Denies Difficulty using hands for taps, buttons, cutlery, and/or writing: Denies   Review of Systems  Constitutional: Positive for fatigue. Negative for night sweats, weight gain and weight loss.  HENT: Positive for mouth dryness. Negative for mouth sores, trouble swallowing, trouble swallowing and nose dryness.   Eyes: Negative for pain, redness, visual disturbance and dryness.  Respiratory: Negative for cough, shortness of breath and difficulty breathing.   Cardiovascular: Negative for chest pain, palpitations, hypertension, irregular heartbeat and swelling in legs/feet.  Gastrointestinal: Negative for blood in stool, constipation and diarrhea.  Endocrine: Negative for increased urination.  Genitourinary: Negative for vaginal dryness.  Musculoskeletal: Positive for arthralgias, joint pain, joint swelling, myalgias, morning stiffness and myalgias. Negative for muscle weakness and muscle tenderness.  Skin: Negative for color change, rash,  hair loss, skin tightness, ulcers and sensitivity to sunlight.  Allergic/Immunologic: Negative for susceptible to infections.  Neurological: Negative for dizziness, memory loss, night sweats and weakness.  Hematological: Negative for swollen glands.  Psychiatric/Behavioral: Positive for depressed mood. Negative for sleep disturbance. The patient is not nervous/anxious.     PMFS History:  Patient Active Problem List   Diagnosis Date Noted  . Anemia 12/03/2016  . 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 History of gastroesophageal reflux (GERD) 04/19/2016  . History of hypertension 04/19/2016  . Other fatigue 03/21/2016  . Primary insomnia 03/21/2016  . History of gout 03/21/2016  . Osteoarthritis of lumbar spine 03/21/2016  . Dyslipidemia 03/21/2016  . History of IBS 03/21/2016  . History of depression 03/21/2016  . History of cholelithiasis 03/21/2016  . Pain management 03/21/2016  . Diabetes type 2, controlled ([place]) 01/09/2014  . Gout 09/20/2013  . OSA (obstructive sleep apnea) 09/16/2013  . Sleep disturbance 06/27/2013  . Chest pain, unspecified 06/06/2012  . Vaginal atrophy 03/15/2012  . Osteopenia 03/15/2012  . Post-menopausal 03/15/2012  . Vitamin D deficiency 11/04/2010  . Depression 10/20/2007  . Chronic pain syndrome 10/20/2007  . Allergic rhinitis 10/20/2007  . HYPERCHOLESTEROLEMIA 10/03/2007  . History of cardiovascular disorder 10/03/2007  . Anxiety state 04/20/2007  . 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/2007  . Essential hypertension 04/20/2007  . GERD 04/20/2007  . Irritable bowel syndrome 04/20/2007  . Primary osteoarthritis of both hands 04/20/2007  . DE QUERVAIN'S TENOSYNOVITIS 04/20/2007  . Fibromyalgia 04/20/2007  . Diverticulosis of colon 01/09/2001    Past Medical History:  Diagnosis Date  .  Allergic rhinitis, cause unspecified   . Allergy   . Anxiety state, unspecified   . Arthritis   . Attention deficit disorder without mention of hyperactivity   .  Barrett's esophagus   . Blood transfusion without reported diagnosis 2018-10  . Chronic pain syndrome   . 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 Depression   . Diabetes type 2, controlled ([place]) 01/09/2014  . Diverticulosis of colon (without mention of hemorrhage)   . Esophageal reflux   . Fibromyalgia   . Gout, unspecified   . Hiatal hernia   . Irritable bowel syndrome   . 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 Myalgia and myositis, unspecified   . Osteoarthrosis, unspecified whether generalized or localized, unspecified site   . Personal history of unspecified circulatory disease   . Pure hypercholesterolemia   . Radial styloid tenosynovitis   . Unspecified essential hypertension   . Unspecified nonpsychotic mental disorder   . Unspecified pruritic disorder     Family History  Problem Relation Age of Onset  . Cirrhosis Father   . Cancer Maternal Grandmother        ?  . Lung disease Mother        ?   . Glaucoma Mother   . Glaucoma Brother   . Prostate cancer Brother   . Heart disease Sister   . Kidney disease Neg Hx   . Diabetes Neg Hx   . Colon cancer Neg Hx   . Colon polyps Neg Hx   . Rectal cancer Neg Hx   . Stomach cancer Neg Hx    Past Surgical History:  Procedure Laterality Date  . ANKLE SURGERY Right   . APPENDECTOMY    . CARPAL TUNNEL RELEASE Right   . CERVICAL SPINE SURGERY     plates and pins  . CHOLECYSTECTOMY N/A 06/26/2015   Procedure: LAPAROSCOPIC CHOLECYSTECTOMY;  Surgeon: Ralene Ok, MD;  Location: [place];  Service: General;  Laterality: N/A;  . COLONOSCOPY    . ESOPHAGOGASTRODUODENOSCOPY    . ESOPHAGOGASTRODUODENOSCOPY N/A 12/04/2016   Procedure: ESOPHAGOGASTRODUODENOSCOPY (EGD);  Surgeon: Ladene Artist, MD;  Location: [facility] ENDOSCOPY;  Service: Endoscopy;  Laterality: N/A;  . LAPAROSCOPIC APPENDECTOMY N/A 06/26/2015   Procedure: LAPAROSCOPIC APPENDECTOMY;  Surgeon: Ralene Ok, MD;  Location: [place];  Service: General;   Laterality: N/A;  . ROTATOR CUFF REPAIR Right   . SALPINGOOPHORECTOMY  Left   ectopic 1980  . 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  [place]        Objective: Vital Signs: BP 135/90 (BP Location: Left Arm, Patient Position: Sitting, Cuff Size: Normal)   Pulse 97   Resp 17   Ht 5' 5.5" (1.664 m)   Wt 188 lb (85.3 kg)   BMI 30.81 kg/m    Physical Exam  Constitutional: She is oriented to person, place, and time. She appears well-developed and well-nourished.  HENT:  Head: Normocephalic and atraumatic.  Eyes: Conjunctivae and EOM are normal.  Neck: Normal range of motion.  Cardiovascular: Normal rate, regular rhythm, normal heart sounds and intact distal pulses.  Pulmonary/Chest: Effort normal  and breath sounds normal.  Abdominal: Soft. Bowel sounds are normal.  Lymphadenopathy:    She has no cervical adenopathy.  Neurological: She is alert and oriented to person, place, and time.  Skin: Skin is warm and dry. Capillary refill takes less than 2 seconds.  Psychiatric: She has a normal mood and affect. Her behavior is normal.  Nursing note and vitals reviewed.    Musculoskeletal Exam: Spine thoracic lumbar spine good range of motion.  She is some discomfort range of motion of her lumbar spine.  Shoulder joints, elbow joints, wrist joints MCPs with good range of motion.  She DIP and PIP thickening in bilateral hands.  Hip joints were in good range of motion.  She has warmth and swelling in the right knee joint without any effusion.  Left knee joint was in good range of motion.  CDAI Exam: No CDAI exam completed.    Investigation: No additional  findings. CBC Latest Ref Rng & Units 12/05/2016 12/04/2016 12/03/2016  WBC 4.0 - 10.5 K/uL 7.8 6.9 7.1  Hemoglobin 12.0 - 15.0 g/dL 8.9(L) 8.7(L) 6.7(LL)  Hematocrit 36.0 - 46.0 % 29.9(L) 28.9(L) 24.0(L)  Platelets 150 - 400 K/uL 342 340 397   CMP Latest Ref Rng & Units 12/04/2016 12/03/2016 11/30/2016  Glucose 65 - 99 mg/dL 104(H) 115(H) 110(H)  BUN 6 - 20 mg/dL 10 15 10   Creatinine 0.44 - 1.00 mg/dL 0.75 0.82 0.84  Sodium 135 - 145 mmol/L 140 139 141  Potassium 3.5 - 5.1 mmol/L 3.9 4.0 3.9  Chloride 101 - 111 mmol/L 105 105 103  CO2 22 - 32 mmol/L 28 26 28   Calcium 8.9 - 10.3 mg/dL 9.3 9.5 9.4  Total Protein 6.5 - 8.1 g/dL 6.5 - 7.0  Total Bilirubin 0.3 - 1.2 mg/dL 0.8 - 0.3  Alkaline Phos 38 - 126 U/L 65 - -  AST 15 - 41 U/L 28 - 20  ALT 14 - 54 U/L 25 - 20    Imaging: Xr Knee 3 View Right  Result Date: 05/19/2017 Moderate to severe lateral compartment narrowing no chondrocalcinosis was noted.  Severe patellofemoral narrowing was noted. Impression: Moderate to severe osteoarthritis and severe chondromalacia patella.   Speciality Comments: No specialty comments available.    Procedures:  Large Joint Inj: R knee on 05/19/2017 10:26 AM Indications: pain Details: 27 G 1.5 in needle, medial approach  Arthrogram: No  Medications: 1.5 mL lidocaine (PF) 1 %; 40 mg triamcinolone acetonide 40 MG/ML Aspirate: 0 mL Outcome: tolerated well, no immediate complications Procedure, treatment alternatives, risks and benefits explained, specific risks discussed. Consent was given by the patient. Immediately prior to procedure a time out was called to verify the correct patient, procedure, equipment, support staff and site/side marked as required. Patient was prepped and draped in the usual sterile fashion.     Allergies: Crestor [rosuvastatin calcium] and Asa [aspirin]   Assessment / Plan:     Visit Diagnoses: Idiopathic chronic gout of multiple sites without tophus - Uric acid 4.2 on  11/30/16.  Patient denies any gout flares in the past.  I will check her uric acid level today.  Medication monitoring encounter - Allopurinol 450 mg daily - Plan: CBC with Differential/Platelet, COMPLETE METABOLIC PANEL WITH GFR, Uric acid today and then every 6 months.  She had severe anemia in the past requiring blood transfusion.  Chronic pain of right knee - Plan: XR KNEE 3 VIEW RIGHT.  The x-ray showed moderate to severe lateral compartment narrowing with severe  chondromalacia patella.  We discussed total knee replacement.  She states she is not prepared for that.  Per her request a cortisone injection was given to her right knee joint, procedure as described above.  We also discussed possible Visco supplement injections in the future she will contact us in case she decides to get them.  Weight loss diet and exercise was discussed.  Primary osteoarthritis of both hands: She has severe osteoarthritis in her hands but the pain is tolerable.  Spondylosis of lumbar region without myelopathy or radiculopathy: Chronic pain.  She states she mostly of muscle spasms.  The muscle relaxers have been helpful.  Fibromyalgia: She continues to have some generalized pain the gabapentin has been useful.  Other medical problems are listed as follows:  Chronic pain syndrome  Primary insomnia  Other fatigue  Osteopenia of multiple sites  Diverticulosis of colon  Essential hypertension: Her blood pressure was slightly elevated today.  Patient states that she has not been taking her blood pressure medications.  Anxiety and depression    Orders: Orders Placed This Encounter  Procedures  . Large Joint Inj  . XR KNEE 3 VIEW RIGHT  . CBC with Differential/Platelet  . COMPLETE METABOLIC PANEL WITH GFR  . Uric acid   No orders of the defined types were placed in this encounter.   Face-to-face time spent with patient was 30 minutes.  Greater than 50% of time was spent in counseling and coordination  of care.  Follow-Up Instructions: Return in about 6 months (around 11/19/2017) for , gout, FMS, OA.   Bo Merino, MD  Note - This record has been created using Editor, commissioning.  Chart creation errors have been sought, but may not always  have been located. Such creation errors do not reflect on  the standard of medical care.

## 2017-05-11 DIAGNOSIS — M8588 Other specified disorders of bone density and structure, other site: Secondary | ICD-10-CM | POA: Diagnosis not present

## 2017-05-11 DIAGNOSIS — E2839 Other primary ovarian failure: Secondary | ICD-10-CM | POA: Diagnosis not present

## 2017-05-17 ENCOUNTER — Other Ambulatory Visit: Payer: Self-pay

## 2017-05-17 ENCOUNTER — Ambulatory Visit (AMBULATORY_SURGERY_CENTER): Payer: Self-pay | Admitting: *Deleted

## 2017-05-17 VITALS — Ht 65.5 in | Wt 187.6 lb

## 2017-05-17 DIAGNOSIS — K633 Ulcer of intestine: Secondary | ICD-10-CM

## 2017-05-17 NOTE — Progress Notes (Signed)
No egg or soy allergy known to patient  No issues with past sedation with any surgeries  or procedures, no intubation problems  No diet pills per patient No home 02 use per patient  No blood thinners per patient  Pt denies issues with constipation  No A fib or A flutter  EMMI video sent to pt's e mail -  Pt states she is still having issues with fecal incontinence

## 2017-05-18 ENCOUNTER — Encounter: Payer: Self-pay | Admitting: Internal Medicine

## 2017-05-18 IMAGING — NM NM HEPATO W/GB/PHARM/[PERSON_NAME]
3 series · 13 of 13 positions shown · non-contrast
Comparison: CT 05/12/2015.  Ultrasound 03/11/2015 .

CLINICAL DATA: Gallstones.  Nausea vomiting.  Pain.

EXAM:
NUCLEAR MEDICINE HEPATOBILIARY IMAGING WITH GALLBLADDER EF
TECHNIQUE: Sequential images of the abdomen were obtained [DATE] minutes
following intravenous administration of radiopharmaceutical. After
oral ingestion of Ensure, gallbladder ejection fraction was
determined. At 60 min, normal ejection fraction is greater than 33%.
RADIOPHARMACEUTICALS:  5.2 mCi [id number]  Choletec IV

[he hepatobiliary · 1 of 1 slices shown (1 of 3)]
[im 1/1]
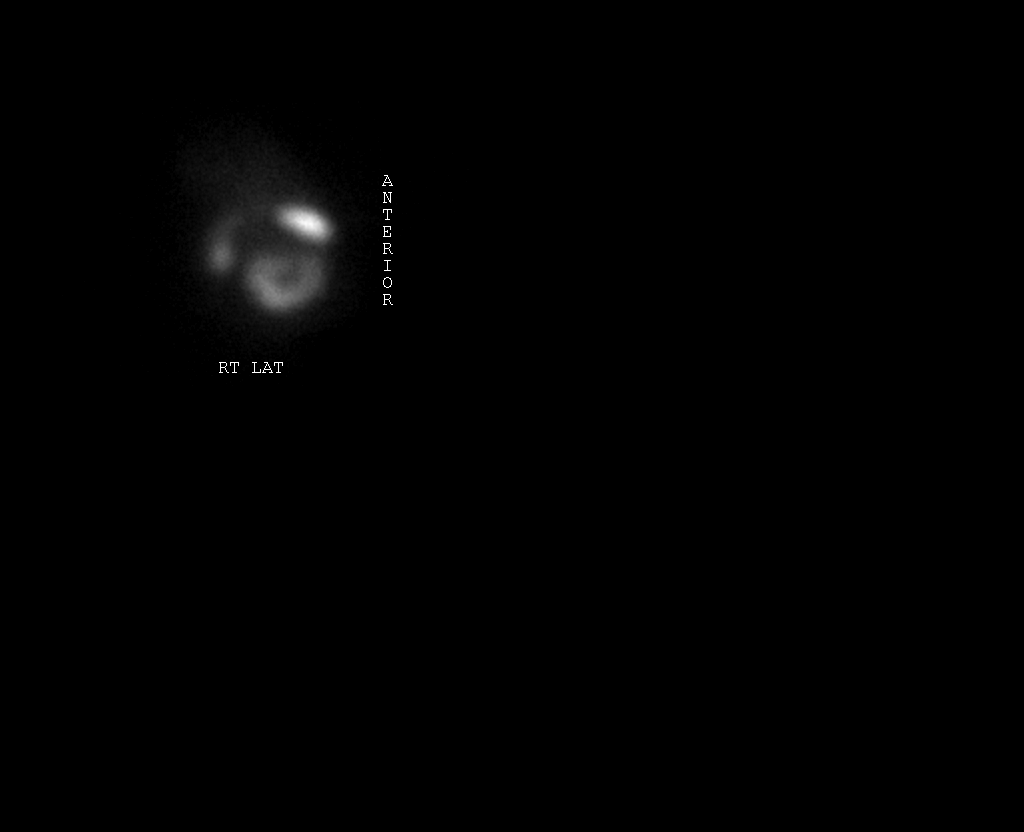

[he hepatobiliary · 3.43mm/px · 6 of 60 frames shown (2 of 3)]
[frame 6/60]
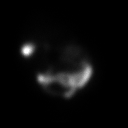
[frame 16/60]
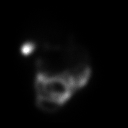
[frame 26/60]
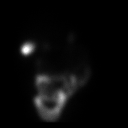
[frame 36/60]
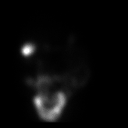
[frame 46/60]
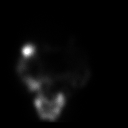
[frame 56/60]
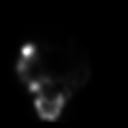

[he hepatobiliary · 3.43mm/px · 6 of 60 frames shown (3 of 3)]
[frame 6/60]
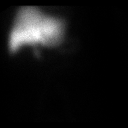
[frame 16/60]
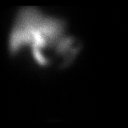
[frame 26/60]
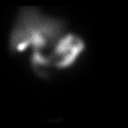
[frame 36/60]
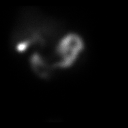
[frame 46/60]
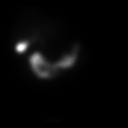
[frame 56/60]
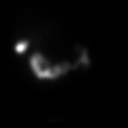

[13 of 13 positions shown; findings below may reference images not displayed]

FINDINGS: Liver, biliary system, gallbladder, bowel visualize normally.
Gallbladder ejection fraction: 18.2%. Normal gallbladder ejection
fraction with Ensure is greater than 33%.
IMPRESSION: Low gallbladder ejection fraction.

## 2017-05-19 ENCOUNTER — Ambulatory Visit: Payer: PPO | Admitting: Rheumatology

## 2017-05-19 ENCOUNTER — Encounter: Payer: Self-pay | Admitting: Physician Assistant

## 2017-05-19 ENCOUNTER — Telehealth: Payer: Self-pay | Admitting: Rheumatology

## 2017-05-19 ENCOUNTER — Ambulatory Visit (INDEPENDENT_AMBULATORY_CARE_PROVIDER_SITE_OTHER): Payer: PPO

## 2017-05-19 VITALS — BP 135/90 | HR 97 | Resp 17 | Ht 65.5 in | Wt 188.0 lb

## 2017-05-19 DIAGNOSIS — M19041 Primary osteoarthritis, right hand: Secondary | ICD-10-CM | POA: Diagnosis not present

## 2017-05-19 DIAGNOSIS — M25561 Pain in right knee: Secondary | ICD-10-CM

## 2017-05-19 DIAGNOSIS — G8929 Other chronic pain: Secondary | ICD-10-CM | POA: Diagnosis not present

## 2017-05-19 DIAGNOSIS — Z5181 Encounter for therapeutic drug level monitoring: Secondary | ICD-10-CM

## 2017-05-19 DIAGNOSIS — G894 Chronic pain syndrome: Secondary | ICD-10-CM

## 2017-05-19 DIAGNOSIS — F419 Anxiety disorder, unspecified: Secondary | ICD-10-CM | POA: Diagnosis not present

## 2017-05-19 DIAGNOSIS — M1A09X0 Idiopathic chronic gout, multiple sites, without tophus (tophi): Secondary | ICD-10-CM

## 2017-05-19 DIAGNOSIS — M797 Fibromyalgia: Secondary | ICD-10-CM

## 2017-05-19 DIAGNOSIS — F5101 Primary insomnia: Secondary | ICD-10-CM

## 2017-05-19 DIAGNOSIS — M19042 Primary osteoarthritis, left hand: Secondary | ICD-10-CM

## 2017-05-19 DIAGNOSIS — M47816 Spondylosis without myelopathy or radiculopathy, lumbar region: Secondary | ICD-10-CM | POA: Diagnosis not present

## 2017-05-19 DIAGNOSIS — K5730 Diverticulosis of large intestine without perforation or abscess without bleeding: Secondary | ICD-10-CM

## 2017-05-19 DIAGNOSIS — R5383 Other fatigue: Secondary | ICD-10-CM

## 2017-05-19 DIAGNOSIS — M8589 Other specified disorders of bone density and structure, multiple sites: Secondary | ICD-10-CM

## 2017-05-19 DIAGNOSIS — I10 Essential (primary) hypertension: Secondary | ICD-10-CM | POA: Diagnosis not present

## 2017-05-19 DIAGNOSIS — F329 Major depressive disorder, single episode, unspecified: Secondary | ICD-10-CM

## 2017-05-19 DIAGNOSIS — M1711 Unilateral primary osteoarthritis, right knee: Secondary | ICD-10-CM | POA: Insufficient documentation

## 2017-05-19 LAB — CBC WITH DIFFERENTIAL/PLATELET
BASOS ABS: 28 {cells}/uL (ref 0–200)
Basophils Relative: 0.4 %
EOS ABS: 213 {cells}/uL (ref 15–500)
EOS PCT: 3 %
HCT: 37.4 % (ref 35.0–45.0)
HEMOGLOBIN: 12.5 g/dL (ref 11.7–15.5)
Lymphs Abs: 2251 cells/uL (ref 850–3900)
MCH: 30.3 pg (ref 27.0–33.0)
MCHC: 33.4 g/dL (ref 32.0–36.0)
MCV: 90.8 fL (ref 80.0–100.0)
MONOS PCT: 6.2 %
MPV: 9.5 fL (ref 7.5–12.5)
NEUTROS ABS: 4168 {cells}/uL (ref 1500–7800)
Neutrophils Relative %: 58.7 %
PLATELETS: 300 10*3/uL (ref 140–400)
RBC: 4.12 10*6/uL (ref 3.80–5.10)
RDW: 13.3 % (ref 11.0–15.0)
TOTAL LYMPHOCYTE: 31.7 %
WBC mixed population: 440 cells/uL (ref 200–950)
WBC: 7.1 10*3/uL (ref 3.8–10.8)

## 2017-05-19 LAB — COMPLETE METABOLIC PANEL WITH GFR
AG Ratio: 1.4 (calc) (ref 1.0–2.5)
ALBUMIN MSPROF: 4.1 g/dL (ref 3.6–5.1)
ALKALINE PHOSPHATASE (APISO): 74 U/L (ref 33–130)
ALT: 14 U/L (ref 6–29)
AST: 14 U/L (ref 10–35)
BILIRUBIN TOTAL: 0.3 mg/dL (ref 0.2–1.2)
BUN: 15 mg/dL (ref 7–25)
CHLORIDE: 102 mmol/L (ref 98–110)
CO2: 33 mmol/L — AB (ref 20–32)
CREATININE: 0.95 mg/dL (ref 0.50–0.99)
Calcium: 10.1 mg/dL (ref 8.6–10.4)
GFR, Est African American: 71 mL/min/{1.73_m2} (ref >=60)
GFR, Est Non African American: 62 mL/min/{1.73_m2} (ref >=60)
GLUCOSE: 122 mg/dL — AB (ref 65–99)
Globulin: 3 g/dL (calc) (ref 1.9–3.7)
Potassium: 4.3 mmol/L (ref 3.5–5.3)
Sodium: 142 mmol/L (ref 135–146)
Total Protein: 7.1 g/dL (ref 6.1–8.1)

## 2017-05-19 LAB — URIC ACID: URIC ACID, SERUM: 4.7 mg/dL (ref 2.5–7.0)

## 2017-05-19 MED ORDER — LIDOCAINE HCL (PF) 1 % IJ SOLN
1.5000 mL | INTRAMUSCULAR | Status: AC | PRN
  Administered 2017-05-19: 1.5 mL

## 2017-05-19 MED ORDER — TRIAMCINOLONE ACETONIDE 40 MG/ML IJ SUSP
40.0000 mg | INTRAMUSCULAR | Status: AC | PRN
  Administered 2017-05-19: 40 mg via INTRA_ARTICULAR

## 2017-05-19 NOTE — Telephone Encounter (Signed)
Patient states she forgot to request a prescription refill for muscle relaxers at her office visit this morning.  Patient states she would like a prescription that she can take 3 times/day.  Patient's pharmacy is [facility] on [address]

## 2017-05-22 MED ORDER — BACLOFEN 10 MG PO TABS: 10.0000 mg | ORAL_TABLET | Freq: Three times a day (TID) | ORAL | 1 refills | Status: DC | PRN

## 2017-05-22 NOTE — Progress Notes (Signed)
WNL

## 2017-05-22 NOTE — Addendum Note (Signed)
Addended by: Carole Binning on: 05/22/2017 12:39 PM   Modules accepted: Orders

## 2017-05-22 NOTE — Telephone Encounter (Signed)
She had a rx of Baclofen 10 mg po tid prn if she wants it then you may give the rx for 30d supply and one refill.

## 2017-05-31 ENCOUNTER — Other Ambulatory Visit: Payer: Self-pay

## 2017-05-31 ENCOUNTER — Encounter: Payer: Self-pay | Admitting: Internal Medicine

## 2017-05-31 ENCOUNTER — Ambulatory Visit (AMBULATORY_SURGERY_CENTER): Payer: PPO | Admitting: Internal Medicine

## 2017-05-31 VITALS — BP 151/99 | HR 75 | Temp 98.0°F | Resp 16 | Ht 65.0 in | Wt 187.0 lb

## 2017-05-31 DIAGNOSIS — G4733 Obstructive sleep apnea (adult) (pediatric): Secondary | ICD-10-CM | POA: Diagnosis not present

## 2017-05-31 DIAGNOSIS — E669 Obesity, unspecified: Secondary | ICD-10-CM | POA: Diagnosis not present

## 2017-05-31 DIAGNOSIS — I10 Essential (primary) hypertension: Secondary | ICD-10-CM | POA: Diagnosis not present

## 2017-05-31 DIAGNOSIS — D122 Benign neoplasm of ascending colon: Secondary | ICD-10-CM

## 2017-05-31 DIAGNOSIS — K633 Ulcer of intestine: Secondary | ICD-10-CM

## 2017-05-31 DIAGNOSIS — R197 Diarrhea, unspecified: Secondary | ICD-10-CM | POA: Diagnosis not present

## 2017-05-31 DIAGNOSIS — T184XXA Foreign body in colon, initial encounter: Secondary | ICD-10-CM

## 2017-05-31 DIAGNOSIS — E119 Type 2 diabetes mellitus without complications: Secondary | ICD-10-CM | POA: Diagnosis not present

## 2017-05-31 MED ORDER — SODIUM CHLORIDE 0.9 % IV SOLN: 500.0000 mL | Freq: Once | INTRAVENOUS | Status: DC

## 2017-05-31 MED ORDER — AMOXICILLIN-POT CLAVULANATE 875-125 MG PO TABS: 1.0000 | ORAL_TABLET | Freq: Two times a day (BID) | ORAL | 0 refills | Status: DC

## 2017-05-31 NOTE — Patient Instructions (Addendum)
The ulcer is healed.  I found a tiny polyp and removed it.  There was what I think was a toothpick lodged in the colon - I removed it. This could have led to a perforation and abscess. Do not know if causing the diarrhea - will see.  I took colon lining biopsies because of diarrhea complaints.  I am treating you with antibiotics for 5 days to reduce risk of infection due to the toothpick that was lodged.  I appreciate the opportunity to care for you. Gatha Mayer, MD, FACG YOU HAD AN ENDOSCOPIC PROCEDURE TODAY AT [facility]:   Refer to the procedure report that was given to you for any specific questions about what was found during the examination.  If the procedure report does not answer your questions, please call your gastroenterologist to clarify.  If you requested that your care partner not be given the details of your procedure findings, then the procedure report has been included in a sealed envelope for you to review at your convenience later.  YOU SHOULD EXPECT: Some feelings of bloating in the abdomen. Passage of more gas than usual.  Walking can help get rid of the air that was put into your GI tract during the procedure and reduce the bloating. If you had a lower endoscopy (such as a colonoscopy or flexible sigmoidoscopy) you may notice spotting of blood in your stool or on the toilet paper. If you underwent a bowel prep for your procedure, you may not have a normal bowel movement for a few days.  Please Note:  You might notice some irritation and congestion in your nose or some drainage.  This is from the oxygen used during your procedure.  There is no need for concern and it should clear up in a day or so.  SYMPTOMS TO REPORT IMMEDIATELY:   Following lower endoscopy (colonoscopy or flexible sigmoidoscopy):  Excessive amounts of blood in the stool  Significant tenderness or worsening of abdominal pains  Swelling of the abdomen that is new,  acute  Fever of 100F or higher  For urgent or emergent issues, a gastroenterologist can be reached at any hour by calling [phone].   DIET:  We do recommend a small meal at first, but then you may proceed to your regular diet.  Drink plenty of fluids but you should avoid alcoholic beverages for 24 hours.  ACTIVITY:  You should plan to take it easy for the rest of today and you should NOT DRIVE or use heavy machinery until tomorrow (because of the sedation medicines used during the test).    FOLLOW UP: Our staff will call the number listed on your records the next business day following your procedure to check on you and address any questions or concerns that you may have regarding the information given to you following your procedure. If we do not reach you, we will leave a message.  However, if you are feeling well and you are not experiencing any problems, there is no need to return our call.  We will assume that you have returned to your regular daily activities without incident.  If any biopsies were taken you will be contacted by phone or by letter within the next 1-3 weeks.  Please call us at [phone] if you have not heard about the biopsies in 3 weeks.   Await for biopsy results to determine next repeat Colonoscopy screening Polyps (handout given) Augmentin generic 875 mg twice a  day as prophylaxis due to removal of embedded foreign body  SIGNATURES/CONFIDENTIALITY: You and/or your care partner have signed paperwork which will be entered into your electronic medical record.  These signatures attest to the fact that that the information above on your After Visit Summary has been reviewed and is understood.  Full responsibility of the confidentiality of this discharge information lies with you and/or your care-partner.

## 2017-05-31 NOTE — Op Note (Addendum)
[facility] Patient Name: Tammy Mullins Procedure Date: 05/31/2017 10:52 AM MRN: [id number] Endoscopist: Gatha Mayer , MD Age: 67 Referring MD:  Date of Birth: 09/21/1950 Gender: Female Account #: [id number] Procedure:                Colonoscopy Indications:              follow-up ileocecal valve ulcer Medicines:                Propofol per Anesthesia, Monitored Anesthesia Care Procedure:                Pre-Anesthesia Assessment:                           - Prior to the procedure, a History and Physical                            was performed, and patient medications and                            allergies were reviewed. The patient's tolerance of                            previous anesthesia was also reviewed. The risks                            and benefits of the procedure and the sedation                            options and risks were discussed with the patient.                            All questions were answered, and informed consent                            was obtained. Prior Anticoagulants: The patient has                            taken no previous anticoagulant or antiplatelet                            agents. ASA Grade Assessment: III - A patient with                            severe systemic disease. After reviewing the risks                            and benefits, the patient was deemed in                            satisfactory condition to undergo the procedure.                           After obtaining informed consent, the colonoscope  was passed under direct vision. Throughout the                            procedure, the patient's blood pressure, pulse, and                            oxygen saturations were monitored continuously. The                            Model PCF-[id number]) scope was introduced                            through the anus and advanced to the the terminal     ileum, with identification of the appendiceal                            orifice and IC valve. The colonoscopy was performed                            without difficulty. The patient tolerated the                            procedure well. The quality of the bowel                            preparation was excellent. The terminal ileum,                            ileocecal valve, appendiceal orifice, and rectum                            were photographed. The bowel preparation used was                            Miralax. Scope In: 11:03:30 AM Scope Out: 11:24:33 AM Scope Withdrawal Time: 0 hours 18 minutes 7 seconds  Total Procedure Duration: 0 hours 21 minutes 3 seconds  Findings:                 The perianal and digital rectal examinations were                            normal. Pertinent negatives include normal                            anorectal function and sphincter tone.                           A diminutive polyp was found in the ascending                            colon. The polyp was sessile. The polyp was removed                            with a cold snare.  Resection and retrieval were                            complete. Verification of patient identification                            for the specimen was done. Estimated blood loss was                            minimal.                           The terminal ileum appeared normal.                           A foreign body was found in the distal sigmoid                            colon. Embedded at 2 points with granulation tissue                            surrounding. Could be diverticulosis in area though                            none seen. Removal of suspected toothpick was                            accomplished with a regular forceps. Verification                            of patient identification for the specimen was                            done. Estimated blood loss was minimal.                            The exam was otherwise without abnormality on                            direct and retroflexion views.                           Biopsies for histology were taken with a cold                            forceps from the right colon and left colon for                            evaluation of microscopic colitis. Complications:            No immediate complications. Estimated Blood Loss:     Estimated blood loss was minimal. Impression:               - One diminutive polyp in the ascending colon,  removed with a cold snare. Resected and retrieved.                           - The examined portion of the ileum was normal.                           - Foreign body in the distal sigmoid colon. Removal                            was successful.                           - The examination was otherwise normal on direct                            and retroflexion views.                           - Biopsies were taken with a cold forceps from the                            right colon and left colon for evaluation of                            microscopic colitis. She had new uncontrollable                            diarrhea with fecal incontinence Recommendation:           - Patient has a contact number available for                            emergencies. The signs and symptoms of potential                            delayed complications were discussed with the                            patient. Return to normal activities tomorrow.                            Written discharge instructions were provided to the                            patient.                           - Resume previous diet.                           - Continue present medications.                           - Await pathology results.                           -  Repeat colonoscopy is recommended for                            surveillance. The colonoscopy date will be                             determined after pathology results from today's                            exam become available for review.                           - Augmentin generic 875 mg bid as prophylaxis due                            to removal of embedded foreign body. Rx sent. Gatha Mayer, MD 05/31/2017 11:38:59 AM This report has been signed electronically.

## 2017-05-31 NOTE — Progress Notes (Signed)
To recovery, report to RN, VSS. 

## 2017-05-31 NOTE — Progress Notes (Signed)
Pt's states no medical or surgical changes since previsit or office visit. 

## 2017-05-31 NOTE — Progress Notes (Signed)
Called to room to assist during endoscopic procedure.  Patient ID and intended procedure confirmed with present staff. Received instructions for my participation in the procedure from the performing physician.  

## 2017-06-01 ENCOUNTER — Telehealth: Payer: Self-pay

## 2017-06-01 NOTE — Telephone Encounter (Signed)
NO ANSWER, MESSAGE LEFT FOR PATIENT. 

## 2017-06-01 NOTE — Telephone Encounter (Signed)
  Follow up Call-  Call back number 05/31/2017 08/02/2016  Post procedure Call Back phone  # [phone]  Permission to leave phone message Yes Yes  Some recent data might be hidden     Patient questions:  Do you have a fever, pain , or abdominal swelling? No. Pain Score  0 *  Have you tolerated food without any problems? Yes.    Have you been able to return to your normal activities? Yes.    Do you have any questions about your discharge instructions: Diet   No. Medications  No. Follow up visit  No.  Do you have questions or concerns about your Care? No.  Actions: * If pain score is 4 or above: No action needed, pain <4.

## 2017-06-07 DIAGNOSIS — H43813 Vitreous degeneration, bilateral: Secondary | ICD-10-CM | POA: Diagnosis not present

## 2017-06-07 DIAGNOSIS — H04123 Dry eye syndrome of bilateral lacrimal glands: Secondary | ICD-10-CM | POA: Diagnosis not present

## 2017-06-07 DIAGNOSIS — H2513 Age-related nuclear cataract, bilateral: Secondary | ICD-10-CM | POA: Diagnosis not present

## 2017-06-08 NOTE — Progress Notes (Signed)
Call from office [place] - 5 year recall  1) polyp benign but pre-cancerous so repeat colon 2024 2) colon biopsies normal - no colitis so no inflammation causing diarrhea 3) ask if still having diarrhea and let me know

## 2017-06-19 ENCOUNTER — Telehealth: Payer: Self-pay | Admitting: Rheumatology

## 2017-06-19 MED ORDER — DICLOFENAC SODIUM 1 % TD GEL: TRANSDERMAL | 3 refills | Status: DC

## 2017-06-19 NOTE — Telephone Encounter (Signed)
I called patient and discussed that the muscle relaxer dose can not be increased. She voiced understanding.

## 2017-06-19 NOTE — Telephone Encounter (Signed)
Advised patient we do not rx narcotics. Okay to send in voltaren gel prescription, per Lovena Le. Patient agreed to try Voltaren gel and take tylenol prn. Patient states the muscle relaxers are not strong enough and have not been helping. Any further recommendations?

## 2017-06-19 NOTE — Telephone Encounter (Signed)
Patient calling requesting a rx for pain medicine for her knee. Patient uses [facility] on [address]. Please call to advise.

## 2017-07-12 DIAGNOSIS — M8580 Other specified disorders of bone density and structure, unspecified site: Secondary | ICD-10-CM | POA: Diagnosis not present

## 2017-07-12 DIAGNOSIS — M25511 Pain in right shoulder: Secondary | ICD-10-CM | POA: Diagnosis not present

## 2017-07-12 DIAGNOSIS — I10 Essential (primary) hypertension: Secondary | ICD-10-CM | POA: Diagnosis not present

## 2017-07-12 DIAGNOSIS — E2839 Other primary ovarian failure: Secondary | ICD-10-CM | POA: Diagnosis not present

## 2017-07-12 DIAGNOSIS — K589 Irritable bowel syndrome without diarrhea: Secondary | ICD-10-CM | POA: Diagnosis not present

## 2017-07-12 DIAGNOSIS — K219 Gastro-esophageal reflux disease without esophagitis: Secondary | ICD-10-CM | POA: Diagnosis not present

## 2017-07-12 DIAGNOSIS — Z6830 Body mass index (BMI) 30.0-30.9, adult: Secondary | ICD-10-CM | POA: Diagnosis not present

## 2017-07-12 DIAGNOSIS — D509 Iron deficiency anemia, unspecified: Secondary | ICD-10-CM | POA: Diagnosis not present

## 2017-07-17 ENCOUNTER — Other Ambulatory Visit: Payer: Self-pay | Admitting: Rheumatology

## 2017-07-17 NOTE — Telephone Encounter (Signed)
Last Visit: 05/19/17 Next Visit: 11/21/17  Okay to refill per Dr. Estanislado Pandy

## 2017-07-18 NOTE — Progress Notes (Signed)
Office Visit Note  Patient: Tammy Mullins             Date of Birth: January 22, 1951           MRN: [id number]             PCP: Maury Dus, MD Referring: Maury Dus, MD Visit Date: 07/19/2017 Occupation: @GUAROCC @    Subjective:  Right shoulder pain   History of Present Illness: Tammy Mullins is a 67 y.o. female with history of gout, osteoarthritis, and fibromyalgia.  Patient presents today with right shoulder pain.  She reports that she had right rotator cuff repair in 2004.  She states that her right shoulder has been having discomfort for the past 2 weeks.  She denies any recent injuries or falls.  She states she is having pain with range of motion as well as pain at night when lying on her shoulder.  She states she is tried using Voltaren gel without any relief.  She states that she has not tried using ice for pain relief.  Patient had a left knee cortisone injection on 05/19/2017 which has helped her knee pain significantly.  She is also been using Voltaren gel for pain relief.  She states that she has been having pain and swelling in her right hand that started about 2 days ago.  She continues to take allopurinol 450 mg by mouth daily and has been taking colchicine 0.6 mg by mouth for the past 2 days.  She typically takes colchicine on a as needed basis. She reports that her fibromyalgia has been stable.  She denies any recent viral flares.  She states her insomnia has improved.  She states that she feels her fatigue has been worsening due to her increased pain level of her right shoulder.  She continues to take baclofen as needed and gabapentin 600 mg at bedtime.    Activities of Daily Living:  Patient reports morning stiffness for 45 minutes.   Patient Reports nocturnal pain.  Difficulty dressing/grooming: Reports Difficulty climbing stairs: Denies Difficulty getting out of chair: Denies Difficulty using hands for taps, buttons, cutlery, and/or writing: Reports   Review of  Systems  Constitutional: Negative for fatigue.  HENT: Positive for mouth dryness. Negative for mouth sores and nose dryness.   Eyes: Negative for pain, visual disturbance and dryness.  Respiratory: Negative for cough, hemoptysis, shortness of breath and difficulty breathing.   Cardiovascular: Negative for chest pain, palpitations, hypertension and swelling in legs/feet.  Gastrointestinal: Positive for diarrhea. Negative for blood in stool and constipation.  Endocrine: Negative for excessive thirst and increased urination.  Genitourinary: Negative for difficulty urinating and painful urination.  Musculoskeletal: Positive for arthralgias, joint pain, joint swelling, muscle weakness, morning stiffness and muscle tenderness. Negative for myalgias and myalgias.  Skin: Positive for rash. Negative for color change, pallor, hair loss, nodules/bumps, skin tightness, ulcers and sensitivity to sunlight.  Allergic/Immunologic: Negative for susceptible to infections.  Neurological: Negative for dizziness, numbness and headaches.  Hematological: Negative for bruising/bleeding tendency and swollen glands.  Psychiatric/Behavioral: Positive for sleep disturbance. Negative for depressed mood. The patient is not nervous/anxious.     PMFS History:  Patient Active Problem List   Diagnosis Date Noted  . Primary osteoarthritis of right knee 05/19/2017  . Anemia 12/03/2016  . 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 History of gastroesophageal reflux (GERD) 04/19/2016  . History of hypertension 04/19/2016  . Other fatigue 03/21/2016  . Primary insomnia 03/21/2016  . History of gout  03/21/2016  . Osteoarthritis of lumbar spine 03/21/2016  . Dyslipidemia 03/21/2016  . History of IBS 03/21/2016  . History of depression 03/21/2016  . History of cholelithiasis 03/21/2016  . Pain management 03/21/2016  . Diabetes type 2, controlled ([place]) 01/09/2014  . Gout 09/20/2013  . OSA (obstructive sleep apnea) 09/16/2013  .  Sleep disturbance 06/27/2013  . Chest pain, unspecified 06/06/2012  . Vaginal atrophy 03/15/2012  . Osteopenia 03/15/2012  . Post-menopausal 03/15/2012  . Vitamin D deficiency 11/04/2010  . Depression 10/20/2007  . Chronic pain syndrome 10/20/2007  . Allergic rhinitis 10/20/2007  . HYPERCHOLESTEROLEMIA 10/03/2007  . History of cardiovascular disorder 10/03/2007  . Anxiety state 04/20/2007  . 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/2007  . Essential hypertension 04/20/2007  . GERD 04/20/2007  . Irritable bowel syndrome 04/20/2007  . Primary osteoarthritis of both hands 04/20/2007  . DE QUERVAIN'S TENOSYNOVITIS 04/20/2007  . Fibromyalgia 04/20/2007  . Diverticulosis of colon 01/09/2001    Past Medical History:  Diagnosis Date  . Allergic rhinitis, cause unspecified   . Allergy   . Anxiety state, unspecified   . Arthritis   . Attention deficit disorder without mention of hyperactivity   . Barrett's esophagus   . Blood transfusion without reported diagnosis 2018-10  . Chronic pain syndrome   . 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 Depression   . Diabetes type 2, controlled ([place]) 01/09/2014  . Diverticulosis of colon (without mention of hemorrhage)   . Esophageal reflux   . Fibromyalgia   . Gout, unspecified   . Hiatal hernia   . Irritable bowel syndrome   . 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 Myalgia and myositis, unspecified   . Osteoarthrosis, unspecified whether generalized or localized, unspecified site   . Personal history of unspecified circulatory disease   . Pure hypercholesterolemia   . Radial styloid tenosynovitis   . Unspecified essential hypertension   . Unspecified nonpsychotic mental disorder   . Unspecified pruritic disorder     Family History  Problem Relation Age of Onset  . Cirrhosis Father   . Cancer Maternal Grandmother        ?  . Lung disease Mother        ?   . Glaucoma Mother   . Glaucoma Brother   . Prostate cancer Brother   .  Heart disease Sister   . Kidney disease Neg Hx   . Diabetes Neg Hx   . Colon cancer Neg Hx   . Colon polyps Neg Hx   . Rectal cancer Neg Hx   . Stomach cancer Neg Hx    Past Surgical History:  Procedure Laterality Date  . ANKLE SURGERY Right   . APPENDECTOMY    . CARPAL TUNNEL RELEASE Right   . CERVICAL SPINE SURGERY     plates and pins  . CHOLECYSTECTOMY N/A 06/26/2015   Procedure: LAPAROSCOPIC CHOLECYSTECTOMY;  Surgeon: Ralene Ok, MD;  Location: [facility];  Service: General;  Laterality: N/A;  . COLONOSCOPY    . ESOPHAGOGASTRODUODENOSCOPY    . ESOPHAGOGASTRODUODENOSCOPY N/A 12/04/2016   Procedure: ESOPHAGOGASTRODUODENOSCOPY (EGD);  Surgeon: Ladene Artist, MD;  Location: [facility] ENDOSCOPY;  Service: Endoscopy;  Laterality: N/A;  . LAPAROSCOPIC APPENDECTOMY N/A 06/26/2015   Procedure: LAPAROSCOPIC APPENDECTOMY;  Surgeon: Ralene Ok, MD;  Location: [place];  Service: General;  Laterality: N/A;  . ROTATOR CUFF REPAIR Right   . SALPINGOOPHORECTOMY  Left   ectopic 1980  . TOTAL SHOULDER ARTHROPLASTY    .  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  [place]        Objective: Vital Signs: BP 106/73 (BP Location: Left Arm, Patient Position: Sitting, Cuff Size: Normal)   Pulse 86   Resp 18   Ht 5' 5.5" (1.664 m)   Wt 184 lb (83.5 kg)   BMI 30.15 kg/m    Physical Exam  Constitutional: She is oriented to person, place, and time. She appears well-developed and well-nourished.  HENT:  Head: Normocephalic and atraumatic.  Eyes: Conjunctivae and EOM are normal.  Neck: Normal range of motion.  Cardiovascular: Normal rate, regular rhythm, normal heart sounds  and intact distal pulses.  Pulmonary/Chest: Effort normal and breath sounds normal.  Abdominal: Soft. Bowel sounds are normal.  Lymphadenopathy:    She has no cervical adenopathy.  Neurological: She is alert and oriented to person, place, and time.  Skin: Skin is warm and dry. Capillary refill takes less than 2 seconds.  Psychiatric: She has a normal mood and affect. Her behavior is normal.  Nursing note and vitals reviewed.   Musculoskeletal Exam: C-spine limited range of motion.  Thoracic and lumbar spine good range of motion.  No midline spinal tenderness.  No SI joint tenderness.  She has full range of motion of bilateral shoulders.  She has discomfort with right shoulder range of motion.  I will joints, wrist joints, MCPs, PIPs, DIPs good range of motion with no synovitis.  She has PIP and DIP synovial thickening consistent with osteoarthritis of bilateral hands.  Hip joints, knee joints, ankle joints, MTPs, PIPs, DIPs good range of motion with no synovitis.  She has slightly limited extension of her right knee with some discomfort.  No warmth or effusion of knee joints.  No tenderness of trochanteric bursa.  CDAI Exam: No CDAI exam completed.    Investigation: No additional findings.Uric acid: 05/19/2017 4.7 CBC Latest Ref Rng & Units 05/19/2017 12/05/2016 12/04/2016  WBC 3.8 - 10.8 Thousand/uL 7.1 7.8 6.9  Hemoglobin 11.7 - 15.5 g/dL 12.5 8.9(L) 8.7(L)  Hematocrit 35.0 - 45.0 % 37.4 29.9(L) 28.9(L)  Platelets 140 - 400 Thousand/uL 300 342 340   CMP Latest Ref Rng & Units 05/19/2017 12/04/2016 12/03/2016  Glucose 65 - 99 mg/dL 122(H) 104(H) 115(H)  BUN 7 - 25 mg/dL 15 10 15   Creatinine 0.50 - 0.99 mg/dL 0.95 0.75 0.82  Sodium 135 - 146 mmol/L 142 140 139  Potassium 3.5 - 5.3 mmol/L 4.3 3.9 4.0  Chloride 98 - 110 mmol/L 102 105 105  CO2 20 - 32 mmol/L 33(H) 28 26  Calcium 8.6 - 10.4 mg/dL 10.1 9.3 9.5  Total Protein 6.1 - 8.1 g/dL 7.1 6.5 -  Total Bilirubin 0.2 - 1.2 mg/dL 0.3 0.8 -    Alkaline Phos 38 - 126 U/L - 65 -  AST 10 - 35 U/L 14 28 -  ALT 6 - 29 U/L 14 25 -    Imaging: No results found.  Speciality Comments: No specialty comments available.    Procedures:  Large Joint Inj: R glenohumeral on 07/19/2017 10:53 AM Indications: pain Details: 27 G 1.5 in needle, posterior approach  Arthrogram: No  Medications: 1.5 mL lidocaine 1 %; 40 mg triamcinolone acetonide 40 MG/ML Aspirate: 0 mL Outcome: tolerated well, no immediate complications Procedure, treatment alternatives, risks and benefits explained, specific risks discussed. Consent was given by the patient. Immediately prior to procedure a time out was called to verify the correct patient, procedure, equipment, support staff and site/side marked as required. Patient was prepped and draped in the usual sterile fashion.     Allergies: Crestor [rosuvastatin calcium] and Asa [aspirin]   Assessment / Plan:     Visit Diagnoses: Idiopathic chronic gout of multiple sites without tophus - She has no had any recent gout flares.  She is having pain in her right hand today, which is likely due to osteoarthritis.  She has no synovitis on exam.  She has PIP and DIP synovial thickening consistent with osteoarthritis.  She will continue taking Allopurinol 450 mg daily and Colchcine 0.6 mg PRN.  Her Uric acid on 05/19/2017 was 4.7.  She will have her next uric acid level check in September and every 6 months.  She does not need any refills at this time.    Medication monitoring encounter - Allopurinol 450 mg daily and Colchicine 0.6 mg.  Labs will be repeated in September.    Chronic right shoulder pain: She has been having right shoulder pain for the past 2 weeks.  She has pain with range of motion as well as pain at night.  She has tried using Voltaren gel with no relief.  She has not had any recent injuries or falls but had rotator cuff repair in 2004.  She requested a cortisone injection today in the office.  Potential  side effects were discussed.  She tolerated the procedure well.  She was given a handout of shoulder exercises that she can perform at home.  Primary osteoarthritis of both hands: She has PIP and DIP synovial thickening consistent with osteoarthritis of bilateral hands.  Joint protection muscle strengthening were discussed.  She was given a handout of hand exercises that she can perform at home.  At home she has been using a stress ball to work on muscle strengthening.  Fibromyalgia: Her fibromyalgia has been stable.  She has not any recent fibromyalgia flares.  She continues to take gabapentin 600 mg at bedtime as well as baclofen 10 mg as needed.  She feels as though her insomnia is improved but she continues to have chronic fatigue.  She was encouraged to continue to stay active an exercise on a regular basis.    Chronic pain syndrome  Spondylosis of lumbar region without myelopathy or radiculopathy: No midline spinal tenderness or lower back pain at this time.    Other fatigue: Chronic   Primary insomnia: Improved.  Osteopenia of multiple sites  History of hypertension  Anxiety and depression  History of diverticulosis   Orders: Orders Placed This Encounter  Procedures  . Large Joint Inj   No orders of the defined types were placed in this encounter.   Face-to-face time spent with patient was 30 minutes.> 50% of time was spent in counseling and coordination of care.  Follow-Up Instructions: Return in about 6 months (around 01/19/2018) for Gout, Osteoarthritis, Fibromyalgia.   Ofilia Neas, PA-C I examined and evaluated the patient with Hazel Sams PA.  Patient had painful range of motion of her right shoulder which discomfort.  These findings were consistent with shoulder joint bursitis.  After informed consent was obtained the right shoulder joint was injected  as described above.  The plan of care was discussed as noted above.  Bo Merino, MD Note - This record has  been created using Editor, commissioning.  Chart creation errors have been sought, but may not always  have been located. Such creation errors do not reflect on  the standard of medical care.

## 2017-07-19 ENCOUNTER — Ambulatory Visit: Payer: PPO | Admitting: Rheumatology

## 2017-07-19 ENCOUNTER — Encounter: Payer: Self-pay | Admitting: Rheumatology

## 2017-07-19 VITALS — BP 106/73 | HR 86 | Resp 18 | Ht 65.5 in | Wt 184.0 lb

## 2017-07-19 DIAGNOSIS — M19041 Primary osteoarthritis, right hand: Secondary | ICD-10-CM

## 2017-07-19 DIAGNOSIS — F419 Anxiety disorder, unspecified: Secondary | ICD-10-CM | POA: Diagnosis not present

## 2017-07-19 DIAGNOSIS — Z5181 Encounter for therapeutic drug level monitoring: Secondary | ICD-10-CM

## 2017-07-19 DIAGNOSIS — M1A09X0 Idiopathic chronic gout, multiple sites, without tophus (tophi): Secondary | ICD-10-CM

## 2017-07-19 DIAGNOSIS — R5383 Other fatigue: Secondary | ICD-10-CM | POA: Diagnosis not present

## 2017-07-19 DIAGNOSIS — Z8719 Personal history of other diseases of the digestive system: Secondary | ICD-10-CM

## 2017-07-19 DIAGNOSIS — G894 Chronic pain syndrome: Secondary | ICD-10-CM

## 2017-07-19 DIAGNOSIS — M797 Fibromyalgia: Secondary | ICD-10-CM | POA: Diagnosis not present

## 2017-07-19 DIAGNOSIS — Z8679 Personal history of other diseases of the circulatory system: Secondary | ICD-10-CM | POA: Diagnosis not present

## 2017-07-19 DIAGNOSIS — F329 Major depressive disorder, single episode, unspecified: Secondary | ICD-10-CM

## 2017-07-19 DIAGNOSIS — G8929 Other chronic pain: Secondary | ICD-10-CM | POA: Diagnosis not present

## 2017-07-19 DIAGNOSIS — M8589 Other specified disorders of bone density and structure, multiple sites: Secondary | ICD-10-CM

## 2017-07-19 DIAGNOSIS — F5101 Primary insomnia: Secondary | ICD-10-CM

## 2017-07-19 DIAGNOSIS — M19042 Primary osteoarthritis, left hand: Secondary | ICD-10-CM

## 2017-07-19 DIAGNOSIS — M25511 Pain in right shoulder: Secondary | ICD-10-CM

## 2017-07-19 DIAGNOSIS — M47816 Spondylosis without myelopathy or radiculopathy, lumbar region: Secondary | ICD-10-CM | POA: Diagnosis not present

## 2017-07-19 MED ORDER — LIDOCAINE HCL 1 % IJ SOLN
1.5000 mL | INTRAMUSCULAR | Status: AC | PRN
  Administered 2017-07-19: 1.5 mL

## 2017-07-19 MED ORDER — TRIAMCINOLONE ACETONIDE 40 MG/ML IJ SUSP
40.0000 mg | INTRAMUSCULAR | Status: AC | PRN
  Administered 2017-07-19: 40 mg via INTRA_ARTICULAR

## 2017-07-19 NOTE — Patient Instructions (Signed)
Hand Exercises Hand exercises can be helpful to almost anyone. These exercises can strengthen the hands, improve flexibility and movement, and increase blood flow to the hands. These results can make work and daily tasks easier. Hand exercises can be especially helpful for people who have joint pain from arthritis or have nerve damage from overuse (carpal tunnel syndrome). These exercises can also help people who have injured a hand. Most of these hand exercises are fairly gentle stretching routines. You can do them often throughout the day. Still, it is a good idea to ask your health care provider which exercises would be best for you. Warming your hands before exercise may help to reduce stiffness. You can do this with gentle massage or by placing your hands in warm water for 15 minutes. Also, make sure you pay attention to your level of hand pain as you begin an exercise routine. Exercises Knuckle Bend Repeat this exercise 5-10 times with each hand. 1. Stand or sit with your arm, hand, and all five fingers pointed straight up. Make sure your wrist is straight. 2. Gently and slowly bend your fingers down and inward until the tips of your fingers are touching the tops of your palm. 3. Hold this position for a few seconds. 4. Extend your fingers out to their original position, all pointing straight up again.  Finger Fan Repeat this exercise 5-10 times with each hand. 1. Hold your arm and hand out in front of you. Keep your wrist straight. 2. Squeeze your hand into a fist. 3. Hold this position for a few seconds. 4. Fan out, or spread apart, your hand and fingers as much as possible, stretching every joint fully.  Tabletop Repeat this exercise 5-10 times with each hand. 1. Stand or sit with your arm, hand, and all five fingers pointed straight up. Make sure your wrist is straight. 2. Gently and slowly bend your fingers at the knuckles where they meet the hand until your hand is making an  upside-down L shape. Your fingers should form a tabletop. 3. Hold this position for a few seconds. 4. Extend your fingers out to their original position, all pointing straight up again.  Making Os Repeat this exercise 5-10 times with each hand. 1. Stand or sit with your arm, hand, and all five fingers pointed straight up. Make sure your wrist is straight. 2. Make an O shape by touching your pointer finger to your thumb. Hold for a few seconds. Then open your hand wide. 3. Repeat this motion with each finger on your hand.  Table Spread Repeat this exercise 5-10 times with each hand. 1. Place your hand on a table with your palm facing down. Make sure your wrist is straight. 2. Spread your fingers out as much as possible. Hold this position for a few seconds. 3. Slide your fingers back together again. Hold for a few seconds.  Ball Grip  Repeat this exercise 10-15 times with each hand. 1. Hold a tennis ball or another soft ball in your hand. 2. While slowly increasing pressure, squeeze the ball as hard as possible. 3. Squeeze as hard as you can for 3-5 seconds. 4. Relax and repeat.  Wrist Curls Repeat this exercise 10-15 times with each hand. 1. Sit in a chair that has armrests. 2. Hold a light weight in your hand, such as a dumbbell that weighs 1-3 pounds (0.5-1.4 kg). Ask your health care provider what weight would be best for you. 3. Rest your hand just over   the end of the chair arm with your palm facing up. 4. Gently pivot your wrist up and down while holding the weight. Do not twist your wrist from side to side.  Contact a health care provider if:  Your hand pain or discomfort gets much worse when you do an exercise.  Your hand pain or discomfort does not improve within 2 hours after you exercise. If you have any of these problems, stop doing these exercises right away. Do not do them again unless your health care provider says that you can. Get help right away if:  You  develop sudden, severe hand pain. If this happens, stop doing these exercises right away. Do not do them again unless your health care provider says that you can. This information is not intended to replace advice given to you by your health care provider. Make sure you discuss any questions you have with your health care provider. Document Released: 01/26/2015 Document Revised: 07/23/2015 Document Reviewed: 08/25/2014 [employer] Interactive Patient Education  2018 [employer] Shoulder Exercises Ask your health care provider which exercises are safe for you. Do exercises exactly as told by your health care provider and adjust them as directed. It is normal to feel mild stretching, pulling, tightness, or discomfort as you do these exercises, but you should stop right away if you feel sudden pain or your pain gets worse.Do not begin these exercises until told by your health care provider. RANGE OF MOTION EXERCISES These exercises warm up your muscles and joints and improve the movement and flexibility of your shoulder. These exercises also help to relieve pain, numbness, and tingling. These exercises involve stretching your injured shoulder directly. Exercise A: Pendulum  1. Stand near a wall or a surface that you can hold onto for balance. 2. Bend at the waist and let your left / right arm hang straight down. Use your other arm to support you. Keep your back straight and do not lock your knees. 3. Relax your left / right arm and shoulder muscles, and move your hips and your trunk so your left / right arm swings freely. Your arm should swing because of the motion of your body, not because you are using your arm or shoulder muscles. 4. Keep moving your body so your arm swings in the following directions, as told by your health care provider: ? Side to side. ? Forward and backward. ? In clockwise and counterclockwise circles. 5. Continue each motion for __________ seconds, or for as long as told by  your health care provider. 6. Slowly return to the starting position. Repeat __________ times. Complete this exercise __________ times a day. Exercise B:Flexion, Standing  1. Stand and hold a broomstick, a cane, or a similar object. Place your hands a little more than shoulder-width apart on the object. Your left / right hand should be palm-up, and your other hand should be palm-down. 2. Keep your elbow straight and keep your shoulder muscles relaxed. Push the stick down with your healthy arm to raise your left / right arm in front of your body, and then over your head until you feel a stretch in your shoulder. ? Avoid shrugging your shoulder while you raise your arm. Keep your shoulder blade tucked down toward the middle of your back. 3. Hold for __________ seconds. 4. Slowly return to the starting position. Repeat __________ times. Complete this exercise __________ times a day. Exercise C: Abduction, Standing 1. Stand and hold a broomstick, a cane, or a similar   object. Place your hands a little more than shoulder-width apart on the object. Your left / right hand should be palm-up, and your other hand should be palm-down. 2. While keeping your elbow straight and your shoulder muscles relaxed, push the stick across your body toward your left / right side. Raise your left / right arm to the side of your body and then over your head until you feel a stretch in your shoulder. ? Do not raise your arm above shoulder height, unless your health care provider tells you to do that. ? Avoid shrugging your shoulder while you raise your arm. Keep your shoulder blade tucked down toward the middle of your back. 3. Hold for __________ seconds. 4. Slowly return to the starting position. Repeat __________ times. Complete this exercise __________ times a day. Exercise D:Internal Rotation  1. Place your left / right hand behind your back, palm-up. 2. Use your other hand to dangle an exercise band, a towel, or a  similar object over your shoulder. Grasp the band with your left / right hand so you are holding onto both ends. 3. Gently pull up on the band until you feel a stretch in the front of your left / right shoulder. ? Avoid shrugging your shoulder while you raise your arm. Keep your shoulder blade tucked down toward the middle of your back. 4. Hold for __________ seconds. 5. Release the stretch by letting go of the band and lowering your hands. Repeat __________ times. Complete this exercise __________ times a day. STRETCHING EXERCISES These exercises warm up your muscles and joints and improve the movement and flexibility of your shoulder. These exercises also help to relieve pain, numbness, and tingling. These exercises are done using your healthy shoulder to help stretch the muscles of your injured shoulder. Exercise E: Corner Stretch (External Rotation and Abduction)  1. Stand in a doorway with one of your feet slightly in front of the other. This is called a staggered stance. If you cannot reach your forearms to the door frame, stand facing a corner of a room. 2. Choose one of the following positions as told by your health care provider: ? Place your hands and forearms on the door frame above your head. ? Place your hands and forearms on the door frame at the height of your head. ? Place your hands on the door frame at the height of your elbows. 3. Slowly move your weight onto your front foot until you feel a stretch across your chest and in the front of your shoulders. Keep your head and chest upright and keep your abdominal muscles tight. 4. Hold for __________ seconds. 5. To release the stretch, shift your weight to your back foot. Repeat __________ times. Complete this stretch __________ times a day. Exercise F:Extension, Standing 1. Stand and hold a broomstick, a cane, or a similar object behind your back. ? Your hands should be a little wider than shoulder-width apart. ? Your palms  should face away from your back. 2. Keeping your elbows straight and keeping your shoulder muscles relaxed, move the stick away from your body until you feel a stretch in your shoulder. ? Avoid shrugging your shoulders while you move the stick. Keep your shoulder blade tucked down toward the middle of your back. 3. Hold for __________ seconds. 4. Slowly return to the starting position. Repeat __________ times. Complete this exercise __________ times a day. STRENGTHENING EXERCISES These exercises build strength and endurance in your shoulder. Endurance is the ability   to use your muscles for a long time, even after they get tired. Exercise G:External Rotation  1. Sit in a stable chair without armrests. 2. Secure an exercise band at elbow height on your left / right side. 3. Place a soft object, such as a folded towel or a small pillow, between your left / right upper arm and your body to move your elbow a few inches away (about 10 cm) from your side. 4. Hold the end of the band so it is tight and there is no slack. 5. Keeping your elbow pressed against the soft object, move your left / right forearm out, away from your abdomen. Keep your body steady so only your forearm moves. 6. Hold for __________ seconds. 7. Slowly return to the starting position. Repeat __________ times. Complete this exercise __________ times a day. Exercise H:Shoulder Abduction  1. Sit in a stable chair without armrests, or stand. 2. Hold a __________ weight in your left / right hand, or hold an exercise band with both hands. 3. Start with your arms straight down and your left / right palm facing in, toward your body. 4. Slowly lift your left / right hand out to your side. Do not lift your hand above shoulder height unless your health care provider tells you that this is safe. ? Keep your arms straight. ? Avoid shrugging your shoulder while you do this movement. Keep your shoulder blade tucked down toward the middle of  your back. 5. Hold for __________ seconds. 6. Slowly lower your arm, and return to the starting position. Repeat __________ times. Complete this exercise __________ times a day. Exercise I:Shoulder Extension 1. Sit in a stable chair without armrests, or stand. 2. Secure an exercise band to a stable object in front of you where it is at shoulder height. 3. Hold one end of the exercise band in each hand. Your palms should face each other. 4. Straighten your elbows and lift your hands up to shoulder height. 5. Step back, away from the secured end of the exercise band, until the band is tight and there is no slack. 6. Squeeze your shoulder blades together as you pull your hands down to the sides of your thighs. Stop when your hands are straight down by your sides. Do not let your hands go behind your body. 7. Hold for __________ seconds. 8. Slowly return to the starting position. Repeat __________ times. Complete this exercise __________ times a day. Exercise J:Standing Shoulder Row 1. Sit in a stable chair without armrests, or stand. 2. Secure an exercise band to a stable object in front of you so it is at waist height. 3. Hold one end of the exercise band in each hand. Your palms should be in a thumbs-up position. 4. Bend each of your elbows to an "L" shape (about 90 degrees) and keep your upper arms at your sides. 5. Step back until the band is tight and there is no slack. 6. Slowly pull your elbows back behind you. 7. Hold for __________ seconds. 8. Slowly return to the starting position. Repeat __________ times. Complete this exercise __________ times a day. Exercise K:Shoulder Press-Ups  1. Sit in a stable chair that has armrests. Sit upright, with your feet flat on the floor. 2. Put your hands on the armrests so your elbows are bent and your fingers are pointing forward. Your hands should be about even with the sides of your body. 3. Push down on the armrests and use your arms   to  lift yourself off of the chair. Straighten your elbows and lift yourself up as much as you comfortably can. ? Move your shoulder blades down, and avoid letting your shoulders move up toward your ears. ? Keep your feet on the ground. As you get stronger, your feet should support less of your body weight as you lift yourself up. 4. Hold for __________ seconds. 5. Slowly lower yourself back into the chair. Repeat __________ times. Complete this exercise __________ times a day. Exercise L: Wall Push-Ups  1. Stand so you are facing a stable wall. Your feet should be about one arm-length away from the wall. 2. Lean forward and place your palms on the wall at shoulder height. 3. Keep your feet flat on the floor as you bend your elbows and lean forward toward the wall. 4. Hold for __________ seconds. 5. Straighten your elbows to push yourself back to the starting position. Repeat __________ times. Complete this exercise __________ times a day. This information is not intended to replace advice given to you by your health care provider. Make sure you discuss any questions you have with your health care provider. Document Released: 12/29/2004 Document Revised: 11/09/2015 Document Reviewed: 10/26/2014 [employer] Interactive Patient Education  2018 [employer]  

## 2017-08-11 ENCOUNTER — Other Ambulatory Visit: Payer: Self-pay | Admitting: Rheumatology

## 2017-08-11 NOTE — Telephone Encounter (Signed)
Last Visit: 07/19/17 Next Visit: 01/22/18  Okay to refill per Dr. Deveshwar 

## 2017-09-14 ENCOUNTER — Other Ambulatory Visit: Payer: Self-pay | Admitting: Rheumatology

## 2017-09-14 NOTE — Telephone Encounter (Signed)
Last Visit: 07/19/17 Next Visit: 01/22/18  Okay to refill per Dr. Estanislado Pandy

## 2017-09-26 DIAGNOSIS — F331 Major depressive disorder, recurrent, moderate: Secondary | ICD-10-CM | POA: Diagnosis not present

## 2017-10-13 ENCOUNTER — Encounter (HOSPITAL_COMMUNITY): Payer: Self-pay

## 2017-10-13 ENCOUNTER — Emergency Department (HOSPITAL_COMMUNITY): Payer: PPO

## 2017-10-13 ENCOUNTER — Telehealth: Payer: Self-pay | Admitting: Rheumatology

## 2017-10-13 ENCOUNTER — Emergency Department (HOSPITAL_COMMUNITY)
Admission: EM | Admit: 2017-10-13 | Discharge: 2017-10-13 | Disposition: A | Discharge status: Home / Self Care | Payer: PPO | Attending: Emergency Medicine | Admitting: Emergency Medicine

## 2017-10-13 DIAGNOSIS — Z96619 Presence of unspecified artificial shoulder joint: Secondary | ICD-10-CM | POA: Insufficient documentation

## 2017-10-13 DIAGNOSIS — Z79899 Other long term (current) drug therapy: Secondary | ICD-10-CM | POA: Diagnosis not present

## 2017-10-13 DIAGNOSIS — Z7982 Long term (current) use of aspirin: Secondary | ICD-10-CM | POA: Diagnosis not present

## 2017-10-13 DIAGNOSIS — Z9049 Acquired absence of other specified parts of digestive tract: Secondary | ICD-10-CM | POA: Diagnosis not present

## 2017-10-13 DIAGNOSIS — I10 Essential (primary) hypertension: Secondary | ICD-10-CM | POA: Insufficient documentation

## 2017-10-13 DIAGNOSIS — S199XXA Unspecified injury of neck, initial encounter: Secondary | ICD-10-CM | POA: Diagnosis not present

## 2017-10-13 DIAGNOSIS — E119 Type 2 diabetes mellitus without complications: Secondary | ICD-10-CM | POA: Diagnosis not present

## 2017-10-13 DIAGNOSIS — M542 Cervicalgia: Secondary | ICD-10-CM | POA: Insufficient documentation

## 2017-10-13 DIAGNOSIS — F419 Anxiety disorder, unspecified: Secondary | ICD-10-CM | POA: Diagnosis not present

## 2017-10-13 DIAGNOSIS — F909 Attention-deficit hyperactivity disorder, unspecified type: Secondary | ICD-10-CM | POA: Diagnosis not present

## 2017-10-13 DIAGNOSIS — F329 Major depressive disorder, single episode, unspecified: Secondary | ICD-10-CM | POA: Diagnosis not present

## 2017-10-13 MED ORDER — LIDOCAINE 5 % EX PTCH: 1.0000 | MEDICATED_PATCH | CUTANEOUS | 0 refills | Status: DC
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
## 2017-10-13 NOTE — ED Notes (Signed)
Patient returned from CT
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
## 2017-10-13 NOTE — ED Notes (Signed)
Patient verbalized understanding of discharge instructions and denies any further needs or questions at this time. VS stable. Patient ambulatory with steady gait.  
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
## 2017-10-17 ENCOUNTER — Other Ambulatory Visit: Payer: Self-pay | Admitting: *Deleted

## 2017-10-17 MED ORDER — ALLOPURINOL 300 MG PO TABS: 450.0000 mg | ORAL_TABLET | Freq: Every day | ORAL | 5 refills | Status: DC

## 2017-10-17 NOTE — Telephone Encounter (Signed)
Refill request received via fax  Last Visit: 07/19/17 Next Visit: 01/22/18 Labs: 05/19/17 WNL  Okay to refill per Dr. Estanislado Pandy

## 2017-10-18 ENCOUNTER — Other Ambulatory Visit: Payer: Self-pay | Admitting: Rheumatology

## 2017-10-18 NOTE — Telephone Encounter (Signed)
Last Visit: 07/19/17 Next Visit: 01/22/18  Okay to refill per Dr. Estanislado Pandy

## 2017-10-19 DIAGNOSIS — R110 Nausea: Secondary | ICD-10-CM | POA: Diagnosis not present

## 2017-10-19 DIAGNOSIS — S161XXD Strain of muscle, fascia and tendon at neck level, subsequent encounter: Secondary | ICD-10-CM | POA: Diagnosis not present

## 2017-11-02 DIAGNOSIS — M542 Cervicalgia: Secondary | ICD-10-CM | POA: Diagnosis not present

## 2017-11-02 DIAGNOSIS — G44209 Tension-type headache, unspecified, not intractable: Secondary | ICD-10-CM | POA: Diagnosis not present

## 2017-11-02 DIAGNOSIS — Z23 Encounter for immunization: Secondary | ICD-10-CM | POA: Diagnosis not present

## 2017-11-02 DIAGNOSIS — Z6830 Body mass index (BMI) 30.0-30.9, adult: Secondary | ICD-10-CM | POA: Diagnosis not present

## 2017-11-03 ENCOUNTER — Other Ambulatory Visit: Payer: Self-pay | Admitting: Family Medicine

## 2017-11-03 DIAGNOSIS — Z1231 Encounter for screening mammogram for malignant neoplasm of breast: Secondary | ICD-10-CM

## 2017-11-15 DIAGNOSIS — M542 Cervicalgia: Secondary | ICD-10-CM | POA: Diagnosis not present

## 2017-11-15 DIAGNOSIS — G44209 Tension-type headache, unspecified, not intractable: Secondary | ICD-10-CM | POA: Diagnosis not present

## 2017-11-16 ENCOUNTER — Other Ambulatory Visit: Payer: Self-pay | Admitting: Rheumatology

## 2017-11-16 NOTE — Telephone Encounter (Signed)
Last Visit: 07/19/17 Next Visit: 01/22/18  Okay to refill per Dr. Estanislado Pandy

## 2017-11-17 ENCOUNTER — Other Ambulatory Visit: Payer: Self-pay | Admitting: Rheumatology

## 2017-11-17 NOTE — Telephone Encounter (Signed)
Last Visit: 07/19/17 Next Visit: 01/22/18  Okay to refill per Dr. Estanislado Pandy

## 2017-11-21 ENCOUNTER — Ambulatory Visit: Payer: PPO | Admitting: Rheumatology

## 2017-11-30 ENCOUNTER — Telehealth: Payer: Self-pay | Admitting: Rheumatology

## 2017-11-30 NOTE — Telephone Encounter (Signed)
Patient left a voicemail stating she has been having back and leg pain, as well as swelling of her feet.  Patient is not sure if she should schedule with Dr. Estanislado Pandy or if she should see an orthopedic surgeon.  Patient is requesting a return call.

## 2017-11-30 NOTE — Telephone Encounter (Signed)
Per Dr. Estanislado Pandy patient needs to see orthopedic surgeon. Patient was transferred to the front to schedule an appointment with Dr. Lorin Mercy.

## 2017-12-11 ENCOUNTER — Ambulatory Visit
Admission: RE | Admit: 2017-12-11 | Discharge: 2017-12-11 | Disposition: A | Discharge status: Home / Self Care | Payer: PPO | Source: Ambulatory Visit | Attending: Family Medicine | Admitting: Family Medicine

## 2017-12-11 DIAGNOSIS — Z1231 Encounter for screening mammogram for malignant neoplasm of breast: Secondary | ICD-10-CM

## 2017-12-19 ENCOUNTER — Ambulatory Visit (INDEPENDENT_AMBULATORY_CARE_PROVIDER_SITE_OTHER): Payer: PPO

## 2017-12-19 ENCOUNTER — Encounter (INDEPENDENT_AMBULATORY_CARE_PROVIDER_SITE_OTHER): Payer: Self-pay | Admitting: Orthopaedic Surgery

## 2017-12-19 ENCOUNTER — Ambulatory Visit (INDEPENDENT_AMBULATORY_CARE_PROVIDER_SITE_OTHER): Payer: PPO | Admitting: Orthopaedic Surgery

## 2017-12-19 VITALS — BP 131/84 | HR 92 | Ht 65.5 in | Wt 176.0 lb

## 2017-12-19 DIAGNOSIS — M545 Low back pain, unspecified: Secondary | ICD-10-CM
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
## 2017-12-21 ENCOUNTER — Other Ambulatory Visit: Payer: Self-pay | Admitting: Rheumatology

## 2017-12-21 NOTE — Telephone Encounter (Signed)
Last visit: 07/19/2017 Next visit: 01/22/2018  Last fill: 11/16/2017  Okay to refill per Dr. Estanislado Pandy.

## 2017-12-22 ENCOUNTER — Encounter (INDEPENDENT_AMBULATORY_CARE_PROVIDER_SITE_OTHER): Payer: Self-pay | Admitting: Orthopaedic Surgery

## 2017-12-28 ENCOUNTER — Ambulatory Visit: Payer: PPO | Attending: Orthopaedic Surgery | Admitting: Physical Therapy

## 2017-12-28 ENCOUNTER — Encounter: Payer: Self-pay | Admitting: Physical Therapy

## 2017-12-28 ENCOUNTER — Other Ambulatory Visit: Payer: Self-pay

## 2017-12-28 DIAGNOSIS — M62830 Muscle spasm of back: Secondary | ICD-10-CM | POA: Insufficient documentation

## 2017-12-28 DIAGNOSIS — G8929 Other chronic pain: Secondary | ICD-10-CM | POA: Insufficient documentation

## 2017-12-28 DIAGNOSIS — M545 Low back pain: Secondary | ICD-10-CM | POA: Diagnosis not present

## 2017-12-28 DIAGNOSIS — R293 Abnormal posture: Secondary | ICD-10-CM | POA: Diagnosis not present
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
## 2018-01-08 DIAGNOSIS — Z0000 Encounter for general adult medical examination without abnormal findings: Secondary | ICD-10-CM | POA: Diagnosis not present

## 2018-01-08 DIAGNOSIS — K219 Gastro-esophageal reflux disease without esophagitis: Secondary | ICD-10-CM | POA: Diagnosis not present

## 2018-01-08 DIAGNOSIS — E2839 Other primary ovarian failure: Secondary | ICD-10-CM | POA: Diagnosis not present

## 2018-01-08 DIAGNOSIS — D509 Iron deficiency anemia, unspecified: Secondary | ICD-10-CM | POA: Diagnosis not present

## 2018-01-08 DIAGNOSIS — Z6830 Body mass index (BMI) 30.0-30.9, adult: Secondary | ICD-10-CM | POA: Diagnosis not present

## 2018-01-08 DIAGNOSIS — Z1389 Encounter for screening for other disorder: Secondary | ICD-10-CM | POA: Diagnosis not present

## 2018-01-08 DIAGNOSIS — K589 Irritable bowel syndrome without diarrhea: Secondary | ICD-10-CM | POA: Diagnosis not present

## 2018-01-08 DIAGNOSIS — M8580 Other specified disorders of bone density and structure, unspecified site: Secondary | ICD-10-CM | POA: Diagnosis not present

## 2018-01-08 DIAGNOSIS — I10 Essential (primary) hypertension: Secondary | ICD-10-CM | POA: Diagnosis not present
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
## 2018-01-09 ENCOUNTER — Encounter: Payer: Self-pay | Admitting: Physical Therapy

## 2018-01-09 ENCOUNTER — Ambulatory Visit: Payer: PPO | Attending: Orthopaedic Surgery | Admitting: Physical Therapy

## 2018-01-09 DIAGNOSIS — R293 Abnormal posture: Secondary | ICD-10-CM | POA: Diagnosis not present

## 2018-01-09 DIAGNOSIS — M62830 Muscle spasm of back: Secondary | ICD-10-CM | POA: Diagnosis not present

## 2018-01-09 DIAGNOSIS — G8929 Other chronic pain: Secondary | ICD-10-CM

## 2018-01-09 DIAGNOSIS — M545 Low back pain: Secondary | ICD-10-CM | POA: Insufficient documentation
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
## 2018-01-10 ENCOUNTER — Encounter: Payer: Self-pay | Admitting: Physical Therapy

## 2018-01-10 ENCOUNTER — Ambulatory Visit: Payer: PPO | Admitting: Physical Therapy

## 2018-01-10 DIAGNOSIS — M545 Low back pain: Principal | ICD-10-CM

## 2018-01-10 DIAGNOSIS — G8929 Other chronic pain: Secondary | ICD-10-CM

## 2018-01-10 DIAGNOSIS — R293 Abnormal posture: Secondary | ICD-10-CM

## 2018-01-10 DIAGNOSIS — M62830 Muscle spasm of back: Secondary | ICD-10-CM

## 2018-01-10 NOTE — Therapy (Signed)
[place], [place], [address] Phone: [phone]   Fax:  [phone]  Physical Therapy Treatment  Patient Details  Name: Tammy Mullins MRN: [id number] Date of Birth: [birth year] Referring Provider (PT): Marybelle Killings, MD   Encounter Date: 01/10/2018  PT End of Session - 01/10/18 1253    Visit Number  3    Number of Visits  13    Date for PT Re-Evaluation  02/08/18    Authorization Type  health team advantage: KX mod by 15th visit, progress note at 10th visit.     PT Start Time  1232    PT Stop Time  1303    PT Time Calculation (min)  31 min    Activity Tolerance  Patient tolerated treatment well    Behavior During Therapy  WFL for tasks assessed/performed       Past Medical History:  Diagnosis Date  . Allergic rhinitis, cause unspecified   . Allergy   . Anxiety state, unspecified   . Arthritis   . Attention deficit disorder without mention of hyperactivity   . Barrett's esophagus   . Blood transfusion without reported diagnosis 2018-10  . Chronic pain syndrome   . 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 Depression   . Diabetes type 2, controlled ([place]) 01/09/2014  . Diverticulosis of colon (without mention of hemorrhage)   . Esophageal reflux   . Fibromyalgia   . Gout, unspecified   . Hiatal hernia   . Irritable bowel syndrome   . 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 Myalgia and myositis, unspecified   . Osteoarthrosis, unspecified whether generalized or localized, unspecified site   . Personal history of unspecified circulatory disease   . Pure hypercholesterolemia   . Radial styloid tenosynovitis   . Unspecified essential hypertension   . Unspecified nonpsychotic mental disorder   . Unspecified pruritic disorder     Past Surgical History:  Procedure Laterality Date  . ANKLE SURGERY Right   . APPENDECTOMY    . CARPAL TUNNEL RELEASE Right   . CERVICAL SPINE SURGERY      plates and pins  . CHOLECYSTECTOMY N/A 06/26/2015   Procedure: LAPAROSCOPIC CHOLECYSTECTOMY;  Surgeon: Ralene Ok, MD;  Location: [facility];  Service: General;  Laterality: N/A;  . COLONOSCOPY    . ESOPHAGOGASTRODUODENOSCOPY    . ESOPHAGOGASTRODUODENOSCOPY N/A 12/04/2016   Procedure: ESOPHAGOGASTRODUODENOSCOPY (EGD);  Surgeon: Ladene Artist, MD;  Location: [facility] ENDOSCOPY;  Service: Endoscopy;  Laterality: N/A;  . LAPAROSCOPIC APPENDECTOMY N/A 06/26/2015   Procedure: LAPAROSCOPIC APPENDECTOMY;  Surgeon: Ralene Ok, MD;  Location: [facility];  Service: General;  Laterality: N/A;  . ROTATOR CUFF REPAIR Right   . SALPINGOOPHORECTOMY  Left   ectopic 1980  . TOTAL SHOULDER ARTHROPLASTY    . UPPER GASTROINTESTINAL ENDOSCOPY      There were no vitals filed for this visit.  Subjective Assessment - 01/10/18 1249    Subjective  Pt arriving to therapy reporting nausea and vomiting. Pt reported she "threw up her lunch". Pt was given the option to reschedule her therapy session but insisted she wanted to try something. Pt reporting she felt better immediately following her last session, but later that afternnon and evening her pain increased to 10/10 with muscle spasms.     Limitations  Standing;Walking    How long can you sit comfortably?  couple of hours    How long can you stand comfortably?  60 min  How long can you walk comfortably?  60 min     Diagnostic tests  x-ray 12/19/2017    Patient Stated Goals  to get better, decrease pain, return to walking with the dogs.     Currently in Pain?  Yes    Pain Score  8     Pain Location  Back    Pain Orientation  Lower    Pain Descriptors / Indicators  Aching;Spasm    Pain Type  Chronic pain    Pain Onset  More than a month ago    Pain Frequency  Constant                       [facility] Adult PT Treatment/Exercise - 01/10/18 0001      Modalities   Modalities  Electrical Stimulation      Moist Heat Therapy   Number Minutes  Moist Heat  20 Minutes    Moist Heat Location  Lumbar Spine      Electrical Stimulation   Electrical Stimulation Location  lumbar paraspinals    Electrical Stimulation Action  IFC    Electrical Stimulation Parameters  80-150 Hz x 15 minutes, intensity to tolreance    Electrical Stimulation Goals  Pain             PT Education - 01/10/18 1252    Education Details  Instructed pt to continue her HEP, Pt able to verbally recall her lumbar stretches,  and explained rationale around E-stim treatment today.     Person(s) Educated  Patient    Methods  Explanation    Comprehension  Verbalized understanding       PT Short Term Goals - 01/10/18 1258      PT SHORT TERM GOAL #1   Title  pt to be I with inital HEP given at initial eval    Time  3    Period  Weeks    Status  On-going      PT SHORT TERM GOAL #2   Title  pt to verbalize and demo proper posture and lifting mechanics to reduce and prevent low back pain     Time  3    Period  Weeks    Status  On-going        PT Long Term Goals - 12/28/17 1737      PT LONG TERM GOAL #1   Title  pt to increase trunk flexion to >/= 70 degrees and extension and bil side bending to >/= 20 degrees with </= 2/10 pain for function mobility     Time  6    Period  Weeks    Status  New    Target Date  02/08/18      PT LONG TERM GOAL #2   Title  pt to increase bil hip strength to >/= 4+/5 in all planes to promote good posture with lifting and walking     Time  6    Period  Weeks    Status  New    Target Date  02/08/18      PT LONG TERM GOAL #3   Title  pt to be able to walk and standing >/= 60 with </= 2/10 pain for functional endurnace and pt's goal of returning to walking for exercise     Time  6    Period  Weeks    Status  New    Target Date  02/08/18      PT  LONG TERM GOAL #4   Title  increase FOTO score to </= 40% limited to demo improvement in function     Time  6    Period  Weeks    Status  New    Target Date  02/08/18       PT LONG TERM GOAL #5   Title  pt to be I with all HEP given to maintain and promote current level of function     Time  6    Period  Weeks    Status  New    Target Date  02/08/18            Plan - 01/10/18 1254    Clinical Impression Statement  Pt presenting to therapy today reporting nausea and vomiting. Pt reported not being able to hold down her lunch today.  Pt reporting she wants to try therapy, but doesn't feel like doing much. After reporting increasesd muscle spasms following her visit yesterday. Pt reviewed her lumbar stretches. Pt toleraing E-stim well with normal response to E-stim and moist heat. Continue to progess pt towrad LTG's set with skilled PT.     Rehab Potential  Good    PT Frequency  2x / week    PT Duration  6 weeks    PT Treatment/Interventions  ADLs/Self Care Home Management;Cryotherapy;Electrical Stimulation;Iontophoresis 4mg /ml Dexamethasone;Moist Heat;Ultrasound;Balance training;Patient/family education;Gait training;Stair training;Therapeutic activities;Therapeutic exercise;Manual techniques;Taping;Dry needling;Passive range of motion;Traction    PT Next Visit Plan  review/ update HEP, STW along lumbar parapsinals, core strengthening, potential SIJ involvement on the R posteriorly, hamstring stretch, hip flexor activation. modalities PRN    PT Home Exercise Plan  lower trunk rotation, hamstring stretch, hip flexor stretch, supine pelvic tilt.     Consulted and Agree with Plan of Care  Patient       Patient will benefit from skilled therapeutic intervention in order to improve the following deficits and impairments:     Visit Diagnosis: Chronic bilateral low back pain, unspecified whether sciatica present  Abnormal posture  Muscle spasm of back     Problem List Patient Active Problem List   Diagnosis Date Noted  . Primary osteoarthritis of right knee 05/19/2017  . Anemia 12/03/2016  . 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 History of  gastroesophageal reflux (GERD) 04/19/2016  . History of hypertension 04/19/2016  . Other fatigue 03/21/2016  . Primary insomnia 03/21/2016  . History of gout 03/21/2016  . Osteoarthritis of lumbar spine 03/21/2016  . Dyslipidemia 03/21/2016  . History of IBS 03/21/2016  . History of depression 03/21/2016  . History of cholelithiasis 03/21/2016  . Pain management 03/21/2016  . Diabetes type 2, controlled ([place]) 01/09/2014  . Gout 09/20/2013  . OSA (obstructive sleep apnea) 09/16/2013  . Sleep disturbance 06/27/2013  . Chest pain, unspecified 06/06/2012  . Vaginal atrophy 03/15/2012  . Osteopenia 03/15/2012  . Post-menopausal 03/15/2012  . Vitamin D deficiency 11/04/2010  . Depression 10/20/2007  . Chronic pain syndrome 10/20/2007  . Allergic rhinitis 10/20/2007  . HYPERCHOLESTEROLEMIA 10/03/2007  . History of cardiovascular disorder 10/03/2007  . Anxiety state 04/20/2007  . 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/2007  . Essential hypertension 04/20/2007  . GERD 04/20/2007  . Irritable bowel syndrome 04/20/2007  . Primary osteoarthritis of both hands 04/20/2007  . DE QUERVAIN'S TENOSYNOVITIS 04/20/2007  . Fibromyalgia 04/20/2007  . Diverticulosis of colon 01/09/2001    Oretha Caprice, PT 01/10/2018, 1:04 PM  [place],  [place], [address] Phone: [phone]   Fax:  [phone]  Name: Tammy Mullins MRN: [id number] Date of Birth: [birth year]

## 2018-01-15 ENCOUNTER — Encounter: Payer: Self-pay | Admitting: Physical Therapy

## 2018-01-15 ENCOUNTER — Ambulatory Visit: Payer: PPO | Admitting: Physical Therapy

## 2018-01-15 DIAGNOSIS — M545 Low back pain: Principal | ICD-10-CM

## 2018-01-15 DIAGNOSIS — M62830 Muscle spasm of back: Secondary | ICD-10-CM

## 2018-01-15 DIAGNOSIS — R293 Abnormal posture: Secondary | ICD-10-CM

## 2018-01-15 DIAGNOSIS — G8929 Other chronic pain: Secondary | ICD-10-CM

## 2018-01-15 NOTE — Therapy (Addendum)
[place], [place], [address] Phone: [phone]   Fax:  [phone]  Physical Therapy Treatment / Discharge Summary Patient Details  Name: Spring San MRN: [id number] Date of Birth: Apr 28, 1950 Referring Provider (PT): Marybelle Killings, MD   Encounter Date: 01/15/2018  PT End of Session - 01/15/18 1535    Visit Number  4    Number of Visits  13    Date for PT Re-Evaluation  02/08/18    Authorization Type  health team advantage: KX mod by 15th visit, progress note at 10th visit.     PT Start Time  1500    PT Stop Time  1546    PT Time Calculation (min)  46 min    Activity Tolerance  Patient tolerated treatment well    Behavior During Therapy  WFL for tasks assessed/performed       Past Medical History:  Diagnosis Date  . Allergic rhinitis, cause unspecified   . Allergy   . Anxiety state, unspecified   . Arthritis   . Attention deficit disorder without mention of hyperactivity   . Barrett's esophagus   . Blood transfusion without reported diagnosis 2018-10  . Chronic pain syndrome   . 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 Depression   . Diabetes type 2, controlled ([place]) 01/09/2014  . Diverticulosis of colon (without mention of hemorrhage)   . Esophageal reflux   . Fibromyalgia   . Gout, unspecified   . Hiatal hernia   . Irritable bowel syndrome   . 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 Myalgia and myositis, unspecified   . Osteoarthrosis, unspecified whether generalized or localized, unspecified site   . Personal history of unspecified circulatory disease   . Pure hypercholesterolemia   . Radial styloid tenosynovitis   . Unspecified essential hypertension   . Unspecified nonpsychotic mental disorder   . Unspecified pruritic disorder     Past Surgical History:  Procedure Laterality Date  . ANKLE SURGERY Right   . APPENDECTOMY    . CARPAL TUNNEL RELEASE Right   .  CERVICAL SPINE SURGERY     plates and pins  . CHOLECYSTECTOMY N/A 06/26/2015   Procedure: LAPAROSCOPIC CHOLECYSTECTOMY;  Surgeon: Ralene Ok, MD;  Location: [facility];  Service: General;  Laterality: N/A;  . COLONOSCOPY    . ESOPHAGOGASTRODUODENOSCOPY    . ESOPHAGOGASTRODUODENOSCOPY N/A 12/04/2016   Procedure: ESOPHAGOGASTRODUODENOSCOPY (EGD);  Surgeon: Ladene Artist, MD;  Location: [facility] ENDOSCOPY;  Service: Endoscopy;  Laterality: N/A;  . LAPAROSCOPIC APPENDECTOMY N/A 06/26/2015   Procedure: LAPAROSCOPIC APPENDECTOMY;  Surgeon: Ralene Ok, MD;  Location: [facility];  Service: General;  Laterality: N/A;  . ROTATOR CUFF REPAIR Right   . SALPINGOOPHORECTOMY  Left   ectopic 1980  . TOTAL SHOULDER ARTHROPLASTY    . UPPER GASTROINTESTINAL ENDOSCOPY      There were no vitals filed for this visit.  Subjective Assessment - 01/15/18 1502    Subjective  "I had alot of soreness which i've had more referred soreness into my upper and neck"     Currently in Pain?  Yes    Pain Score  7     Pain Location  Back    Pain Orientation  Lower    Pain Descriptors / Indicators  Aching;Sore    Pain Type  Chronic pain    Pain Onset  More than a month ago    Pain Frequency  Constant    Aggravating  Factors   unable to report    Pain Relieving Factors  medication, heat and covering up with alot of covers                       [facility] Adult PT Treatment/Exercise - 01/15/18 0001      Exercises   Exercises  Lumbar      Lumbar Exercises: Stretches   Active Hamstring Stretch  3 reps;30 seconds;Right   contract/ relax with 10 sec contraction   Lower Trunk Rotation Limitations  2 x 10 holding end range x 2 seconds      Lumbar Exercises: Aerobic   Nustep  L5 x 5 min UE/LE      Lumbar Exercises: Supine   Pelvic Tilt  10 reps   5 seconds   Dead Bug  5 reps   10 sec hold   Dead Bug Limitations  verbal cues to keep legs in proper position and knee      Moist Heat Therapy   Number Minutes  Moist Heat  10 Minutes    Moist Heat Location  Lumbar Spine   in supine     Electrical Stimulation   Electrical Stimulation Location  lumbar paraspinals    Electrical Stimulation Action  IFC    Electrical Stimulation Parameters  100% scan, intensity at L18, x 10 min     Electrical Stimulation Goals  Pain      Manual Therapy   Manual Therapy  Joint mobilization;Muscle Energy Technique    Joint Mobilization  LAD distraction grade 5 on RLE only    Muscle Energy Technique  resisted R hip flexion 10 x holding 8-10 seconds             PT Education - 01/15/18 1538    Education Details  reviewed previously provided HEp and importance of doing some exercise versus staying in bed when she has pain to prevent pain from getting worse    Person(s) Educated  Patient    Methods  Explanation;Verbal cues    Comprehension  Verbalized understanding;Verbal cues required       PT Short Term Goals - 01/10/18 1258      PT SHORT TERM GOAL #1   Title  pt to be I with inital HEP given at initial eval    Time  3    Period  Weeks    Status  On-going      PT SHORT TERM GOAL #2   Title  pt to verbalize and demo proper posture and lifting mechanics to reduce and prevent low back pain     Time  3    Period  Weeks    Status  On-going        PT Long Term Goals - 12/28/17 1737      PT LONG TERM GOAL #1   Title  pt to increase trunk flexion to >/= 70 degrees and extension and bil side bending to >/= 20 degrees with </= 2/10 pain for function mobility     Time  6    Period  Weeks    Status  New    Target Date  02/08/18      PT LONG TERM GOAL #2   Title  pt to increase bil hip strength to >/= 4+/5 in all planes to promote good posture with lifting and walking     Time  6    Period  Weeks    Status  New  Target Date  02/08/18      PT LONG TERM GOAL #3   Title  pt to be able to walk and standing >/= 60 with </= 2/10 pain for functional endurnace and pt's goal of returning to walking for  exercise     Time  6    Period  Weeks    Status  New    Target Date  02/08/18      PT LONG TERM GOAL #4   Title  increase FOTO score to </= 40% limited to demo improvement in function     Time  6    Period  Weeks    Status  New    Target Date  02/08/18      PT LONG TERM GOAL #5   Title  pt to be I with all HEP given to maintain and promote current level of function     Time  6    Period  Weeks    Status  New    Target Date  02/08/18            Plan - 01/15/18 1536    Clinical Impression Statement  pt reproted 7/10 pain starting therapy today. focused on R posteriorly rotated innominate with hamstring stretching and hip flexor MET. added core strenghtening which she performed well but fatigued quickly. used MHP and E-stim end of session to calm down soreness which she reported pain dropped to 4/10.    PT Next Visit Plan  review/ update HEP, STW along lumbar parapsinals, core strengthening, potential SIJ involvement on the R posteriorly, hamstring stretch, hip flexor activation. modalities PRN    PT Home Exercise Plan  lower trunk rotation, hamstring stretch, hip flexor stretch, supine pelvic tilt.     Consulted and Agree with Plan of Care  Patient       Patient will benefit from skilled therapeutic intervention in order to improve the following deficits and impairments:  Pain, Abnormal gait, Increased fascial restricitons, Increased muscle spasms, Decreased endurance, Postural dysfunction, Improper body mechanics, Decreased range of motion, Decreased activity tolerance, Decreased balance  Visit Diagnosis: Chronic bilateral low back pain, unspecified whether sciatica present  Abnormal posture  Muscle spasm of back     Problem List Patient Active Problem List   Diagnosis Date Noted  . Primary osteoarthritis of right knee 05/19/2017  . Anemia 12/03/2016  . 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 History of gastroesophageal reflux (GERD) 04/19/2016  . History of  hypertension 04/19/2016  . Other fatigue 03/21/2016  . Primary insomnia 03/21/2016  . History of gout 03/21/2016  . Osteoarthritis of lumbar spine 03/21/2016  . Dyslipidemia 03/21/2016  . History of IBS 03/21/2016  . History of depression 03/21/2016  . History of cholelithiasis 03/21/2016  . Pain management 03/21/2016  . Diabetes type 2, controlled ([place]) 01/09/2014  . Gout 09/20/2013  . OSA (obstructive sleep apnea) 09/16/2013  . Sleep disturbance 06/27/2013  . Chest pain, unspecified 06/06/2012  . Vaginal atrophy 03/15/2012  . Osteopenia 03/15/2012  . Post-menopausal 03/15/2012  . Vitamin D deficiency 11/04/2010  . Depression 10/20/2007  . Chronic pain syndrome 10/20/2007  . Allergic rhinitis 10/20/2007  . HYPERCHOLESTEROLEMIA 10/03/2007  . History of cardiovascular disorder 10/03/2007  . Anxiety state 04/20/2007  . 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/2007  . Essential hypertension 04/20/2007  . GERD 04/20/2007  . Irritable bowel syndrome 04/20/2007  . Primary osteoarthritis of both hands 04/20/2007  . DE QUERVAIN'S TENOSYNOVITIS 04/20/2007  . Fibromyalgia 04/20/2007  .  Diverticulosis of colon 01/09/2001   Starr Lake PT, DPT, LAT, ATC  01/15/18  3:41 PM      [facility] [address] [place], [place], [address] Phone: [phone]   Fax:  [phone]  Name: Sharetta Ricchio MRN: [id number] Date of Birth: November 14, 1950      PHYSICAL THERAPY DISCHARGE SUMMARY  Visits from Start of Care: 4  Current functional level related to goals / functional outcomes: See goals   Remaining deficits: unknown   Education / Equipment: HEP, theraband  Plan: Patient agrees to discharge.  Patient goals were not met. Patient is being discharged due to not returning since the last visit.  ?????         Wilene Pharo PT, DPT, LAT, ATC  02/06/18  11:23 AM

## 2018-01-16 ENCOUNTER — Ambulatory Visit: Payer: PPO | Admitting: Psychiatry

## 2018-01-16 DIAGNOSIS — F32A Depression, unspecified: Secondary | ICD-10-CM

## 2018-01-16 DIAGNOSIS — F329 Major depressive disorder, single episode, unspecified: Secondary | ICD-10-CM

## 2018-01-16 DIAGNOSIS — Z8659 Personal history of other mental and behavioral disorders: Secondary | ICD-10-CM

## 2018-01-16 MED ORDER — DULOXETINE HCL 60 MG PO CPEP: 60.0000 mg | ORAL_CAPSULE | Freq: Every day | ORAL | 2 refills | Status: DC

## 2018-01-16 MED ORDER — QUETIAPINE FUMARATE 300 MG PO TABS: 300.0000 mg | ORAL_TABLET | Freq: Every day | ORAL | 3 refills | Status: DC
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
## 2018-01-17 ENCOUNTER — Ambulatory Visit: Payer: PPO

## 2018-01-19 ENCOUNTER — Ambulatory Visit (INDEPENDENT_AMBULATORY_CARE_PROVIDER_SITE_OTHER): Payer: PPO | Admitting: Orthopaedic Surgery

## 2018-01-19 ENCOUNTER — Encounter (INDEPENDENT_AMBULATORY_CARE_PROVIDER_SITE_OTHER): Payer: Self-pay | Admitting: Orthopaedic Surgery

## 2018-01-19 VITALS — BP 139/80 | HR 103 | Ht 65.5 in | Wt 176.0 lb

## 2018-01-19 DIAGNOSIS — M5136 Other intervertebral disc degeneration, lumbar region: Secondary | ICD-10-CM | POA: Diagnosis not present

## 2018-01-19 DIAGNOSIS — M5416 Radiculopathy, lumbar region: Secondary | ICD-10-CM
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
## 2018-01-21 ENCOUNTER — Other Ambulatory Visit: Payer: Self-pay | Admitting: Rheumatology

## 2018-01-22 ENCOUNTER — Encounter: Payer: Self-pay | Admitting: Physician Assistant

## 2018-01-22 ENCOUNTER — Ambulatory Visit: Payer: PPO | Admitting: Physician Assistant

## 2018-01-22 VITALS — BP 152/93 | HR 99 | Resp 13 | Ht 65.5 in | Wt 184.8 lb

## 2018-01-22 DIAGNOSIS — M8589 Other specified disorders of bone density and structure, multiple sites: Secondary | ICD-10-CM | POA: Diagnosis not present

## 2018-01-22 DIAGNOSIS — G894 Chronic pain syndrome: Secondary | ICD-10-CM

## 2018-01-22 DIAGNOSIS — Z8679 Personal history of other diseases of the circulatory system: Secondary | ICD-10-CM | POA: Diagnosis not present

## 2018-01-22 DIAGNOSIS — M19042 Primary osteoarthritis, left hand: Secondary | ICD-10-CM

## 2018-01-22 DIAGNOSIS — M47816 Spondylosis without myelopathy or radiculopathy, lumbar region: Secondary | ICD-10-CM

## 2018-01-22 DIAGNOSIS — M797 Fibromyalgia: Secondary | ICD-10-CM

## 2018-01-22 DIAGNOSIS — M1A09X0 Idiopathic chronic gout, multiple sites, without tophus (tophi): Secondary | ICD-10-CM

## 2018-01-22 DIAGNOSIS — F32A Depression, unspecified: Secondary | ICD-10-CM

## 2018-01-22 DIAGNOSIS — Z5181 Encounter for therapeutic drug level monitoring: Secondary | ICD-10-CM

## 2018-01-22 DIAGNOSIS — F5101 Primary insomnia: Secondary | ICD-10-CM

## 2018-01-22 DIAGNOSIS — Z8719 Personal history of other diseases of the digestive system: Secondary | ICD-10-CM

## 2018-01-22 DIAGNOSIS — M19041 Primary osteoarthritis, right hand: Secondary | ICD-10-CM

## 2018-01-22 DIAGNOSIS — R5383 Other fatigue: Secondary | ICD-10-CM

## 2018-01-22 DIAGNOSIS — F419 Anxiety disorder, unspecified: Secondary | ICD-10-CM

## 2018-01-22 DIAGNOSIS — F329 Major depressive disorder, single episode, unspecified: Secondary | ICD-10-CM

## 2018-01-22 NOTE — Telephone Encounter (Signed)
Last visit: 07/19/2017 Next visit: 01/22/2018  Okay to refill per Dr. Estanislado Pandy.

## 2018-01-23 ENCOUNTER — Ambulatory Visit: Payer: PPO | Admitting: Physical Therapy

## 2018-01-23 ENCOUNTER — Telehealth: Payer: Self-pay | Admitting: Psychiatry

## 2018-01-23 ENCOUNTER — Other Ambulatory Visit: Payer: Self-pay

## 2018-01-23 LAB — COMPLETE METABOLIC PANEL WITH GFR
AG Ratio: 1.4 (calc) (ref 1.0–2.5)
ALBUMIN MSPROF: 4 g/dL (ref 3.6–5.1)
ALKALINE PHOSPHATASE (APISO): 67 U/L (ref 33–130)
ALT: 24 U/L (ref 6–29)
AST: 28 U/L (ref 10–35)
BUN: 9 mg/dL (ref 7–25)
CHLORIDE: 100 mmol/L (ref 98–110)
CO2: 36 mmol/L — AB (ref 20–32)
CREATININE: 0.73 mg/dL (ref 0.50–0.99)
Calcium: 9.8 mg/dL (ref 8.6–10.4)
GFR, Est African American: 97 mL/min/{1.73_m2} (ref >=60)
GFR, Est Non African American: 84 mL/min/{1.73_m2} (ref >=60)
Globulin: 2.8 g/dL (calc) (ref 1.9–3.7)
Glucose, Bld: 132 mg/dL — ABNORMAL HIGH (ref 65–99)
POTASSIUM: 3.5 mmol/L (ref 3.5–5.3)
SODIUM: 141 mmol/L (ref 135–146)
TOTAL PROTEIN: 6.8 g/dL (ref 6.1–8.1)
Total Bilirubin: 0.3 mg/dL (ref 0.2–1.2)

## 2018-01-23 LAB — CBC WITH DIFFERENTIAL/PLATELET
BASOS PCT: 0.6 %
Basophils Absolute: 29 cells/uL (ref 0–200)
Eosinophils Absolute: 29 cells/uL (ref 15–500)
Eosinophils Relative: 0.6 %
HCT: 36.4 % (ref 35.0–45.0)
Hemoglobin: 12.1 g/dL (ref 11.7–15.5)
Lymphs Abs: 1112 cells/uL (ref 850–3900)
MCH: 30.3 pg (ref 27.0–33.0)
MCHC: 33.2 g/dL (ref 32.0–36.0)
MCV: 91.2 fL (ref 80.0–100.0)
MONOS PCT: 10.5 %
MPV: 9.7 fL (ref 7.5–12.5)
Neutro Abs: 3214 cells/uL (ref 1500–7800)
Neutrophils Relative %: 65.6 %
PLATELETS: 291 10*3/uL (ref 140–400)
RBC: 3.99 10*6/uL (ref 3.80–5.10)
RDW: 13.3 % (ref 11.0–15.0)
TOTAL LYMPHOCYTE: 22.7 %
WBC: 4.9 10*3/uL (ref 3.8–10.8)
WBCMIX: 515 {cells}/uL (ref 200–950)

## 2018-01-23 LAB — URIC ACID: URIC ACID, SERUM: 5.5 mg/dL (ref 2.5–7.0)

## 2018-01-23 MED ORDER — QUETIAPINE FUMARATE 300 MG PO TABS: 300.0000 mg | ORAL_TABLET | Freq: Every day | ORAL | 1 refills | Status: DC

## 2018-01-23 NOTE — Progress Notes (Signed)
Glucose is 132. Uric acid is within desirable range-5.5. Rest of labs are WNL.

## 2018-01-23 NOTE — Telephone Encounter (Signed)
Tammy Mullins called to report that the pharmacy wasn't filling her seroquel.  They said it was too early but it isn't.  The insurance company told her to have you send in a new rx to the pharmacy.  Please send in new rx to [facility] on [address].  She is out of the medicine.  Has f/u appt 04/18/18

## 2018-01-23 NOTE — Telephone Encounter (Signed)
Script for Seroquel 300 mg 1 tab q HS #90 sent to Pharmacy.

## 2018-01-24 ENCOUNTER — Ambulatory Visit
Admission: RE | Admit: 2018-01-24 | Discharge: 2018-01-24 | Disposition: A | Discharge status: Home / Self Care | Payer: PPO | Source: Ambulatory Visit | Attending: Surgery | Admitting: Surgery

## 2018-01-24 DIAGNOSIS — M5136 Other intervertebral disc degeneration, lumbar region: Secondary | ICD-10-CM

## 2018-01-24 DIAGNOSIS — M48061 Spinal stenosis, lumbar region without neurogenic claudication: Secondary | ICD-10-CM | POA: Diagnosis not present

## 2018-01-29 ENCOUNTER — Encounter: Payer: PPO | Admitting: Physical Therapy

## 2018-01-31 ENCOUNTER — Ambulatory Visit: Payer: PPO | Admitting: Physical Therapy

## 2018-02-05 ENCOUNTER — Encounter: Payer: PPO | Admitting: Physical Therapy

## 2018-02-07 ENCOUNTER — Encounter: Payer: PPO | Admitting: Physical Therapy

## 2018-02-09 ENCOUNTER — Encounter (INDEPENDENT_AMBULATORY_CARE_PROVIDER_SITE_OTHER): Payer: Self-pay | Admitting: Orthopaedic Surgery

## 2018-02-09 ENCOUNTER — Ambulatory Visit (INDEPENDENT_AMBULATORY_CARE_PROVIDER_SITE_OTHER): Payer: PPO | Admitting: Orthopaedic Surgery

## 2018-02-09 VITALS — BP 123/72 | HR 78 | Ht 65.5 in | Wt 176.0 lb

## 2018-02-09 DIAGNOSIS — M545 Low back pain, unspecified: Secondary | ICD-10-CM
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
## 2018-02-14 ENCOUNTER — Encounter (INDEPENDENT_AMBULATORY_CARE_PROVIDER_SITE_OTHER): Payer: Self-pay | Admitting: Surgery

## 2018-02-14 ENCOUNTER — Ambulatory Visit (INDEPENDENT_AMBULATORY_CARE_PROVIDER_SITE_OTHER): Payer: PPO | Admitting: Surgery

## 2018-02-14 VITALS — BP 126/81 | HR 85 | Temp 97.2°F | Ht 65.5 in | Wt 176.0 lb

## 2018-02-14 DIAGNOSIS — G8929 Other chronic pain: Secondary | ICD-10-CM | POA: Diagnosis not present

## 2018-02-14 DIAGNOSIS — M545 Low back pain, unspecified: Secondary | ICD-10-CM

## 2018-02-14 NOTE — Progress Notes (Signed)
Office Visit Note   Patient: Tammy Mullins           Date of Birth: [birth year]           MRN: [id number] Visit Date: 02/14/2018              Requested by: Maury Dus, MD [address] [place], [place] [address] PCP: Maury Dus, MD   Assessment & Plan: Visit Diagnoses:  1. Chronic low back pain, unspecified back pain laterality, unspecified whether sciatica present     Plan: We will schedule patient for consult with Dr. Ernestina Patches to discuss possible.  Redness and also tenderness over left SI joint.. Follow with Dr. Lorin Mercy in a few weeks Ernestina Patches.  Follow-Up Instructions: Return in about 4 weeks (around 03/14/2018) for [place].   Orders:  Orders Placed This Encounter  Procedures  . Ambulatory referral to Physical Medicine Rehab   No orders of the defined types were placed in this encounter.     Procedures: No procedures performed   Clinical Data: No additional findings.   Subjective: Chief Complaint  Patient presents with  . Lower Back - Pain, Follow-up  . Left Leg - Pain  . Right Leg - Pain    HPI Patient returns for recheck of her low back pain. Dr. Lorin Mercy reviewed lumbar MRI last office visit.  Patient states that he is advised her that she did not have a surgical problem at this time.  Continues to describe having ongoing low back pain and spasms.  States that her legs are also easily fatigued. Review of Systems No current cardiac pulmonary GI GU issues  Objective: Vital Signs: Ht 5' 5.5" (1.664 m)   Wt 176 lb (79.8 kg)   BMI 28.84 kg/m   Physical Exam Constitutional:      Appearance: Normal appearance.  HENT:     Head: Normocephalic and atraumatic.     Nose: Nose normal.  Musculoskeletal:     Comments: Bilateral lumbar paraspinal tenderness.  Left SI joint tenderness.  Mild tenderness over the bilateral hip greater trochanter bursa.  Skin:    General: Skin is warm and dry.     Ortho  Exam  Specialty Comments:  No specialty comments available.  Imaging: No results found.   PMFS History: Patient Active Problem List   Diagnosis Date Noted  . Primary osteoarthritis of right knee 05/19/2017  . Anemia 12/03/2016  . 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 History of gastroesophageal reflux (GERD) 04/19/2016  . History of hypertension 04/19/2016  . Other fatigue 03/21/2016  . Primary insomnia 03/21/2016  . History of gout 03/21/2016  . Osteoarthritis of lumbar spine 03/21/2016  . Dyslipidemia 03/21/2016  . History of IBS 03/21/2016  . History of depression 03/21/2016  . History of cholelithiasis 03/21/2016  . Pain management 03/21/2016  . Diabetes type 2, controlled ([place]) 01/09/2014  . Gout 09/20/2013  . OSA (obstructive sleep apnea) 09/16/2013  . Sleep disturbance 06/27/2013  . Chest pain, unspecified 06/06/2012  . Vaginal atrophy 03/15/2012  . Osteopenia 03/15/2012  . Post-menopausal 03/15/2012  . Vitamin D deficiency 11/04/2010  . Depression 10/20/2007  . Chronic pain syndrome 10/20/2007  . Allergic rhinitis 10/20/2007  . HYPERCHOLESTEROLEMIA 10/03/2007  . History of cardiovascular disorder 10/03/2007  . Anxiety state 04/20/2007  . 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/2007  . Essential hypertension 04/20/2007  . GERD 04/20/2007  . Irritable bowel syndrome 04/20/2007  . Primary osteoarthritis of both hands  04/20/2007  . DE QUERVAIN'S TENOSYNOVITIS 04/20/2007  . Fibromyalgia 04/20/2007  . Diverticulosis of colon 01/09/2001   Past Medical History:  Diagnosis Date  . Allergic rhinitis, cause unspecified   . Allergy   . Anxiety state, unspecified   . Arthritis   . Attention deficit disorder without mention of hyperactivity   . Barrett's esophagus   . Blood transfusion without reported diagnosis 2018-10  . Chronic pain syndrome   . 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 Depression   . Diabetes type 2, controlled ([place]) 01/09/2014  . Diverticulosis of  colon (without mention of hemorrhage)   . Esophageal reflux   . Fibromyalgia   . Gout, unspecified   . Hiatal hernia   . Irritable bowel syndrome   . 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 Myalgia and myositis, unspecified   . Osteoarthrosis, unspecified whether generalized or localized, unspecified site   . Personal history of unspecified circulatory disease   . Pure hypercholesterolemia   . Radial styloid tenosynovitis   . Unspecified essential hypertension   . Unspecified nonpsychotic mental disorder   . Unspecified pruritic disorder     Family History  Problem Relation Age of Onset  . Cirrhosis Father   . Cancer Maternal Grandmother        ?  . Lung disease Mother        ?   . Glaucoma Mother   . Glaucoma Brother   . Prostate cancer Brother   . Heart disease Sister   . Kidney disease Neg Hx   . Diabetes Neg Hx   . Colon cancer Neg Hx   . Colon polyps Neg Hx   . Rectal cancer Neg Hx   . Stomach cancer Neg Hx     Past Surgical History:  Procedure Laterality Date  . ANKLE SURGERY Right   . APPENDECTOMY    . CARPAL TUNNEL RELEASE Right   . CERVICAL SPINE SURGERY     plates and pins  . CHOLECYSTECTOMY N/A 06/26/2015   Procedure: LAPAROSCOPIC CHOLECYSTECTOMY;  Surgeon: Ralene Ok, MD;  Location: [place];  Service: General;  Laterality: N/A;  . COLONOSCOPY    . COLONOSCOPY  2019  . ESOPHAGOGASTRODUODENOSCOPY    . ESOPHAGOGASTRODUODENOSCOPY N/A 12/04/2016   Procedure: ESOPHAGOGASTRODUODENOSCOPY (EGD);  Surgeon: Ladene Artist, MD;  Location: [facility] ENDOSCOPY;  Service: Endoscopy;  Laterality: N/A;  . LAPAROSCOPIC APPENDECTOMY N/A 06/26/2015   Procedure: LAPAROSCOPIC APPENDECTOMY;  Surgeon: Ralene Ok, MD;  Location: [place];  Service: General;  Laterality: N/A;  . ROTATOR CUFF REPAIR Right   . SALPINGOOPHORECTOMY  Left   ectopic 1980  . TOTAL SHOULDER ARTHROPLASTY    . UPPER GASTROINTESTINAL ENDOSCOPY     Social History   Occupational History  .  Occupation: retired  Tobacco Use  . Smoking status: Never Smoker  . Smokeless tobacco: Never Used  Substance and Sexual Activity  . Alcohol use: No  . Drug use: No  . Sexual activity: Never

## 2018-02-18 ENCOUNTER — Other Ambulatory Visit: Payer: Self-pay | Admitting: Rheumatology

## 2018-02-19 NOTE — Telephone Encounter (Signed)
Last Visit: 01/22/18 Next Visit: 07/24/18  Okay to refill per Dr. Estanislado Pandy

## 2018-03-08 ENCOUNTER — Ambulatory Visit (INDEPENDENT_AMBULATORY_CARE_PROVIDER_SITE_OTHER): Payer: PPO | Admitting: Physical Medicine and Rehabilitation

## 2018-03-08 ENCOUNTER — Encounter (INDEPENDENT_AMBULATORY_CARE_PROVIDER_SITE_OTHER): Payer: Self-pay | Admitting: Physical Medicine and Rehabilitation

## 2018-03-08 VITALS — BP 166/95 | HR 108 | Ht 65.5 in | Wt 187.0 lb

## 2018-03-08 DIAGNOSIS — M47816 Spondylosis without myelopathy or radiculopathy, lumbar region: Secondary | ICD-10-CM

## 2018-03-08 DIAGNOSIS — M5441 Lumbago with sciatica, right side: Principal | ICD-10-CM

## 2018-03-08 DIAGNOSIS — M5442 Lumbago with sciatica, left side: Secondary | ICD-10-CM | POA: Diagnosis not present

## 2018-03-08 DIAGNOSIS — G894 Chronic pain syndrome: Secondary | ICD-10-CM | POA: Diagnosis not present

## 2018-03-08 DIAGNOSIS — M797 Fibromyalgia: Secondary | ICD-10-CM | POA: Diagnosis not present

## 2018-03-08 DIAGNOSIS — G8929 Other chronic pain: Secondary | ICD-10-CM

## 2018-03-08 NOTE — Progress Notes (Signed)
 .  Numeric Pain Rating Scale and Functional Assessment Average Pain 9 Pain Right Now 6 My pain is constant, sharp and aching Pain is worse with: walking, bending and some activites Pain improves with: heat/ice   In the last MONTH (on 0-10 scale) has pain interfered with the following?  1. General activity like being  able to carry out your everyday physical activities such as walking, climbing stairs, carrying groceries, or moving a chair?  Rating(6)  2. Relation with others like being able to carry out your usual social activities and roles such as  activities at home, at work and in your community. Rating(6)  3. Enjoyment of life such that you have  been bothered by emotional problems such as feeling anxious, depressed or irritable?  Rating(5)

## 2018-03-12 ENCOUNTER — Encounter: Payer: Self-pay | Admitting: Emergency Medicine

## 2018-03-12 DIAGNOSIS — F3181 Bipolar II disorder: Secondary | ICD-10-CM | POA: Insufficient documentation

## 2018-03-14 ENCOUNTER — Encounter (INDEPENDENT_AMBULATORY_CARE_PROVIDER_SITE_OTHER): Payer: Self-pay | Admitting: Physical Medicine and Rehabilitation

## 2018-03-14 ENCOUNTER — Ambulatory Visit (INDEPENDENT_AMBULATORY_CARE_PROVIDER_SITE_OTHER): Payer: Self-pay

## 2018-03-14 ENCOUNTER — Ambulatory Visit (INDEPENDENT_AMBULATORY_CARE_PROVIDER_SITE_OTHER): Payer: PPO | Admitting: Physical Medicine and Rehabilitation

## 2018-03-14 VITALS — BP 135/80 | HR 63

## 2018-03-14 DIAGNOSIS — M47816 Spondylosis without myelopathy or radiculopathy, lumbar region: Secondary | ICD-10-CM

## 2018-03-14 DIAGNOSIS — M545 Low back pain, unspecified: Secondary | ICD-10-CM

## 2018-03-14 DIAGNOSIS — G8929 Other chronic pain: Secondary | ICD-10-CM

## 2018-03-14 MED ORDER — METHYLPREDNISOLONE ACETATE 80 MG/ML IJ SUSP: 80.0000 mg | Freq: Once | INTRAMUSCULAR | Status: DC

## 2018-03-14 NOTE — Patient Instructions (Signed)

## 2018-03-14 NOTE — Progress Notes (Signed)
.  Numeric Pain Rating Scale and Functional Assessment Average Pain 6   In the last MONTH (on 0-10 scale) has pain interfered with the following?  1. General activity like being  able to carry out your everyday physical activities such as walking, climbing stairs, carrying groceries, or moving a chair?  Rating(5)   +Driver, -BT, -Dye Allergies.  

## 2018-03-15 NOTE — Procedures (Signed)
Lumbar Facet Joint Intra-Articular Injection(s) with Fluoroscopic Guidance  Patient: Tammy Mullins      Date of Birth: Sep 10, 1950 MRN: [id number] PCP: Maury Dus, MD      Visit Date: 03/14/2018   Universal Protocol:    Date/Time: 03/14/2018  Consent Given By: the patient  Position: PRONE   Additional Comments: Vital signs were monitored before and after the procedure. Patient was prepped and draped in the usual sterile fashion. The correct patient, procedure, and site was verified.   Injection Procedure Details:  Procedure Site One Meds Administered:  Meds ordered this encounter  Medications  . methylPREDNISolone acetate (DEPO-MEDROL) injection 80 mg     Laterality: Bilateral  Location/Site:  L4-L5 L5-S1  Needle size: 22 guage  Needle type: Spinal  Needle Placement: Articular  Findings:  -Comments: Excellent flow of contrast producing a partial arthrogram.  Procedure Details: The fluoroscope beam is vertically oriented in AP, and the inferior recess is visualized beneath the lower pole of the inferior apophyseal process, which represents the target point for needle insertion. When direct visualization is difficult the target point is located at the medial projection of the vertebral pedicle. The region overlying each aforementioned target is locally anesthetized with a 1 to 2 ml. volume of 1% Lidocaine without Epinephrine.   The spinal needle was inserted into each of the above mentioned facet joints using biplanar fluoroscopic guidance. A 0.25 to 0.5 ml. volume of Isovue-250 was injected and a partial facet joint arthrogram was obtained. A single spot film was obtained of the resulting arthrogram.    One to 1.25 ml of the steroid/anesthetic solution was then injected into each of the facet joints noted above.   Additional Comments:  The patient tolerated the procedure well Dressing: Band-Aid    Post-procedure details: Patient was observed during the  procedure. Post-procedure instructions were reviewed.  Patient left the clinic in stable condition.
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
## 2018-03-22 ENCOUNTER — Encounter: Payer: Self-pay | Admitting: Internal Medicine

## 2018-03-26 ENCOUNTER — Encounter: Payer: Self-pay | Admitting: Internal Medicine

## 2018-03-28 ENCOUNTER — Ambulatory Visit (INDEPENDENT_AMBULATORY_CARE_PROVIDER_SITE_OTHER): Payer: PPO | Admitting: Surgery

## 2018-03-28 ENCOUNTER — Encounter (INDEPENDENT_AMBULATORY_CARE_PROVIDER_SITE_OTHER): Payer: Self-pay | Admitting: Surgery

## 2018-03-28 VITALS — Ht 65.5 in | Wt 187.0 lb

## 2018-03-28 DIAGNOSIS — G8929 Other chronic pain: Secondary | ICD-10-CM

## 2018-03-28 DIAGNOSIS — M545 Low back pain, unspecified: Secondary | ICD-10-CM

## 2018-03-29 ENCOUNTER — Other Ambulatory Visit: Payer: Self-pay | Admitting: Rheumatology

## 2018-03-29 NOTE — Telephone Encounter (Signed)
Last Visit: 01/22/18 Next Visit: 07/24/18  Okay to refill per Dr. Estanislado Pandy

## 2018-03-29 NOTE — Progress Notes (Signed)
Office Visit Note   Patient: Tammy Mullins           Date of Birth: 1950/03/29           MRN: [id number] Visit Date: 03/28/2018              Requested by: Maury Dus, MD [address], [place] [address] PCP: Maury Dus, MD   Assessment & Plan: Visit Diagnoses:  1. Chronic low back pain, unspecified back pain laterality, unspecified whether sciatica present     Plan: Dr. Lorin Mercy did come in to speak with patient at the end of my visit.  Patient was advised that since she did not have any improvement with the lumbar ESI's whatsoever that there is not really a whole lot that we can offer her at this point.  Recommend conservative management.  Can continue heat and ice off and on as needed.  Continue home excise program that she was given by physical therapist.  Dr. Lorin Mercy advised her that hopefully her symptoms will continue to improve over time.  He did review her lumbar MRI again that she had previously.  Follow-Up Instructions: Return in about 6 months (around 09/26/2018) for with Dr Lorin Mercy.   Orders:  No orders of the defined types were placed in this encounter.  No orders of the defined types were placed in this encounter.     Procedures: No procedures performed   Clinical Data: No additional findings.   Subjective: Chief Complaint  Patient presents with  . Spine - Follow-up, Pain    HPI 68 year old white female returns.  She did have lumbar ESI's with Dr. Ernestina Patches and states that these did not give her any relief.  She continues to describe having ongoing back pain with lower extremity radicular symptoms.  Findings on previous lumbar MRI did not show a compressive lesion that would be causing patient's current symptoms. Review of Systems No current cardiac, pulm, gi issues.   Objective: Vital Signs: Ht 5' 5.5" (1.664 m)   Wt 187 lb (84.8 kg)   BMI 30.65 kg/m   Physical Exam Eyes:     Pupils: Pupils are equal, round, and reactive to  light.  Pulmonary:     Effort: No respiratory distress.  Musculoskeletal:     Comments: Lumbar paraspinal tenderness.  Neg log roll bilat hips. Neg SLR.  Neurovascularly intact. No focal motor deficits.   Neurological:     General: No focal deficit present.     Mental Status: She is alert and oriented to person, place, and time.     Ortho Exam  Specialty Comments:  No specialty comments available.  Imaging: No results found.   PMFS History: Patient Active Problem List   Diagnosis Date Noted  . Bipolar 2 disorder ([place]) 03/12/2018  . Primary osteoarthritis of right knee 05/19/2017  . Anemia 12/03/2016  . 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 History of gastroesophageal reflux (GERD) 04/19/2016  . History of hypertension 04/19/2016  . Other fatigue 03/21/2016  . Primary insomnia 03/21/2016  . History of gout 03/21/2016  . Osteoarthritis of lumbar spine 03/21/2016  . Dyslipidemia 03/21/2016  . History of IBS 03/21/2016  . History of depression 03/21/2016  . History of cholelithiasis 03/21/2016  . Pain management 03/21/2016  . Diabetes type 2, controlled ([place]) 01/09/2014  . Gout 09/20/2013  . OSA (obstructive sleep apnea) 09/16/2013  . Sleep disturbance 06/27/2013  . Chest pain, unspecified 06/06/2012  . Vaginal atrophy 03/15/2012  .  Osteopenia 03/15/2012  . Post-menopausal 03/15/2012  . Vitamin D deficiency 11/04/2010  . Depression 10/20/2007  . Chronic pain syndrome 10/20/2007  . Allergic rhinitis 10/20/2007  . HYPERCHOLESTEROLEMIA 10/03/2007  . History of cardiovascular disorder 10/03/2007  . Anxiety state 04/20/2007  . 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/2007  . Essential hypertension 04/20/2007  . GERD 04/20/2007  . Irritable bowel syndrome 04/20/2007  . Primary osteoarthritis of both hands 04/20/2007  . DE QUERVAIN'S TENOSYNOVITIS 04/20/2007  . Fibromyalgia 04/20/2007  . Diverticulosis of colon 01/09/2001   Past Medical History:  Diagnosis Date  . Allergic  rhinitis, cause unspecified   . Allergy   . Anxiety state, unspecified   . Arthritis   . Attention deficit disorder without mention of hyperactivity   . Barrett's esophagus   . Blood transfusion without reported diagnosis 2018-10  . Chronic pain syndrome   . 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 Depression   . Diabetes type 2, controlled ([place]) 01/09/2014  . Diverticulosis of colon (without mention of hemorrhage)   . Esophageal reflux   . Fibromyalgia   . Gout, unspecified   . Hiatal hernia   . Irritable bowel syndrome   . 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 Myalgia and myositis, unspecified   . Osteoarthrosis, unspecified whether generalized or localized, unspecified site   . Personal history of unspecified circulatory disease   . Pure hypercholesterolemia   . Radial styloid tenosynovitis   . Unspecified essential hypertension   . Unspecified nonpsychotic mental disorder   . Unspecified pruritic disorder     Family History  Problem Relation Age of Onset  . Cirrhosis Father   . Cancer Maternal Grandmother        ?  . Lung disease Mother        ?   . Glaucoma Mother   . Glaucoma Brother   . Prostate cancer Brother   . Heart disease Sister   . Kidney disease Neg Hx   . Diabetes Neg Hx   . Colon cancer Neg Hx   . Colon polyps Neg Hx   . Rectal cancer Neg Hx   . Stomach cancer Neg Hx     Past Surgical History:  Procedure Laterality Date  . ANKLE SURGERY Right   . APPENDECTOMY    . CARPAL TUNNEL RELEASE Right   . CERVICAL SPINE SURGERY     plates and pins  . CHOLECYSTECTOMY N/A 06/26/2015   Procedure: LAPAROSCOPIC CHOLECYSTECTOMY;  Surgeon: Ralene Ok, MD;  Location: [facility];  Service: General;  Laterality: N/A;  . COLONOSCOPY    . COLONOSCOPY  2019  . ESOPHAGOGASTRODUODENOSCOPY    . ESOPHAGOGASTRODUODENOSCOPY N/A 12/04/2016   Procedure: ESOPHAGOGASTRODUODENOSCOPY (EGD);  Surgeon: Ladene Artist, MD;  Location[facility] ENDOSCOPY;  Service: Endoscopy;   Laterality: N/A;  . LAPAROSCOPIC APPENDECTOMY N/A 06/26/2015   Procedure: LAPAROSCOPIC APPENDECTOMY;  Surgeon: Ralene Ok, MD;  Location: [facility];  Service: General;  Laterality: N/A;  . ROTATOR CUFF REPAIR Right   . SALPINGOOPHORECTOMY  Left   ectopic 1980  . TOTAL SHOULDER ARTHROPLASTY    . UPPER GASTROINTESTINAL ENDOSCOPY     Social History   Occupational History  . Occupation: retired  Tobacco Use  . Smoking status: Never Smoker  . Smokeless tobacco: Never Used  Substance and Sexual Activity  . Alcohol use: No  . Drug use: No  . Sexual activity: Never

## 2018-04-05 ENCOUNTER — Telehealth (INDEPENDENT_AMBULATORY_CARE_PROVIDER_SITE_OTHER): Payer: Self-pay | Admitting: Orthopaedic Surgery

## 2018-04-05 NOTE — Telephone Encounter (Signed)
Patient called asked if Dr Lorin Mercy will call her something in for Nausea. Patient said she has been nauseated for the past 2 days. The number to contact patient is [phone]

## 2018-04-05 NOTE — Telephone Encounter (Signed)
I called no answer. Not sure what is causing nausea , she can check with her PCP thanks ucall.

## 2018-04-05 NOTE — Telephone Encounter (Signed)
Please advise 

## 2018-04-06 NOTE — Telephone Encounter (Signed)
I called, no answer.  Will wait for return call from patient.

## 2018-04-06 NOTE — Telephone Encounter (Signed)
I called, no answer.  ?

## 2018-04-18 ENCOUNTER — Ambulatory Visit (INDEPENDENT_AMBULATORY_CARE_PROVIDER_SITE_OTHER): Payer: PPO | Admitting: Psychiatry

## 2018-04-18 DIAGNOSIS — F329 Major depressive disorder, single episode, unspecified: Secondary | ICD-10-CM

## 2018-04-18 DIAGNOSIS — F3181 Bipolar II disorder: Secondary | ICD-10-CM | POA: Diagnosis not present

## 2018-04-18 DIAGNOSIS — F32A Depression, unspecified: Secondary | ICD-10-CM

## 2018-04-18 MED ORDER — DULOXETINE HCL 30 MG PO CPEP: ORAL_CAPSULE | ORAL | 1 refills | Status: DC

## 2018-04-18 MED ORDER — QUETIAPINE FUMARATE 300 MG PO TABS: 300.0000 mg | ORAL_TABLET | Freq: Every day | ORAL | 1 refills | Status: DC

## 2018-04-18 NOTE — Progress Notes (Signed)
[facility]  Patient ID: Tammy Mullins,  MRN: [id number]  PCP: Maury Dus, MD  Date of Evaluation: 04/18/2018 Time spent:20 minutes  Chief Complaint:   HISTORY/CURRENT STATUS: HPI patient last seen July of last year.  She was doing well.  She did have some fatigue. She has had worsening of depression for the last 2 or 3 months where she will have crying spells, isolate, decrease productive sleep.  She also has more anxiety where her mind wanders, she had a chest tight without radiation, sweats. He has had a lot of problems with her back.  She is on pain medications for that.  Individual Medical History/ Review of Systems: Changes? :No   Allergies: Crestor [rosuvastatin calcium]; Asa [aspirin]; and Lyrica [pregabalin]  Current Medications:  Current Outpatient Medications:  .  DULoxetine (CYMBALTA) 30 MG capsule, 3  Per day, Disp: 90 capsule, Rfl: 1 .  gabapentin (NEURONTIN) 600 MG tablet, TAKE 1 TABLET(600 MG) BY MOUTH AT BEDTIME, Disp: 30 tablet, Rfl: 0 .  QUEtiapine (SEROQUEL) 300 MG tablet, Take 1 tablet (300 mg total) by mouth at bedtime., Disp: 90 tablet, Rfl: 1 .  allopurinol (ZYLOPRIM) 300 MG tablet, Take 1.5 tablets (450 mg total) by mouth daily., Disp: 45 tablet, Rfl: 5 .  amLODipine (NORVASC) 10 MG tablet, TAKE 1 TABLET DAILY, Disp: 90 tablet, Rfl: 1 .  aspirin 81 MG tablet, Take 81 mg by mouth daily.  , Disp: , Rfl:  .  baclofen (LIORESAL) 10 MG tablet, TAKE 1 TABLET BY MOUTH THREE TIMES DAILY AS NEEDED FOR MUSCLE SPASMS, Disp: 90 tablet, Rfl: 0 .  calcium carbonate (OSCAL) 1500 (600 Ca) MG TABS tablet, Take 600 mg of elemental calcium by mouth daily with breakfast., Disp: , Rfl:  .  Cholecalciferol (VITAMIN D) 2000 UNITS CAPS, Take 2,000 Units by mouth daily. , Disp: , Rfl:  .  diclofenac sodium (VOLTAREN) 1 % GEL, Apply 3 grams to 3 large joints, up to 3 times daily., Disp: 3 Tube, Rfl: 3 .  dicyclomine (BENTYL) 20 MG tablet, Take 1 tablet (20 mg total)  by mouth 3 (three) times daily as needed., Disp: 90 tablet, Rfl: 0 .  ferrous sulfate 325 (65 FE) MG tablet, Take 1 tablet (325 mg total) by mouth 2 (two) times daily with a meal., Disp: , Rfl: 3 .  fluticasone (FLONASE) 50 MCG/ACT nasal spray, Place 2 sprays into both nostrils at bedtime. (Patient taking differently: Place 2 sprays into both nostrils as needed. ), Disp: 48 g, Rfl: 1 .  hydrochlorothiazide (HYDRODIURIL) 25 MG tablet, Take 1 tablet (25 mg total) by mouth daily., Disp: 30 tablet, Rfl: 2 .  hyoscyamine (LEVSIN SL) 0.125 MG SL tablet, DISSOLVE 1-2 TS UNT Q 4-6 H PRN FOR ABDOMINAL CRAMPING, Disp: , Rfl: 4 .  losartan (COZAAR) 100 MG tablet, Take 1 tablet (100 mg total) by mouth daily., Disp: 30 tablet, Rfl: 5 .  methocarbamol (ROBAXIN) 500 MG tablet, Take 500 mg by mouth 4 (four) times daily. 1.5 tablets Q 4 hours, Disp: , Rfl:  .  metoprolol succinate (TOPROL-XL) 100 MG 24 hr tablet, TAKE 1 TABLET DAILY, Disp: 90 tablet, Rfl: 1 .  mometasone (NASONEX) 50 MCG/ACT nasal spray, Place 2 sprays into the nose daily as needed (FOR ALLERGIES)., Disp: , Rfl:  .  omeprazole (PRILOSEC) 20 MG capsule, Take 20 mg by mouth daily., Disp: , Rfl:  .  pantoprazole (PROTONIX) 40 MG tablet, Take 1 tablet (40 mg total) by mouth daily., Disp:  30 tablet, Rfl: 0 .  raloxifene (EVISTA) 60 MG tablet, Take 60 mg by mouth daily. , Disp: , Rfl:   Current Facility-Administered Medications:  .  0.9 %  sodium chloride infusion, 500 mL, Intravenous, Once, Gatha Mayer, MD .  methylPREDNISolone acetate (DEPO-MEDROL) injection 80 mg, 80 mg, Other, Once, Magnus Sinning, MD Medication Side Effects: none  Family Medical/ Social History: Changes?no  MENTAL HEALTH EXAM:  There were no vitals taken for this visit.There is no height or weight on file to calculate BMI.  General Appearance: Casual  Eye Contact:  Poor  Speech:  Clear and Coherent  Volume:  Normal  Mood:  Depressed in pain  Affect:  Appropriate   Thought Process:  Linear  Orientation:  Full (Time, Place, and Person)  Thought Content: Logical   Suicidal Thoughts:  No  Homicidal Thoughts:  No  Memory:  WNL  Judgement:  Good  Insight:  Fair  Psychomotor Activity:  Normal  Concentration:  Concentration: Good  Recall:  Good  Fund of Knowledge: Good  Language: Good  Assets:  Desire for Improvement  ADL's:  Intact  Cognition: WNL  Prognosis:  Fair    DIAGNOSES:    ICD-10-CM   1. Depression, unspecified depression type F32.9   2. Bipolar 2 disorder ([place]) F31.81     Receiving Psychotherapy: No    RECOMMENDATIONS: Patient will fill out a mood diary so we can follow her moods.  She has a past diagnosis of bipolar disorder 2.  She is mainly being treated for depression right now.  We will increase her Cymbalta 60 mg to 90 mg a day, keep his Seroquel at 300 mg a day,.  She is to tell her family doctor about her chest pain with panic attacks.  Also going to talk to him about a sleep study.  PCP is aware of her elevated lipids and glucose levels.  At her next visit we will see if she get those labs drawn if not we will do.   Comer Locket, PA-C

## 2018-04-20 ENCOUNTER — Other Ambulatory Visit: Payer: Self-pay | Admitting: Psychiatry

## 2018-04-28 ENCOUNTER — Other Ambulatory Visit: Payer: Self-pay | Admitting: Rheumatology

## 2018-04-30 NOTE — Telephone Encounter (Signed)
Last Visit: 01/22/18 Next Visit: 07/24/18  Okay to refill per Dr. Estanislado Pandy

## 2018-05-03 ENCOUNTER — Telehealth (INDEPENDENT_AMBULATORY_CARE_PROVIDER_SITE_OTHER): Payer: Self-pay | Admitting: Orthopaedic Surgery

## 2018-05-03 NOTE — Telephone Encounter (Signed)
She will talk with Dr. Alyson Ingles about possible  Mobic or Celebrex, she had Hx of some stomach problems. Would not restart narcotics meds for a chronic back problem with only mild degen changes. FYi

## 2018-05-03 NOTE — Telephone Encounter (Signed)
Patient called asked if she can get something prescribed for the pain in her lower back. Patient said she is currently taking Gabapentin 600mg   tabs and Methocarbamol 500mg  tabs and it's helping very little.  The number to contact patient is [phone]

## 2018-05-03 NOTE — Telephone Encounter (Signed)
noted 

## 2018-05-03 NOTE — Telephone Encounter (Signed)
Please advise 

## 2018-05-16 ENCOUNTER — Other Ambulatory Visit: Payer: Self-pay | Admitting: Physician Assistant

## 2018-05-16 ENCOUNTER — Ambulatory Visit: Payer: PPO | Admitting: Psychiatry

## 2018-05-17 NOTE — Telephone Encounter (Signed)
Last Visit: 01/22/18 Next Visit: 07/24/18  Okay to refill per Dr. Estanislado Pandy

## 2018-05-21 DIAGNOSIS — K219 Gastro-esophageal reflux disease without esophagitis: Secondary | ICD-10-CM | POA: Diagnosis not present

## 2018-05-25 ENCOUNTER — Ambulatory Visit: Payer: PPO | Admitting: Psychiatry

## 2018-05-27 ENCOUNTER — Other Ambulatory Visit: Payer: Self-pay | Admitting: Rheumatology

## 2018-05-28 NOTE — Telephone Encounter (Signed)
Last Visit: 01/22/18 Next Visit: 07/24/18  Okay to refill per Dr. Estanislado Pandy

## 2018-05-29 DIAGNOSIS — D500 Iron deficiency anemia secondary to blood loss (chronic): Secondary | ICD-10-CM | POA: Diagnosis not present

## 2018-05-29 DIAGNOSIS — F324 Major depressive disorder, single episode, in partial remission: Secondary | ICD-10-CM | POA: Diagnosis not present

## 2018-05-29 DIAGNOSIS — D509 Iron deficiency anemia, unspecified: Secondary | ICD-10-CM | POA: Diagnosis not present

## 2018-05-29 DIAGNOSIS — I10 Essential (primary) hypertension: Secondary | ICD-10-CM | POA: Diagnosis not present

## 2018-06-14 ENCOUNTER — Other Ambulatory Visit: Payer: Self-pay | Admitting: Psychiatry

## 2018-06-26 DIAGNOSIS — D509 Iron deficiency anemia, unspecified: Secondary | ICD-10-CM | POA: Diagnosis not present

## 2018-06-26 DIAGNOSIS — F324 Major depressive disorder, single episode, in partial remission: Secondary | ICD-10-CM | POA: Diagnosis not present

## 2018-06-26 DIAGNOSIS — I10 Essential (primary) hypertension: Secondary | ICD-10-CM | POA: Diagnosis not present

## 2018-06-26 DIAGNOSIS — D500 Iron deficiency anemia secondary to blood loss (chronic): Secondary | ICD-10-CM | POA: Diagnosis not present

## 2018-06-26 DIAGNOSIS — M8580 Other specified disorders of bone density and structure, unspecified site: Secondary | ICD-10-CM | POA: Diagnosis not present

## 2018-06-27 ENCOUNTER — Other Ambulatory Visit: Payer: Self-pay | Admitting: Rheumatology

## 2018-06-27 NOTE — Telephone Encounter (Signed)
Last Visit: 01/22/2018 Next Visit:message sent to  The front desk to schedule.   Okay to refill per Dr.Deveshwar.

## 2018-07-12 ENCOUNTER — Telehealth: Payer: Self-pay | Admitting: Orthopaedic Surgery
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
## 2018-07-12 NOTE — Telephone Encounter (Signed)
OK for her to come in and get fitted for lumbar corset thanks

## 2018-07-12 NOTE — Telephone Encounter (Signed)
Pt called in left vm sating that shes been having severe pain for the past 3-days and shes having trouble walking and she wanted to know if she could get a back brace? And if you didn't happen to have one if you could tell her where she may happen to find one.  [phone]

## 2018-07-12 NOTE — Telephone Encounter (Signed)
I called patient and advised. She does not know if she will be able to make it tomorrow or the beginning of next week. She will tell them at the front desk that she is coming in to see Gwinda Passe for a back brace when she is able to get there. No nurse appointment made due to patient not being sure when she can make it in.

## 2018-07-12 NOTE — Telephone Encounter (Signed)
Please advise 

## 2018-07-16 DIAGNOSIS — M545 Low back pain: Secondary | ICD-10-CM | POA: Diagnosis not present

## 2018-07-16 NOTE — Telephone Encounter (Signed)
Patient came in and was fitted for lumbar corset.  Brace made her feel much better.

## 2018-07-24 ENCOUNTER — Encounter: Payer: Self-pay | Admitting: Rheumatology

## 2018-07-24 ENCOUNTER — Ambulatory Visit (INDEPENDENT_AMBULATORY_CARE_PROVIDER_SITE_OTHER): Payer: PPO

## 2018-07-24 ENCOUNTER — Ambulatory Visit: Payer: PPO | Admitting: Rheumatology

## 2018-07-24 ENCOUNTER — Other Ambulatory Visit: Payer: Self-pay

## 2018-07-24 VITALS — BP 136/87 | HR 97 | Resp 14 | Ht 65.5 in | Wt 191.6 lb

## 2018-07-24 DIAGNOSIS — M47816 Spondylosis without myelopathy or radiculopathy, lumbar region: Secondary | ICD-10-CM

## 2018-07-24 DIAGNOSIS — F5101 Primary insomnia: Secondary | ICD-10-CM

## 2018-07-24 DIAGNOSIS — M797 Fibromyalgia: Secondary | ICD-10-CM | POA: Diagnosis not present

## 2018-07-24 DIAGNOSIS — M25511 Pain in right shoulder: Secondary | ICD-10-CM | POA: Diagnosis not present

## 2018-07-24 DIAGNOSIS — M8589 Other specified disorders of bone density and structure, multiple sites: Secondary | ICD-10-CM | POA: Diagnosis not present

## 2018-07-24 DIAGNOSIS — Z8679 Personal history of other diseases of the circulatory system: Secondary | ICD-10-CM | POA: Diagnosis not present

## 2018-07-24 DIAGNOSIS — G894 Chronic pain syndrome: Secondary | ICD-10-CM | POA: Diagnosis not present

## 2018-07-24 DIAGNOSIS — G8929 Other chronic pain: Secondary | ICD-10-CM

## 2018-07-24 DIAGNOSIS — M1A09X0 Idiopathic chronic gout, multiple sites, without tophus (tophi): Secondary | ICD-10-CM

## 2018-07-24 DIAGNOSIS — R5383 Other fatigue: Secondary | ICD-10-CM

## 2018-07-24 DIAGNOSIS — F419 Anxiety disorder, unspecified: Secondary | ICD-10-CM

## 2018-07-24 DIAGNOSIS — M19042 Primary osteoarthritis, left hand: Secondary | ICD-10-CM

## 2018-07-24 DIAGNOSIS — F32A Depression, unspecified: Secondary | ICD-10-CM

## 2018-07-24 DIAGNOSIS — Z5181 Encounter for therapeutic drug level monitoring: Secondary | ICD-10-CM | POA: Diagnosis not present

## 2018-07-24 DIAGNOSIS — M19041 Primary osteoarthritis, right hand: Secondary | ICD-10-CM | POA: Diagnosis not present

## 2018-07-24 DIAGNOSIS — Z8719 Personal history of other diseases of the digestive system: Secondary | ICD-10-CM

## 2018-07-24 DIAGNOSIS — F329 Major depressive disorder, single episode, unspecified: Secondary | ICD-10-CM

## 2018-07-24 MED ORDER — TRIAMCINOLONE ACETONIDE 40 MG/ML IJ SUSP
40.0000 mg | INTRAMUSCULAR | Status: AC | PRN
  Administered 2018-07-24: 40 mg via INTRA_ARTICULAR

## 2018-07-24 MED ORDER — LIDOCAINE HCL 1 % IJ SOLN
1.5000 mL | INTRAMUSCULAR | Status: AC | PRN
  Administered 2018-07-24: 1.5 mL

## 2018-07-25 LAB — COMPLETE METABOLIC PANEL WITH GFR
AG Ratio: 1.6 (calc) (ref 1.0–2.5)
ALT: 25 U/L (ref 6–29)
AST: 23 U/L (ref 10–35)
Albumin: 4.3 g/dL (ref 3.6–5.1)
Alkaline phosphatase (APISO): 72 U/L (ref 37–153)
BUN: 14 mg/dL (ref 7–25)
CO2: 35 mmol/L — ABNORMAL HIGH (ref 20–32)
Calcium: 10.4 mg/dL (ref 8.6–10.4)
Chloride: 99 mmol/L (ref 98–110)
Creat: 0.91 mg/dL (ref 0.50–0.99)
GFR, Est African American: 75 mL/min/{1.73_m2} (ref >=60)
GFR, Est Non African American: 64 mL/min/{1.73_m2} (ref >=60)
Globulin: 2.7 g/dL (calc) (ref 1.9–3.7)
Glucose, Bld: 98 mg/dL (ref 65–99)
Potassium: 4 mmol/L (ref 3.5–5.3)
Sodium: 140 mmol/L (ref 135–146)
Total Bilirubin: 0.3 mg/dL (ref 0.2–1.2)
Total Protein: 7 g/dL (ref 6.1–8.1)

## 2018-07-25 LAB — CBC WITH DIFFERENTIAL/PLATELET
Absolute Monocytes: 355 cells/uL (ref 200–950)
Basophils Absolute: 55 cells/uL (ref 0–200)
Basophils Relative: 0.6 %
Eosinophils Absolute: 209 cells/uL (ref 15–500)
Eosinophils Relative: 2.3 %
HCT: 38.6 % (ref 35.0–45.0)
Hemoglobin: 12.4 g/dL (ref 11.7–15.5)
Lymphs Abs: 2266 cells/uL (ref 850–3900)
MCH: 30.3 pg (ref 27.0–33.0)
MCHC: 32.1 g/dL (ref 32.0–36.0)
MCV: 94.4 fL (ref 80.0–100.0)
MPV: 9.5 fL (ref 7.5–12.5)
Monocytes Relative: 3.9 %
Neutro Abs: 6215 cells/uL (ref 1500–7800)
Neutrophils Relative %: 68.3 %
Platelets: 313 10*3/uL (ref 140–400)
RBC: 4.09 10*6/uL (ref 3.80–5.10)
RDW: 13.1 % (ref 11.0–15.0)
Total Lymphocyte: 24.9 %
WBC: 9.1 10*3/uL (ref 3.8–10.8)

## 2018-07-25 LAB — URIC ACID: Uric Acid, Serum: 4.3 mg/dL (ref 2.5–7.0)

## 2018-07-30 ENCOUNTER — Other Ambulatory Visit: Payer: Self-pay | Admitting: Physician Assistant

## 2018-07-30 NOTE — Telephone Encounter (Signed)
Last Visit: 07/24/18 Next Visit: 01/22/19   Okay to refill per Dr. Estanislado Pandy

## 2018-08-06 ENCOUNTER — Other Ambulatory Visit: Payer: Self-pay | Admitting: Rheumatology

## 2018-08-06 NOTE — Telephone Encounter (Signed)
Last Visit: 07/24/18 Next Visit: 01/22/19  Okay to refill per Dr. Estanislado Pandy

## 2018-08-08 ENCOUNTER — Telehealth: Payer: Self-pay

## 2018-08-08 ENCOUNTER — Telehealth: Payer: Self-pay | Admitting: Orthopaedic Surgery

## 2018-08-08 NOTE — Telephone Encounter (Signed)
I called and spoke with patient. She states that Dr. Estanislado Pandy put her on gabapentin and methocarbamol but they are not helping her back pain at all. She is unable to sleep and states that the pain is the worst it has been. She had a previous injection with Dr. Ernestina Patches on 03/14/2018 which she says made the pain worse.  She would like to know if she can have something for pain.   On a side note, during our discussion, the patient did state that she was hurting all over. She does not know if she is running a fever. She has had a headache for three days.  I advised patient to call her PCP to discuss.

## 2018-08-08 NOTE — Telephone Encounter (Signed)
Patient called and left VM stated that meds not doing any good. Taking gabapentin  And another pain med that was prescribed but can't find it. Patient stated had been walking but that made pain severe and has a headache.  Tried calling patien back to send to triage but no answer.  [phone]

## 2018-08-08 NOTE — Telephone Encounter (Signed)
Patient LM on triage VM advising that she is having a lot of pain in her back and legs and is needing increased dose of pain medication She also mentioned a headache for the last 3 days which is very abnormal for her.  [phone]

## 2018-08-08 NOTE — Telephone Encounter (Signed)
Noted. I have spoken with patient as well. Message sent to Dr. Lorin Mercy to advise and pt encouraged to call PCP in regards to pain all over and headache x 3 days.

## 2018-08-09 MED ORDER — TRAMADOL HCL 50 MG PO TABS: 50.0000 mg | ORAL_TABLET | Freq: Every evening | ORAL | 0 refills | Status: DC | PRN

## 2018-08-09 NOTE — Telephone Encounter (Signed)
Called to pharmacy. Patient spoke with Dr. Lorin Mercy.

## 2018-08-09 NOTE — Telephone Encounter (Signed)
Send in ultram # 15   One po q HS  Prn pain.  No refills.

## 2018-08-17 ENCOUNTER — Telehealth: Payer: Self-pay | Admitting: Orthopaedic Surgery

## 2018-08-17 NOTE — Telephone Encounter (Signed)
Please advise 

## 2018-08-17 NOTE — Telephone Encounter (Signed)
Pt called to states she still having severe pain and wants to know if Dr Lorin Mercy would give her a Rx refill on the pain medication.( She states he only gave her 7 pills on 08/08/18) She also wants to know if the MRI was order.
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
## 2018-08-21 ENCOUNTER — Other Ambulatory Visit: Payer: Self-pay

## 2018-08-21 MED ORDER — DULOXETINE HCL 60 MG PO CPEP: ORAL_CAPSULE | ORAL | 1 refills | Status: DC

## 2018-08-22 ENCOUNTER — Other Ambulatory Visit: Payer: Self-pay

## 2018-08-22 ENCOUNTER — Ambulatory Visit (INDEPENDENT_AMBULATORY_CARE_PROVIDER_SITE_OTHER): Payer: PPO | Admitting: Orthopaedic Surgery

## 2018-08-22 ENCOUNTER — Encounter: Payer: Self-pay | Admitting: Orthopaedic Surgery

## 2018-08-22 VITALS — Ht 65.5 in | Wt 179.0 lb

## 2018-08-22 DIAGNOSIS — G8929 Other chronic pain: Secondary | ICD-10-CM | POA: Diagnosis not present

## 2018-08-22 DIAGNOSIS — M545 Low back pain: Secondary | ICD-10-CM | POA: Diagnosis not present
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
## 2018-08-23 ENCOUNTER — Other Ambulatory Visit: Payer: Self-pay | Admitting: Rheumatology

## 2018-08-23 NOTE — Telephone Encounter (Signed)
Last Visit: 07/24/18 Next Visit: 01/22/19  Okay to refill per Dr. Estanislado Pandy

## 2018-08-29 DIAGNOSIS — G44209 Tension-type headache, unspecified, not intractable: Secondary | ICD-10-CM | POA: Diagnosis not present

## 2018-08-30 ENCOUNTER — Ambulatory Visit: Payer: PPO | Admitting: Physician Assistant

## 2018-09-25 ENCOUNTER — Other Ambulatory Visit: Payer: Self-pay | Admitting: Rheumatology

## 2018-09-25 NOTE — Telephone Encounter (Signed)
Last Visit: 07/24/18 Next Visit: 01/22/19  Okay to refill per Dr. Estanislado Pandy

## 2018-09-26 ENCOUNTER — Ambulatory Visit: Payer: Self-pay | Admitting: Orthopaedic Surgery

## 2018-10-02 ENCOUNTER — Ambulatory Visit: Payer: PPO | Admitting: Physician Assistant

## 2018-10-02 ENCOUNTER — Ambulatory Visit: Payer: PPO | Admitting: Orthopaedic Surgery

## 2018-10-03 ENCOUNTER — Ambulatory Visit: Payer: PPO | Admitting: Orthopaedic Surgery

## 2018-10-03 DIAGNOSIS — K219 Gastro-esophageal reflux disease without esophagitis: Secondary | ICD-10-CM | POA: Diagnosis not present

## 2018-10-03 DIAGNOSIS — K589 Irritable bowel syndrome without diarrhea: Secondary | ICD-10-CM | POA: Diagnosis not present

## 2018-10-03 DIAGNOSIS — D509 Iron deficiency anemia, unspecified: Secondary | ICD-10-CM | POA: Diagnosis not present

## 2018-10-03 DIAGNOSIS — R7303 Prediabetes: Secondary | ICD-10-CM | POA: Diagnosis not present

## 2018-10-03 DIAGNOSIS — I10 Essential (primary) hypertension: Secondary | ICD-10-CM | POA: Diagnosis not present

## 2018-10-03 DIAGNOSIS — G44209 Tension-type headache, unspecified, not intractable: Secondary | ICD-10-CM | POA: Diagnosis not present

## 2018-10-03 DIAGNOSIS — E2839 Other primary ovarian failure: Secondary | ICD-10-CM | POA: Diagnosis not present

## 2018-10-03 DIAGNOSIS — M8580 Other specified disorders of bone density and structure, unspecified site: Secondary | ICD-10-CM | POA: Diagnosis not present

## 2018-10-11 DIAGNOSIS — M545 Low back pain: Secondary | ICD-10-CM | POA: Diagnosis not present

## 2018-10-19 ENCOUNTER — Other Ambulatory Visit: Payer: Self-pay | Admitting: Physician Assistant

## 2018-10-25 ENCOUNTER — Other Ambulatory Visit: Payer: Self-pay | Admitting: Rheumatology

## 2018-10-25 DIAGNOSIS — D500 Iron deficiency anemia secondary to blood loss (chronic): Secondary | ICD-10-CM | POA: Diagnosis not present

## 2018-10-25 DIAGNOSIS — D509 Iron deficiency anemia, unspecified: Secondary | ICD-10-CM | POA: Diagnosis not present

## 2018-10-25 DIAGNOSIS — R7303 Prediabetes: Secondary | ICD-10-CM | POA: Diagnosis not present

## 2018-10-25 DIAGNOSIS — I10 Essential (primary) hypertension: Secondary | ICD-10-CM | POA: Diagnosis not present

## 2018-10-25 NOTE — Telephone Encounter (Signed)
Last Visit: 07/24/18 Next Visit: 01/22/19   Okay to refill per Dr. Deveshwar  

## 2018-10-29 ENCOUNTER — Other Ambulatory Visit: Payer: Self-pay | Admitting: Rheumatology

## 2018-10-29 NOTE — Telephone Encounter (Signed)
Last Visit: 07/24/18 Next Visit: 01/22/19 Labs: 07/24/18 CBC WNL CMP stable  Okay to refill per Dr. Estanislado Pandy

## 2018-11-09 ENCOUNTER — Other Ambulatory Visit: Payer: Self-pay

## 2018-11-09 ENCOUNTER — Ambulatory Visit (INDEPENDENT_AMBULATORY_CARE_PROVIDER_SITE_OTHER): Payer: PPO | Admitting: Physician Assistant

## 2018-11-09 ENCOUNTER — Encounter: Payer: Self-pay | Admitting: Physician Assistant

## 2018-11-09 DIAGNOSIS — F329 Major depressive disorder, single episode, unspecified: Secondary | ICD-10-CM | POA: Diagnosis not present

## 2018-11-09 DIAGNOSIS — R41840 Attention and concentration deficit: Secondary | ICD-10-CM | POA: Diagnosis not present

## 2018-11-09 DIAGNOSIS — F32A Depression, unspecified: Secondary | ICD-10-CM

## 2018-11-09 MED ORDER — QUETIAPINE FUMARATE 300 MG PO TABS: 300.0000 mg | ORAL_TABLET | Freq: Every day | ORAL | 0 refills | Status: DC

## 2018-11-09 MED ORDER — B COMPLEX VITAMINS PO CAPS: 1.0000 | ORAL_CAPSULE | Freq: Every day | ORAL | 11 refills | Status: DC

## 2018-11-09 MED ORDER — DULOXETINE HCL 60 MG PO CPEP: 120.0000 mg | ORAL_CAPSULE | Freq: Every day | ORAL | 0 refills | Status: DC
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
## 2018-11-13 DIAGNOSIS — M47816 Spondylosis without myelopathy or radiculopathy, lumbar region: Secondary | ICD-10-CM | POA: Diagnosis not present

## 2018-11-15 ENCOUNTER — Other Ambulatory Visit: Payer: Self-pay | Admitting: Family Medicine

## 2018-11-15 DIAGNOSIS — Z1231 Encounter for screening mammogram for malignant neoplasm of breast: Secondary | ICD-10-CM

## 2018-11-17 ENCOUNTER — Other Ambulatory Visit: Payer: Self-pay | Admitting: Physician Assistant

## 2018-11-22 ENCOUNTER — Other Ambulatory Visit: Payer: Self-pay | Admitting: Rheumatology

## 2018-11-22 NOTE — Telephone Encounter (Signed)
Last Visit: 07/24/18 Next Visit: 01/22/19   Okay to refill per Dr. Deveshwar  

## 2018-11-27 DIAGNOSIS — M47816 Spondylosis without myelopathy or radiculopathy, lumbar region: Secondary | ICD-10-CM | POA: Diagnosis not present

## 2018-12-06 ENCOUNTER — Encounter (INDEPENDENT_AMBULATORY_CARE_PROVIDER_SITE_OTHER): Payer: Self-pay | Admitting: Physical Medicine and Rehabilitation
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
## 2018-12-25 ENCOUNTER — Other Ambulatory Visit: Payer: Self-pay | Admitting: Rheumatology

## 2018-12-25 NOTE — Telephone Encounter (Signed)
Last Visit: 07/24/18 Next Visit: 01/22/19   Okay to refill per Dr. Deveshwar  

## 2019-01-03 ENCOUNTER — Ambulatory Visit: Payer: PPO
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
## 2019-01-09 DIAGNOSIS — M4316 Spondylolisthesis, lumbar region: Secondary | ICD-10-CM | POA: Diagnosis not present

## 2019-01-10 ENCOUNTER — Other Ambulatory Visit: Payer: Self-pay | Admitting: Neurological Surgery

## 2019-01-14 ENCOUNTER — Ambulatory Visit (INDEPENDENT_AMBULATORY_CARE_PROVIDER_SITE_OTHER): Payer: PPO | Admitting: Neurology

## 2019-01-14 ENCOUNTER — Other Ambulatory Visit: Payer: Self-pay

## 2019-01-14 ENCOUNTER — Encounter: Payer: Self-pay | Admitting: Neurology

## 2019-01-14 VITALS — BP 147/82 | HR 89 | Temp 98.2°F | Ht 65.5 in | Wt 188.5 lb

## 2019-01-14 DIAGNOSIS — R519 Headache, unspecified: Secondary | ICD-10-CM | POA: Insufficient documentation

## 2019-01-14 MED ORDER — BUTALBITAL-APAP-CAFFEINE 50-325-40 MG PO TABS: 1.0000 | ORAL_TABLET | Freq: Once | ORAL | 5 refills | Status: DC | PRN

## 2019-01-14 MED ORDER — AIMOVIG 70 MG/ML ~~LOC~~ SOAJ: 70.0000 mg | SUBCUTANEOUS | 11 refills | Status: DC
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
## 2019-01-15 ENCOUNTER — Telehealth: Payer: Self-pay | Admitting: *Deleted

## 2019-01-15 DIAGNOSIS — I10 Essential (primary) hypertension: Secondary | ICD-10-CM | POA: Diagnosis not present

## 2019-01-15 DIAGNOSIS — D509 Iron deficiency anemia, unspecified: Secondary | ICD-10-CM | POA: Diagnosis not present

## 2019-01-15 DIAGNOSIS — G44209 Tension-type headache, unspecified, not intractable: Secondary | ICD-10-CM | POA: Diagnosis not present

## 2019-01-15 DIAGNOSIS — R7303 Prediabetes: Secondary | ICD-10-CM | POA: Diagnosis not present

## 2019-01-15 DIAGNOSIS — Z0000 Encounter for general adult medical examination without abnormal findings: Secondary | ICD-10-CM | POA: Diagnosis not present

## 2019-01-15 DIAGNOSIS — Z23 Encounter for immunization: Secondary | ICD-10-CM | POA: Diagnosis not present

## 2019-01-15 DIAGNOSIS — Z1389 Encounter for screening for other disorder: Secondary | ICD-10-CM | POA: Diagnosis not present

## 2019-01-15 DIAGNOSIS — K589 Irritable bowel syndrome without diarrhea: Secondary | ICD-10-CM | POA: Diagnosis not present

## 2019-01-15 DIAGNOSIS — M8580 Other specified disorders of bone density and structure, unspecified site: Secondary | ICD-10-CM | POA: Diagnosis not present

## 2019-01-15 DIAGNOSIS — K219 Gastro-esophageal reflux disease without esophagitis: Secondary | ICD-10-CM | POA: Diagnosis not present

## 2019-01-15 DIAGNOSIS — E2839 Other primary ovarian failure: Secondary | ICD-10-CM | POA: Diagnosis not present

## 2019-01-15 DIAGNOSIS — Z13220 Encounter for screening for lipoid disorders: Secondary | ICD-10-CM | POA: Diagnosis not present

## 2019-01-15 LAB — SEDIMENTATION RATE: Sed Rate: 6 mm/hr (ref 0–40)

## 2019-01-15 LAB — TSH: TSH: 1.48 u[IU]/mL (ref 0.450–4.500)

## 2019-01-15 LAB — C-REACTIVE PROTEIN: CRP: <1 mg/L (ref 0–10)

## 2019-01-15 NOTE — Telephone Encounter (Signed)
Left message letting her know labs were normal.  Provided our number to call back with any questions.

## 2019-01-15 NOTE — Telephone Encounter (Signed)
PA for Aimovig started through covermymeds (key: [id number]).  Pt has coverage w/ [employer] [phone]).  Pt [id number].  Decision pending.

## 2019-01-15 NOTE — Telephone Encounter (Signed)
-----   Message from Marcial Pacas, MD sent at 01/15/2019 11:57 AM EST ----- Please call patient for normal laboratory result

## 2019-01-17 NOTE — Telephone Encounter (Signed)
PA approved through 02/28/2020

## 2019-01-18 ENCOUNTER — Telehealth: Payer: Self-pay | Admitting: Neurology

## 2019-01-18 NOTE — Telephone Encounter (Signed)
Pt has called to report that she is unable to afford Erenumab-aooe ([place]) 55 MG/ML SOAJ.  Pt is asking for RN to call her on Monday about other possible medications

## 2019-01-19 ENCOUNTER — Other Ambulatory Visit: Payer: Self-pay | Admitting: Rheumatology

## 2019-01-21 MED ORDER — TOPIRAMATE 50 MG PO TABS: 50.0000 mg | ORAL_TABLET | Freq: Two times a day (BID) | ORAL | 6 refills | Status: DC

## 2019-01-21 NOTE — Addendum Note (Signed)
Addended by: Noberto Retort C on: 01/21/2019 02:42 PM   Modules accepted: Orders

## 2019-01-21 NOTE — Telephone Encounter (Signed)
I called [facility] and was informed that her co-pay for one pen is $80 and three pens $160.  The patient told me she was unable to afford either option.  She is also uncomfortable giving herself injections. States she has never been on preventive medications and would prefer an affordable, oral option.

## 2019-01-21 NOTE — Telephone Encounter (Addendum)
I returned the call to the patient to review Dr. Gladstone Lighter recommendation below.  The patient denies any history of kidney stones.  She was in agreement to try topiramate.  She verbalized understanding of the dosing instructions and will also increase her water intake.  She has a pending follow up on 03/18/2019 with Butler Denmark, NP.  I called [facility] again and the Aimovig prescription has been voided.

## 2019-01-21 NOTE — Telephone Encounter (Signed)
Last Visit: 07/24/2018 Next Visit: 01/22/2019 Labs: 07/24/2018 CBC WNL CMP stable. Uric acid is within desirable range.  Okay to refill per Dr. Estanislado Pandy.

## 2019-01-21 NOTE — Addendum Note (Signed)
Addended by: Andrey Spearman R on: 01/21/2019 01:46 PM   Modules accepted: Orders

## 2019-01-21 NOTE — Telephone Encounter (Signed)
MIGRAINE PREVENTION --> start topiramate 50mg  at bedtime; after 1 week increase to twice a day; drink plenty of water; caution if any history of kidney stones.  Meds ordered this encounter  Medications  . topiramate (TOPAMAX) 50 MG tablet    Sig: Take 1 tablet (50 mg total) by mouth 2 (two) times daily.    Dispense:  60 tablet    Refill:  [place], MD [id number], [id number] PM Certified in Neurology, Neurophysiology and Neuroimaging  [facility] [address] [place], [place] [address] [phone]

## 2019-01-22 ENCOUNTER — Telehealth (INDEPENDENT_AMBULATORY_CARE_PROVIDER_SITE_OTHER): Payer: PPO | Admitting: Rheumatology

## 2019-01-22 ENCOUNTER — Other Ambulatory Visit: Payer: Self-pay

## 2019-01-22 ENCOUNTER — Encounter: Payer: Self-pay | Admitting: Rheumatology

## 2019-01-22 DIAGNOSIS — R5383 Other fatigue: Secondary | ICD-10-CM

## 2019-01-22 DIAGNOSIS — M797 Fibromyalgia: Secondary | ICD-10-CM | POA: Diagnosis not present

## 2019-01-22 DIAGNOSIS — M47816 Spondylosis without myelopathy or radiculopathy, lumbar region: Secondary | ICD-10-CM

## 2019-01-22 DIAGNOSIS — F5101 Primary insomnia: Secondary | ICD-10-CM

## 2019-01-22 DIAGNOSIS — Z8719 Personal history of other diseases of the digestive system: Secondary | ICD-10-CM | POA: Diagnosis not present

## 2019-01-22 DIAGNOSIS — F32A Depression, unspecified: Secondary | ICD-10-CM

## 2019-01-22 DIAGNOSIS — Z8679 Personal history of other diseases of the circulatory system: Secondary | ICD-10-CM | POA: Diagnosis not present

## 2019-01-22 DIAGNOSIS — Z5181 Encounter for therapeutic drug level monitoring: Secondary | ICD-10-CM | POA: Diagnosis not present

## 2019-01-22 DIAGNOSIS — M1A09X0 Idiopathic chronic gout, multiple sites, without tophus (tophi): Secondary | ICD-10-CM | POA: Diagnosis not present

## 2019-01-22 DIAGNOSIS — G894 Chronic pain syndrome: Secondary | ICD-10-CM

## 2019-01-22 DIAGNOSIS — M19041 Primary osteoarthritis, right hand: Secondary | ICD-10-CM | POA: Diagnosis not present

## 2019-01-22 DIAGNOSIS — M8589 Other specified disorders of bone density and structure, multiple sites: Secondary | ICD-10-CM | POA: Diagnosis not present

## 2019-01-22 DIAGNOSIS — F419 Anxiety disorder, unspecified: Secondary | ICD-10-CM | POA: Diagnosis not present

## 2019-01-22 DIAGNOSIS — F329 Major depressive disorder, single episode, unspecified: Secondary | ICD-10-CM

## 2019-01-22 DIAGNOSIS — M19042 Primary osteoarthritis, left hand: Secondary | ICD-10-CM

## 2019-01-25 ENCOUNTER — Encounter (HOSPITAL_COMMUNITY): Payer: Self-pay

## 2019-01-25 ENCOUNTER — Encounter (HOSPITAL_COMMUNITY)
Admission: RE | Admit: 2019-01-25 | Discharge: 2019-01-25 | Disposition: A | Discharge status: Home / Self Care | Payer: PPO | Source: Ambulatory Visit | Attending: Neurological Surgery | Admitting: Neurological Surgery

## 2019-01-25 ENCOUNTER — Other Ambulatory Visit (HOSPITAL_COMMUNITY)
Admission: RE | Admit: 2019-01-25 | Discharge: 2019-01-25 | Disposition: A | Discharge status: Home / Self Care | Payer: PPO | Source: Ambulatory Visit | Attending: Neurological Surgery | Admitting: Neurological Surgery

## 2019-01-25 ENCOUNTER — Ambulatory Visit (HOSPITAL_COMMUNITY)
Admission: RE | Admit: 2019-01-25 | Discharge: 2019-01-25 | Disposition: A | Discharge status: Home / Self Care | Payer: PPO | Source: Ambulatory Visit | Attending: Neurological Surgery | Admitting: Neurological Surgery

## 2019-01-25 ENCOUNTER — Other Ambulatory Visit: Payer: Self-pay

## 2019-01-25 DIAGNOSIS — Z7982 Long term (current) use of aspirin: Secondary | ICD-10-CM | POA: Diagnosis not present

## 2019-01-25 DIAGNOSIS — Z8249 Family history of ischemic heart disease and other diseases of the circulatory system: Secondary | ICD-10-CM | POA: Diagnosis not present

## 2019-01-25 DIAGNOSIS — K219 Gastro-esophageal reflux disease without esophagitis: Secondary | ICD-10-CM | POA: Diagnosis present

## 2019-01-25 DIAGNOSIS — M797 Fibromyalgia: Secondary | ICD-10-CM | POA: Diagnosis present

## 2019-01-25 DIAGNOSIS — Z886 Allergy status to analgesic agent status: Secondary | ICD-10-CM | POA: Diagnosis not present

## 2019-01-25 DIAGNOSIS — Z01818 Encounter for other preprocedural examination: Secondary | ICD-10-CM | POA: Insufficient documentation

## 2019-01-25 DIAGNOSIS — I498 Other specified cardiac arrhythmias: Secondary | ICD-10-CM | POA: Insufficient documentation

## 2019-01-25 DIAGNOSIS — E119 Type 2 diabetes mellitus without complications: Secondary | ICD-10-CM | POA: Diagnosis present

## 2019-01-25 DIAGNOSIS — E7800 Pure hypercholesterolemia, unspecified: Secondary | ICD-10-CM | POA: Diagnosis not present

## 2019-01-25 DIAGNOSIS — Z87828 Personal history of other (healed) physical injury and trauma: Secondary | ICD-10-CM | POA: Diagnosis not present

## 2019-01-25 DIAGNOSIS — M4316 Spondylolisthesis, lumbar region: Secondary | ICD-10-CM | POA: Diagnosis present

## 2019-01-25 DIAGNOSIS — I10 Essential (primary) hypertension: Secondary | ICD-10-CM | POA: Diagnosis present

## 2019-01-25 DIAGNOSIS — M4310 Spondylolisthesis, site unspecified: Secondary | ICD-10-CM

## 2019-01-25 DIAGNOSIS — M47816 Spondylosis without myelopathy or radiculopathy, lumbar region: Secondary | ICD-10-CM | POA: Diagnosis not present

## 2019-01-25 DIAGNOSIS — Z888 Allergy status to other drugs, medicaments and biological substances status: Secondary | ICD-10-CM | POA: Diagnosis not present

## 2019-01-25 DIAGNOSIS — Z981 Arthrodesis status: Secondary | ICD-10-CM | POA: Diagnosis not present

## 2019-01-25 DIAGNOSIS — M48061 Spinal stenosis, lumbar region without neurogenic claudication: Secondary | ICD-10-CM | POA: Diagnosis present

## 2019-01-25 DIAGNOSIS — M109 Gout, unspecified: Secondary | ICD-10-CM | POA: Diagnosis present

## 2019-01-25 DIAGNOSIS — Z20828 Contact with and (suspected) exposure to other viral communicable diseases: Secondary | ICD-10-CM | POA: Diagnosis present

## 2019-01-25 DIAGNOSIS — M1711 Unilateral primary osteoarthritis, right knee: Secondary | ICD-10-CM | POA: Diagnosis not present

## 2019-01-25 DIAGNOSIS — Z79899 Other long term (current) drug therapy: Secondary | ICD-10-CM | POA: Diagnosis not present

## 2019-01-25 DIAGNOSIS — E785 Hyperlipidemia, unspecified: Secondary | ICD-10-CM | POA: Diagnosis not present

## 2019-01-25 LAB — CBC WITH DIFFERENTIAL/PLATELET
Abs Immature Granulocytes: 0.06 10*3/uL (ref 0.00–0.07)
Basophils Absolute: 0 10*3/uL (ref 0.0–0.1)
Basophils Relative: 0 %
Eosinophils Absolute: 0.1 10*3/uL (ref 0.0–0.5)
Eosinophils Relative: 2 %
HCT: 40.2 % (ref 36.0–46.0)
Hemoglobin: 12.5 g/dL (ref 12.0–15.0)
Immature Granulocytes: 1 %
Lymphocytes Relative: 24 %
Lymphs Abs: 1.8 10*3/uL (ref 0.7–4.0)
MCH: 31.3 pg (ref 26.0–34.0)
MCHC: 31.1 g/dL (ref 30.0–36.0)
MCV: 100.5 fL — ABNORMAL HIGH (ref 80.0–100.0)
Monocytes Absolute: 0.4 10*3/uL (ref 0.1–1.0)
Monocytes Relative: 5 %
Neutro Abs: 5.1 10*3/uL (ref 1.7–7.7)
Neutrophils Relative %: 68 %
Platelets: 318 10*3/uL (ref 150–400)
RBC: 4 MIL/uL (ref 3.87–5.11)
RDW: 14.1 % (ref 11.5–15.5)
WBC: 7.5 10*3/uL (ref 4.0–10.5)
nRBC: 0 % (ref 0.0–0.2)

## 2019-01-25 LAB — SURGICAL PCR SCREEN
MRSA, PCR: NEGATIVE
Staphylococcus aureus: NEGATIVE

## 2019-01-25 LAB — TYPE AND SCREEN
ABO/RH(D): A POS
Antibody Screen: NEGATIVE

## 2019-01-25 LAB — BASIC METABOLIC PANEL
Anion gap: 10 (ref 5–15)
BUN: 12 mg/dL (ref 8–23)
CO2: 26 mmol/L (ref 22–32)
Calcium: 10.2 mg/dL (ref 8.9–10.3)
Chloride: 104 mmol/L (ref 98–111)
Creatinine, Ser: 0.93 mg/dL (ref 0.44–1.00)
GFR calc Af Amer: >60 mL/min (ref >60)
GFR calc non Af Amer: >60 mL/min (ref >60)
Glucose, Bld: 132 mg/dL — ABNORMAL HIGH (ref 70–99)
Potassium: 4.2 mmol/L (ref 3.5–5.1)
Sodium: 140 mmol/L (ref 135–145)

## 2019-01-25 LAB — PROTIME-INR
INR: 0.9 (ref 0.8–1.2)
Prothrombin Time: 12.4 seconds (ref 11.4–15.2)

## 2019-01-25 LAB — HEMOGLOBIN A1C
Hgb A1c MFr Bld: 6.3 % — ABNORMAL HIGH (ref 4.8–5.6)
Mean Plasma Glucose: 134.11 mg/dL

## 2019-01-25 LAB — SARS CORONAVIRUS 2 (TAT 6-24 HRS): SARS Coronavirus 2: NEGATIVE

## 2019-01-25 LAB — GLUCOSE, CAPILLARY: Glucose-Capillary: 126 mg/dL — ABNORMAL HIGH (ref 70–99)

## 2019-01-25 NOTE — Pre-Procedure Instructions (Signed)
[facility] [phone] - [place], [place] - [address] [place] [address] Phone: [phone] Fax: [phone]      Your procedure is scheduled on 01-01-19  Report to [facility] Main Entrance "A" at [place].M., and check in at the Admitting office.  Call this number if you have problems the morning of surgery:  [phone]  Call [phone] if you have any questions prior to your surgery date Monday-Friday 8am-4pm    Remember:  Do not eat or drink after midnight the night before your surgery  Take these medicines the morning of surgery with A SIP OF WATER : allopurinol (ZYLOPRIM) amLODipine (NORVASC) dicyclomine (BENTYL)  DULoxetine (CYMBALTA) metoprolol succinate (TOPROL-XL) omeprazole (PRILOSEC) mometasone (NASONEX) as needed fluticasone (FLONASE)as needed pantoprazole (PROTONIX) topiramate (TOPAMAX)   Follow your surgeon's instructions on when to stop Aspirin.  If no instructions were given by your surgeon then you will need to call the office to get those instructions.   7 days prior to surgery STOP taking any Aspirin (unless otherwise instructed by your surgeon), Aleve, Naproxen, Ibuprofen, Motrin, Advil, Goody's, BC's, all herbal medications, fish oil, and all vitamins.   WHAT DO I DO ABOUT MY DIABETES MEDICATION?   HOW TO MANAGE YOUR DIABETES BEFORE AND AFTER SURGERY  Why is it important to control my blood sugar before and after surgery? . Improving blood sugar levels before and after surgery helps healing and can limit problems. . A way of improving blood sugar control is eating a healthy diet by: o  Eating less sugar and carbohydrates o  Increasing activity/exercise o  Talking with your doctor about reaching your blood sugar goals . High blood sugars (greater than 180 mg/dL) can raise your risk of infections and slow your recovery, so you will need to focus on controlling your  diabetes during the weeks before surgery. . Make sure that the doctor who takes care of your diabetes knows about your planned surgery including the date and location.  How do I manage my blood sugar before surgery? . Check your blood sugar at least 4 times a day, starting 2 days before surgery, to make sure that the level is not too high or low. . Check your blood sugar the morning of your surgery when you wake up and every 2 hours until you get to the Short Stay unit. o If your blood sugar is less than 70 mg/dL, you will need to treat for low blood sugar: - Do not take insulin. - Treat a low blood sugar (less than 70 mg/dL) with  cup of clear juice (cranberry or apple), 4 glucose tablets, OR glucose gel. - Recheck blood sugar in 15 minutes after treatment (to make sure it is greater than 70 mg/dL). If your blood sugar is not greater than 70 mg/dL on recheck, call [phone] for further instructions. . Report your blood sugar to the short stay nurse when you get to Short Stay.  . If you are admitted to the hospital after surgery: o Your blood sugar will be checked by the staff and you will probably be given insulin after surgery (instead of oral diabetes medicines) to make sure you have good blood sugar levels. o The goal for blood sugar control after surgery is 80-180 mg/dL.   The Morning of Surgery  Do not wear jewelry, make-up or nail polish.  Do not wear lotions, powders, or perfumes/colognes, or deodorant  Do not shave 48 hours  prior to surgery.  Men may shave face and neck.  Do not bring valuables to the hospital.  [facility] is not responsible for any belongings or valuables.  If you are a smoker, DO NOT Smoke 24 hours prior to surgery  If you wear a CPAP at night please bring your mask, tubing, and machine the morning of surgery   Remember that you must have someone to transport you home after your surgery, and remain with you for 24 hours if you are discharged the same  day.   Please bring cases for contacts, glasses, hearing aids, dentures or bridgework because it cannot be worn into surgery.    Leave your suitcase in the car.  After surgery it may be brought to your room.  For patients admitted to the hospital, discharge time will be determined by your treatment team.  Patients discharged the day of surgery will not be allowed to drive home.    Special instructions:   [facility]- Preparing For Surgery  Before surgery, you can play an important role. Because skin is not sterile, your skin needs to be as free of germs as possible. You can reduce the number of germs on your skin by washing with CHG (chlorahexidine gluconate) Soap before surgery.  CHG is an antiseptic cleaner which kills germs and bonds with the skin to continue killing germs even after washing.    Oral Hygiene is also important to reduce your risk of infection.  Remember - BRUSH YOUR TEETH THE MORNING OF SURGERY WITH YOUR REGULAR TOOTHPASTE  Please do not use if you have an allergy to CHG or antibacterial soaps. If your skin becomes reddened/irritated stop using the CHG.  Do not shave (including legs and underarms) for at least 48 hours prior to first CHG shower. It is OK to shave your face.  Please follow these instructions carefully.   1. Shower the NIGHT BEFORE SURGERY and the MORNING OF SURGERY with CHG Soap.   2. If you chose to wash your hair, wash your hair first as usual with your normal shampoo.  3. After you shampoo, rinse your hair and body thoroughly to remove the shampoo.  4. Use CHG as you would any other liquid soap. You can apply CHG directly to the skin and wash gently with a scrungie or a clean washcloth.   5. Apply the CHG Soap to your body ONLY FROM THE NECK DOWN.  Do not use on open wounds or open sores. Avoid contact with your eyes, ears, mouth and genitals (private parts). Wash Face and genitals (private parts)  with your normal soap.   6. Wash thoroughly,  paying special attention to the area where your surgery will be performed.  7. Thoroughly rinse your body with warm water from the neck down.  8. DO NOT shower/wash with your normal soap after using and rinsing off the CHG Soap.  9. Pat yourself dry with a CLEAN TOWEL.  10. Wear CLEAN PAJAMAS to bed the night before surgery, wear comfortable clothes the morning of surgery  11. Place CLEAN SHEETS on your bed the night of your first shower and DO NOT SLEEP WITH PETS.    Day of Surgery:  Please shower the morning of surgery with the CHG soap Do not apply any deodorants/lotions. Please wear clean clothes to the hospital/surgery center.   Remember to brush your teeth WITH YOUR REGULAR TOOTHPASTE.   Please read over the fact sheets that you were given.

## 2019-01-25 NOTE — Progress Notes (Addendum)
Scheduled for Covid test today No h/o sx concerning for Covid. No h/o recent travel. No h/o exposure to Covid positive patients.  PCP - Dr Maury Dus  Cardiologist - denies  Rheumatology-Dr Estanislado Pandy, [facility] [place]  Psychiatrist- Nada Libman, [place]  Chest x-ray - Today  EKG - Today  Stress Test - denies  ECHO - denies  Cardiac Cath - denies  AICD-denies PM-denies LOOP-denies  Sleep Study - denies CPAP - NA  LABS-PCR,A1C  ASA- Stopped 7 days prior  ERAS-NA  HA1C- 6.3(12/03/16) Pre-diabetic Fasting Blood Sugar -  Checks Blood Sugar ___2__ times a week  Anesthesia-N  Pt denies having chest pain, sob, or fever at this time. All instructions explained to the pt, with a verbal understanding of the material. Pt agrees to go over the instructions while at home for a better understanding. Pt also instructed to self quarantine after being tested for COVID-19. The opportunity to ask questions was provided.

## 2019-01-28 ENCOUNTER — Encounter (HOSPITAL_COMMUNITY): Payer: Self-pay | Admitting: *Deleted

## 2019-01-28 ENCOUNTER — Other Ambulatory Visit: Payer: Self-pay

## 2019-01-28 ENCOUNTER — Inpatient Hospital Stay (HOSPITAL_COMMUNITY): Payer: PPO | Admitting: Certified Registered Nurse Anesthetist

## 2019-01-28 ENCOUNTER — Encounter (HOSPITAL_COMMUNITY)
Admission: RE | Disposition: A | Discharge status: Home / Self Care | Payer: Self-pay | Source: Home / Self Care | Attending: Neurological Surgery

## 2019-01-28 ENCOUNTER — Inpatient Hospital Stay (HOSPITAL_COMMUNITY): Payer: PPO

## 2019-01-28 ENCOUNTER — Inpatient Hospital Stay (HOSPITAL_COMMUNITY)
Admission: RE | Admit: 2019-01-28 | Discharge: 2019-01-30 | DRG: 455 | Disposition: A | Discharge status: Home Health | Payer: PPO | Attending: Neurological Surgery | Admitting: Neurological Surgery

## 2019-01-28 DIAGNOSIS — M109 Gout, unspecified: Secondary | ICD-10-CM | POA: Diagnosis present

## 2019-01-28 DIAGNOSIS — I10 Essential (primary) hypertension: Secondary | ICD-10-CM | POA: Diagnosis present

## 2019-01-28 DIAGNOSIS — M48061 Spinal stenosis, lumbar region without neurogenic claudication: Secondary | ICD-10-CM | POA: Diagnosis present

## 2019-01-28 DIAGNOSIS — M4316 Spondylolisthesis, lumbar region: Secondary | ICD-10-CM | POA: Diagnosis present

## 2019-01-28 DIAGNOSIS — Z7982 Long term (current) use of aspirin: Secondary | ICD-10-CM | POA: Diagnosis not present

## 2019-01-28 DIAGNOSIS — Z981 Arthrodesis status: Secondary | ICD-10-CM

## 2019-01-28 DIAGNOSIS — Z886 Allergy status to analgesic agent status: Secondary | ICD-10-CM | POA: Diagnosis not present

## 2019-01-28 DIAGNOSIS — Z20828 Contact with and (suspected) exposure to other viral communicable diseases: Secondary | ICD-10-CM | POA: Diagnosis present

## 2019-01-28 DIAGNOSIS — Z419 Encounter for procedure for purposes other than remedying health state, unspecified: Secondary | ICD-10-CM

## 2019-01-28 DIAGNOSIS — M797 Fibromyalgia: Secondary | ICD-10-CM | POA: Diagnosis present

## 2019-01-28 DIAGNOSIS — Z79899 Other long term (current) drug therapy: Secondary | ICD-10-CM

## 2019-01-28 DIAGNOSIS — K219 Gastro-esophageal reflux disease without esophagitis: Secondary | ICD-10-CM | POA: Diagnosis present

## 2019-01-28 DIAGNOSIS — Z8249 Family history of ischemic heart disease and other diseases of the circulatory system: Secondary | ICD-10-CM

## 2019-01-28 DIAGNOSIS — Z888 Allergy status to other drugs, medicaments and biological substances status: Secondary | ICD-10-CM

## 2019-01-28 DIAGNOSIS — E119 Type 2 diabetes mellitus without complications: Secondary | ICD-10-CM | POA: Diagnosis present

## 2019-01-28 HISTORY — PX: TRANSFORAMINAL LUMBAR INTERBODY FUSION (TLIF) WITH PEDICLE SCREW FIXATION 2 LEVEL: SHX6142

## 2019-01-28 LAB — GLUCOSE, CAPILLARY
Glucose-Capillary: 129 mg/dL — ABNORMAL HIGH (ref 70–99)
Glucose-Capillary: 136 mg/dL — ABNORMAL HIGH (ref 70–99)
Glucose-Capillary: 177 mg/dL — ABNORMAL HIGH (ref 70–99)
Glucose-Capillary: 170 mg/dL — ABNORMAL HIGH (ref 70–99)

## 2019-01-28 SURGERY — TRANSFORAMINAL LUMBAR INTERBODY FUSION (TLIF) WITH PEDICLE SCREW FIXATION 2 LEVEL
Anesthesia: General | Site: Spine Lumbar

## 2019-01-28 MED ORDER — MENTHOL 3 MG MT LOZG: 1.0000 | LOZENGE | OROMUCOSAL | Status: DC | PRN

## 2019-01-28 MED ORDER — CEFAZOLIN SODIUM-DEXTROSE 2-4 GM/100ML-% IV SOLN
2.0000 g | INTRAVENOUS | Status: AC
  Administered 2019-01-28: 2 g via INTRAVENOUS
  Filled 2019-01-28: qty 100

## 2019-01-28 MED ORDER — POTASSIUM CHLORIDE IN NACL 20-0.9 MEQ/L-% IV SOLN: INTRAVENOUS | Status: DC

## 2019-01-28 MED ORDER — CHLORHEXIDINE GLUCONATE CLOTH 2 % EX PADS: 6.0000 | MEDICATED_PAD | Freq: Once | CUTANEOUS | Status: DC

## 2019-01-28 MED ORDER — MIDAZOLAM HCL 5 MG/5ML IJ SOLN
INTRAMUSCULAR | Status: DC | PRN
  Administered 2019-01-28: 1 mg via INTRAVENOUS

## 2019-01-28 MED ORDER — ONDANSETRON HCL 4 MG/2ML IJ SOLN
INTRAMUSCULAR | Status: DC | PRN
  Administered 2019-01-28: 4 mg via INTRAVENOUS

## 2019-01-28 MED ORDER — SODIUM CHLORIDE 0.9% FLUSH
3.0000 mL | Freq: Two times a day (BID) | INTRAVENOUS | Status: DC
  Administered 2019-01-28: 22:00:00 3 mL via INTRAVENOUS

## 2019-01-28 MED ORDER — OXYCODONE HCL 5 MG/5ML PO SOLN: 5.0000 mg | Freq: Once | ORAL | Status: AC | PRN

## 2019-01-28 MED ORDER — OXYCODONE HCL 5 MG PO TABS
ORAL_TABLET | ORAL | Status: AC
  Filled 2019-01-28: qty 1

## 2019-01-28 MED ORDER — LOSARTAN POTASSIUM 50 MG PO TABS
100.0000 mg | ORAL_TABLET | Freq: Every day | ORAL | Status: DC
  Administered 2019-01-28 – 2019-01-29 (×2): 100 mg via ORAL
  Filled 2019-01-28 (×2): qty 2

## 2019-01-28 MED ORDER — ONDANSETRON HCL 4 MG/2ML IJ SOLN
INTRAMUSCULAR | Status: AC
  Filled 2019-01-28: qty 2

## 2019-01-28 MED ORDER — OXYCODONE-ACETAMINOPHEN 5-325 MG PO TABS
1.0000 | ORAL_TABLET | ORAL | Status: DC | PRN
  Administered 2019-01-28 – 2019-01-30 (×10): 2 via ORAL
  Filled 2019-01-28 (×10): qty 2

## 2019-01-28 MED ORDER — EPHEDRINE 5 MG/ML INJ
INTRAVENOUS | Status: AC
  Filled 2019-01-28: qty 10

## 2019-01-28 MED ORDER — METHOCARBAMOL 1000 MG/10ML IJ SOLN
500.0000 mg | Freq: Four times a day (QID) | INTRAVENOUS | Status: DC | PRN
  Filled 2019-01-28: qty 5

## 2019-01-28 MED ORDER — MORPHINE SULFATE (PF) 2 MG/ML IV SOLN
2.0000 mg | INTRAVENOUS | Status: DC | PRN
  Administered 2019-01-28: 16:00:00 2 mg via INTRAVENOUS
  Filled 2019-01-28: qty 1

## 2019-01-28 MED ORDER — ONDANSETRON HCL 4 MG/2ML IJ SOLN: 4.0000 mg | Freq: Four times a day (QID) | INTRAMUSCULAR | Status: DC | PRN

## 2019-01-28 MED ORDER — PROMETHAZINE HCL 25 MG/ML IJ SOLN: 6.2500 mg | INTRAMUSCULAR | Status: DC | PRN

## 2019-01-28 MED ORDER — LACTATED RINGERS IV SOLN
INTRAVENOUS | Status: DC
  Administered 2019-01-28 (×2): via INTRAVENOUS

## 2019-01-28 MED ORDER — SODIUM CHLORIDE 0.9 % IV SOLN: 250.0000 mL | INTRAVENOUS | Status: DC

## 2019-01-28 MED ORDER — HYDROMORPHONE HCL 1 MG/ML IJ SOLN: 0.2500 mg | INTRAMUSCULAR | Status: DC | PRN

## 2019-01-28 MED ORDER — CEFAZOLIN SODIUM-DEXTROSE 2-4 GM/100ML-% IV SOLN
2.0000 g | Freq: Three times a day (TID) | INTRAVENOUS | Status: AC
  Administered 2019-01-28 – 2019-01-29 (×2): 2 g via INTRAVENOUS
  Filled 2019-01-28 (×2): qty 100

## 2019-01-28 MED ORDER — ACETAMINOPHEN 325 MG PO TABS: 650.0000 mg | ORAL_TABLET | ORAL | Status: DC | PRN

## 2019-01-28 MED ORDER — METHOCARBAMOL 500 MG PO TABS
500.0000 mg | ORAL_TABLET | Freq: Four times a day (QID) | ORAL | Status: DC | PRN
  Administered 2019-01-28 – 2019-01-30 (×4): 500 mg via ORAL
  Filled 2019-01-28 (×3): qty 1

## 2019-01-28 MED ORDER — RALOXIFENE HCL 60 MG PO TABS
60.0000 mg | ORAL_TABLET | Freq: Every day | ORAL | Status: DC
  Administered 2019-01-28 – 2019-01-29 (×2): 60 mg via ORAL
  Filled 2019-01-28 (×3): qty 1

## 2019-01-28 MED ORDER — BUPIVACAINE HCL (PF) 0.25 % IJ SOLN
INTRAMUSCULAR | Status: AC
  Filled 2019-01-28: qty 30

## 2019-01-28 MED ORDER — GABAPENTIN 600 MG PO TABS
600.0000 mg | ORAL_TABLET | Freq: Every day | ORAL | Status: DC
  Administered 2019-01-28 – 2019-01-29 (×2): 600 mg via ORAL
  Filled 2019-01-28 (×2): qty 1

## 2019-01-28 MED ORDER — 0.9 % SODIUM CHLORIDE (POUR BTL) OPTIME
TOPICAL | Status: DC | PRN
  Administered 2019-01-28: 1000 mL

## 2019-01-28 MED ORDER — PROPOFOL 10 MG/ML IV BOLUS
INTRAVENOUS | Status: DC | PRN
  Administered 2019-01-28: 160 mg via INTRAVENOUS

## 2019-01-28 MED ORDER — LIDOCAINE 2% (20 MG/ML) 5 ML SYRINGE
INTRAMUSCULAR | Status: AC
  Filled 2019-01-28: qty 5

## 2019-01-28 MED ORDER — ROCURONIUM BROMIDE 10 MG/ML (PF) SYRINGE
PREFILLED_SYRINGE | INTRAVENOUS | Status: AC
  Filled 2019-01-28: qty 10

## 2019-01-28 MED ORDER — SODIUM CHLORIDE 0.9% FLUSH: 3.0000 mL | INTRAVENOUS | Status: DC | PRN

## 2019-01-28 MED ORDER — LIDOCAINE 2% (20 MG/ML) 5 ML SYRINGE
INTRAMUSCULAR | Status: DC | PRN
  Administered 2019-01-28: 80 mg via INTRAVENOUS

## 2019-01-28 MED ORDER — PHENYLEPHRINE 40 MCG/ML (10ML) SYRINGE FOR IV PUSH (FOR BLOOD PRESSURE SUPPORT)
PREFILLED_SYRINGE | INTRAVENOUS | Status: AC
  Filled 2019-01-28: qty 10

## 2019-01-28 MED ORDER — EPHEDRINE SULFATE-NACL 50-0.9 MG/10ML-% IV SOSY
PREFILLED_SYRINGE | INTRAVENOUS | Status: DC | PRN
  Administered 2019-01-28 (×2): 5 mg via INTRAVENOUS
  Administered 2019-01-28: 10 mg via INTRAVENOUS
  Administered 2019-01-28: 5 mg via INTRAVENOUS

## 2019-01-28 MED ORDER — DEXAMETHASONE SODIUM PHOSPHATE 10 MG/ML IJ SOLN
INTRAMUSCULAR | Status: AC
  Filled 2019-01-28: qty 1

## 2019-01-28 MED ORDER — ALLOPURINOL 100 MG PO TABS
100.0000 mg | ORAL_TABLET | Freq: Every day | ORAL | Status: DC
  Administered 2019-01-28 – 2019-01-29 (×2): 100 mg via ORAL
  Filled 2019-01-28 (×3): qty 1

## 2019-01-28 MED ORDER — PROPOFOL 10 MG/ML IV BOLUS
INTRAVENOUS | Status: AC
  Filled 2019-01-28: qty 40

## 2019-01-28 MED ORDER — TOPIRAMATE 25 MG PO TABS
50.0000 mg | ORAL_TABLET | Freq: Two times a day (BID) | ORAL | Status: DC
  Administered 2019-01-28 – 2019-01-29 (×3): 50 mg via ORAL
  Filled 2019-01-28 (×3): qty 2

## 2019-01-28 MED ORDER — DULOXETINE HCL 30 MG PO CPEP
120.0000 mg | ORAL_CAPSULE | Freq: Every day | ORAL | Status: DC
  Administered 2019-01-29: 120 mg via ORAL
  Filled 2019-01-28: qty 4

## 2019-01-28 MED ORDER — ROCURONIUM BROMIDE 10 MG/ML (PF) SYRINGE
PREFILLED_SYRINGE | INTRAVENOUS | Status: DC | PRN
  Administered 2019-01-28: 80 mg via INTRAVENOUS

## 2019-01-28 MED ORDER — HYDROCHLOROTHIAZIDE 25 MG PO TABS
25.0000 mg | ORAL_TABLET | Freq: Every day | ORAL | Status: DC
  Administered 2019-01-29: 25 mg via ORAL
  Filled 2019-01-28: qty 1

## 2019-01-28 MED ORDER — DEXAMETHASONE SODIUM PHOSPHATE 10 MG/ML IJ SOLN
10.0000 mg | Freq: Once | INTRAMUSCULAR | Status: AC
  Administered 2019-01-28: 10 mg via INTRAVENOUS
  Filled 2019-01-28: qty 1

## 2019-01-28 MED ORDER — METHOCARBAMOL 500 MG PO TABS
ORAL_TABLET | ORAL | Status: AC
  Filled 2019-01-28: qty 1

## 2019-01-28 MED ORDER — THROMBIN 20000 UNITS EX SOLR
CUTANEOUS | Status: DC | PRN
  Administered 2019-01-28: 20 mL via TOPICAL

## 2019-01-28 MED ORDER — AMLODIPINE BESYLATE 5 MG PO TABS
5.0000 mg | ORAL_TABLET | Freq: Every day | ORAL | Status: DC
  Administered 2019-01-28 – 2019-01-29 (×2): 5 mg via ORAL
  Filled 2019-01-28 (×2): qty 1

## 2019-01-28 MED ORDER — PHENYLEPHRINE HCL-NACL 10-0.9 MG/250ML-% IV SOLN
INTRAVENOUS | Status: DC | PRN
  Administered 2019-01-28: 25 ug/min via INTRAVENOUS

## 2019-01-28 MED ORDER — HYDROCODONE-ACETAMINOPHEN 10-325 MG PO TABS: 1.0000 | ORAL_TABLET | ORAL | Status: DC | PRN

## 2019-01-28 MED ORDER — PHENYLEPHRINE 40 MCG/ML (10ML) SYRINGE FOR IV PUSH (FOR BLOOD PRESSURE SUPPORT)
PREFILLED_SYRINGE | INTRAVENOUS | Status: DC | PRN
  Administered 2019-01-28 (×3): 80 ug via INTRAVENOUS

## 2019-01-28 MED ORDER — PHENOL 1.4 % MT LIQD: 1.0000 | OROMUCOSAL | Status: DC | PRN

## 2019-01-28 MED ORDER — INSULIN ASPART 100 UNIT/ML ~~LOC~~ SOLN
0.0000 [IU] | Freq: Three times a day (TID) | SUBCUTANEOUS | Status: DC
  Administered 2019-01-28: 16:00:00 3 [IU] via SUBCUTANEOUS
  Administered 2019-01-29: 2 [IU] via SUBCUTANEOUS
  Administered 2019-01-30: 3 [IU] via SUBCUTANEOUS

## 2019-01-28 MED ORDER — SODIUM CHLORIDE 0.9 % IV SOLN
INTRAVENOUS | Status: DC | PRN
  Administered 2019-01-28: 500 mL

## 2019-01-28 MED ORDER — THROMBIN 20000 UNITS EX SOLR
CUTANEOUS | Status: AC
  Filled 2019-01-28: qty 20000

## 2019-01-28 MED ORDER — METOPROLOL SUCCINATE ER 100 MG PO TB24
100.0000 mg | ORAL_TABLET | Freq: Every day | ORAL | Status: DC
  Administered 2019-01-29: 100 mg via ORAL
  Filled 2019-01-28: qty 1

## 2019-01-28 MED ORDER — MIDAZOLAM HCL 2 MG/2ML IJ SOLN
INTRAMUSCULAR | Status: AC
  Filled 2019-01-28: qty 2

## 2019-01-28 MED ORDER — THROMBIN 5000 UNITS EX SOLR
CUTANEOUS | Status: AC
  Filled 2019-01-28: qty 5000

## 2019-01-28 MED ORDER — INSULIN ASPART 100 UNIT/ML ~~LOC~~ SOLN: 0.0000 [IU] | Freq: Every day | SUBCUTANEOUS | Status: DC

## 2019-01-28 MED ORDER — PANTOPRAZOLE SODIUM 40 MG PO TBEC
40.0000 mg | DELAYED_RELEASE_TABLET | Freq: Every day | ORAL | Status: DC
  Administered 2019-01-29: 40 mg via ORAL
  Filled 2019-01-28: qty 1

## 2019-01-28 MED ORDER — SUGAMMADEX SODIUM 200 MG/2ML IV SOLN
INTRAVENOUS | Status: DC | PRN
  Administered 2019-01-28: 200 mg via INTRAVENOUS

## 2019-01-28 MED ORDER — SENNA 8.6 MG PO TABS
1.0000 | ORAL_TABLET | Freq: Two times a day (BID) | ORAL | Status: DC
  Administered 2019-01-28 – 2019-01-29 (×3): 8.6 mg via ORAL
  Filled 2019-01-28 (×3): qty 1

## 2019-01-28 MED ORDER — ONDANSETRON HCL 4 MG PO TABS: 4.0000 mg | ORAL_TABLET | Freq: Four times a day (QID) | ORAL | Status: DC | PRN

## 2019-01-28 MED ORDER — OXYCODONE HCL 5 MG PO TABS
5.0000 mg | ORAL_TABLET | Freq: Once | ORAL | Status: AC | PRN
  Administered 2019-01-28: 14:00:00 5 mg via ORAL

## 2019-01-28 MED ORDER — MEPERIDINE HCL 25 MG/ML IJ SOLN: 6.2500 mg | INTRAMUSCULAR | Status: DC | PRN

## 2019-01-28 MED ORDER — ASPIRIN EC 81 MG PO TBEC
81.0000 mg | DELAYED_RELEASE_TABLET | Freq: Every day | ORAL | Status: DC
  Administered 2019-01-29: 81 mg via ORAL
  Filled 2019-01-28: qty 1

## 2019-01-28 MED ORDER — FENTANYL CITRATE (PF) 250 MCG/5ML IJ SOLN
INTRAMUSCULAR | Status: DC | PRN
  Administered 2019-01-28 (×5): 50 ug via INTRAVENOUS

## 2019-01-28 MED ORDER — FENTANYL CITRATE (PF) 250 MCG/5ML IJ SOLN
INTRAMUSCULAR | Status: AC
  Filled 2019-01-28: qty 5

## 2019-01-28 MED ORDER — ACETAMINOPHEN 650 MG RE SUPP: 650.0000 mg | RECTAL | Status: DC | PRN

## 2019-01-28 MED ORDER — THROMBIN 5000 UNITS EX SOLR
OROMUCOSAL | Status: DC | PRN
  Administered 2019-01-28: 5 mL via TOPICAL

## 2019-01-28 MED ORDER — BUPIVACAINE HCL (PF) 0.25 % IJ SOLN
INTRAMUSCULAR | Status: DC | PRN
  Administered 2019-01-28: 8 mL

## 2019-01-28 SURGICAL SUPPLY — 73 items
ADH SKN CLS APL DERMABOND .7 (GAUZE/BANDAGES/DRESSINGS) ×1
APL SKNCLS STERI-STRIP NONHPOA (GAUZE/BANDAGES/DRESSINGS) ×1
BAG DECANTER FOR FLEXI CONT (MISCELLANEOUS) ×2 IMPLANT
BASKET BONE COLLECTION (BASKET) ×2 IMPLANT
BENZOIN TINCTURE PRP APPL 2/3 (GAUZE/BANDAGES/DRESSINGS) ×2 IMPLANT
BLADE CLIPPER SURG (BLADE) IMPLANT
BONE CANC CHIPS 20CC PCAN1/4 (Bone Implant) ×2 IMPLANT
BUR MATCHSTICK NEURO 3.0 LAGG (BURR) ×2 IMPLANT
CANISTER SUCT 3000ML PPV (MISCELLANEOUS) ×2 IMPLANT
CARTRIDGE OIL MAESTRO DRILL (MISCELLANEOUS) ×1 IMPLANT
CHIPS CANC BONE 20CC PCAN1/4 (Bone Implant) ×1 IMPLANT
CLIP SPRING STIM LLIF SAFEOP (CLIP) ×1 IMPLANT
CONT SPEC 4OZ CLIKSEAL STRL BL (MISCELLANEOUS) ×2 IMPLANT
COVER BACK TABLE 24X17X13 BIG (DRAPES) IMPLANT
COVER BACK TABLE 60X90IN (DRAPES) ×2 IMPLANT
COVER WAND RF STERILE (DRAPES) ×1 IMPLANT
DERMABOND ADVANCED (GAUZE/BANDAGES/DRESSINGS) ×1
DERMABOND ADVANCED .7 DNX12 (GAUZE/BANDAGES/DRESSINGS) IMPLANT
DIFFUSER DRILL AIR PNEUMATIC (MISCELLANEOUS) ×2 IMPLANT
DRAPE C-ARM 42X72 X-RAY (DRAPES) ×3 IMPLANT
DRAPE C-ARMOR (DRAPES) ×1 IMPLANT
DRAPE LAPAROTOMY 100X72X124 (DRAPES) ×2 IMPLANT
DRAPE SURG 17X23 STRL (DRAPES) ×2 IMPLANT
DRSG OPSITE POSTOP 4X6 (GAUZE/BANDAGES/DRESSINGS) ×1 IMPLANT
DURAPREP 26ML APPLICATOR (WOUND CARE) ×2 IMPLANT
ELECT KIT SAFEOP EMG/NMJ (ELECTRODE) ×2
ELECT REM PT RETURN 9FT ADLT (ELECTROSURGICAL) ×2
ELECTRODE REM PT RTRN 9FT ADLT (ELECTROSURGICAL) ×1 IMPLANT
EVACUATOR 1/8 PVC DRAIN (DRAIN) ×1 IMPLANT
GAUZE 4X4 16PLY RFD (DISPOSABLE) IMPLANT
GLOVE BIO SURGEON STRL SZ 6.5 (GLOVE) ×4 IMPLANT
GLOVE BIO SURGEON STRL SZ7 (GLOVE) ×1 IMPLANT
GLOVE BIO SURGEON STRL SZ8 (GLOVE) ×3 IMPLANT
GLOVE BIOGEL PI IND STRL 6.5 (GLOVE) IMPLANT
GLOVE BIOGEL PI IND STRL 7.0 (GLOVE) IMPLANT
GLOVE BIOGEL PI INDICATOR 6.5 (GLOVE) ×3
GLOVE BIOGEL PI INDICATOR 7.0 (GLOVE) ×1
GOWN STRL REUS W/ TWL LRG LVL3 (GOWN DISPOSABLE) IMPLANT
GOWN STRL REUS W/ TWL XL LVL3 (GOWN DISPOSABLE) ×2 IMPLANT
GOWN STRL REUS W/TWL 2XL LVL3 (GOWN DISPOSABLE) IMPLANT
GOWN STRL REUS W/TWL LRG LVL3 (GOWN DISPOSABLE) ×6
GOWN STRL REUS W/TWL XL LVL3 (GOWN DISPOSABLE) ×2
GRAFT BNE CANC CHIPS 1-8 20CC (Bone Implant) IMPLANT
HEMOSTAT POWDER KIT SURGIFOAM (HEMOSTASIS) ×1 IMPLANT
KIT BASIN OR (CUSTOM PROCEDURE TRAY) ×2 IMPLANT
KIT EMG SRFC ELECT SAFEOP (ELECTRODE) IMPLANT
KIT TURNOVER KIT B (KITS) ×2 IMPLANT
NDL HYPO 25X1 1.5 SAFETY (NEEDLE) ×1 IMPLANT
NEEDLE HYPO 25X1 1.5 SAFETY (NEEDLE) ×2 IMPLANT
NS IRRIG 1000ML POUR BTL (IV SOLUTION) ×2 IMPLANT
OIL CARTRIDGE MAESTRO DRILL (MISCELLANEOUS) ×2
PACK LAMINECTOMY NEURO (CUSTOM PROCEDURE TRAY) ×2 IMPLANT
PAD ARMBOARD 7.5X6 YLW CONV (MISCELLANEOUS) ×8 IMPLANT
PUTTY DBM 10CC ×1 IMPLANT
ROD LORD LIPPED TI 5.5X70 (Rod) ×2 IMPLANT
SCREW CANC SHANK MOD 6.5X35 (Screw) ×2 IMPLANT
SCREW CORT SHANK MOD 6.5X40 (Screw) ×4 IMPLANT
SCREW POLYAXIAL TULIP (Screw) ×6 IMPLANT
SET SCREW (Screw) ×12 IMPLANT
SET SCREW SPNE (Screw) IMPLANT
SPACER IDENTITI PS 7X10X25 5D (Spacer) ×1 IMPLANT
SPACER IDENTITI PS 8X10X25 10D (Spacer) ×1 IMPLANT
SPONGE LAP 4X18 RFD (DISPOSABLE) IMPLANT
SPONGE SURGIFOAM ABS GEL 100 (HEMOSTASIS) ×2 IMPLANT
STRIP CLOSURE SKIN 1/2X4 (GAUZE/BANDAGES/DRESSINGS) ×4 IMPLANT
SUT VIC AB 0 CT1 18XCR BRD8 (SUTURE) ×1 IMPLANT
SUT VIC AB 0 CT1 8-18 (SUTURE) ×4
SUT VIC AB 2-0 CP2 18 (SUTURE) ×2 IMPLANT
SUT VIC AB 3-0 SH 8-18 (SUTURE) ×4 IMPLANT
TOWEL GREEN STERILE (TOWEL DISPOSABLE) ×2 IMPLANT
TOWEL GREEN STERILE FF (TOWEL DISPOSABLE) ×2 IMPLANT
TRAY FOLEY MTR SLVR 16FR STAT (SET/KITS/TRAYS/PACK) ×2 IMPLANT
WATER STERILE IRR 1000ML POUR (IV SOLUTION) ×2 IMPLANT

## 2019-01-28 NOTE — Anesthesia Procedure Notes (Signed)
Procedure Name: Intubation Date/Time: 01/28/2019 9:00 AM Performed by: Colin Benton, CRNA Pre-anesthesia Checklist: Patient identified, Emergency Drugs available, Suction available and Patient being monitored Patient Re-evaluated:Patient Re-evaluated prior to induction Oxygen Delivery Method: Circle system utilized Preoxygenation: Pre-oxygenation with 100% oxygen Induction Type: IV induction Ventilation: Mask ventilation without difficulty Laryngoscope Size: Miller and 2 Grade View: Grade II Tube type: Oral Tube size: 7.0 mm Number of attempts: 1 Airway Equipment and Method: Stylet Placement Confirmation: ETT inserted through vocal cords under direct vision,  positive ETCO2 and breath sounds checked- equal and bilateral Secured at: 22 cm Tube secured with: Tape Dental Injury: Teeth and Oropharynx as per pre-operative assessment

## 2019-01-28 NOTE — Transfer of Care (Signed)
Immediate Anesthesia Transfer of Care Note  Patient: Tammy Mullins  Procedure(s) Performed: TRANSFORAMINAL LUMBAR INTERBODY FUSION LUMBAR THREE-FOUR, LUMBAR FOUR-FIVE (N/A Spine Lumbar)  Patient Location: PACU  Anesthesia Type:General  Level of Consciousness: drowsy and patient cooperative  Airway & Oxygen Therapy: Patient Spontanous Breathing and Patient connected to nasal cannula oxygen  Post-op Assessment: Report given to RN and Post -op Vital signs reviewed and stable  Post vital signs: Reviewed and stable  Last Vitals:  Vitals Value Taken Time  BP 149/82 01/28/19 1228  Temp 36.9 C 01/28/19 1228  Pulse 103 01/28/19 1231  Resp 12 01/28/19 1231  SpO2 99 % 01/28/19 1231  Vitals shown include unvalidated device data.  Last Pain:  Vitals:   01/28/19 0736  PainSc: 5          Complications: No apparent anesthesia complications

## 2019-01-28 NOTE — Evaluation (Signed)
Occupational Therapy Evaluation Patient Details Name: Tammy Mullins MRN: [id number] DOB: May 14, 1950 Today's Date: 01/28/2019    History of Present Illness 68 yo Ms. Wallman admitted for transforaminal lumbar interbody fusion L3 4 L4-5. PHMx: DM2, chronic pain, fibromylgia, depression, gout, shoulder arthoplasty   Clinical Impression   This 68 yo female admitted with above presents to acute OT with decreased balance, increased pain, back precautions, and decreased mobility all affecting her PLOF of being totally independent with basic and IADLs. Currently she is setup/S for UB ADLs, mod-max with LBADLs and min guard A -min A with mobility. She will benefit from acute OT without need for follow up.    Follow Up Recommendations  No OT follow up;Supervision/Assistance - 24 hour    Equipment Recommendations  None recommended by OT       Precautions / Restrictions Precautions Precautions: Back Precaution Booklet Issued: Yes (comment) Required Braces or Orthoses: Spinal Brace Spinal Brace: Lumbar corset;Applied in sitting position;Applied in standing position Restrictions Weight Bearing Restrictions: No      Mobility Bed Mobility Overal bed mobility: Needs Assistance Bed Mobility: Rolling;Sidelying to Sit;Sit to Sidelying Rolling: Min guard Sidelying to sit: Min guard     Sit to sidelying: Min assist General bed mobility comments: VCs for technique  Transfers Overall transfer level: Needs assistance Equipment used: None Transfers: Sit to/from Stand Sit to Stand: Min assist         General transfer comment: increased time    Balance Overall balance assessment: Needs assistance Sitting-balance support: No upper extremity supported;Feet supported Sitting balance-Leahy Scale: Fair     Standing balance support: No upper extremity supported Standing balance-Leahy Scale: Fair Standing balance comment: a bit tentative when up on feet                            ADL either performed or assessed with clinical judgement   ADL Overall ADL's : Needs assistance/impaired Eating/Feeding: Independent;Sitting   Grooming: Set up;Sitting Grooming Details (indicate cue type and reason): We discussed the use of two cups for brushing teeth when standing Upper Body Bathing: Set up;Sitting   Lower Body Bathing: Moderate assistance Lower Body Bathing Details (indicate cue type and reason): min A sit<>stand with increased time from bed Upper Body Dressing : Minimal assistance Upper Body Dressing Details (indicate cue type and reason): for brace Lower Body Dressing: Maximal assistance Lower Body Dressing Details (indicate cue type and reason): min A sit<>stand with increased time from bed Toilet Transfer: Minimal assistance;Ambulation Toilet Transfer Details (indicate cue type and reason): from bed>door>bed Toileting- Clothing Manipulation and Hygiene: Moderate assistance Toileting - Clothing Manipulation Details (indicate cue type and reason): min A sit<>stand with increased time; we discussed the use of wet wipes       General ADL Comments: Reports family can A her with LBADLs until she can do them by herself     Vision Baseline Vision/History: Wears glasses Wears Glasses: Reading only Patient Visual Report: No change from baseline              Pertinent Vitals/Pain Pain Assessment: 0-10 Pain Score: 7  Pain Location: incisional Pain Descriptors / Indicators: Aching;Operative site guarding;Sore Pain Intervention(s): Limited activity within patient's tolerance;Monitored during session;Repositioned     Hand Dominance Right   Extremity/Trunk Assessment Upper Extremity Assessment Upper Extremity Assessment: Overall WFL for tasks assessed           Communication Communication Communication: No difficulties   Cognition  Arousal/Alertness: Lethargic;Suspect due to medications Behavior During Therapy: [facility] for tasks assessed/performed Overall  Cognitive Status: Within Functional Limits for tasks assessed                                                Home Living Family/patient expects to be discharged to:: Private residence Living Arrangements: Alone Available Help at Discharge: Family;Available 24 hours/day Type of Home: House Home Access: (one step up on porch and then threshold step)     Home Layout: One level     Bathroom Shower/Tub: Teacher, early years/pre: Handicapped height     Home Equipment: Shower seat;Grab bars - toilet;Grab bars - tub/shower;Hand held shower head;Cane - single point          Prior Functioning/Environment Level of Independence: Independent                 OT Problem List: Decreased range of motion;Impaired balance (sitting and/or standing);Decreased safety awareness;Decreased knowledge of use of DME or AE;Decreased knowledge of precautions;Pain      OT Treatment/Interventions: Self-care/ADL training;Patient/family education;Balance training    OT Goals(Current goals can be found in the care plan section) Acute Rehab OT Goals Patient Stated Goal: to be able to do for myself again OT Goal Formulation: With patient Time For Goal Achievement: 02/11/19  OT Frequency: Min 2X/week              AM-PAC OT "6 Clicks" Daily Activity     Outcome Measure Help from another person eating meals?: None Help from another person taking care of personal grooming?: A Little Help from another person toileting, which includes using toliet, bedpan, or urinal?: A Little Help from another person bathing (including washing, rinsing, drying)?: A Lot Help from another person to put on and taking off regular upper body clothing?: A Little Help from another person to put on and taking off regular lower body clothing?: A Lot 6 Click Score: 17   End of Session Equipment Utilized During Treatment: Gait belt;Back brace Nurse Communication: Mobility status(pt reports 7/10  pain)  Activity Tolerance: Patient tolerated treatment well Patient left: in bed;with call bell/phone within reach  OT Visit Diagnosis: Unsteadiness on feet (R26.81);Other abnormalities of gait and mobility (R26.89);Muscle weakness (generalized) (M62.81);Pain Pain - part of body: (back incisional)                Time: [id number] OT Time Calculation (min): 26 min Charges:  OT General Charges $OT Visit: 1 Visit OT Evaluation $OT Eval Moderate Complexity: 1 Mod OT Treatments $Self Care/Home Management : 8-22 mins  Golden Circle, OTR/L Acute [employer] Pager [phone] Office [phone]     Almon Register 01/28/2019, 3:01 PM

## 2019-01-28 NOTE — Op Note (Signed)
01/28/2019  12:26 PM  PATIENT:  Tammy Mullins  68 y.o. 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 Decompressive lumbar laminectomy and hemifacetectomy L3-4 and L4-5 left requiring more work than would be required for a simple exposure of the disk for TLIF in order to adequately decompress the neural elements and address the spinal stenosis 2.  Transforaminal lumbar interbody fusion L3 4 L4-5 using porous titanium interbody cages packed with morcellized allograft and autograft  3. Posterior fixation L3-L5 inclusive using Alphatec cortical pedicle screws.  4. Intertransverse arthrodesis L3-L5 right using morcellized autograft and allograft.  SURGEON:  Sherley Bounds, MD  ASSISTANTS: Glenford Peers, FNP  ANESTHESIA:  General  EBL: 300 ml  Total I/O In: 1000 [I.V.:1000] Out: 485 [Urine:185; Blood:300]  BLOOD ADMINISTERED:none  DRAINS: none   INDICATION FOR PROCEDURE: This patient presented with back and leg pain. Imaging revealed dynamic spondylolisthesis L3-4 L4-5. The patient tried a reasonable attempt at conservative medical measures without relief. I recommended decompression and instrumented fusion to address the stenosis as well as the segmental  instability.  Patient understood the risks, benefits, and alternatives and potential outcomes and wished to proceed.  PROCEDURE DETAILS:  The patient was brought to the operating room. After induction of generalized endotracheal anesthesia the patient was rolled into the prone position on chest rolls and all pressure points were padded. The patient's lumbar region was cleaned and then prepped with DuraPrep and draped in the usual sterile fashion. Anesthesia was injected and then a dorsal midline incision was made and carried down to the lumbosacral fascia. The fascia was opened and the paraspinous musculature was taken down in a  subperiosteal fashion to expose L3-4 and L4-5. A self-retaining retractor was placed. Intraoperative fluoroscopy confirmed my level, and I started with placement of the L3, L4 and L5 cortical pedicle screws. The pedicle screw entry zones were identified utilizing surface landmarks and  AP and lateral fluoroscopy. I scored the cortex with the high-speed drill and then used the hand drill to drill an upward and outward direction into the pedicle. I then tapped line to line. I then placed a 6.5 x 40 mm cortical pedicle screw into the pedicles of L3, L4, and L5 bilaterally.  My nurse practitioner was directly involved in placement of the pedicle screws.  I then turned my attention to the decompression and complete lumbar laminectomies, hemi- facetectomies, and foraminotomies were performed at L3 4 and L4-5 on the left.  My nurse practitioner was directly involved in the decompression and exposure of the neural elements. the patient had significant spinal stenosis and this required more work than would be required for a simple exposure of the disc for lumbar interbody fusion which would only require a limited laminotomy. Much more generous decompression and generous foraminotomy was undertaken in order to adequately decompress the neural elements and address the patient's leg pain. The yellow ligament was removed to expose the underlying dura and nerve roots, and generous foraminotomies were performed to adequately decompress the neural elements. Both the exiting and traversing nerve roots were decompressed until a coronary dilator passed easily along the nerve roots. Once the decompression was complete, I turned my attention to the transforaminal lumbar interbody fusion. The epidural venous vasculature was coagulated and cut sharply. Disc space was incised and the initial discectomy was performed with pituitary rongeurs. The disc space was distracted with distraction against the cortical pedicle screws  on the right.  We  then used a series of scrapers and shavers to prepare the endplates for fusion. The midline was prepared with Epstein curettes. Once the complete discectomy was finished, we packed an appropriate sized interbody cage with local autograft and morcellized allograft, gently retracted the nerve root, and tapped the cage into position at L3-4 and L4-5 from the left.  Posterior to the cages was packed with morselized autograft and allograft.    We then decorticated the transverse processes and laid a mixture of morcellized autograft and allograft out over these to perform intertransverse arthrodesis at L3-L5 on the right. We then placed lordotic rods into the multiaxial screw heads of the pedicle screws and locked these in position with the locking caps and anti-torque device. We then checked our construct with AP and lateral fluoroscopy. Irrigated with copious amounts of bacitracin-containing saline solution. Inspected the nerve roots once again to assure adequate decompression, lined to the dura with Gelfoam, and then we closed the muscle and the fascia with 0 Vicryl. Closed the subcutaneous tissues with 2-0 Vicryl and subcuticular tissues with 3-0 Vicryl. The skin was closed with benzoin and Steri-Strips. Dressing was then applied, the patient was awakened from general anesthesia and transported to the recovery room in stable condition. At the end of the procedure all sponge, needle and instrument counts were correct.   PLAN OF CARE: admit to inpatient  PATIENT DISPOSITION:  PACU - hemodynamically stable.   Delay start of Pharmacological VTE agent (>24hrs) due to surgical blood loss or risk of bleeding:  yes

## 2019-01-28 NOTE — Anesthesia Postprocedure Evaluation (Signed)
Anesthesia Post Note  Patient: Sabriyah Jude  Procedure(s) Performed: TRANSFORAMINAL LUMBAR INTERBODY FUSION LUMBAR THREE-FOUR, LUMBAR FOUR-FIVE (N/A Spine Lumbar)     Patient location during evaluation: PACU Anesthesia Type: General Level of consciousness: awake and alert Pain management: pain level controlled Vital Signs Assessment: post-procedure vital signs reviewed and stable Respiratory status: spontaneous breathing, nonlabored ventilation and respiratory function stable Cardiovascular status: blood pressure returned to baseline and stable Postop Assessment: no apparent nausea or vomiting Anesthetic complications: no    Last Vitals:  Vitals:   01/28/19 1300 01/28/19 1314  BP: 126/81 120/75  Pulse: (!) 102 (!) 101  Resp: 14 10  Temp:    SpO2: 100% 94%    Last Pain:  Vitals:   01/28/19 1325  PainSc: 4                  Lynda Rainwater

## 2019-01-28 NOTE — Anesthesia Preprocedure Evaluation (Addendum)
Anesthesia Evaluation  Patient identified by MRN, date of birth, ID band Patient awake    Reviewed: Allergy & Precautions, NPO status , Patient's Chart, lab work & pertinent test results  History of Anesthesia Complications Negative for: history of anesthetic complications  Airway Mallampati: II  TM Distance: >3 FB Neck ROM: Limited    Dental no notable dental hx. (+) Teeth Intact, Dental Advisory Given, Missing,    Pulmonary sleep apnea ,    Pulmonary exam normal breath sounds clear to auscultation       Cardiovascular hypertension, Pt. on medications Normal cardiovascular exam Rhythm:Regular Rate:Normal     Neuro/Psych PSYCHIATRIC DISORDERS Anxiety Depression  Neuromuscular disease negative neurological ROS     GI/Hepatic Neg liver ROS, GERD  ,  Endo/Other  diabetes, Type 2  Renal/GU negative Renal ROS  negative genitourinary   Musculoskeletal  (+) Arthritis , Fibromyalgia -  Abdominal (+) + obese,   Peds negative pediatric ROS (+)  Hematology negative hematology ROS (+)   Anesthesia Other Findings   Reproductive/Obstetrics negative OB ROS                            Anesthesia Physical  Anesthesia Plan  ASA: II  Anesthesia Plan: General   Post-op Pain Management:    Induction: Intravenous  PONV Risk Score and Plan: 3 and Ondansetron, Dexamethasone, Midazolam and Treatment may vary due to age or medical condition  Airway Management Planned: Oral ETT  Additional Equipment:   Intra-op Plan:   Post-operative Plan: Extubation in OR  Informed Consent: I have reviewed the patients History and Physical, chart, labs and discussed the procedure including the risks, benefits and alternatives for the proposed anesthesia with the patient or authorized representative who has indicated his/her understanding and acceptance.       Plan Discussed with: Anesthesiologist and  Surgeon  Anesthesia Plan Comments:         Anesthesia Quick Evaluation

## 2019-01-28 NOTE — H&P (Signed)
Subjective: Patient is a 68 y.o. female admitted for TLIF. Onset of symptoms was several months ago, gradually worsening since that time.  The pain is rated severe, and is located at the across the lower back and radiates to legs. The pain is described as aching and occurs all day. The symptoms have been progressive. Symptoms are exacerbated by exercise. MRI or CT showed spondylolisthesis L3-4 L4-5   Past Medical History:  Diagnosis Date  . Allergic rhinitis, cause unspecified   . Allergy   . Anxiety state, unspecified   . Arthritis   . Attention deficit disorder without mention of hyperactivity   . Barrett's esophagus   . Blood transfusion without reported diagnosis 2018-10  . Chronic pain syndrome   . 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 Depression   . Diabetes type 2, controlled ([place]) 01/09/2014  . Diverticulosis of colon (without mention of hemorrhage)   . Esophageal reflux   . Fibromyalgia   . Gout, unspecified   . Headache   . Hiatal hernia   . Irritable bowel syndrome   . 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 Myalgia and myositis, unspecified   . Osteoarthrosis, unspecified whether generalized or localized, unspecified site   . Personal history of unspecified circulatory disease   . Pure hypercholesterolemia   . Radial styloid tenosynovitis   . Unspecified essential hypertension   . Unspecified nonpsychotic mental disorder   . Unspecified pruritic disorder     Past Surgical History:  Procedure Laterality Date  . ANKLE SURGERY Right   . APPENDECTOMY    . CARPAL TUNNEL RELEASE Right   . CERVICAL SPINE SURGERY     plates and pins  . CHOLECYSTECTOMY N/A 06/26/2015   Procedure: LAPAROSCOPIC CHOLECYSTECTOMY;  Surgeon: Ralene Ok, MD;  Location: [place];  Service: General;  Laterality: N/A;  . COLONOSCOPY    . COLONOSCOPY  2019  . ESOPHAGOGASTRODUODENOSCOPY    . ESOPHAGOGASTRODUODENOSCOPY N/A 12/04/2016   Procedure: ESOPHAGOGASTRODUODENOSCOPY (EGD);   Surgeon: Ladene Artist, MD;  Location[facility] ENDOSCOPY;  Service: Endoscopy;  Laterality: N/A;  . LAPAROSCOPIC APPENDECTOMY N/A 06/26/2015   Procedure: LAPAROSCOPIC APPENDECTOMY;  Surgeon: Ralene Ok, MD;  Location: [place];  Service: General;  Laterality: N/A;  . ROTATOR CUFF REPAIR Right   . SALPINGOOPHORECTOMY  Left   ectopic 1980  . TOTAL SHOULDER ARTHROPLASTY    . UPPER GASTROINTESTINAL ENDOSCOPY      Prior to Admission medications   Medication Sig Start Date End Date Taking? Authorizing Provider  allopurinol (ZYLOPRIM) 300 MG tablet TAKE 1 AND 1/2 TABLETS(450 MG) BY MOUTH DAILY 01/21/19  Yes Deveshwar, Abel Presto, MD  amLODipine (NORVASC) 10 MG tablet TAKE 1 TABLET DAILY 10/21/13  Yes Debbrah Alar, NP  aspirin 81 MG tablet Take 81 mg by mouth daily.     Yes [provider]  b complex vitamins capsule Take 1 capsule by mouth daily. 11/09/18  Yes Hurst, Dorothea Glassman, PA-C  butalbital-acetaminophen-caffeine (FIORICET) 50-325-40 MG tablet Take 1 tablet by mouth once as needed for up to 1 dose for headache. Once a 01/14/19  Yes Marcial Pacas, MD  calcium carbonate (OSCAL) 1500 (600 Ca) MG TABS tablet Take 600 mg of elemental calcium by mouth daily with breakfast.   Yes [provider]  CALCIUM-MAGNESIUM-ZINC PO Take by mouth daily.   Yes [provider]  Cholecalciferol (VITAMIN D) 2000 UNITS CAPS Take 2,000 Units by mouth daily.    Yes [provider]  diclofenac sodium (VOLTAREN)  1 % GEL APPLY 2-4 GRAMS TO AFFECTED  JOINTS UP TO FOUR TIMES DAILY Patient taking differently: Apply 1 application topically 4 (four) times daily. APPLY 2-4 GRAMS TO AFFECTED  JOINTS UP TO FOUR TIMES DAILY 07/30/18  Yes Deveshwar, Abel Presto, MD  dicyclomine (BENTYL) 20 MG tablet Take 1 tablet (20 mg total) by mouth 3 (three) times daily as needed. 11/28/12  Yes Noralee Space, MD  DULoxetine (CYMBALTA) 60 MG capsule Take 2 capsules (120 mg total) by mouth daily. 11/09/18  Yes Donnal Moat  T, PA-C  ferrous sulfate 325 (65 FE) MG tablet Take 1 tablet (325 mg total) by mouth 2 (two) times daily with a meal. 12/05/16  Yes Georgette Shell, MD  fluticasone [facility]) 50 MCG/ACT nasal spray Place 2 sprays into both nostrils at bedtime. Patient taking differently: Place 2 sprays into both nostrils at bedtime as needed for allergies.  10/21/13  Yes Debbrah Alar, NP  gabapentin (NEURONTIN) 600 MG tablet TAKE 1 TABLET BY MOUTH AT BEDTIME 01/21/19  Yes Deveshwar, Abel Presto, MD  Garlic [id number] MG CAPS Take 300 mg by mouth daily.    Yes [provider]  hydrochlorothiazide (HYDRODIURIL) 25 MG tablet Take 1 tablet (25 mg total) by mouth daily. 12/18/13  Yes Debbrah Alar, NP  losartan (COZAAR) 100 MG tablet Take 1 tablet (100 mg total) by mouth daily. 05/08/12  Yes Noralee Space, MD  metoprolol succinate (TOPROL-XL) 100 MG 24 hr tablet TAKE 1 TABLET DAILY 10/21/13  Yes Debbrah Alar, NP  mometasone (NASONEX) 50 MCG/ACT nasal spray Place 2 sprays into the nose daily as needed (FOR ALLERGIES).   Yes [provider]  omeprazole (PRILOSEC) 20 MG capsule Take 20 mg by mouth daily.   Yes [provider]  pantoprazole (PROTONIX) 40 MG tablet Take 1 tablet (40 mg total) by mouth daily. 12/06/16  Yes Georgette Shell, MD  QUEtiapine (SEROQUEL) 300 MG tablet Take 1 tablet (300 mg total) by mouth at bedtime. 11/09/18  Yes Donnal Moat T, PA-C  raloxifene (EVISTA) 60 MG tablet Take 60 mg by mouth daily.  05/11/15  Yes [provider]  topiramate (TOPAMAX) 50 MG tablet Take 1 tablet (50 mg total) by mouth 2 (two) times daily. 01/21/19  Yes Penumalli, Earlean Polka, MD   Allergies  Allergen Reactions  . Crestor [Rosuvastatin Calcium] Other (See Comments)    Causes severe body aches  . Asa [Aspirin]     GI upset in hight doses can take a 81mg   . Celebrex [Celecoxib] Swelling and Rash    Swelling in legs    Social History   Tobacco Use  . Smoking status:  Never Smoker  . Smokeless tobacco: Never Used  Substance Use Topics  . Alcohol use: No    Family History  Problem Relation Age of Onset  . Cirrhosis Father   . Cancer Maternal Grandmother        ?  . Lung disease Mother        ?   . Glaucoma Mother   . Glaucoma Brother   . Prostate cancer Brother   . Heart disease Sister   . Kidney disease Neg Hx   . Diabetes Neg Hx   . Colon cancer Neg Hx   . Colon polyps Neg Hx   . Rectal cancer Neg Hx   . Stomach cancer Neg Hx      Review of Systems  Positive ROS: neg  All other systems have been reviewed and were otherwise  negative with the exception of those mentioned in the HPI and as above.  Objective: Vital signs in last 24 hours: Temp:  [98.6 F (37 C)] 98.6 F (37 C) (11/30 0715) Pulse Rate:  [100-114] 100 (11/30 0739) Resp:  [18] 18 (11/30 0715) BP: (151)/(77) 151/77 (11/30 0715) SpO2:  [99 %] 99 % (11/30 0715)  General Appearance: Alert, cooperative, no distress, appears stated age Head: Normocephalic, without obvious abnormality, atraumatic Eyes: PERRL, conjunctiva/corneas clear, EOM's intact    Neck: Supple, symmetrical, trachea midline Back: Symmetric, no curvature, ROM normal, no CVA tenderness Lungs:  respirations unlabored Heart: Regular rate and rhythm Abdomen: Soft, non-tender Extremities: Extremities normal, atraumatic, no cyanosis or edema Pulses: 2+ and symmetric all extremities Skin: Skin color, texture, turgor normal, no rashes or lesions  NEUROLOGIC:   Mental status: Alert and oriented x4,  no aphasia, good attention span, fund of knowledge, and memory Motor Exam - grossly normal Sensory Exam - grossly normal Reflexes: 1+ Coordination - grossly normal Gait - grossly normal Balance - grossly normal Cranial Nerves: I: smell Not tested  II: visual acuity  OS: nl    OD: nl  II: visual fields Full to confrontation  II: pupils Equal, round, reactive to light  III,VII: ptosis None  III,IV,VI:  extraocular muscles  Full ROM  V: mastication Normal  V: facial light touch sensation  Normal  V,VII: corneal reflex  Present  VII: facial muscle function - upper  Normal  VII: facial muscle function - lower Normal  VIII: hearing Not tested  IX: soft palate elevation  Normal  IX,X: gag reflex Present  XI: trapezius strength  5/5  XI: sternocleidomastoid strength 5/5  XI: neck flexion strength  5/5  XII: tongue strength  Normal    Data Review Lab Results  Component Value Date   WBC 7.5 01/25/2019   HGB 12.5 01/25/2019   HCT 40.2 01/25/2019   MCV 100.5 (H) 01/25/2019   PLT 318 01/25/2019   Lab Results  Component Value Date   NA 140 01/25/2019   K 4.2 01/25/2019   CL 104 01/25/2019   CO2 26 01/25/2019   BUN 12 01/25/2019   CREATININE 0.93 01/25/2019   GLUCOSE 132 (H) 01/25/2019   Lab Results  Component Value Date   INR 0.9 01/25/2019    Assessment/Plan:  Estimated body mass index is 30.4 kg/m as calculated from the following:   Height as of 01/25/19: 5\' 5"  (1.651 m).   Weight as of 01/25/19: 82.9 kg. Patient admitted for TLIF L3-4 L4-5. Patient has failed a reasonable attempt at conservative therapy.  I explained the condition and procedure to the patient and answered any questions.  Patient wishes to proceed with procedure as planned. Understands risks/ benefits and typical outcomes of procedure.   Eustace Moore 01/28/2019 8:28 AM

## 2019-01-29 ENCOUNTER — Encounter (HOSPITAL_COMMUNITY): Payer: Self-pay | Admitting: Neurological Surgery

## 2019-01-29 LAB — GLUCOSE, CAPILLARY
Glucose-Capillary: 128 mg/dL — ABNORMAL HIGH (ref 70–99)
Glucose-Capillary: 120 mg/dL — ABNORMAL HIGH (ref 70–99)
Glucose-Capillary: 122 mg/dL — ABNORMAL HIGH (ref 70–99)
Glucose-Capillary: 116 mg/dL — ABNORMAL HIGH (ref 70–99)

## 2019-01-29 NOTE — Progress Notes (Signed)
Occupational Therapy Treatment Patient Details Name: Tammy Mullins MRN: [id number] DOB: Sep 07, 1950 Today's Date: 01/29/2019    History of present illness 68 yo [name] admitted for transforaminal lumbar interbody fusion L3 4 L4-5. PHMx: DM2, chronic pain, fibromylgia, depression, gout, shoulder arthoplasty   OT comments  This 68 yo female progressing towards grooming, toilet transfer, toileting, donning brace and recalling back precautions. Still needs cues for bed mobility. She will continue to benefit from acute OT without follow up, but will needs initially 24 hour S/prn A at home.  Follow Up Recommendations  No OT follow up;Supervision/Assistance - 24 hour    Equipment Recommendations  None recommended by OT       Precautions / Restrictions Precautions Precautions: Back Precaution Booklet Issued: Yes (comment) Precaution Comments: Per Dr. Ronnald Ramp in room: brace is for comfort only Required Braces or Orthoses: Spinal Brace Spinal Brace: Lumbar corset;Applied in sitting position;Applied in standing position Restrictions Weight Bearing Restrictions: No       Mobility Bed Mobility Overal bed mobility: Needs Assistance Bed Mobility: Rolling;Sidelying to Sit Rolling: Min guard Sidelying to sit: Min guard       General bed mobility comments: VCs for technique  Transfers Overall transfer level: Needs assistance Equipment used: Straight cane   Sit to Stand: Min guard         General transfer comment: increased time    Balance Overall balance assessment: Needs assistance Sitting-balance support: No upper extremity supported Sitting balance-Leahy Scale: Good     Standing balance support: No upper extremity supported;During functional activity Standing balance-Leahy Scale: Fair Standing balance comment: standing at sink for grooming. Reports feeling better with [facility] (which she has at home), but also would like to try RW                           ADL  either performed or assessed with clinical judgement   ADL Overall ADL's : Needs assistance/impaired     Grooming: Wash/dry hands;Wash/dry face;Oral care;Supervision/safety;Standing Grooming Details (indicate cue type and reason): Pt followed no bending precautions at sink for brushing teeth by using 2 cups               Lower Body Dressing Details (indicate cue type and reason): Pt cannot cross her legs to get to her feet, she reports that her family will A her prn and she will have someone with her all the time Toilet Transfer: Min guard;Ambulation;Comfort height toilet;Grab bars Toilet Transfer Details (indicate cue type and reason): SPC Toileting- Clothing Manipulation and Hygiene: Min guard;Sit to/from stand               Vision Baseline Vision/History: Wears glasses Wears Glasses: Reading only Patient Visual Report: No change from baseline            Cognition Arousal/Alertness: Awake/alert Behavior During Therapy: WFL for tasks assessed/performed Overall Cognitive Status: Within Functional Limits for tasks assessed                                 General Comments: Did not recall back precautions from yesterday, but once I told her she was able to recall them 2 more times during session when asked.                   Pertinent Vitals/ Pain       Pain Assessment: 0-10 Pain Score: 4  Pain  Location: incisional Pain Descriptors / Indicators: Aching;Operative site guarding;Sore Pain Intervention(s): Limited activity within patient's tolerance;Monitored during session;Patient requesting pain meds-RN notified;RN gave pain meds during session         Frequency  Min 2X/week        Progress Toward Goals  OT Goals(current goals can now be found in the care plan section)  Progress towards OT goals: Progressing toward goals     Plan Discharge plan remains appropriate       AM-PAC OT "6 Clicks" Daily Activity     Outcome Measure   Help  from another person eating meals?: None Help from another person taking care of personal grooming?: A Little Help from another person toileting, which includes using toliet, bedpan, or urinal?: A Little Help from another person bathing (including washing, rinsing, drying)?: A Lot Help from another person to put on and taking off regular upper body clothing?: A Little Help from another person to put on and taking off regular lower body clothing?: A Lot 6 Click Score: 17    End of Session Equipment Utilized During Treatment: Rolling walker;Back brace  OT Visit Diagnosis: Unsteadiness on feet (R26.81);Other abnormalities of gait and mobility (R26.89);Muscle weakness (generalized) (M62.81);Pain Pain - part of body: (incisional)   Activity Tolerance Patient tolerated treatment well   Patient Left in chair;with call bell/phone within reach   Nurse Communication Patient requests pain meds        Time: [id number] OT Time Calculation (min): 35 min  Charges: OT General Charges $OT Visit: 1 Visit OT Treatments $Self Care/Home Management : 23-37 mins  [place] [address], OTR/L Acute [employer] Pager [phone] Office [phone]      Almon Register 01/29/2019, 8:58 AM

## 2019-01-29 NOTE — Progress Notes (Signed)
Subjective: Patient reports appropriate back soreness, no leg pain or NTW  Objective: Vital signs in last 24 hours: Temp:  [98.3 F (36.8 C)-98.7 F (37.1 C)] 98.3 F (36.8 C) (12/01 0403) Pulse Rate:  [89-104] 89 (12/01 0403) Resp:  [10-20] 20 (12/01 0403) BP: (105-159)/(58-82) 107/61 (12/01 0403) SpO2:  [91 %-100 %] 91 % (12/01 0403) Weight:  [82.9 kg] 82.9 kg (11/30 1400)  Intake/Output from previous day: 11/30 0701 - 12/01 0700 In: 1803 [I.V.:1603; IV Piggyback:200] Out: 485 [Urine:185; Blood:300] Intake/Output this shift: No intake/output data recorded.  Neurologic: Grossly normal, dressing dry  Lab Results: Lab Results  Component Value Date   WBC 7.5 01/25/2019   HGB 12.5 01/25/2019   HCT 40.2 01/25/2019   MCV 100.5 (H) 01/25/2019   PLT 318 01/25/2019   Lab Results  Component Value Date   INR 0.9 01/25/2019   BMET Lab Results  Component Value Date   NA 140 01/25/2019   K 4.2 01/25/2019   CL 104 01/25/2019   CO2 26 01/25/2019   GLUCOSE 132 (H) 01/25/2019   BUN 12 01/25/2019   CREATININE 0.93 01/25/2019   CALCIUM 10.2 01/25/2019    Studies/Results: Dg Lumbar Spine 2-3 Views  Result Date: 01/28/2019 CLINICAL DATA:  Lumbar fusion. EXAM: LUMBAR SPINE - 2-3 VIEW; DG C-ARM 1-60 MIN COMPARISON:  MRI lumbar spine 01/24/2018 FINDINGS: Pedicle screws, posterior rods and interbody fusion devices are in good position at L3-4 and L4-5. No complicating features are identified. IMPRESSION: Fusion hardware in good position without complicating features. Electronically Signed   By: Marijo Sanes M.D.   On: 01/28/2019 12:10   Dg C-arm 1-60 Min  Result Date: 01/28/2019 CLINICAL DATA:  Lumbar fusion. EXAM: LUMBAR SPINE - 2-3 VIEW; DG C-ARM 1-60 MIN COMPARISON:  MRI lumbar spine 01/24/2018 FINDINGS: Pedicle screws, posterior rods and interbody fusion devices are in good position at L3-4 and L4-5. No complicating features are identified. IMPRESSION: Fusion hardware in good  position without complicating features. Electronically Signed   By: Marijo Sanes M.D.   On: 01/28/2019 12:10    Assessment/Plan: Doing well, mobilize today and likely home tomorrow  Estimated body mass index is 30.41 kg/m as calculated from the following:   Height as of this encounter: 5\' 5"  (1.651 m).   Weight as of this encounter: 82.9 kg.    LOS: 1 day    Eustace Moore 01/29/2019, 7:54 AM

## 2019-01-29 NOTE — Evaluation (Signed)
Physical Therapy Evaluation Patient Details Name: Tammy Mullins MRN: [id number] DOB: 05/17/1950 Today's Date: 01/29/2019   History of Present Illness  68 yo Tammy Mullins admitted for transforaminal lumbar interbody fusion L3 4 L4-5. PHMx: DM2, chronic pain, fibromylgia, depression, gout, shoulder arthoplasty  Clinical Impression  Patient is s/p above surgery resulting in the deficits listed below (see PT Problem List). Pt with difficulty ambulating with cane and RW. Patient will benefit from skilled PT to increase their independence and safety with mobility (while adhering to their precautions) to allow discharge to the venue listed below.     Follow Up Recommendations Home health PT;Supervision - Intermittent (to progress amb with least restrictive device)    Equipment Recommendations  Rolling walker with 5" wheels    Recommendations for Other Services       Precautions / Restrictions Precautions Precautions: Back Precaution Booklet Issued: Yes (comment) Precaution Comments: pt able to recall 2/3, pt re-educated on back precautions Required Braces or Orthoses: Spinal Brace Spinal Brace: Lumbar corset;Applied in sitting position;Applied in standing position Restrictions Weight Bearing Restrictions: No      Mobility  Bed Mobility Overal bed mobility: Needs Assistance Bed Mobility: Rolling;Sidelying to Sit Rolling: Min guard Sidelying to sit: Min guard       General bed mobility comments: pt up in chair, went over log roll technique  Transfers Overall transfer level: Needs assistance Equipment used: None Transfers: Sit to/from Stand Sit to Stand: Min guard         General transfer comment: increased time  Ambulation/Gait Ambulation/Gait assistance: Min assist Gait Distance (Feet): 150 Feet Assistive device: Rolling walker (2 wheeled);Straight cane Gait Pattern/deviations: Step-to pattern;Step-through pattern;Decreased stride length Gait velocity: slow Gait  velocity interpretation: <1.31 ft/sec, indicative of household ambulator General Gait Details: attempted to use cane however pt with a very difficult time sequencing cane despite max verbal cues, used RW for more fluid and stedy gait pattern, encouraged pt to increase step length and stand upright/decrease UE Museum/gallery conservator    Modified Rankin (Stroke Patients Only)       Balance Overall balance assessment: Needs assistance Sitting-balance support: No upper extremity supported Sitting balance-Leahy Scale: Good     Standing balance support: No upper extremity supported;During functional activity Standing balance-Leahy Scale: Fair Standing balance comment: standing at sink for grooming. Reports feeling better with SPC (which she has at home), but also would like to try RW                             Pertinent Vitals/Pain Pain Assessment: 0-10 Pain Score: 4  Pain Location: incisional Pain Descriptors / Indicators: Aching;Operative site guarding;Sore Pain Intervention(s): Monitored during session    Home Living Family/patient expects to be discharged to:: Private residence Living Arrangements: Alone Available Help at Discharge: Family;Available 24 hours/day Type of Home: House Home Access: (one step up on porch and then threshold step)     Home Layout: One level Home Equipment: Shower seat;Grab bars - toilet;Grab bars - tub/shower;Hand held shower head;Cane - single point Additional Comments: reports she has 3 nieces that will stay with her    Prior Function Level of Independence: Independent               Hand Dominance   Dominant Hand: Right    Extremity/Trunk Assessment   Upper Extremity Assessment Upper Extremity Assessment: Overall WFL for  tasks assessed    Lower Extremity Assessment Lower Extremity Assessment: Generalized weakness    Cervical / Trunk Assessment Cervical / Trunk Assessment: Other  exceptions Cervical / Trunk Exceptions: back surgery  Communication   Communication: No difficulties  Cognition Arousal/Alertness: Awake/alert Behavior During Therapy: WFL for tasks assessed/performed Overall Cognitive Status: History of cognitive impairments - at baseline Area of Impairment: Problem solving;Safety/judgement                         Safety/Judgement: (didn't recall precautions)   Problem Solving: Slow processing;Difficulty sequencing;Requires verbal cues;Requires tactile cues(very hard time with cane) General Comments: pt had a very hard time with cane sequencing      General Comments General comments (skin integrity, edema, etc.): VSS    Exercises     Assessment/Plan    PT Assessment Patient needs continued PT services  PT Problem List Decreased strength;Decreased range of motion;Decreased mobility;Decreased coordination;Decreased cognition;Decreased knowledge of use of DME;Decreased safety awareness       PT Treatment Interventions DME instruction;Gait training;Stair training;Functional mobility training;Therapeutic activities;Therapeutic exercise;Balance training;Neuromuscular re-education    PT Goals (Current goals can be found in the Care Plan section)  Acute Rehab PT Goals Patient Stated Goal: to be able to do for myself again PT Goal Formulation: With patient Time For Goal Achievement: 02/12/19 Potential to Achieve Goals: Good    Frequency Min 5X/week   Barriers to discharge Decreased caregiver support lives alone but reports 3 nieces will be able to assist her    Co-evaluation               AM-PAC PT "6 Clicks" Mobility  Outcome Measure Help needed turning from your back to your side while in a flat bed without using bedrails?: A Little Help needed moving from lying on your back to sitting on the side of a flat bed without using bedrails?: A Little Help needed moving to and from a bed to a chair (including a wheelchair)?: A  Little Help needed standing up from a chair using your arms (e.g., wheelchair or bedside chair)?: A Little Help needed to walk in hospital room?: A Little Help needed climbing 3-5 steps with a railing? : A Little 6 Click Score: 18    End of Session Equipment Utilized During Treatment: Gait belt;Back brace Activity Tolerance: Patient tolerated treatment well Patient left: in chair;with call bell/phone within reach Nurse Communication: Mobility status PT Visit Diagnosis: Muscle weakness (generalized) (M62.81);Difficulty in walking, not elsewhere classified (R26.2);Pain Pain - part of body: (back)    Time: [id number] PT Time Calculation (min) (ACUTE ONLY): 23 min   Charges:   PT Evaluation $PT Eval Moderate Complexity: 1 Mod PT Treatments $Gait Training: 8-22 mins        Kittie Plater, PT, DPT Acute Rehabilitation Services Pager #: [phone] Office #: [phone]   Berline Lopes 01/29/2019, 9:34 AM

## 2019-01-30 LAB — GLUCOSE, CAPILLARY: Glucose-Capillary: 165 mg/dL — ABNORMAL HIGH (ref 70–99)

## 2019-01-30 MED ORDER — METHOCARBAMOL 500 MG PO TABS: 500.0000 mg | ORAL_TABLET | Freq: Four times a day (QID) | ORAL | 0 refills | Status: DC

## 2019-01-30 MED ORDER — HYDROCODONE-ACETAMINOPHEN 5-325 MG PO TABS: 1.0000 | ORAL_TABLET | ORAL | 0 refills | Status: DC | PRN

## 2019-01-30 NOTE — Discharge Summary (Signed)
Physician Discharge Summary  Patient ID: Tammy Mullins MRN: [id number] DOB/AGE: [birth year] 68 y.o.  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 The patient tolerated the procedure well and was taken to the recovery room and then to the floor in stable condition. The hospital course was routine. There were no complications. The wound remained clean dry and intact. Pt had appropriate back soreness. No complaints of leg pain or new N/T/W. The patient remained afebrile with stable vital signs, and tolerated a regular diet. The patient continued to increase activities, and pain was well controlled with oral pain medications.   Consults: None  Significant Diagnostic Studies:  Results for orders placed or performed during the hospital encounter of 01/28/19  Glucose, capillary  Result Value Ref Range   Glucose-Capillary 129 (H) 70 - 99 mg/dL  Glucose, capillary  Result Value Ref Range   Glucose-Capillary 136 (H) 70 - 99 mg/dL  Glucose, capillary  Result Value Ref Range   Glucose-Capillary 177 (H) 70 - 99 mg/dL  Glucose, capillary  Result Value Ref Range   Glucose-Capillary 170 (H) 70 - 99 mg/dL   Comment 1 Notify RN    Comment 2 Document in Chart   Glucose, capillary  Result Value Ref Range   Glucose-Capillary 128 (H) 70 - 99 mg/dL   Comment 1 Notify RN    Comment 2 Document in Chart   Glucose, capillary  Result Value Ref Range   Glucose-Capillary 120 (H) 70 - 99 mg/dL  Glucose, capillary  Result Value Ref Range   Glucose-Capillary 122 (H) 70 - 99 mg/dL  Glucose, capillary  Result Value Ref Range   Glucose-Capillary 116 (H) 70 - 99 mg/dL   Comment 1 Notify RN    Comment 2 Document in Chart    Glucose, capillary  Result Value Ref Range   Glucose-Capillary 165 (H) 70 - 99 mg/dL   Comment 1 Notify RN    Comment 2 Document in Chart     Chest 2 View  Result Date: 01/25/2019 CLINICAL DATA:  Preoperative exam for lumbar spine surgery. Spondylolisthesis. EXAM: CHEST - 2 VIEW COMPARISON:  12/04/2016 FINDINGS: The heart size and mediastinal contours are within normal limits. Both lungs are clear. Mild degenerative changes within the midthoracic spine. Partially visualized cervical fixation hardware. IMPRESSION: No active cardiopulmonary disease. Electronically Signed   By: Davina Poke M.D.   On: 01/25/2019 14:11   Dg Lumbar Spine 2-3 Views  Result Date: 01/28/2019 CLINICAL DATA:  Lumbar fusion. EXAM: LUMBAR SPINE - 2-3 VIEW; DG C-ARM 1-60 MIN COMPARISON:  MRI lumbar spine 01/24/2018 FINDINGS: Pedicle screws, posterior rods and interbody fusion devices are in good position at L3-4 and L4-5. No complicating features are identified. IMPRESSION: Fusion hardware in good position without complicating features. Electronically Signed   By: Marijo Sanes M.D.   On: 01/28/2019 12:10   Dg C-arm 1-60 Min  Result Date: 01/28/2019 CLINICAL DATA:  Lumbar fusion. EXAM: LUMBAR SPINE - 2-3 VIEW; DG C-ARM 1-60 MIN COMPARISON:  MRI lumbar spine 01/24/2018 FINDINGS: Pedicle screws, posterior rods and interbody fusion devices are in good position at L3-4 and L4-5. No complicating features are identified. IMPRESSION: Fusion hardware in good position without complicating features. Electronically Signed   By: Ricky Stabs.D.  On: 01/28/2019 12:10    Antibiotics:  Anti-infectives (From admission, onward)   Start     Dose/Rate Route Frequency Ordered Stop   01/28/19 1700  ceFAZolin (ANCEF) IVPB 2g/100 mL premix     2 g 200 mL/hr over 30 Minutes Intravenous Every 8 hours 01/28/19 1346 01/29/19 0128   01/28/19 1002  bacitracin 50,000 Units in sodium chloride 0.9 % 500 mL irrigation  Status:   Discontinued       As needed 01/28/19 1002 01/28/19 1222   01/28/19 0715  ceFAZolin (ANCEF) IVPB 2g/100 mL premix     2 g 200 mL/hr over 30 Minutes Intravenous On call to O.R. 01/28/19 0706 01/28/19 0904      Discharge Exam: Blood pressure 116/60, pulse (!) 103, temperature 99.9 F (37.7 C), temperature source Oral, resp. rate 18, height 5\' 5"  (1.651 m), weight 82.9 kg, SpO2 94 %. Neurologic: Grossly normal Ambulating and voiding well  Discharge Medications:   Allergies as of 01/30/2019      Reactions   Crestor [rosuvastatin Calcium] Other (See Comments)   Causes severe body aches   Asa [aspirin]    GI upset in hight doses can take a 81mg    Celebrex [celecoxib] Swelling, Rash   Swelling in legs      Medication List    TAKE these medications   allopurinol 300 MG tablet Commonly known as: ZYLOPRIM TAKE 1 AND 1/2 TABLETS(450 MG) BY MOUTH DAILY   amLODipine 10 MG tablet Commonly known as: NORVASC TAKE 1 TABLET DAILY   aspirin 81 MG tablet Take 81 mg by mouth daily.   b complex vitamins capsule Take 1 capsule by mouth daily.   butalbital-acetaminophen-caffeine 50-325-40 MG tablet Commonly known as: FIORICET Take 1 tablet by mouth once as needed for up to 1 dose for headache. Once a   calcium carbonate 1500 (600 Ca) MG Tabs tablet Commonly known as: OSCAL Take 600 mg of elemental calcium by mouth daily with breakfast.   CALCIUM-MAGNESIUM-ZINC PO Take by mouth daily.   diclofenac sodium 1 % Gel Commonly known as: VOLTAREN APPLY 2-4 GRAMS TO AFFECTED  JOINTS UP TO FOUR TIMES DAILY What changed:   how much to take  how to take this  when to take this   dicyclomine 20 MG tablet Commonly known as: BENTYL Take 1 tablet (20 mg total) by mouth 3 (three) times daily as needed.   DULoxetine 60 MG capsule Commonly known as: CYMBALTA Take 2 capsules (120 mg total) by mouth daily.   ferrous sulfate 325 (65 FE) MG tablet Take 1 tablet (325 mg total) by mouth 2  (two) times daily with a meal.   fluticasone 50 MCG/ACT nasal spray Commonly known as: FLONASE Place 2 sprays into both nostrils at bedtime. What changed:   when to take this  reasons to take this   gabapentin 600 MG tablet Commonly known as: NEURONTIN TAKE 1 TABLET BY MOUTH AT BEDTIME   Garlic [id number] MG Caps Take 300 mg by mouth daily.   hydrochlorothiazide 25 MG tablet Commonly known as: HYDRODIURIL Take 1 tablet (25 mg total) by mouth daily.   HYDROcodone-acetaminophen 5-325 MG tablet Commonly known as: NORCO/VICODIN Take 1-2 tablets by mouth every 4 (four) hours as needed for moderate pain.   losartan 100 MG tablet Commonly known as: COZAAR Take 1 tablet (100 mg total) by mouth daily.   methocarbamol 500 MG tablet Commonly known as: Robaxin Take 1 tablet (500 mg total) by mouth 4 (four) times daily.  metoprolol succinate 100 MG 24 hr tablet Commonly known as: TOPROL-XL TAKE 1 TABLET DAILY   mometasone 50 MCG/ACT nasal spray Commonly known as: NASONEX Place 2 sprays into the nose daily as needed (FOR ALLERGIES).   omeprazole 20 MG capsule Commonly known as: PRILOSEC Take 20 mg by mouth daily.   pantoprazole 40 MG tablet Commonly known as: PROTONIX Take 1 tablet (40 mg total) by mouth daily.   QUEtiapine 300 MG tablet Commonly known as: SEROQUEL Take 1 tablet (300 mg total) by mouth at bedtime.   raloxifene 60 MG tablet Commonly known as: EVISTA Take 60 mg by mouth daily.   topiramate 50 MG tablet Commonly known as: TOPAMAX Take 1 tablet (50 mg total) by mouth 2 (two) times daily.   Vitamin D 50 MCG (2000 UT) Caps Take 2,000 Units by mouth daily.            Durable Medical Equipment  (From admission, onward)         Start     Ordered   01/30/19 0826  For home use only DME Walker rolling  [facility])  Once    Question:  Patient needs a walker to treat with the following condition  Answer:  History of lumbar fusion   01/30/19 0826   01/29/19  0938  For home use only DME Walker  Once    Question:  Patient needs a walker to treat with the following condition  Answer:  S/P lumbar spinal fusion   01/29/19 [id number]   01/28/19 1347  DME Walker rolling  Once    Question:  Patient needs a walker to treat with the following condition  Answer:  S/P lumbar fusion   01/28/19 1346   01/28/19 1347  DME 3 n 1  Once     01/28/19 1346          Disposition: home   Final Dx: TLIF L3-4, L4-5  Discharge Instructions    Call MD for:  difficulty breathing, headache or visual disturbances   Complete by: As directed    Call MD for:  hives   Complete by: As directed    Call MD for:  persistant dizziness or light-headedness   Complete by: As directed    Call MD for:  persistant nausea and vomiting   Complete by: As directed    Call MD for:  redness, tenderness, or signs of infection (pain, swelling, redness, odor or green/yellow discharge around incision site)   Complete by: As directed    Call MD for:  severe uncontrolled pain   Complete by: As directed    Call MD for:  temperature >100.4   Complete by: As directed    Diet - low sodium heart healthy   Complete by: As directed    Driving Restrictions   Complete by: As directed    No driving for 2 weeks, no riding in the car for 1 week   Increase activity slowly   Complete by: As directed    Lifting restrictions   Complete by: As directed    No lifting more than 8 lbs   Remove dressing in 48 hours   Complete by: As directed          Signed: Ocie Cornfield Blondine Hottel 01/30/2019, 8:27 AM

## 2019-01-30 NOTE — TOC Transition Note (Addendum)
Transition of Care [facility]) - CM/SW Discharge Note   Patient Details  Name: Tammy Mullins MRN: [id number] Date of Birth: 1950-11-22  Transition of Care [facility]) CM/SW Contact:  Sharin Mons, RN Phone Number: 01/30/2019, 7:44 AM   Clinical Narrative:     Admitted s/p transforaminal lumbar interbody fusion L3 4 L4-5, 01/29/2019. PHMx: DM2, chronic pain, fibromylgia, depression, gout, shoulder arthoplasty. From home with niece.   NCM shared PT's evaluation/ recommendation: HHPT,OT. Pt agreeable to [facility] services. Choice provided . Pt without preference. Referral made with [facility]....awaiting response.  Pt states has transportation to home.  Final next level of care: [place] Barriers to Discharge: No Barriers Identified   Patient Goals and CMS Choice Patient states their goals for this hospitalization and ongoing recovery are:: to go home and recover CMS Medicare.gov Compare Post Acute Care list provided to:: Patient Choice offered to / list presented to : Patient  Discharge Placement                       Discharge Plan and Services                          HH Arranged: PT, OT [facility] Agency: [place] ([facility]) Date [facility] Agency Contacted: 01/30/19 Time [place]: [phone] Representative spoke with at [place]: Left voice message with Mateo Flow  Social Determinants of Health (SDOH) Interventions     Readmission Risk Interventions No flowsheet data found.

## 2019-01-30 NOTE — Discharge Instructions (Signed)
Wound Care Keep incision covered and dry for one week.  If you shower prior to then, cover incision with plastic wrap.  You may remove outer bandage after one week and shower.  Do not put any creams, lotions, or ointments on incision. Leave steri-strips on neck.  They will fall off by themselves. Activity Walk each and every day, increasing distance each day. No lifting greater than 5 lbs.  Avoid bending, arching, or twisting. No driving for 2 weeks; may ride as a passenger locally. If provided with back brace, wear when out of bed.  It is not necessary to wear in bed. Diet Resume your normal diet.  Return to Work Will be discussed at you follow up appointment. Call Your Doctor If Any of These Occur Redness, drainage, or swelling at the wound.  Temperature greater than 101 degrees. Severe pain not relieved by pain medication. Incision starts to come apart. Follow Up Appt Call today for appointment in 1-2 weeks [id number]) or for problems.  If you have any hardware placed in your spine, you will need an x-ray before your appointment.    Spinal Fusion, Adult, Care After This sheet gives you information about how to care for yourself after your procedure. Your doctor may also give you more specific instructions. If you have problems or questions, contact your doctor. Follow these instructions at home: Medicines  Take over-the-counter and prescription medicines only as told by your doctor. These include any medicines for pain or blood-thinning medicines (anticoagulants).  If you were prescribed an antibiotic medicine, take it as told by your doctor. Do not stop taking the antibiotic even if you start to feel better.  Do not drive for 24 hours if you were given a medicine to help you relax (sedative) during your procedure.  Do not drive or use heavy machinery while taking prescription pain medicine. If you have a brace:  Wear the brace as told by your doctor. Take it off only as told  by your doctor.  Keep the brace clean. Managing pain, stiffness, and swelling  If directed, put ice on the surgery area: ? If you have a removable brace, take it off as told by your doctor. ? Put ice in a plastic bag. ? Place a towel between your skin and the bag. ? Leave the ice on for 20 minutes, 2-3 times a day. Surgery cut care   Follow instructions from your doctor about how to take care of your cut from surgery (incision). Make sure you: ? Wash your hands with soap and water before you change your bandage (dressing). If you cannot use soap and water, use hand sanitizer. ? Change your bandage as told by your doctor. ? Leave stitches (sutures), skin glue, or skin tape (adhesive) strips in place. They may need to stay in place for 2 weeks or longer. If tape strips get loose and curl up, you may trim the loose edges. Do not remove tape strips completely unless your doctor says it is okay.  Keep your cut from surgery clean and dry. ? Do not take baths, swim, or use a hot tub until your doctor says it is okay. ? Ask your doctor if you can take showers. You may only be allowed to take sponge baths.  Every day, check your cut from surgery and the area around it for: ? More redness, swelling, or pain. ? Fluid or blood. ? Warmth. ? Pus or a bad smell.  If you have a drain tube, follow  instructions from your doctor about caring for it. Do not take out the drain tube or any bandages unless your doctor says it is okay. Physical activity  Rest and protect your back as much as possible.  Follow instructions from your doctor about how to move. Use good posture to help your spine heal.  Do not lift anything that is heavier than 8 lb (3.6 kg), or the limit that you are told, until your doctor says that it is safe.  Do not twist or bend at the waist until your doctor says it is okay.  It is best if you: ? Do not make pushing and pulling motions. ? Do not sit or lie down in the same  position for a long time. ? Do not raise your hands or arms above your head.  Return to your normal activities as told by your doctor. Ask your doctor what activities are safe for you. Rest and protect your back as much as you can.  Do not start to exercise until your doctor says it is okay. Ask your doctor what kinds of exercise you can do to make your back stronger. General instructions  To prevent blood clots and lessen swelling in your legs: ? Wear compression stockings as told. ? Walk one or more times every few hours as told by your doctor.  Do not use any products that contain nicotine or tobacco, such as cigarettes and e-cigarettes. These can delay bone healing. If you need help quitting, ask your doctor.  To prevent or treat constipation while you are taking prescription pain medicine, your doctor may suggest that you: ? Drink enough fluid to keep your pee (urine) pale yellow. ? Take over-the-counter or prescription medicines. ? Eat foods that are high in fiber. These include fresh fruits and vegetables, whole grains, and beans. ? Limit foods that are high in fat and processed sugars, such as fried and sweet foods.  Keep all follow-up visits as told by your doctor. This is important. Contact a doctor if:  Your pain gets worse.  Your medicine does not help your pain.  Your legs or feet get painful or swollen.  Your cut from surgery is more red, swollen, or painful.  Your cut from surgery feels warm to the touch.  You have: ? Fluid or blood coming from your cut from surgery. ? Pus or a bad smell coming from your cut from surgery. ? A fever. ? Weakness or loss of feeling (numbness) in your legs that is new or getting worse. ? Trouble controlling when you pee (urinate) or poop (have a bowel movement).  You feel sick to your stomach (nauseous).  You throw up (vomit). Get help right away if:  Your pain is very bad.  You have chest pain.  You have trouble  breathing.  You start to have a cough. These symptoms may be an emergency. Do not wait to see if the symptoms will go away. Get medical help right away. Call your local emergency services (911 in the [place] Do not drive yourself to the hospital. Summary  After the procedure, it is common to have pain in your back and pain by your surgery cut(s).  Icing and pain medicines may help to control the pain. Follow directions from your doctor.  Rest and protect your back as much as possible. Do not twist or bend at the waist.  Get up and walk one or more times every few hours as told by your doctor. This  information is not intended to replace advice given to you by your health care provider. Make sure you discuss any questions you have with your health care provider. Document Released: 06/10/2010 Document Revised: 06/07/2018 Document Reviewed: 05/31/2016 Elsevier Patient Education  [place].

## 2019-01-30 NOTE — Progress Notes (Signed)
Physical Therapy Treatment Patient Details Name: Tammy Mullins MRN: [id number] DOB: 1950-07-08 Today's Date: 01/30/2019    History of Present Illness 68 yo [name] admitted for transforaminal lumbar interbody fusion L3 4 L4-5. PHMx: DM2, chronic pain, fibromylgia, depression, gout, shoulder arthoplasty    PT Comments    Pt con't to demonstrate delayed processing and suspect pt with cognitive delay at baseline. Pt requires max directional verbal cues for all tasks asked, donning of back brace, and to adhere to back precautions. Pt requiring assist for walker mana gement during ambulation. Pt to benefit from HHPT to progress indep mobility. Acute PT to cont to follow.   Follow Up Recommendations  Home health PT;Supervision/Assistance - 24 hour     Equipment Recommendations  Rolling walker with 5" wheels    Recommendations for Other Services       Precautions / Restrictions Precautions Precautions: Back Precaution Booklet Issued: Yes (comment) Precaution Comments: pt able to recall 3/3 precautions with verbal cues Required Braces or Orthoses: Spinal Brace Spinal Brace: Lumbar corset;Applied in sitting position(max directional verbal cues to don it) Restrictions Weight Bearing Restrictions: No    Mobility  Bed Mobility Overal bed mobility: Needs Assistance Bed Mobility: Rolling;Sidelying to Sit;Sit to Sidelying Rolling: Supervision Sidelying to sit: Supervision     Sit to sidelying: Supervision General bed mobility comments: max directional verbal cues to complete log roll technique but no physical assist needed  Transfers Overall transfer level: Needs assistance Equipment used: Rolling walker (2 wheeled) Transfers: Sit to/from Stand Sit to Stand: Min guard         General transfer comment: increased time, verbal cues for safe hand placement/not to pull up on walker  Ambulation/Gait Ambulation/Gait assistance: Min assist Gait Distance (Feet): 100  Feet Assistive device: Rolling walker (2 wheeled);Straight cane Gait Pattern/deviations: Step-through pattern;Decreased stride length Gait velocity: slow Gait velocity interpretation: <1.31 ft/sec, indicative of household ambulator General Gait Details: pt extremely slow and requires minA to push walker forward to obtain a fluid reciprocal gait pattern, verbal cues to decrease UE dependency   Stairs             Wheelchair Mobility    Modified Rankin (Stroke Patients Only)       Balance Overall balance assessment: Needs assistance Sitting-balance support: No upper extremity supported Sitting balance-Leahy Scale: Good     Standing balance support: No upper extremity supported;During functional activity Standing balance-Leahy Scale: Fair Standing balance comment: needs RW for safe amb                            Cognition Arousal/Alertness: Awake/alert Behavior During Therapy: WFL for tasks assessed/performed Overall Cognitive Status: History of cognitive impairments - at baseline(suspect pt with mental delay) Area of Impairment: Problem solving;Safety/judgement;Memory                     Memory: Decreased recall of precautions   Safety/Judgement: Decreased awareness of safety   Problem Solving: Slow processing;Difficulty sequencing;Requires verbal cues;Requires tactile cues;Decreased initiation General Comments: pt requiring verbal cues to adhere to precautions, pt unable to use cane appropriately, will need RW      Exercises      General Comments General comments (skin integrity, edema, etc.): VSS      Pertinent Vitals/Pain Pain Assessment: 0-10 Pain Score: 5  Faces Pain Scale: Hurts little more Pain Location: incisional Pain Descriptors / Indicators: Sore Pain Intervention(s): Monitored during session  Home Living                      Prior Function            PT Goals (current goals can now be found in the care plan  section) Acute Rehab PT Goals Patient Stated Goal: to be able to do for myself again Progress towards PT goals: Progressing toward goals    Frequency    Min 5X/week      PT Plan Current plan remains appropriate    Co-evaluation              AM-PAC PT "6 Clicks" Mobility   Outcome Measure  Help needed turning from your back to your side while in a flat bed without using bedrails?: A Little Help needed moving from lying on your back to sitting on the side of a flat bed without using bedrails?: A Little Help needed moving to and from a bed to a chair (including a wheelchair)?: A Little Help needed standing up from a chair using your arms (e.g., wheelchair or bedside chair)?: A Little Help needed to walk in hospital room?: A Little Help needed climbing 3-5 steps with a railing? : A Little 6 Click Score: 18    End of Session Equipment Utilized During Treatment: Gait belt;Back brace Activity Tolerance: Patient tolerated treatment well Patient left: with call bell/phone within reach(sitting EOB) Nurse Communication: Mobility status PT Visit Diagnosis: Muscle weakness (generalized) (M62.81);Difficulty in walking, not elsewhere classified (R26.2);Pain     Time: 0820-0838 PT Time Calculation (min) (ACUTE ONLY): 18 min  Charges:  $Gait Training: 8-22 mins                     Kittie Plater, PT, DPT Acute Rehabilitation Services Pager #: [phone] Office #: [phone]    Berline Lopes 01/30/2019, 9:53 AM

## 2019-01-30 NOTE — Progress Notes (Signed)
Occupational Therapy Treatment Patient Details Name: Tammy Mullins MRN: [id number] DOB: 12/22/1950 Today's Date: 01/30/2019    History of present illness 68 yo Tammy Mullins admitted for transforaminal lumbar interbody fusion L3 4 L4-5. PHMx: DM2, chronic pain, fibromylgia, depression, gout, shoulder arthoplasty   OT comments  Patient seated EOB upon entry.  Patient requires cueing to adhere functionally to back precautions during session, but able to recall 3/3 initially (although requires min cueing to recall lifting at end of session).  Patient currently requires mod assist for dressing and min assist for UB dressing (with mod cueing for brace mgmt).  Pt with poor immediate recall of LB dressing techniques for back precautions, but reports she will have 24/7 support. Also required cueing to utilize RW for transfer/mobility in room.  Pt will require 24/7 support at dc, recommend HHOT (updated plan) to optimize independence and safety with ADLs/IADLs at dc.     Follow Up Recommendations  Home health OT;Supervision/Assistance - 24 hour    Equipment Recommendations  None recommended by OT    Recommendations for Other Services      Precautions / Restrictions Precautions Precautions: Back Precaution Booklet Issued: Yes (comment) Precaution Comments: pt able to recall 3/3 precautions initally, 2/3 at completion of session; min cueing throughout ADLs to adhere functionally Required Braces or Orthoses: Spinal Brace Spinal Brace: Lumbar corset;Applied in sitting position       Mobility Bed Mobility               General bed mobility comments: EOB upon entry  Transfers Overall transfer level: Needs assistance   Transfers: Sit to/from Stand Sit to Stand: Min guard         General transfer comment: incrased time and cueing for hand placement/body mechanics    Balance Overall balance assessment: Needs assistance Sitting-balance support: No upper extremity supported Sitting  balance-Leahy Scale: Good     Standing balance support: No upper extremity supported;During functional activity Standing balance-Leahy Scale: Fair Standing balance comment: standing during ADLs without UE support min guard                           ADL either performed or assessed with clinical judgement   ADL Overall ADL's : Needs assistance/impaired                 Upper Body Dressing : Minimal assistance Upper Body Dressing Details (indicate cue type and reason): for brace Lower Body Dressing: Moderate assistance;Sit to/from stand;Cueing for compensatory techniques;Cueing for back precautions Lower Body Dressing Details (indicate cue type and reason): able to cross legs today, but continues to require assist with reaching toes to don socks and thread clothing; cueing for precautions during session  Toilet Transfer: Min guard;Ambulation;RW Toilet Transfer Details (indicate cue type and reason): simulated to recliner          Functional mobility during ADLs: Rolling walker;Min guard General ADL Comments: reports 24/7 support at home      Vision       Perception     Praxis      Cognition Arousal/Alertness: Awake/alert Behavior During Therapy: WFL for tasks assessed/performed Overall Cognitive Status: History of cognitive impairments - at baseline Area of Impairment: Problem solving;Safety/judgement;Memory                     Memory: Decreased recall of precautions   Safety/Judgement: (poor recall of precautions functionally)   Problem Solving: Slow processing;Difficulty sequencing;Requires verbal  cues;Requires tactile cues;Decreased initiation General Comments: pt requires cueing for back precautions functionally, able to recall 3/3 initally and min cueing to recall lift precaution at end of session; reports will have 24/7 support; poor problem solving and immediate recall of ADL compensatory techniques        Exercises     Shoulder  Instructions       General Comments      Pertinent Vitals/ Pain       Pain Assessment: Faces Faces Pain Scale: Hurts little more Pain Location: incisional Pain Descriptors / Indicators: Aching;Operative site guarding;Sore Pain Intervention(s): Monitored during session;Repositioned  Home Living                                          Prior Functioning/Environment              Frequency  Min 2X/week        Progress Toward Goals  OT Goals(current goals can now be found in the care plan section)  Progress towards OT goals: Progressing toward goals  Acute Rehab OT Goals Patient Stated Goal: to be able to do for myself again OT Goal Formulation: With patient  Plan Frequency remains appropriate;Discharge plan needs to be updated    Co-evaluation                 AM-PAC OT "6 Clicks" Daily Activity     Outcome Measure   Help from another person eating meals?: None Help from another person taking care of personal grooming?: A Little Help from another person toileting, which includes using toliet, bedpan, or urinal?: A Little Help from another person bathing (including washing, rinsing, drying)?: A Lot Help from another person to put on and taking off regular upper body clothing?: A Little Help from another person to put on and taking off regular lower body clothing?: A Lot 6 Click Score: 17    End of Session Equipment Utilized During Treatment: Rolling walker;Back brace  OT Visit Diagnosis: Unsteadiness on feet (R26.81);Other abnormalities of gait and mobility (R26.89);Muscle weakness (generalized) (M62.81);Pain Pain - part of body: (back, incisional)   Activity Tolerance Patient tolerated treatment well   Patient Left in chair;with call bell/phone within reach   Nurse Communication Mobility status        Time: [id number] OT Time Calculation (min): 30 min  Charges: OT General Charges $OT Visit: 1 Visit OT Treatments $Self  Care/Home Management : 23-37 mins  Delight Stare, [place] Pager [phone] Office [phone]     Delight Stare 01/30/2019, 9:22 AM

## 2019-01-30 NOTE — Plan of Care (Signed)
Pt given D/C instructions with verbal understanding. Rx's were sent to pharmacy by MD. Pt's incision is clean and dry with no sign of infection. Pt's IV was removed prior to D/C. Pt's Home Health was set up by CM prior to D/C. Pt received RW per MD order. Pt D/C'd home via wheelchair per MD order. Pt is stable @ D/C and has no other needs at this time. Holli Humbles, RN

## 2019-01-31 ENCOUNTER — Other Ambulatory Visit: Payer: Self-pay | Admitting: Physician Assistant

## 2019-01-31 DIAGNOSIS — M797 Fibromyalgia: Secondary | ICD-10-CM | POA: Diagnosis not present

## 2019-01-31 DIAGNOSIS — M109 Gout, unspecified: Secondary | ICD-10-CM | POA: Diagnosis not present

## 2019-01-31 DIAGNOSIS — E1122 Type 2 diabetes mellitus with diabetic chronic kidney disease: Secondary | ICD-10-CM | POA: Diagnosis not present

## 2019-01-31 DIAGNOSIS — K5730 Diverticulosis of large intestine without perforation or abscess without bleeding: Secondary | ICD-10-CM | POA: Diagnosis not present

## 2019-01-31 DIAGNOSIS — M1990 Unspecified osteoarthritis, unspecified site: Secondary | ICD-10-CM | POA: Diagnosis not present

## 2019-01-31 DIAGNOSIS — Z4789 Encounter for other orthopedic aftercare: Secondary | ICD-10-CM | POA: Diagnosis not present

## 2019-01-31 DIAGNOSIS — K589 Irritable bowel syndrome without diarrhea: Secondary | ICD-10-CM | POA: Diagnosis not present

## 2019-01-31 DIAGNOSIS — E7800 Pure hypercholesterolemia, unspecified: Secondary | ICD-10-CM | POA: Diagnosis not present

## 2019-01-31 DIAGNOSIS — K2270 Barrett's esophagus without dysplasia: Secondary | ICD-10-CM | POA: Diagnosis not present

## 2019-01-31 DIAGNOSIS — I129 Hypertensive chronic kidney disease with stage 1 through stage 4 chronic kidney disease, or unspecified chronic kidney disease: Secondary | ICD-10-CM | POA: Diagnosis not present

## 2019-01-31 DIAGNOSIS — G894 Chronic pain syndrome: Secondary | ICD-10-CM | POA: Diagnosis not present

## 2019-01-31 DIAGNOSIS — N189 Chronic kidney disease, unspecified: Secondary | ICD-10-CM | POA: Diagnosis not present

## 2019-01-31 DIAGNOSIS — M4316 Spondylolisthesis, lumbar region: Secondary | ICD-10-CM | POA: Diagnosis not present

## 2019-02-06 ENCOUNTER — Encounter: Payer: Self-pay | Admitting: Physician Assistant

## 2019-02-06 ENCOUNTER — Ambulatory Visit (INDEPENDENT_AMBULATORY_CARE_PROVIDER_SITE_OTHER): Payer: PPO | Admitting: Physician Assistant

## 2019-02-06 ENCOUNTER — Other Ambulatory Visit: Payer: Self-pay

## 2019-02-06 DIAGNOSIS — F3181 Bipolar II disorder: Secondary | ICD-10-CM | POA: Diagnosis not present

## 2019-02-06 NOTE — Progress Notes (Signed)
[facility]  Patient ID: Ki Bayona,  MRN: [id number]  PCP: Maury Dus, MD  Date of Evaluation: 02/06/2019 Time spent:15 minutes  Chief Complaint:  Chief Complaint    Follow-up     Virtual Visit via Telephone Note  I connected with patient by a video enabled telemedicine application or telephone, with their informed consent, and verified patient privacy and that I am speaking with the correct person using two identifiers.  I am private, in my office and the patient is home.  I discussed the limitations, risks, security and privacy concerns of performing an evaluation and management service by telephone and the availability of in person appointments. I also discussed with the patient that there may be a patient responsible charge related to this service. The patient expressed understanding and agreed to proceed.   I discussed the assessment and treatment plan with the patient. The patient was provided an opportunity to ask questions and all were answered. The patient agreed with the plan and demonstrated an understanding of the instructions.   The patient was advised to call back or seek an in-person evaluation if the symptoms worsen or if the condition fails to improve as anticipated.  I provided 15 minutes of non-face-to-face time during this encounter.  HISTORY/CURRENT STATUS: HPI For routine med check.   Had back surgery last week. Had spondylolisthesis of L3, 4, 5.  She's doing really well.  Has 'graduated' from the walker already!  Getting home health PT.  She lives alone w/ her cat, and has a lot of family that has helped her. Immediately after the surgery, she felt depressed for a few days, but is getting better.   Patient denies loss of interest in usual activities and is able to enjoy things.  Denies decreased energy or motivation.  Appetite has not changed.  No extreme sadness, tearfulness, or feelings of hopelessness.  Denies any changes in  concentration, making decisions or remembering things.  Denies suicidal or homicidal thoughts.  Patient denies increased energy with decreased need for sleep, no increased talkativeness, no racing thoughts, no impulsivity or risky behaviors, no increased spending, no increased libido, no grandiosity.  Denies dizziness, syncope, seizures, numbness, tingling, tremor, tics, unsteady gait, slurred speech, confusion. Denies muscle or joint pain, stiffness, or dystonia.  Individual Medical History/ Review of Systems: Changes? :Yes Had back surgery 01/28/2019   Allergies: Crestor [rosuvastatin calcium], Asa [aspirin], and Celebrex [celecoxib]  Current Medications:  Current Outpatient Medications:  .  allopurinol (ZYLOPRIM) 300 MG tablet, TAKE 1 AND 1/2 TABLETS(450 MG) BY MOUTH DAILY, Disp: 45 tablet, Rfl: 2 .  amLODipine (NORVASC) 10 MG tablet, TAKE 1 TABLET DAILY, Disp: 90 tablet, Rfl: 1 .  aspirin 81 MG tablet, Take 81 mg by mouth daily.  , Disp: , Rfl:  .  b complex vitamins capsule, Take 1 capsule by mouth daily., Disp: 60 capsule, Rfl: 11 .  butalbital-acetaminophen-caffeine (FIORICET) 50-325-40 MG tablet, Take 1 tablet by mouth once as needed for up to 1 dose for headache. Once a, Disp: 12 tablet, Rfl: 5 .  calcium carbonate (OSCAL) 1500 (600 Ca) MG TABS tablet, Take 600 mg of elemental calcium by mouth daily with breakfast., Disp: , Rfl:  .  CALCIUM-MAGNESIUM-ZINC PO, Take by mouth daily., Disp: , Rfl:  .  Cholecalciferol (VITAMIN D) 2000 UNITS CAPS, Take 2,000 Units by mouth daily. , Disp: , Rfl:  .  cholecalciferol (VITAMIN D3) 25 MCG (1000 UT) tablet, Take 2,000 Units by mouth daily., Disp: , Rfl:  .  diclofenac sodium (VOLTAREN) 1 % GEL, APPLY 2-4 GRAMS TO AFFECTED  JOINTS UP TO FOUR TIMES DAILY (Patient taking differently: Apply 1 application topically 4 (four) times daily. APPLY 2-4 GRAMS TO AFFECTED  JOINTS UP TO FOUR TIMES DAILY), Disp: 400 g, Rfl: 2 .  dicyclomine (BENTYL) 20 MG  tablet, Take 1 tablet (20 mg total) by mouth 3 (three) times daily as needed., Disp: 90 tablet, Rfl: 0 .  DULoxetine (CYMBALTA) 60 MG capsule, TAKE 2 CAPSULES BY MOUTH EVERY DAY, Disp: 180 capsule, Rfl: 0 .  ferrous sulfate 325 (65 FE) MG tablet, Take 1 tablet (325 mg total) by mouth 2 (two) times daily with a meal., Disp: , Rfl: 3 .  fluticasone (FLONASE) 50 MCG/ACT nasal spray, Place 2 sprays into both nostrils at bedtime. (Patient taking differently: Place 2 sprays into both nostrils at bedtime as needed for allergies. ), Disp: 48 g, Rfl: 1 .  gabapentin (NEURONTIN) 600 MG tablet, TAKE 1 TABLET BY MOUTH AT BEDTIME, Disp: 30 tablet, Rfl: 0 .  Garlic [id number] MG CAPS, Take 300 mg by mouth daily. , Disp: , Rfl:  .  hydrochlorothiazide (HYDRODIURIL) 25 MG tablet, Take 1 tablet (25 mg total) by mouth daily., Disp: 30 tablet, Rfl: 2 .  HYDROcodone-acetaminophen (NORCO/VICODIN) 5-325 MG tablet, Take 1-2 tablets by mouth every 4 (four) hours as needed for moderate pain., Disp: 20 tablet, Rfl: 0 .  losartan (COZAAR) 100 MG tablet, Take 1 tablet (100 mg total) by mouth daily., Disp: 30 tablet, Rfl: 5 .  methocarbamol (ROBAXIN) 500 MG tablet, Take 1 tablet (500 mg total) by mouth 4 (four) times daily., Disp: 45 tablet, Rfl: 0 .  metoprolol succinate (TOPROL-XL) 100 MG 24 hr tablet, TAKE 1 TABLET DAILY, Disp: 90 tablet, Rfl: 1 .  omeprazole (PRILOSEC) 20 MG capsule, Take 20 mg by mouth daily., Disp: , Rfl:  .  QUEtiapine (SEROQUEL) 300 MG tablet, Take 1 tablet (300 mg total) by mouth at bedtime., Disp: 90 tablet, Rfl: 0 .  raloxifene (EVISTA) 60 MG tablet, Take 60 mg by mouth daily. , Disp: , Rfl:  .  topiramate (TOPAMAX) 50 MG tablet, Take 1 tablet (50 mg total) by mouth 2 (two) times daily., Disp: 60 tablet, Rfl: 6 Medication Side Effects: none  Family Medical/ Social History: Changes? No  MENTAL HEALTH EXAM:  There were no vitals taken for this visit.There is no height or weight on file to calculate BMI.   General Appearance: unable to assess  Eye Contact:  unable to assess  Speech:  Clear and Coherent  Volume:  Normal  Mood:  Euthymic  Affect:  unable to assess  Thought Process:  Goal Directed  Orientation:  Full (Time, Place, and Person)  Thought Content: Logical   Suicidal Thoughts:  No  Homicidal Thoughts:  No  Memory:  WNL  Judgement:  Good  Insight:  Good  Psychomotor Activity:  unable to assess  Concentration:  Concentration: Good  Recall:  Good  Fund of Knowledge: Good  Language: Good  Assets:  Desire for Improvement  ADL's:  Intact  Cognition: WNL  Prognosis:  Good    DIAGNOSES:    ICD-10-CM   1. Bipolar 2 disorder ([place])  F31.81     Receiving Psychotherapy: No    RECOMMENDATIONS:  PDMP reviewed. I think the depression she experienced is probably d/t the surgery/ anesthesia and it should continue to improve over time.  If it does not within a few weeks, give me a call. Cont  Cymbalta 60 mg, 2 qd. Cont Seroquel 300 mg qhs.  Return in 3 months.  Donnal Moat, PA-C

## 2019-02-07 ENCOUNTER — Other Ambulatory Visit: Payer: PPO

## 2019-02-18 ENCOUNTER — Other Ambulatory Visit: Payer: Self-pay | Admitting: Physician Assistant

## 2019-02-18 ENCOUNTER — Telehealth: Payer: Self-pay

## 2019-02-18 MED ORDER — GABAPENTIN 600 MG PO TABS: 600.0000 mg | ORAL_TABLET | Freq: Every day | ORAL | 0 refills | Status: DC

## 2019-02-18 NOTE — Telephone Encounter (Signed)
Refill request received via fax from [facility] on [address] for gabapentin.   Last Visit: 01/22/2019 telemedicine  Next Visit: 07/23/2019  Okay to refill per Dr. Estanislado Pandy.

## 2019-02-25 ENCOUNTER — Other Ambulatory Visit: Payer: PPO

## 2019-02-26 ENCOUNTER — Other Ambulatory Visit: Payer: Self-pay

## 2019-02-26 ENCOUNTER — Ambulatory Visit
Admission: RE | Admit: 2019-02-26 | Discharge: 2019-02-26 | Disposition: A | Discharge status: Home / Self Care | Payer: PPO | Source: Ambulatory Visit | Attending: Family Medicine | Admitting: Family Medicine

## 2019-02-26 DIAGNOSIS — Z1231 Encounter for screening mammogram for malignant neoplasm of breast: Secondary | ICD-10-CM

## 2019-03-08 DIAGNOSIS — K5790 Diverticulosis of intestine, part unspecified, without perforation or abscess without bleeding: Secondary | ICD-10-CM | POA: Diagnosis not present

## 2019-03-08 DIAGNOSIS — M8589 Other specified disorders of bone density and structure, multiple sites: Secondary | ICD-10-CM | POA: Diagnosis not present

## 2019-03-08 DIAGNOSIS — F419 Anxiety disorder, unspecified: Secondary | ICD-10-CM | POA: Diagnosis not present

## 2019-03-08 DIAGNOSIS — M4316 Spondylolisthesis, lumbar region: Secondary | ICD-10-CM | POA: Diagnosis not present

## 2019-03-08 DIAGNOSIS — Z7982 Long term (current) use of aspirin: Secondary | ICD-10-CM | POA: Diagnosis not present

## 2019-03-08 DIAGNOSIS — F5101 Primary insomnia: Secondary | ICD-10-CM | POA: Diagnosis not present

## 2019-03-08 DIAGNOSIS — G43909 Migraine, unspecified, not intractable, without status migrainosus: Secondary | ICD-10-CM | POA: Diagnosis not present

## 2019-03-08 DIAGNOSIS — M19041 Primary osteoarthritis, right hand: Secondary | ICD-10-CM | POA: Diagnosis not present

## 2019-03-08 DIAGNOSIS — M797 Fibromyalgia: Secondary | ICD-10-CM | POA: Diagnosis not present

## 2019-03-08 DIAGNOSIS — F329 Major depressive disorder, single episode, unspecified: Secondary | ICD-10-CM | POA: Diagnosis not present

## 2019-03-08 DIAGNOSIS — M1A09X0 Idiopathic chronic gout, multiple sites, without tophus (tophi): Secondary | ICD-10-CM | POA: Diagnosis not present

## 2019-03-08 DIAGNOSIS — M19042 Primary osteoarthritis, left hand: Secondary | ICD-10-CM | POA: Diagnosis not present

## 2019-03-08 DIAGNOSIS — Z981 Arthrodesis status: Secondary | ICD-10-CM | POA: Diagnosis not present

## 2019-03-08 DIAGNOSIS — M47816 Spondylosis without myelopathy or radiculopathy, lumbar region: Secondary | ICD-10-CM | POA: Diagnosis not present

## 2019-03-08 DIAGNOSIS — I10 Essential (primary) hypertension: Secondary | ICD-10-CM | POA: Diagnosis not present

## 2019-03-08 DIAGNOSIS — G894 Chronic pain syndrome: Secondary | ICD-10-CM | POA: Diagnosis not present

## 2019-03-08 DIAGNOSIS — Z9181 History of falling: Secondary | ICD-10-CM | POA: Diagnosis not present

## 2019-03-08 DIAGNOSIS — M48061 Spinal stenosis, lumbar region without neurogenic claudication: Secondary | ICD-10-CM | POA: Diagnosis not present

## 2019-03-12 DIAGNOSIS — M4316 Spondylolisthesis, lumbar region: Secondary | ICD-10-CM | POA: Diagnosis not present

## 2019-03-18 ENCOUNTER — Ambulatory Visit: Payer: Self-pay | Admitting: Neurology

## 2019-03-19 DIAGNOSIS — F329 Major depressive disorder, single episode, unspecified: Secondary | ICD-10-CM | POA: Diagnosis not present

## 2019-03-19 DIAGNOSIS — M48061 Spinal stenosis, lumbar region without neurogenic claudication: Secondary | ICD-10-CM | POA: Diagnosis not present

## 2019-03-19 DIAGNOSIS — M19041 Primary osteoarthritis, right hand: Secondary | ICD-10-CM | POA: Diagnosis not present

## 2019-03-19 DIAGNOSIS — F419 Anxiety disorder, unspecified: Secondary | ICD-10-CM | POA: Diagnosis not present

## 2019-03-19 DIAGNOSIS — I10 Essential (primary) hypertension: Secondary | ICD-10-CM | POA: Diagnosis not present

## 2019-03-19 DIAGNOSIS — Z9181 History of falling: Secondary | ICD-10-CM | POA: Diagnosis not present

## 2019-03-19 DIAGNOSIS — M797 Fibromyalgia: Secondary | ICD-10-CM | POA: Diagnosis not present

## 2019-03-19 DIAGNOSIS — M47816 Spondylosis without myelopathy or radiculopathy, lumbar region: Secondary | ICD-10-CM | POA: Diagnosis not present

## 2019-03-19 DIAGNOSIS — M1A09X0 Idiopathic chronic gout, multiple sites, without tophus (tophi): Secondary | ICD-10-CM | POA: Diagnosis not present

## 2019-03-19 DIAGNOSIS — G894 Chronic pain syndrome: Secondary | ICD-10-CM | POA: Diagnosis not present

## 2019-03-19 DIAGNOSIS — M8589 Other specified disorders of bone density and structure, multiple sites: Secondary | ICD-10-CM | POA: Diagnosis not present

## 2019-03-19 DIAGNOSIS — Z981 Arthrodesis status: Secondary | ICD-10-CM | POA: Diagnosis not present

## 2019-03-19 DIAGNOSIS — M19042 Primary osteoarthritis, left hand: Secondary | ICD-10-CM | POA: Diagnosis not present

## 2019-03-19 DIAGNOSIS — G43909 Migraine, unspecified, not intractable, without status migrainosus: Secondary | ICD-10-CM | POA: Diagnosis not present

## 2019-03-19 DIAGNOSIS — Z7982 Long term (current) use of aspirin: Secondary | ICD-10-CM | POA: Diagnosis not present

## 2019-03-19 DIAGNOSIS — K5790 Diverticulosis of intestine, part unspecified, without perforation or abscess without bleeding: Secondary | ICD-10-CM | POA: Diagnosis not present

## 2019-03-19 DIAGNOSIS — F5101 Primary insomnia: Secondary | ICD-10-CM | POA: Diagnosis not present

## 2019-03-19 DIAGNOSIS — M4316 Spondylolisthesis, lumbar region: Secondary | ICD-10-CM | POA: Diagnosis not present

## 2019-03-25 DIAGNOSIS — M25561 Pain in right knee: Secondary | ICD-10-CM | POA: Diagnosis not present

## 2019-03-25 DIAGNOSIS — M25562 Pain in left knee: Secondary | ICD-10-CM | POA: Diagnosis not present

## 2019-03-26 ENCOUNTER — Telehealth: Payer: Self-pay | Admitting: Neurology

## 2019-03-26 NOTE — Telephone Encounter (Signed)
Pt is asking for a call from RN to discuss her head aches that come and go.  Pt states one today has been constant all day.  Pt has scheduled a f/u with NP but would like a call from RN re: any suggestions while she waits to be seen

## 2019-03-26 NOTE — Telephone Encounter (Signed)
I called the patient back. She is not taking the topiramate for prevention and she has not tried the Fioricet for pain. She has prescriptions for both medications at the pharmacy. She is going to restart the topiramate 50mg , one tab QHS x one week then increase to BID. She also understands to use the Fioricet sparingly for her moderate to severe headaches. She is provided with #12 per month. She will also get this picked up from the pharmacy. She has scheduled her MRI brain on 03/27/19. She has a pending appt on 05/21/19. She understands that she will be contacted about her MRI results. Provided our number to call back with any further concerns.  Reports she got off track with her headache treatment due to having recent back surgery.

## 2019-03-27 ENCOUNTER — Ambulatory Visit
Admission: RE | Admit: 2019-03-27 | Discharge: 2019-03-27 | Disposition: A | Discharge status: Home / Self Care | Payer: PPO | Source: Ambulatory Visit | Attending: Neurology | Admitting: Neurology

## 2019-03-27 DIAGNOSIS — R519 Headache, unspecified: Secondary | ICD-10-CM | POA: Diagnosis not present

## 2019-03-28 ENCOUNTER — Telehealth: Payer: Self-pay | Admitting: Neurology

## 2019-03-28 NOTE — Telephone Encounter (Signed)
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   Please call patient, MRI of the brain showed mild age-related changes, mild supratentorium small vessel disease, there was no acute abnormalities

## 2019-03-28 NOTE — Telephone Encounter (Signed)
I spoke to the patient and she verbalized understanding of her MRI results.

## 2019-04-04 DIAGNOSIS — G894 Chronic pain syndrome: Secondary | ICD-10-CM | POA: Diagnosis not present

## 2019-04-04 DIAGNOSIS — M8589 Other specified disorders of bone density and structure, multiple sites: Secondary | ICD-10-CM | POA: Diagnosis not present

## 2019-04-04 DIAGNOSIS — M19041 Primary osteoarthritis, right hand: Secondary | ICD-10-CM | POA: Diagnosis not present

## 2019-04-04 DIAGNOSIS — M48061 Spinal stenosis, lumbar region without neurogenic claudication: Secondary | ICD-10-CM | POA: Diagnosis not present

## 2019-04-04 DIAGNOSIS — M4316 Spondylolisthesis, lumbar region: Secondary | ICD-10-CM | POA: Diagnosis not present

## 2019-04-04 DIAGNOSIS — M1A09X0 Idiopathic chronic gout, multiple sites, without tophus (tophi): Secondary | ICD-10-CM | POA: Diagnosis not present

## 2019-04-04 DIAGNOSIS — K5790 Diverticulosis of intestine, part unspecified, without perforation or abscess without bleeding: Secondary | ICD-10-CM | POA: Diagnosis not present

## 2019-04-04 DIAGNOSIS — M47816 Spondylosis without myelopathy or radiculopathy, lumbar region: Secondary | ICD-10-CM | POA: Diagnosis not present

## 2019-04-04 DIAGNOSIS — Z9181 History of falling: Secondary | ICD-10-CM | POA: Diagnosis not present

## 2019-04-04 DIAGNOSIS — Z981 Arthrodesis status: Secondary | ICD-10-CM | POA: Diagnosis not present

## 2019-04-04 DIAGNOSIS — F419 Anxiety disorder, unspecified: Secondary | ICD-10-CM | POA: Diagnosis not present

## 2019-04-04 DIAGNOSIS — Z7982 Long term (current) use of aspirin: Secondary | ICD-10-CM | POA: Diagnosis not present

## 2019-04-04 DIAGNOSIS — I10 Essential (primary) hypertension: Secondary | ICD-10-CM | POA: Diagnosis not present

## 2019-04-04 DIAGNOSIS — F5101 Primary insomnia: Secondary | ICD-10-CM | POA: Diagnosis not present

## 2019-04-04 DIAGNOSIS — M797 Fibromyalgia: Secondary | ICD-10-CM | POA: Diagnosis not present

## 2019-04-04 DIAGNOSIS — F329 Major depressive disorder, single episode, unspecified: Secondary | ICD-10-CM | POA: Diagnosis not present

## 2019-04-04 DIAGNOSIS — M19042 Primary osteoarthritis, left hand: Secondary | ICD-10-CM | POA: Diagnosis not present

## 2019-04-04 DIAGNOSIS — G43909 Migraine, unspecified, not intractable, without status migrainosus: Secondary | ICD-10-CM | POA: Diagnosis not present

## 2019-04-08 DIAGNOSIS — M25561 Pain in right knee: Secondary | ICD-10-CM | POA: Diagnosis not present

## 2019-04-11 DIAGNOSIS — M4316 Spondylolisthesis, lumbar region: Secondary | ICD-10-CM | POA: Diagnosis not present

## 2019-04-15 NOTE — Telephone Encounter (Signed)
Per vo by Dr. Krista Blue, she may stop the topiramate. She would like to move forward with getting the patient approved for Botox. The patient is agreeable with this plan. She is having headaches daily in varying degrees. She estimates migraines at least four times per week.

## 2019-04-15 NOTE — Addendum Note (Signed)
Addended by: Desmond Lope on: 04/15/2019 03:24 PM   Modules accepted: Orders

## 2019-04-15 NOTE — Telephone Encounter (Signed)
Pt has called to report that the topiramate (TOPAMAX) 50 MG tablet is not toucher her headaches.  Pt states that this medication is also giving her loose bowels.  Pt states her diarrhea is worsening.  Please call

## 2019-04-17 ENCOUNTER — Other Ambulatory Visit: Payer: Self-pay | Admitting: Physician Assistant

## 2019-04-23 ENCOUNTER — Telehealth: Payer: Self-pay | Admitting: Neurology

## 2019-04-23 DIAGNOSIS — M797 Fibromyalgia: Secondary | ICD-10-CM | POA: Diagnosis not present

## 2019-04-23 DIAGNOSIS — M47816 Spondylosis without myelopathy or radiculopathy, lumbar region: Secondary | ICD-10-CM | POA: Diagnosis not present

## 2019-04-23 DIAGNOSIS — M19042 Primary osteoarthritis, left hand: Secondary | ICD-10-CM | POA: Diagnosis not present

## 2019-04-23 DIAGNOSIS — G43909 Migraine, unspecified, not intractable, without status migrainosus: Secondary | ICD-10-CM | POA: Diagnosis not present

## 2019-04-23 DIAGNOSIS — M4316 Spondylolisthesis, lumbar region: Secondary | ICD-10-CM | POA: Diagnosis not present

## 2019-04-23 DIAGNOSIS — Z981 Arthrodesis status: Secondary | ICD-10-CM | POA: Diagnosis not present

## 2019-04-23 DIAGNOSIS — M48061 Spinal stenosis, lumbar region without neurogenic claudication: Secondary | ICD-10-CM | POA: Diagnosis not present

## 2019-04-23 DIAGNOSIS — M19041 Primary osteoarthritis, right hand: Secondary | ICD-10-CM | POA: Diagnosis not present

## 2019-04-23 DIAGNOSIS — F329 Major depressive disorder, single episode, unspecified: Secondary | ICD-10-CM | POA: Diagnosis not present

## 2019-04-23 DIAGNOSIS — K5790 Diverticulosis of intestine, part unspecified, without perforation or abscess without bleeding: Secondary | ICD-10-CM | POA: Diagnosis not present

## 2019-04-23 DIAGNOSIS — Z7982 Long term (current) use of aspirin: Secondary | ICD-10-CM | POA: Diagnosis not present

## 2019-04-23 DIAGNOSIS — F5101 Primary insomnia: Secondary | ICD-10-CM | POA: Diagnosis not present

## 2019-04-23 DIAGNOSIS — F419 Anxiety disorder, unspecified: Secondary | ICD-10-CM | POA: Diagnosis not present

## 2019-04-23 DIAGNOSIS — Z9181 History of falling: Secondary | ICD-10-CM | POA: Diagnosis not present

## 2019-04-23 DIAGNOSIS — G894 Chronic pain syndrome: Secondary | ICD-10-CM | POA: Diagnosis not present

## 2019-04-23 DIAGNOSIS — M8589 Other specified disorders of bone density and structure, multiple sites: Secondary | ICD-10-CM | POA: Diagnosis not present

## 2019-04-23 DIAGNOSIS — I10 Essential (primary) hypertension: Secondary | ICD-10-CM | POA: Diagnosis not present

## 2019-04-23 DIAGNOSIS — M1A09X0 Idiopathic chronic gout, multiple sites, without tophus (tophi): Secondary | ICD-10-CM | POA: Diagnosis not present

## 2019-04-23 NOTE — Telephone Encounter (Signed)
Pt called stating that she called last week about her insurance needing a PA for her morphine shot and she has yet to here from someone about this. Please advise.

## 2019-04-23 NOTE — Telephone Encounter (Signed)
The patient wanted to check on having Botox injections.

## 2019-04-24 ENCOUNTER — Telehealth: Payer: Self-pay

## 2019-04-24 NOTE — Telephone Encounter (Signed)
She has been using frequent OTC NSAIDS for headaches. I instructed her to back off of these type of medications. She should try not to medicate the milder headaches and use the prescribed Fioricet for the more severe pain. She verbalized understanding.  Her Botox has been approved and the appt has been moved up to 04/29/19.

## 2019-04-24 NOTE — Telephone Encounter (Signed)
Patient wants to speak with the nurse about what she can take in the mean time to help with migraines, please call and advise. DWD

## 2019-04-24 NOTE — Telephone Encounter (Signed)
I called [employer] and spoke with representative who stated that codes [phone] and [phone] are covered services with [place] [id number]

## 2019-04-24 NOTE — Telephone Encounter (Signed)
I called and scheduled the patient for Botox injections in first availability apt. DWD

## 2019-04-29 ENCOUNTER — Other Ambulatory Visit: Payer: Self-pay

## 2019-04-29 ENCOUNTER — Ambulatory Visit: Payer: PPO | Admitting: Neurology

## 2019-04-29 ENCOUNTER — Encounter: Payer: Self-pay | Admitting: *Deleted

## 2019-04-29 VITALS — BP 150/79 | HR 89 | Temp 96.9°F | Ht 65.5 in | Wt 176.4 lb

## 2019-04-29 DIAGNOSIS — Z79899 Other long term (current) drug therapy: Secondary | ICD-10-CM | POA: Insufficient documentation

## 2019-04-29 DIAGNOSIS — Z87828 Personal history of other (healed) physical injury and trauma: Secondary | ICD-10-CM | POA: Diagnosis not present

## 2019-04-29 DIAGNOSIS — R519 Headache, unspecified: Secondary | ICD-10-CM | POA: Diagnosis not present

## 2019-04-29 DIAGNOSIS — G43709 Chronic migraine without aura, not intractable, without status migrainosus: Secondary | ICD-10-CM | POA: Diagnosis not present

## 2019-04-29 MED ORDER — ONABOTULINUMTOXINA 100 UNITS IJ SOLR
200.0000 [IU] | Freq: Once | INTRAMUSCULAR | Status: AC
  Administered 2019-04-29: 200 [IU] via INTRAMUSCULAR
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
## 2019-04-29 NOTE — Progress Notes (Signed)
**  Botox 200 units x 1 vial, N[facility] [id number], Lot [id number], Exp 11/2021, office supply, 1st injection.//mck,rn**

## 2019-05-07 ENCOUNTER — Ambulatory Visit: Payer: PPO | Admitting: Physician Assistant

## 2019-05-09 ENCOUNTER — Telehealth: Payer: Self-pay | Admitting: Neurology

## 2019-05-09 DIAGNOSIS — F324 Major depressive disorder, single episode, in partial remission: Secondary | ICD-10-CM | POA: Diagnosis not present

## 2019-05-09 DIAGNOSIS — I10 Essential (primary) hypertension: Secondary | ICD-10-CM | POA: Diagnosis not present

## 2019-05-09 DIAGNOSIS — E7800 Pure hypercholesterolemia, unspecified: Secondary | ICD-10-CM | POA: Diagnosis not present

## 2019-05-09 DIAGNOSIS — D500 Iron deficiency anemia secondary to blood loss (chronic): Secondary | ICD-10-CM | POA: Diagnosis not present

## 2019-05-09 DIAGNOSIS — M8580 Other specified disorders of bone density and structure, unspecified site: Secondary | ICD-10-CM | POA: Diagnosis not present

## 2019-05-09 DIAGNOSIS — D509 Iron deficiency anemia, unspecified: Secondary | ICD-10-CM | POA: Diagnosis not present

## 2019-05-09 NOTE — Telephone Encounter (Signed)
Agree above recommendation

## 2019-05-09 NOTE — Telephone Encounter (Signed)
Spoke with patient who stated she's taken ibuprofen 100 mg with little relief. She doesn't want to take more than 100 mg, stated "it messes with her stomach".  She has tried Tylenol 500 mg,  2 tabs which gives some relief but she hasn' t taken it regularly. She had her 2nd Covid vaccine yesterday, stated she had slight headache when she went. Her headache is worse today. I advised she take Tylenol 1000 mg every 6 hours to see if she gets relief; headache may be worse due to vaccine. I advised she stay well hydrated. She will try that to see if she gets relief. I advised I;ll let Dr Krista Blue know and call her if she has any further recommendations. Patient verbalized understanding, appreciation.

## 2019-05-09 NOTE — Telephone Encounter (Signed)
Pt called stating that the injections she received for her bad headaches slowed down the pain for like 4 days but now its back and its worse. Please advise.

## 2019-05-20 ENCOUNTER — Encounter: Payer: Self-pay | Admitting: Physician Assistant

## 2019-05-20 ENCOUNTER — Ambulatory Visit (INDEPENDENT_AMBULATORY_CARE_PROVIDER_SITE_OTHER): Payer: PPO | Admitting: Physician Assistant

## 2019-05-20 ENCOUNTER — Other Ambulatory Visit: Payer: Self-pay

## 2019-05-20 DIAGNOSIS — F3181 Bipolar II disorder: Secondary | ICD-10-CM

## 2019-05-20 MED ORDER — DULOXETINE HCL 60 MG PO CPEP: 120.0000 mg | ORAL_CAPSULE | Freq: Every day | ORAL | 1 refills | Status: DC

## 2019-05-20 MED ORDER — QUETIAPINE FUMARATE 300 MG PO TABS: ORAL_TABLET | ORAL | 1 refills | Status: DC

## 2019-05-20 NOTE — Progress Notes (Signed)
[facility] Check  Patient ID: Tammy Mullins,  MRN: [id number]  PCP: Maury Dus, MD  Date of Evaluation: 05/20/2019 Time spent:20 minutes  Chief Complaint:  Chief Complaint    Anxiety; Depression; Medication Refill      HISTORY/CURRENT STATUS: HPI For routine med check.   Her sister died in 2022-04-04.  It was unexpected. "She was my best friend."  States she is doing well under the circumstances though.  Feels like the medications are working well.  She is able to enjoy things when she has the chance.  Energy and motivation are good most of the time.  She sleeps well.  She does not isolate.  No suicidal or homicidal thoughts.  Patient denies increased energy with decreased need for sleep, no increased talkativeness, no racing thoughts, no impulsivity or risky behaviors, no increased spending, no increased libido, no grandiosity. No irritability, no hallucinations.  States she feels anxious sometimes but it is not common.  She feels that the medicines help prevent that.  Denies dizziness, syncope, seizures, numbness, tingling, tremor, tics, unsteady gait, slurred speech, confusion. Denies muscle or joint pain, stiffness, or dystonia.  Individual Medical History/ Review of Systems: Changes? :No    Past medications for mental health diagnoses include: Cymbalta, Seroquel, Gabapentin  Allergies: Crestor [rosuvastatin calcium], Asa [aspirin], Topiramate, and Celebrex [celecoxib]  Current Medications:  Current Outpatient Medications:  .  allopurinol (ZYLOPRIM) 300 MG tablet, TAKE 1 AND 1/2 TABLETS(450 MG) BY MOUTH DAILY, Disp: 45 tablet, Rfl: 2 .  amLODipine (NORVASC) 10 MG tablet, TAKE 1 TABLET DAILY, Disp: 90 tablet, Rfl: 1 .  aspirin 81 MG tablet, Take 81 mg by mouth daily.  , Disp: , Rfl:  .  calcium carbonate (OSCAL) 1500 (600 Ca) MG TABS tablet, Take 600 mg of elemental calcium by mouth daily with breakfast., Disp: , Rfl:  .  CALCIUM-MAGNESIUM-ZINC PO, Take by mouth  daily., Disp: , Rfl:  .  Cholecalciferol (VITAMIN D) 2000 UNITS CAPS, Take 2,000 Units by mouth daily. , Disp: , Rfl:  .  diclofenac sodium (VOLTAREN) 1 % GEL, APPLY 2-4 GRAMS TO AFFECTED  JOINTS UP TO FOUR TIMES DAILY (Patient taking differently: Apply 1 application topically 4 (four) times daily. APPLY 2-4 GRAMS TO AFFECTED  JOINTS UP TO FOUR TIMES DAILY), Disp: 400 g, Rfl: 2 .  dicyclomine (BENTYL) 20 MG tablet, Take 1 tablet (20 mg total) by mouth 3 (three) times daily as needed., Disp: 90 tablet, Rfl: 0 .  DULoxetine (CYMBALTA) 60 MG capsule, Take 2 capsules (120 mg total) by mouth daily., Disp: 180 capsule, Rfl: 1 .  ferrous sulfate 325 (65 FE) MG tablet, Take 1 tablet (325 mg total) by mouth 2 (two) times daily with a meal., Disp: , Rfl: 3 .  fluticasone (FLONASE) 50 MCG/ACT nasal spray, Place 2 sprays into both nostrils at bedtime. (Patient taking differently: Place 2 sprays into both nostrils at bedtime as needed for allergies. ), Disp: 48 g, Rfl: 1 .  gabapentin (NEURONTIN) 600 MG tablet, Take 1 tablet (600 mg total) by mouth at bedtime., Disp: 90 tablet, Rfl: 0 .  Garlic [id number] MG CAPS, Take 300 mg by mouth daily. , Disp: , Rfl:  .  losartan (COZAAR) 100 MG tablet, Take 1 tablet (100 mg total) by mouth daily., Disp: 30 tablet, Rfl: 5 .  metoprolol succinate (TOPROL-XL) 100 MG 24 hr tablet, TAKE 1 TABLET DAILY, Disp: 90 tablet, Rfl: 1 .  omeprazole (PRILOSEC) 20 MG capsule, Take 20 mg by mouth  daily., Disp: , Rfl:  .  QUEtiapine (SEROQUEL) 300 MG tablet, TAKE 1 TABLET(300 MG) BY MOUTH AT BEDTIME, Disp: 90 tablet, Rfl: 1 .  methocarbamol (ROBAXIN) 500 MG tablet, Take 1 tablet (500 mg total) by mouth 4 (four) times daily. (Patient not taking: Reported on 05/20/2019), Disp: 45 tablet, Rfl: 0 Medication Side Effects: none  Family Medical/ Social History: Changes? Yes her sister passed away in January  MENTAL HEALTH EXAM:  There were no vitals taken for this visit.There is no height or weight on  file to calculate BMI.  General Appearance: Casual, Neat, Well Groomed and Appears younger than stated age  Eye Contact:  Good  Speech:  Clear and Coherent and Normal Rate  Volume:  Normal  Mood:  Euthymic  Affect:  Appropriate  Thought Process:  Goal Directed and Descriptions of Associations: Intact  Orientation:  Full (Time, Place, and Person)  Thought Content: Logical   Suicidal Thoughts:  No  Homicidal Thoughts:  No  Memory:  WNL  Judgement:  Good  Insight:  Good  Psychomotor Activity:  Normal  Concentration:  Concentration: Good  Recall:  Good  Fund of Knowledge: Good  Language: Good  Assets:  Desire for Improvement  ADL's:  Intact  Cognition: WNL  Prognosis:  Good    DIAGNOSES:    ICD-10-CM   1. Bipolar 2 disorder ([place])  F31.81     Receiving Psychotherapy: No    RECOMMENDATIONS:  PDMP was reviewed. I spent 20 minutes with her. Continue Cymbalta 60 mg, 2 p.o. daily. Continue Seroquel 300 mg, 1 p.o. nightly. Continue gabapentin 600 mg, 1 p.o. nightly by another provider. Return in 3 months.  Donnal Moat, PA-C

## 2019-05-21 ENCOUNTER — Ambulatory Visit: Payer: PPO | Admitting: Neurology

## 2019-05-22 ENCOUNTER — Ambulatory Visit: Payer: PPO | Admitting: Neurology

## 2019-05-29 MED ORDER — BUTALBITAL-APAP-CAFFEINE 50-325-40 MG PO TABS: 1.0000 | ORAL_TABLET | Freq: Four times a day (QID) | ORAL | 3 refills | Status: DC | PRN

## 2019-05-29 NOTE — Telephone Encounter (Signed)
Please advise her keep her next BOTOX Injection appt.  I called in Fioricet 10 tablet each month, use less than twice week to avoid medicine rebound headaches, she should avoid frequent over counter medications use.

## 2019-05-29 NOTE — Telephone Encounter (Addendum)
Spoke with patient who stated botox injections have helped, and she takes gabapentin, 2 tylenol ES as needed. This eases it off, and she has no sharp pains like she was having.  She stated headaches are almost daily, some days worse than others. She wants to know what else she can do to ease headaches. I advised will let Dr Krista Blue know and call her back.  Patient verbalized understanding, appreciation.

## 2019-05-29 NOTE — Addendum Note (Signed)
Addended by: Marcial Pacas on: 05/29/2019 05:24 PM   Modules accepted: Orders

## 2019-05-29 NOTE — Telephone Encounter (Signed)
Pt has called asking to speak with RN re: the headaches she still has, she says the injections help but they have not completley taken the headaches away, please call to discuss

## 2019-05-30 NOTE — Telephone Encounter (Signed)
Spoke with patient and advised her of new Rx per Dr Krista Blue. Advised she may only use less than two times a week and avoid over use of OTC headaches medicines. Explained she needs to avoid rebound headache. Patient verbalized understanding, appreciation.

## 2019-06-24 ENCOUNTER — Other Ambulatory Visit: Payer: Self-pay | Admitting: Family Medicine

## 2019-06-24 DIAGNOSIS — D509 Iron deficiency anemia, unspecified: Secondary | ICD-10-CM | POA: Diagnosis not present

## 2019-06-24 DIAGNOSIS — E1169 Type 2 diabetes mellitus with other specified complication: Secondary | ICD-10-CM | POA: Diagnosis not present

## 2019-06-24 DIAGNOSIS — K589 Irritable bowel syndrome without diarrhea: Secondary | ICD-10-CM | POA: Diagnosis not present

## 2019-06-24 DIAGNOSIS — K219 Gastro-esophageal reflux disease without esophagitis: Secondary | ICD-10-CM | POA: Diagnosis not present

## 2019-06-24 DIAGNOSIS — M8580 Other specified disorders of bone density and structure, unspecified site: Secondary | ICD-10-CM | POA: Diagnosis not present

## 2019-06-24 DIAGNOSIS — E2839 Other primary ovarian failure: Secondary | ICD-10-CM

## 2019-06-24 DIAGNOSIS — E7800 Pure hypercholesterolemia, unspecified: Secondary | ICD-10-CM | POA: Diagnosis not present

## 2019-06-24 DIAGNOSIS — M109 Gout, unspecified: Secondary | ICD-10-CM | POA: Diagnosis not present

## 2019-06-24 DIAGNOSIS — G44209 Tension-type headache, unspecified, not intractable: Secondary | ICD-10-CM | POA: Diagnosis not present

## 2019-06-24 DIAGNOSIS — I10 Essential (primary) hypertension: Secondary | ICD-10-CM | POA: Diagnosis not present

## 2019-06-24 DIAGNOSIS — Z7984 Long term (current) use of oral hypoglycemic drugs: Secondary | ICD-10-CM | POA: Diagnosis not present

## 2019-07-01 ENCOUNTER — Telehealth: Payer: Self-pay

## 2019-07-01 MED ORDER — GABAPENTIN 600 MG PO TABS: 600.0000 mg | ORAL_TABLET | Freq: Every day | ORAL | 0 refills | Status: DC

## 2019-07-01 NOTE — Telephone Encounter (Signed)
Refill request received via fax from [facility] on [address] for gabapentin.   Last Visit: 03/23/2018 telemedicine  Next Visit: 07/23/2019  Okay to refill per Dr. Estanislado Pandy.

## 2019-07-09 ENCOUNTER — Telehealth: Payer: Self-pay | Admitting: Neurology

## 2019-07-09 NOTE — Telephone Encounter (Signed)
Patient is scheduled for Botox appointment on 6/2. I called [facility] and spoke with [place]. She states that codes [phone] and [phone] are valid and billable and do not require P/A. Ref [id number].

## 2019-07-11 ENCOUNTER — Other Ambulatory Visit: Payer: Self-pay | Admitting: Neurological Surgery

## 2019-07-11 DIAGNOSIS — M4316 Spondylolisthesis, lumbar region: Secondary | ICD-10-CM | POA: Diagnosis not present
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
## 2019-07-18 DIAGNOSIS — E7800 Pure hypercholesterolemia, unspecified: Secondary | ICD-10-CM | POA: Diagnosis not present

## 2019-07-18 DIAGNOSIS — I10 Essential (primary) hypertension: Secondary | ICD-10-CM | POA: Diagnosis not present

## 2019-07-18 DIAGNOSIS — D500 Iron deficiency anemia secondary to blood loss (chronic): Secondary | ICD-10-CM | POA: Diagnosis not present

## 2019-07-18 DIAGNOSIS — K219 Gastro-esophageal reflux disease without esophagitis: Secondary | ICD-10-CM | POA: Diagnosis not present

## 2019-07-18 DIAGNOSIS — F324 Major depressive disorder, single episode, in partial remission: Secondary | ICD-10-CM | POA: Diagnosis not present

## 2019-07-18 DIAGNOSIS — E1169 Type 2 diabetes mellitus with other specified complication: Secondary | ICD-10-CM | POA: Diagnosis not present

## 2019-07-18 DIAGNOSIS — M8580 Other specified disorders of bone density and structure, unspecified site: Secondary | ICD-10-CM | POA: Diagnosis not present

## 2019-07-18 DIAGNOSIS — R7303 Prediabetes: Secondary | ICD-10-CM | POA: Diagnosis not present

## 2019-07-18 DIAGNOSIS — K589 Irritable bowel syndrome without diarrhea: Secondary | ICD-10-CM | POA: Diagnosis not present

## 2019-07-18 DIAGNOSIS — D509 Iron deficiency anemia, unspecified: Secondary | ICD-10-CM | POA: Diagnosis not present

## 2019-07-22 ENCOUNTER — Ambulatory Visit
Admission: RE | Admit: 2019-07-22 | Discharge: 2019-07-22 | Disposition: A | Discharge status: Home / Self Care | Payer: PPO | Source: Ambulatory Visit | Attending: Neurological Surgery | Admitting: Neurological Surgery

## 2019-07-22 ENCOUNTER — Other Ambulatory Visit: Payer: Self-pay

## 2019-07-22 DIAGNOSIS — M4316 Spondylolisthesis, lumbar region: Secondary | ICD-10-CM

## 2019-07-22 DIAGNOSIS — M545 Low back pain: Secondary | ICD-10-CM | POA: Diagnosis not present

## 2019-07-23 ENCOUNTER — Other Ambulatory Visit: Payer: Self-pay

## 2019-07-23 ENCOUNTER — Encounter: Payer: Self-pay | Admitting: Rheumatology

## 2019-07-23 ENCOUNTER — Ambulatory Visit: Payer: PPO | Admitting: Rheumatology

## 2019-07-23 VITALS — BP 121/83 | HR 92 | Resp 17 | Ht 65.5 in | Wt 178.8 lb

## 2019-07-23 DIAGNOSIS — M5136 Other intervertebral disc degeneration, lumbar region: Secondary | ICD-10-CM | POA: Diagnosis not present

## 2019-07-23 DIAGNOSIS — F32A Depression, unspecified: Secondary | ICD-10-CM

## 2019-07-23 DIAGNOSIS — Z8719 Personal history of other diseases of the digestive system: Secondary | ICD-10-CM

## 2019-07-23 DIAGNOSIS — M5030 Other cervical disc degeneration, unspecified cervical region: Secondary | ICD-10-CM | POA: Diagnosis not present

## 2019-07-23 DIAGNOSIS — M19041 Primary osteoarthritis, right hand: Secondary | ICD-10-CM | POA: Diagnosis not present

## 2019-07-23 DIAGNOSIS — M8589 Other specified disorders of bone density and structure, multiple sites: Secondary | ICD-10-CM

## 2019-07-23 DIAGNOSIS — Z8679 Personal history of other diseases of the circulatory system: Secondary | ICD-10-CM

## 2019-07-23 DIAGNOSIS — F419 Anxiety disorder, unspecified: Secondary | ICD-10-CM | POA: Diagnosis not present

## 2019-07-23 DIAGNOSIS — Z5181 Encounter for therapeutic drug level monitoring: Secondary | ICD-10-CM

## 2019-07-23 DIAGNOSIS — F329 Major depressive disorder, single episode, unspecified: Secondary | ICD-10-CM

## 2019-07-23 DIAGNOSIS — M797 Fibromyalgia: Secondary | ICD-10-CM | POA: Diagnosis not present

## 2019-07-23 DIAGNOSIS — M1A09X0 Idiopathic chronic gout, multiple sites, without tophus (tophi): Secondary | ICD-10-CM

## 2019-07-23 DIAGNOSIS — F5101 Primary insomnia: Secondary | ICD-10-CM | POA: Diagnosis not present

## 2019-07-23 DIAGNOSIS — M170 Bilateral primary osteoarthritis of knee: Secondary | ICD-10-CM

## 2019-07-23 DIAGNOSIS — M19042 Primary osteoarthritis, left hand: Secondary | ICD-10-CM

## 2019-07-23 DIAGNOSIS — R5383 Other fatigue: Secondary | ICD-10-CM | POA: Diagnosis not present

## 2019-07-23 DIAGNOSIS — G894 Chronic pain syndrome: Secondary | ICD-10-CM

## 2019-07-24 DIAGNOSIS — M4316 Spondylolisthesis, lumbar region: Secondary | ICD-10-CM | POA: Diagnosis not present

## 2019-07-24 DIAGNOSIS — R519 Headache, unspecified: Secondary | ICD-10-CM | POA: Diagnosis not present

## 2019-07-24 LAB — CBC WITH DIFFERENTIAL/PLATELET
Absolute Monocytes: 482 cells/uL (ref 200–950)
Basophils Absolute: 43 cells/uL (ref 0–200)
Basophils Relative: 0.6 %
Eosinophils Absolute: 281 cells/uL (ref 15–500)
Eosinophils Relative: 3.9 %
HCT: 37 % (ref 35.0–45.0)
Hemoglobin: 12.1 g/dL (ref 11.7–15.5)
Lymphs Abs: 1879 cells/uL (ref 850–3900)
MCH: 31.3 pg (ref 27.0–33.0)
MCHC: 32.7 g/dL (ref 32.0–36.0)
MCV: 95.6 fL (ref 80.0–100.0)
MPV: 9.6 fL (ref 7.5–12.5)
Monocytes Relative: 6.7 %
Neutro Abs: 4514 cells/uL (ref 1500–7800)
Neutrophils Relative %: 62.7 %
Platelets: 296 10*3/uL (ref 140–400)
RBC: 3.87 10*6/uL (ref 3.80–5.10)
RDW: 13.1 % (ref 11.0–15.0)
Total Lymphocyte: 26.1 %
WBC: 7.2 10*3/uL (ref 3.8–10.8)

## 2019-07-24 LAB — COMPLETE METABOLIC PANEL WITH GFR
AG Ratio: 1.7 (calc) (ref 1.0–2.5)
ALT: 12 U/L (ref 6–29)
AST: 12 U/L (ref 10–35)
Albumin: 4.3 g/dL (ref 3.6–5.1)
Alkaline phosphatase (APISO): 88 U/L (ref 37–153)
BUN: 18 mg/dL (ref 7–25)
CO2: 34 mmol/L — ABNORMAL HIGH (ref 20–32)
Calcium: 10.3 mg/dL (ref 8.6–10.4)
Chloride: 98 mmol/L (ref 98–110)
Creat: 0.81 mg/dL (ref 0.60–0.93)
GFR, Est African American: 85 mL/min/{1.73_m2} (ref >=60)
GFR, Est Non African American: 74 mL/min/{1.73_m2} (ref >=60)
Globulin: 2.5 g/dL (calc) (ref 1.9–3.7)
Glucose, Bld: 113 mg/dL — ABNORMAL HIGH (ref 65–99)
Potassium: 4 mmol/L (ref 3.5–5.3)
Sodium: 139 mmol/L (ref 135–146)
Total Bilirubin: 0.4 mg/dL (ref 0.2–1.2)
Total Protein: 6.8 g/dL (ref 6.1–8.1)

## 2019-07-24 LAB — URIC ACID: Uric Acid, Serum: 3.9 mg/dL (ref 2.5–7.0)

## 2019-07-24 NOTE — Progress Notes (Signed)
CBC and CMP are within normal limits.  Uric acid is in desirable range.  No change in therapy advised

## 2019-07-26 DIAGNOSIS — M545 Low back pain: Secondary | ICD-10-CM | POA: Diagnosis not present

## 2019-07-31 ENCOUNTER — Encounter: Payer: Self-pay | Admitting: Neurology

## 2019-07-31 ENCOUNTER — Ambulatory Visit (INDEPENDENT_AMBULATORY_CARE_PROVIDER_SITE_OTHER): Payer: PPO | Admitting: Neurology

## 2019-07-31 ENCOUNTER — Other Ambulatory Visit: Payer: Self-pay

## 2019-07-31 VITALS — BP 152/85 | HR 109 | Ht 65.5 in | Wt 182.0 lb

## 2019-07-31 DIAGNOSIS — Z79899 Other long term (current) drug therapy: Secondary | ICD-10-CM | POA: Diagnosis not present

## 2019-07-31 DIAGNOSIS — R519 Headache, unspecified: Secondary | ICD-10-CM

## 2019-07-31 DIAGNOSIS — Z87828 Personal history of other (healed) physical injury and trauma: Secondary | ICD-10-CM

## 2019-07-31 MED ORDER — GABAPENTIN 300 MG PO CAPS: 600.0000 mg | ORAL_CAPSULE | Freq: Three times a day (TID) | ORAL | 11 refills | Status: DC
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
## 2019-07-31 NOTE — Progress Notes (Signed)
Pt does not wish to proceed with further Botox injections.

## 2019-08-01 DIAGNOSIS — M4316 Spondylolisthesis, lumbar region: Secondary | ICD-10-CM | POA: Diagnosis not present

## 2019-08-01 DIAGNOSIS — R519 Headache, unspecified: Secondary | ICD-10-CM | POA: Diagnosis not present

## 2019-08-06 DIAGNOSIS — M4316 Spondylolisthesis, lumbar region: Secondary | ICD-10-CM | POA: Diagnosis not present

## 2019-08-06 DIAGNOSIS — R519 Headache, unspecified: Secondary | ICD-10-CM | POA: Diagnosis not present

## 2019-08-12 ENCOUNTER — Other Ambulatory Visit: Payer: Self-pay | Admitting: Rheumatology

## 2019-08-12 NOTE — Telephone Encounter (Signed)
Last Visit: 07/23/2019 °Next Visit: 01/15/2020 ° °Okay to refill per Dr. Deveshwar  °

## 2019-08-15 DIAGNOSIS — R519 Headache, unspecified: Secondary | ICD-10-CM | POA: Diagnosis not present

## 2019-08-15 DIAGNOSIS — M4316 Spondylolisthesis, lumbar region: Secondary | ICD-10-CM | POA: Diagnosis not present

## 2019-08-19 DIAGNOSIS — Z83511 Family history of glaucoma: Secondary | ICD-10-CM | POA: Diagnosis not present

## 2019-08-19 DIAGNOSIS — H2513 Age-related nuclear cataract, bilateral: Secondary | ICD-10-CM | POA: Diagnosis not present

## 2019-08-19 DIAGNOSIS — H04123 Dry eye syndrome of bilateral lacrimal glands: Secondary | ICD-10-CM | POA: Diagnosis not present

## 2019-08-19 DIAGNOSIS — H43813 Vitreous degeneration, bilateral: Secondary | ICD-10-CM | POA: Diagnosis not present

## 2019-08-20 ENCOUNTER — Other Ambulatory Visit: Payer: Self-pay | Admitting: Rheumatology

## 2019-08-20 NOTE — Telephone Encounter (Signed)
Last Visit: 07/23/2019 Next Visit: 01/15/2020 Labs: 07/23/2019 CBC and CMP are within normal limits. Uric acid is in desirable range. No change in therapy advised  Current Dose per office note on 07/23/2019: Allopurinol 300 mg, 1-1/2 tablets a day.  Okay to refill per Dr. Deveshwar  

## 2019-08-22 ENCOUNTER — Ambulatory Visit: Payer: PPO | Admitting: Physician Assistant

## 2019-08-22 DIAGNOSIS — R519 Headache, unspecified: Secondary | ICD-10-CM | POA: Diagnosis not present

## 2019-08-22 DIAGNOSIS — M4316 Spondylolisthesis, lumbar region: Secondary | ICD-10-CM | POA: Diagnosis not present

## 2019-08-26 ENCOUNTER — Telehealth: Payer: Self-pay | Admitting: Neurology

## 2019-08-26 NOTE — Telephone Encounter (Signed)
Patient called and states she can not remember her MRI results and she is still having headaches. She requests CB from RN.

## 2019-08-26 NOTE — Telephone Encounter (Signed)
I returned the call to patient. She has not been taking gabapentin, as prescribed. She going to start taking it regularly to see if her headaches will become more manageable. She understands to use Fioricet to treat her more significant pain and to try not to medicate the mild headaches. I also reviewed the MRI information below with the her again and she verbalized understanding.  Note from Dr. 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   Please call patient, MRI of the brain showed mild age-related changes, mild supratentorium small vessel disease, there was no acute abnormalities.

## 2019-09-04 ENCOUNTER — Other Ambulatory Visit: Payer: Self-pay | Admitting: Rheumatology

## 2019-09-04 NOTE — Telephone Encounter (Signed)
Last Visit: 07/23/2019 °Next Visit: 01/15/2020 ° °Okay to refill per Dr. Deveshwar  °

## 2019-09-17 ENCOUNTER — Other Ambulatory Visit: Payer: Self-pay

## 2019-09-17 ENCOUNTER — Encounter: Payer: Self-pay | Admitting: Physician Assistant

## 2019-09-17 ENCOUNTER — Ambulatory Visit (INDEPENDENT_AMBULATORY_CARE_PROVIDER_SITE_OTHER): Payer: PPO | Admitting: Physician Assistant

## 2019-09-17 DIAGNOSIS — R519 Headache, unspecified: Secondary | ICD-10-CM | POA: Diagnosis not present

## 2019-09-17 DIAGNOSIS — G8929 Other chronic pain: Secondary | ICD-10-CM | POA: Diagnosis not present

## 2019-09-17 DIAGNOSIS — F3181 Bipolar II disorder: Secondary | ICD-10-CM | POA: Diagnosis not present

## 2019-09-17 NOTE — Progress Notes (Signed)
[facility] Med Check  Patient ID: Tammy Mullins,  MRN: [id number]  PCP: Tammy Dus, MD  Date of Evaluation: 09/17/2019 Time spent:20 minutes  Chief Complaint:  Chief Complaint    Anxiety; Depression      HISTORY/CURRENT STATUS: HPI For routine med check.   Feels like the medications are working well.  She is able to enjoy things when she has the chance.  Energy and motivation are good most of the time.  She still really misses her sister who passed away about 6 months ago, but the grief is not as difficult as time goes on.  No suicidal or homicidal thoughts.  Patient denies increased energy with decreased need for sleep, no increased talkativeness, no racing thoughts, no impulsivity or risky behaviors, no increased spending, no increased libido, no grandiosity. No irritability, no hallucinations.  States she feels anxious sometimes but it is not common.  She feels that the medicines help prevent that.  Sleeps well most of the time.  She does suffer from migraine headaches.  This is a chronic ongoing problem.  She sees neurology for that and an increase in gabapentin has been helpful.  Tammy Mullins has also been dealing with irritable bowel syndrome lately.  She has seen PCP for this and it is much better now.  Denies dizziness, syncope, seizures, numbness, tingling, tremor, tics, unsteady gait, slurred speech, confusion. Denies muscle or joint pain, stiffness, or dystonia.  Individual Medical History/ Review of Systems: Changes? :No    Past medications for mental health diagnoses include: Cymbalta, Seroquel, Gabapentin  Allergies: Crestor [rosuvastatin calcium], Asa [aspirin], Topiramate, and Celebrex [celecoxib]  Current Medications:  Current Outpatient Medications:  .  allopurinol (ZYLOPRIM) 300 MG tablet, TAKE 1 AND 1/2 TABLETS(450 MG) BY MOUTH DAILY, Disp: 45 tablet, Rfl: 2 .  amLODipine (NORVASC) 10 MG tablet, TAKE 1 TABLET DAILY, Disp: 90 tablet, Rfl: 1 .  aspirin 81  MG tablet, Take 81 mg by mouth daily.  , Disp: , Rfl:  .  butalbital-acetaminophen-caffeine (FIORICET) 50-325-40 MG tablet, Take 1 tablet by mouth every 6 (six) hours as needed for headache., Disp: 10 tablet, Rfl: 3 .  calcium carbonate (OSCAL) 1500 (600 Ca) MG TABS tablet, Take 600 mg of elemental calcium by mouth daily with breakfast., Disp: , Rfl:  .  CALCIUM-MAGNESIUM-ZINC PO, Take by mouth daily., Disp: , Rfl:  .  Cholecalciferol (VITAMIN D) 2000 UNITS CAPS, Take 2,000 Units by mouth daily. , Disp: , Rfl:  .  diclofenac Sodium (VOLTAREN) 1 % GEL, APPLY 2-4 GRAMS TO AFFECTED JOINTS UP TO FOUR TIMES DAILY, Disp: 400 g, Rfl: 0 .  dicyclomine (BENTYL) 20 MG tablet, Take 1 tablet (20 mg total) by mouth 3 (three) times daily as needed., Disp: 90 tablet, Rfl: 0 .  DULoxetine (CYMBALTA) 60 MG capsule, Take 2 capsules (120 mg total) by mouth daily., Disp: 180 capsule, Rfl: 1 .  famotidine (PEPCID) 40 MG tablet, Take 40 mg by mouth daily., Disp: , Rfl:  .  ferrous sulfate 325 (65 FE) MG tablet, Take 1 tablet (325 mg total) by mouth 2 (two) times daily with a meal., Disp: , Rfl: 3 .  fluticasone (FLONASE) 50 MCG/ACT nasal spray, Place 2 sprays into both nostrils at bedtime. (Patient taking differently: Place 2 sprays into both nostrils at bedtime as needed for allergies. ), Disp: 48 g, Rfl: 1 .  gabapentin (NEURONTIN) 300 MG capsule, Take 2 capsules (600 mg total) by mouth 3 (three) times daily., Disp: 180 capsule, Rfl: 11 .  Garlic 130 MG CAPS, Take 300 mg by mouth daily. , Disp: , Rfl:  .  hydrochlorothiazide (HYDRODIURIL) 25 MG tablet, Take 25 mg by mouth daily., Disp: , Rfl:  .  hyoscyamine (LEVSIN) 0.125 MG tablet, Take 0.125 mg by mouth every 4 (four) hours as needed., Disp: , Rfl:  .  losartan (COZAAR) 100 MG tablet, Take 1 tablet (100 mg total) by mouth daily., Disp: 30 tablet, Rfl: 5 .  methocarbamol (ROBAXIN) 500 MG tablet, Take 1 tablet (500 mg total) by mouth 4 (four) times daily. (Patient  taking differently: Take 500 mg by mouth at bedtime as needed. ), Disp: 45 tablet, Rfl: 0 .  metoprolol succinate (TOPROL-XL) 100 MG 24 hr tablet, TAKE 1 TABLET DAILY, Disp: 90 tablet, Rfl: 1 .  omeprazole (PRILOSEC) 20 MG capsule, Take 20 mg by mouth daily., Disp: , Rfl:  .  QUEtiapine (SEROQUEL) 300 MG tablet, TAKE 1 TABLET(300 MG) BY MOUTH AT BEDTIME, Disp: 90 tablet, Rfl: 1 .  rosuvastatin (CRESTOR) 5 MG tablet, Take 5 mg by mouth once a week., Disp: , Rfl:  Medication Side Effects: none  Family Medical/ Social History: Changes?  No  MENTAL HEALTH EXAM:  There were no vitals taken for this visit.There is no height or weight on file to calculate BMI.  General Appearance: Casual, Neat, Well Groomed and Appears younger than stated age  Eye Contact:  Good  Speech:  Clear and Coherent and Normal Rate  Volume:  Normal  Mood:  Euthymic  Affect:  Appropriate  Thought Process:  Goal Directed and Descriptions of Associations: Intact  Orientation:  Full (Time, Place, and Person)  Thought Content: Logical   Suicidal Thoughts:  No  Homicidal Thoughts:  No  Memory:  WNL  Judgement:  Good  Insight:  Good  Psychomotor Activity:  Normal  Concentration:  Concentration: Good  Recall:  Good  Fund of Knowledge: Good  Language: Good  Assets:  Desire for Improvement  ADL's:  Intact  Cognition: WNL  Prognosis:  Good     DIAGNOSES:    ICD-10-CM   1. Bipolar 2 disorder (HCC)  F31.81   2. Chronic nonintractable headache, unspecified headache type  R51.9    G89.29     Receiving Psychotherapy: No    RECOMMENDATIONS:  PDMP was reviewed. I provided 20 minutes of face to face time during this encounter.  Continue Cymbalta 60 mg, 2 p.o. daily. Continue Seroquel 300 mg, 1 p.o. nightly. Continue Gabapentin 300 mg, 2 po tid. for another provider. Return in 3 months.  Donnal Moat, PA-C

## 2019-09-18 DIAGNOSIS — M4316 Spondylolisthesis, lumbar region: Secondary | ICD-10-CM | POA: Diagnosis not present

## 2019-09-18 DIAGNOSIS — R519 Headache, unspecified: Secondary | ICD-10-CM | POA: Diagnosis not present

## 2019-09-23 ENCOUNTER — Telehealth: Payer: Self-pay | Admitting: Neurology

## 2019-09-23 DIAGNOSIS — E1169 Type 2 diabetes mellitus with other specified complication: Secondary | ICD-10-CM | POA: Diagnosis not present

## 2019-09-23 DIAGNOSIS — M8580 Other specified disorders of bone density and structure, unspecified site: Secondary | ICD-10-CM | POA: Diagnosis not present

## 2019-09-23 DIAGNOSIS — E7800 Pure hypercholesterolemia, unspecified: Secondary | ICD-10-CM | POA: Diagnosis not present

## 2019-09-23 DIAGNOSIS — F324 Major depressive disorder, single episode, in partial remission: Secondary | ICD-10-CM | POA: Diagnosis not present

## 2019-09-23 DIAGNOSIS — I10 Essential (primary) hypertension: Secondary | ICD-10-CM | POA: Diagnosis not present

## 2019-09-23 DIAGNOSIS — D509 Iron deficiency anemia, unspecified: Secondary | ICD-10-CM | POA: Diagnosis not present

## 2019-09-23 DIAGNOSIS — D500 Iron deficiency anemia secondary to blood loss (chronic): Secondary | ICD-10-CM | POA: Diagnosis not present

## 2019-09-23 NOTE — Telephone Encounter (Signed)
She has intermittent problems with sharp pains in her forehead. This is not new for her. She said these symptoms seldom occur. She uses a ice pack which helps the pain. She will also try the Fioricet she has at home. She has continued taking gabapentin 300mg , two capsules TID. Reports also having dry needling to the area that has been beneficial.

## 2019-09-23 NOTE — Telephone Encounter (Addendum)
Pt called stating she is needing to discuss her sharp pains she is getting along with her headaches. Please advise.

## 2019-09-27 ENCOUNTER — Other Ambulatory Visit: Payer: Self-pay | Admitting: Rheumatology

## 2019-09-27 NOTE — Telephone Encounter (Signed)
Last Visit: 07/23/2019 °Next Visit: 01/15/2020 ° °Okay to refill per Dr. Deveshwar  °

## 2019-10-01 DIAGNOSIS — R197 Diarrhea, unspecified: Secondary | ICD-10-CM | POA: Diagnosis not present

## 2019-10-01 DIAGNOSIS — K589 Irritable bowel syndrome without diarrhea: Secondary | ICD-10-CM | POA: Diagnosis not present

## 2019-10-01 DIAGNOSIS — K219 Gastro-esophageal reflux disease without esophagitis: Secondary | ICD-10-CM | POA: Diagnosis not present

## 2019-10-01 DIAGNOSIS — Z79899 Other long term (current) drug therapy: Secondary | ICD-10-CM | POA: Diagnosis not present

## 2019-10-21 ENCOUNTER — Other Ambulatory Visit: Payer: Self-pay | Admitting: Rheumatology

## 2019-10-21 NOTE — Telephone Encounter (Signed)
Last Visit: 07/23/2019 °Next Visit: 01/15/2020 ° °Okay to refill per Dr. Deveshwar  °

## 2019-10-23 DIAGNOSIS — M4316 Spondylolisthesis, lumbar region: Secondary | ICD-10-CM | POA: Diagnosis not present

## 2019-10-23 DIAGNOSIS — R519 Headache, unspecified: Secondary | ICD-10-CM | POA: Diagnosis not present

## 2019-10-24 DIAGNOSIS — M545 Low back pain: Secondary | ICD-10-CM | POA: Diagnosis not present

## 2019-10-29 DIAGNOSIS — Z7984 Long term (current) use of oral hypoglycemic drugs: Secondary | ICD-10-CM | POA: Diagnosis not present

## 2019-10-29 DIAGNOSIS — D509 Iron deficiency anemia, unspecified: Secondary | ICD-10-CM | POA: Diagnosis not present

## 2019-10-29 DIAGNOSIS — D500 Iron deficiency anemia secondary to blood loss (chronic): Secondary | ICD-10-CM | POA: Diagnosis not present

## 2019-10-29 DIAGNOSIS — E7800 Pure hypercholesterolemia, unspecified: Secondary | ICD-10-CM | POA: Diagnosis not present

## 2019-10-29 DIAGNOSIS — E1169 Type 2 diabetes mellitus with other specified complication: Secondary | ICD-10-CM | POA: Diagnosis not present

## 2019-10-29 DIAGNOSIS — M8580 Other specified disorders of bone density and structure, unspecified site: Secondary | ICD-10-CM | POA: Diagnosis not present

## 2019-10-29 DIAGNOSIS — F324 Major depressive disorder, single episode, in partial remission: Secondary | ICD-10-CM | POA: Diagnosis not present

## 2019-10-29 DIAGNOSIS — I10 Essential (primary) hypertension: Secondary | ICD-10-CM | POA: Diagnosis not present

## 2019-10-29 DIAGNOSIS — K219 Gastro-esophageal reflux disease without esophagitis: Secondary | ICD-10-CM | POA: Diagnosis not present

## 2019-11-06 ENCOUNTER — Ambulatory Visit: Payer: PPO | Admitting: Neurology

## 2019-11-15 ENCOUNTER — Other Ambulatory Visit: Payer: Self-pay | Admitting: Rheumatology

## 2019-11-16 ENCOUNTER — Other Ambulatory Visit: Payer: Self-pay | Admitting: Rheumatology

## 2019-11-18 NOTE — Telephone Encounter (Signed)
Last Visit: 07/23/2019 °Next Visit: 01/15/2020 ° °Okay to refill per Dr. Deveshwar  °

## 2019-11-25 ENCOUNTER — Other Ambulatory Visit: Payer: Self-pay | Admitting: Rheumatology

## 2019-11-25 NOTE — Telephone Encounter (Signed)
Last Visit: 07/23/2019 Next Visit: 01/15/2020 Labs: 07/23/2019 CBC and CMP are within normal limits. Uric acid is in desirable range. No change in therapy advised  Current Dose per office note on 07/23/2019: Allopurinol 300 mg, 1-1/2 tablets a day.  Okay to refill per Dr. Estanislado Pandy

## 2019-12-03 ENCOUNTER — Other Ambulatory Visit: Payer: Self-pay | Admitting: Neurology

## 2019-12-12 ENCOUNTER — Other Ambulatory Visit: Payer: Self-pay

## 2019-12-12 ENCOUNTER — Ambulatory Visit
Admission: RE | Admit: 2019-12-12 | Discharge: 2019-12-12 | Disposition: A | Discharge status: Home / Self Care | Payer: PPO | Source: Ambulatory Visit | Attending: Family Medicine | Admitting: Family Medicine

## 2019-12-12 DIAGNOSIS — M85851 Other specified disorders of bone density and structure, right thigh: Secondary | ICD-10-CM | POA: Diagnosis not present

## 2019-12-12 DIAGNOSIS — E2839 Other primary ovarian failure: Secondary | ICD-10-CM

## 2019-12-12 DIAGNOSIS — Z780 Asymptomatic menopausal state: Secondary | ICD-10-CM | POA: Diagnosis not present

## 2019-12-15 ENCOUNTER — Other Ambulatory Visit: Payer: Self-pay | Admitting: Rheumatology

## 2019-12-16 NOTE — Telephone Encounter (Signed)
Last Visit: 07/23/2019 Next Visit: 01/15/2020  Okay to refill per Dr. Estanislado Pandy

## 2019-12-19 ENCOUNTER — Encounter: Payer: Self-pay | Admitting: Physician Assistant

## 2019-12-19 ENCOUNTER — Ambulatory Visit (INDEPENDENT_AMBULATORY_CARE_PROVIDER_SITE_OTHER): Payer: PPO | Admitting: Physician Assistant

## 2019-12-19 ENCOUNTER — Other Ambulatory Visit: Payer: Self-pay

## 2019-12-19 DIAGNOSIS — F3181 Bipolar II disorder: Secondary | ICD-10-CM | POA: Diagnosis not present

## 2019-12-19 DIAGNOSIS — F5105 Insomnia due to other mental disorder: Secondary | ICD-10-CM | POA: Diagnosis not present

## 2019-12-19 DIAGNOSIS — F99 Mental disorder, not otherwise specified: Secondary | ICD-10-CM

## 2019-12-19 MED ORDER — DULOXETINE HCL 60 MG PO CPEP: 120.0000 mg | ORAL_CAPSULE | Freq: Every day | ORAL | 1 refills | Status: DC

## 2019-12-19 MED ORDER — QUETIAPINE FUMARATE 400 MG PO TABS: 400.0000 mg | ORAL_TABLET | Freq: Every day | ORAL | 1 refills | Status: DC

## 2019-12-19 NOTE — Progress Notes (Signed)
[facility]  Patient ID: Tammy Mullins,  MRN: [id number]  PCP: Maury Dus, MD  Date of Evaluation: 12/19/2019 Time spent:30 minutes  Chief Complaint:  Chief Complaint    Anxiety; Depression      HISTORY/CURRENT STATUS: HPI For routine med check.   Feeling a little more down and not sleeping as well lately.  She is wondering if we need to increase the Seroquel.  She has been on a higher dose at times in the past and when she felt better the dose would be decreased.  She is not having any manic symptoms but just does not feel her self.  Because of the chronic pain, that has certainly affected her mood.  She is looking forward to going to her nephew's wedding next weekend in [facility].  He is the son of her sister who died earlier this year.  She and several other family members want to be there to support him.  Patient denies increased energy with decreased need for sleep, no increased talkativeness, no racing thoughts, no impulsivity or risky behaviors, no increased spending, no increased libido, no grandiosity. No irritability, no paranoia, no hallucinations.  She continues to have migraine headaches. She sees neurology for that and an increase in gabapentin has been helpful.  The IBS symptoms are better.  She has been eating better and that has helped a lot.  Denies dizziness, syncope, seizures, numbness, tingling, tremor, tics, unsteady gait, slurred speech, confusion. Denies muscle or joint pain, stiffness, or dystonia.  Individual Medical History/ Review of Systems: Changes? :No    Past medications for mental health diagnoses include: Cymbalta, Seroquel, Gabapentin  Allergies: Crestor [rosuvastatin calcium], Asa [aspirin], Topiramate, and Celebrex [celecoxib]  Current Medications:  Current Outpatient Medications:  .  allopurinol (ZYLOPRIM) 300 MG tablet, TAKE 1 AND 1/2 TABLETS(450 MG) BY MOUTH DAILY, Disp: 45 tablet, Rfl: 2 .  amLODipine (NORVASC) 10  MG tablet, TAKE 1 TABLET DAILY, Disp: 90 tablet, Rfl: 1 .  aspirin 81 MG tablet, Take 81 mg by mouth daily.  , Disp: , Rfl:  .  butalbital-acetaminophen-caffeine (FIORICET) 50-325-40 MG tablet, TAKE 1 TABLET BY MOUTH EVERY 6 HOURS AS NEEDED FOR HEADACHE, Disp: 10 tablet, Rfl: 3 .  calcium carbonate (OSCAL) 1500 (600 Ca) MG TABS tablet, Take 600 mg of elemental calcium by mouth daily with breakfast., Disp: , Rfl:  .  CALCIUM-MAGNESIUM-ZINC PO, Take by mouth daily., Disp: , Rfl:  .  Cholecalciferol (VITAMIN D) 2000 UNITS CAPS, Take 2,000 Units by mouth daily. , Disp: , Rfl:  .  diclofenac Sodium (VOLTAREN) 1 % GEL, APPLY 2-4 GRAMS TO AFFECTED JOINTS UP TO FOUR TIMES DAILY, Disp: 400 g, Rfl: 0 .  dicyclomine (BENTYL) 20 MG tablet, Take 1 tablet (20 mg total) by mouth 3 (three) times daily as needed., Disp: 90 tablet, Rfl: 0 .  DULoxetine (CYMBALTA) 60 MG capsule, Take 2 capsules (120 mg total) by mouth daily., Disp: 180 capsule, Rfl: 1 .  famotidine (PEPCID) 40 MG tablet, Take 40 mg by mouth daily., Disp: , Rfl:  .  ferrous sulfate 325 (65 FE) MG tablet, Take 1 tablet (325 mg total) by mouth 2 (two) times daily with a meal., Disp: , Rfl: 3 .  fluticasone (FLONASE) 50 MCG/ACT nasal spray, Place 2 sprays into both nostrils at bedtime. (Patient taking differently: Place 2 sprays into both nostrils at bedtime as needed for allergies. ), Disp: 48 g, Rfl: 1 .  gabapentin (NEURONTIN) 300 MG capsule, Take 2 capsules (  600 mg total) by mouth 3 (three) times daily., Disp: 180 capsule, Rfl: 11 .  Garlic 702 MG CAPS, Take 300 mg by mouth daily. , Disp: , Rfl:  .  hydrochlorothiazide (HYDRODIURIL) 25 MG tablet, Take 25 mg by mouth daily., Disp: , Rfl:  .  hyoscyamine (LEVSIN) 0.125 MG tablet, Take 0.125 mg by mouth every 4 (four) hours as needed., Disp: , Rfl:  .  losartan (COZAAR) 100 MG tablet, Take 1 tablet (100 mg total) by mouth daily., Disp: 30 tablet, Rfl: 5 .  methocarbamol (ROBAXIN) 500 MG tablet, Take 1  tablet (500 mg total) by mouth 4 (four) times daily. (Patient taking differently: Take 500 mg by mouth at bedtime as needed. ), Disp: 45 tablet, Rfl: 0 .  metoprolol succinate (TOPROL-XL) 100 MG 24 hr tablet, TAKE 1 TABLET DAILY, Disp: 90 tablet, Rfl: 1 .  omeprazole (PRILOSEC) 20 MG capsule, Take 20 mg by mouth daily., Disp: , Rfl:  .  rosuvastatin (CRESTOR) 5 MG tablet, Take 5 mg by mouth once a week., Disp: , Rfl:  .  QUEtiapine (SEROQUEL) 400 MG tablet, Take 1 tablet (400 mg total) by mouth at bedtime., Disp: 30 tablet, Rfl: 1 Medication Side Effects: none  Family Medical/ Social History: Changes?  No  MENTAL HEALTH EXAM:  There were no vitals taken for this visit.There is no height or weight on file to calculate BMI.  General Appearance: Casual, Neat, Well Groomed and Appears younger than stated age  Eye Contact:  Good  Speech:  Clear and Coherent and Normal Rate  Volume:  Normal  Mood:  Euthymic  Affect:  Appropriate  Thought Process:  Goal Directed and Descriptions of Associations: Intact  Orientation:  Full (Time, Place, and Person)  Thought Content: Logical   Suicidal Thoughts:  No  Homicidal Thoughts:  No  Memory:  WNL  Judgement:  Good  Insight:  Good  Psychomotor Activity:  Normal  Concentration:  Concentration: Good  Recall:  Good  Fund of Knowledge: Good  Language: Good  Assets:  Desire for Improvement  ADL's:  Intact  Cognition: WNL  Prognosis:  Good     DIAGNOSES:    ICD-10-CM   1. Bipolar 2 disorder (HCC)  F31.81   2. Insomnia due to other mental disorder  F51.05    F99     Receiving Psychotherapy: No    RECOMMENDATIONS:  PDMP was reviewed. I provided 30 minutes of face to face time during this encounter.  We discussed different options for treatment.  Since she has responded well in the past to increasing the Seroquel, I recommend we do that again but not go all the way to 600 mg which she had been on.  That made her very sleepy all day long. She  has an appointment with her PCP in the next few months to have her wellness check.  Labs will be drawn then.  We can discuss at her next visit. Sleep hygiene was discussed. Continue Cymbalta 60 mg, 2 p.o. daily. Increase Seroquel to 400 mg, 1 p.o. nightly. Continue Gabapentin 300 mg, 2 po tid. for another provider. Return in 4 to 6 weeks.  Donnal Moat, PA-C

## 2019-12-20 DIAGNOSIS — E7800 Pure hypercholesterolemia, unspecified: Secondary | ICD-10-CM | POA: Diagnosis not present

## 2019-12-20 DIAGNOSIS — D500 Iron deficiency anemia secondary to blood loss (chronic): Secondary | ICD-10-CM | POA: Diagnosis not present

## 2019-12-20 DIAGNOSIS — D509 Iron deficiency anemia, unspecified: Secondary | ICD-10-CM | POA: Diagnosis not present

## 2019-12-20 DIAGNOSIS — I10 Essential (primary) hypertension: Secondary | ICD-10-CM | POA: Diagnosis not present

## 2019-12-20 DIAGNOSIS — M8580 Other specified disorders of bone density and structure, unspecified site: Secondary | ICD-10-CM | POA: Diagnosis not present

## 2019-12-20 DIAGNOSIS — E1169 Type 2 diabetes mellitus with other specified complication: Secondary | ICD-10-CM | POA: Diagnosis not present

## 2019-12-20 DIAGNOSIS — K219 Gastro-esophageal reflux disease without esophagitis: Secondary | ICD-10-CM | POA: Diagnosis not present

## 2019-12-20 DIAGNOSIS — F324 Major depressive disorder, single episode, in partial remission: Secondary | ICD-10-CM | POA: Diagnosis not present
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
## 2020-01-09 DIAGNOSIS — G8929 Other chronic pain: Secondary | ICD-10-CM | POA: Diagnosis not present

## 2020-01-09 DIAGNOSIS — M545 Low back pain, unspecified: Secondary | ICD-10-CM | POA: Diagnosis not present

## 2020-01-11 ENCOUNTER — Other Ambulatory Visit: Payer: Self-pay | Admitting: Rheumatology

## 2020-01-15 ENCOUNTER — Encounter: Payer: Self-pay | Admitting: Rheumatology

## 2020-01-15 ENCOUNTER — Other Ambulatory Visit: Payer: Self-pay

## 2020-01-15 ENCOUNTER — Ambulatory Visit: Payer: PPO | Admitting: Rheumatology

## 2020-01-15 VITALS — BP 147/93 | HR 83 | Resp 16 | Ht 65.5 in | Wt 180.4 lb

## 2020-01-15 DIAGNOSIS — G894 Chronic pain syndrome: Secondary | ICD-10-CM | POA: Diagnosis not present

## 2020-01-15 DIAGNOSIS — R5383 Other fatigue: Secondary | ICD-10-CM | POA: Diagnosis not present

## 2020-01-15 DIAGNOSIS — M8589 Other specified disorders of bone density and structure, multiple sites: Secondary | ICD-10-CM | POA: Diagnosis not present

## 2020-01-15 DIAGNOSIS — F5101 Primary insomnia: Secondary | ICD-10-CM

## 2020-01-15 DIAGNOSIS — M5030 Other cervical disc degeneration, unspecified cervical region: Secondary | ICD-10-CM | POA: Diagnosis not present

## 2020-01-15 DIAGNOSIS — M170 Bilateral primary osteoarthritis of knee: Secondary | ICD-10-CM

## 2020-01-15 DIAGNOSIS — M19041 Primary osteoarthritis, right hand: Secondary | ICD-10-CM

## 2020-01-15 DIAGNOSIS — Z5181 Encounter for therapeutic drug level monitoring: Secondary | ICD-10-CM

## 2020-01-15 DIAGNOSIS — M797 Fibromyalgia: Secondary | ICD-10-CM | POA: Diagnosis not present

## 2020-01-15 DIAGNOSIS — Z8679 Personal history of other diseases of the circulatory system: Secondary | ICD-10-CM | POA: Diagnosis not present

## 2020-01-15 DIAGNOSIS — M5136 Other intervertebral disc degeneration, lumbar region: Secondary | ICD-10-CM | POA: Diagnosis not present

## 2020-01-15 DIAGNOSIS — Z8719 Personal history of other diseases of the digestive system: Secondary | ICD-10-CM

## 2020-01-15 DIAGNOSIS — F419 Anxiety disorder, unspecified: Secondary | ICD-10-CM

## 2020-01-15 DIAGNOSIS — M51369 Other intervertebral disc degeneration, lumbar region without mention of lumbar back pain or lower extremity pain: Secondary | ICD-10-CM

## 2020-01-15 DIAGNOSIS — M1A09X0 Idiopathic chronic gout, multiple sites, without tophus (tophi): Secondary | ICD-10-CM

## 2020-01-15 DIAGNOSIS — F32A Depression, unspecified: Secondary | ICD-10-CM

## 2020-01-15 DIAGNOSIS — M19042 Primary osteoarthritis, left hand: Secondary | ICD-10-CM

## 2020-01-21 ENCOUNTER — Other Ambulatory Visit: Payer: Self-pay | Admitting: Physician Assistant

## 2020-01-21 DIAGNOSIS — K219 Gastro-esophageal reflux disease without esophagitis: Secondary | ICD-10-CM | POA: Diagnosis not present

## 2020-01-21 DIAGNOSIS — D500 Iron deficiency anemia secondary to blood loss (chronic): Secondary | ICD-10-CM | POA: Diagnosis not present

## 2020-01-21 DIAGNOSIS — I10 Essential (primary) hypertension: Secondary | ICD-10-CM | POA: Diagnosis not present

## 2020-01-21 DIAGNOSIS — E1169 Type 2 diabetes mellitus with other specified complication: Secondary | ICD-10-CM | POA: Diagnosis not present

## 2020-01-21 DIAGNOSIS — D509 Iron deficiency anemia, unspecified: Secondary | ICD-10-CM | POA: Diagnosis not present

## 2020-01-21 DIAGNOSIS — F324 Major depressive disorder, single episode, in partial remission: Secondary | ICD-10-CM | POA: Diagnosis not present

## 2020-01-21 DIAGNOSIS — E7800 Pure hypercholesterolemia, unspecified: Secondary | ICD-10-CM | POA: Diagnosis not present

## 2020-01-21 DIAGNOSIS — M8580 Other specified disorders of bone density and structure, unspecified site: Secondary | ICD-10-CM | POA: Diagnosis not present

## 2020-01-27 ENCOUNTER — Other Ambulatory Visit: Payer: Self-pay

## 2020-01-27 ENCOUNTER — Ambulatory Visit (INDEPENDENT_AMBULATORY_CARE_PROVIDER_SITE_OTHER): Payer: PPO | Admitting: Physician Assistant

## 2020-01-27 ENCOUNTER — Encounter: Payer: Self-pay | Admitting: Physician Assistant

## 2020-01-27 DIAGNOSIS — R519 Headache, unspecified: Secondary | ICD-10-CM

## 2020-01-27 DIAGNOSIS — F3181 Bipolar II disorder: Secondary | ICD-10-CM

## 2020-01-27 DIAGNOSIS — F99 Mental disorder, not otherwise specified: Secondary | ICD-10-CM

## 2020-01-27 DIAGNOSIS — F5105 Insomnia due to other mental disorder: Secondary | ICD-10-CM

## 2020-01-27 DIAGNOSIS — G8929 Other chronic pain: Secondary | ICD-10-CM | POA: Diagnosis not present

## 2020-01-27 MED ORDER — QUETIAPINE FUMARATE 400 MG PO TABS: 400.0000 mg | ORAL_TABLET | Freq: Every day | ORAL | 1 refills | Status: DC

## 2020-01-27 NOTE — Progress Notes (Signed)
[facility]  Patient ID: Tammy Mullins,  MRN: [id number]  PCP: Maury Dus, MD  Date of Evaluation: 01/27/2020 Time spent:20 minutes  Chief Complaint:  Chief Complaint    Depression; Insomnia      HISTORY/CURRENT STATUS:  HPI For routine med check.   At [place], we increased the Seroquel to help depression symptoms. She's feeling a lot better. Is enjoying things more. Went to her nephews wedding last month and had a great time.  Energy and motivation are good.  She is not crying easily.  Not isolating.  She is sleeping well.  Denies suicidal or homicidal thoughts.  She is having more migraines.  Experienced 2 severe ones last week.  She has an appointment tomorrow morning at 7 AM with her neurologist.  In the past, she has been treated with Botox which was not helpful.  She has also had dry needling which was beneficial.  She is not sure if she needs to go back and have that done again or not.  She will discuss that with her neurologist.  Patient denies increased energy with decreased need for sleep, no increased talkativeness, no racing thoughts, no impulsivity or risky behaviors, no increased spending, no increased libido, no grandiosity. No irritability, no paranoia, no hallucinations.  She continues to have migraine headaches. She sees neurology for that and an increase in gabapentin has been helpful.  The IBS symptoms are better.  She has been eating better and that has helped a lot.  Denies dizziness, syncope, seizures, numbness, tingling, tremor, tics, unsteady gait, slurred speech, confusion. Denies muscle or joint pain, stiffness, or dystonia.  Individual Medical History/ Review of Systems: Changes? :No    Past medications for mental health diagnoses include: Cymbalta, Seroquel, Gabapentin  Allergies: Crestor [rosuvastatin calcium], Asa [aspirin], Topiramate, and Celebrex [celecoxib]  Current Medications:  Current Outpatient Medications:  .  allopurinol  (ZYLOPRIM) 300 MG tablet, TAKE 1 AND 1/2 TABLETS(450 MG) BY MOUTH DAILY, Disp: 45 tablet, Rfl: 2 .  amLODipine (NORVASC) 10 MG tablet, TAKE 1 TABLET DAILY, Disp: 90 tablet, Rfl: 1 .  aspirin 81 MG tablet, Take 81 mg by mouth daily.  , Disp: , Rfl:  .  butalbital-acetaminophen-caffeine (FIORICET) 50-325-40 MG tablet, TAKE 1 TABLET BY MOUTH EVERY 6 HOURS AS NEEDED FOR HEADACHE, Disp: 10 tablet, Rfl: 3 .  calcium carbonate (OSCAL) 1500 (600 Ca) MG TABS tablet, Take 600 mg of elemental calcium by mouth daily with breakfast., Disp: , Rfl:  .  CALCIUM-MAGNESIUM-ZINC PO, Take by mouth daily., Disp: , Rfl:  .  Cholecalciferol (VITAMIN D) 2000 UNITS CAPS, Take 2,000 Units by mouth daily. , Disp: , Rfl:  .  diclofenac Sodium (VOLTAREN) 1 % GEL, APPLY 2-4 GRAMS TO AFFECTED JOINTS UP TO FOUR TIMES DAILY, Disp: 400 g, Rfl: 0 .  dicyclomine (BENTYL) 20 MG tablet, Take 1 tablet (20 mg total) by mouth 3 (three) times daily as needed., Disp: 90 tablet, Rfl: 0 .  DULoxetine (CYMBALTA) 60 MG capsule, Take 2 capsules (120 mg total) by mouth daily., Disp: 180 capsule, Rfl: 1 .  famotidine (PEPCID) 40 MG tablet, Take 40 mg by mouth daily., Disp: , Rfl:  .  ferrous sulfate 325 (65 FE) MG tablet, Take 1 tablet (325 mg total) by mouth 2 (two) times daily with a meal., Disp: , Rfl: 3 .  fluticasone (FLONASE) 50 MCG/ACT nasal spray, Place 2 sprays into both nostrils at bedtime. (Patient taking differently: Place 2 sprays into both nostrils at bedtime as  needed for allergies. ), Disp: 48 g, Rfl: 1 .  gabapentin (NEURONTIN) 300 MG capsule, Take 2 capsules (600 mg total) by mouth 3 (three) times daily., Disp: 180 capsule, Rfl: 11 .  Garlic 916 MG CAPS, Take 300 mg by mouth daily. , Disp: , Rfl:  .  hydrochlorothiazide (HYDRODIURIL) 25 MG tablet, Take 25 mg by mouth daily., Disp: , Rfl:  .  hyoscyamine (LEVSIN) 0.125 MG tablet, Take 0.125 mg by mouth every 4 (four) hours as needed., Disp: , Rfl:  .  losartan (COZAAR) 100 MG  tablet, Take 1 tablet (100 mg total) by mouth daily., Disp: 30 tablet, Rfl: 5 .  methocarbamol (ROBAXIN) 500 MG tablet, Take 1 tablet (500 mg total) by mouth 4 (four) times daily. (Patient taking differently: Take 500 mg by mouth at bedtime as needed. ), Disp: 45 tablet, Rfl: 0 .  metoprolol succinate (TOPROL-XL) 100 MG 24 hr tablet, TAKE 1 TABLET DAILY, Disp: 90 tablet, Rfl: 1 .  omeprazole (PRILOSEC) 20 MG capsule, Take 20 mg by mouth daily., Disp: , Rfl:  .  QUEtiapine (SEROQUEL) 400 MG tablet, Take 1 tablet (400 mg total) by mouth at bedtime., Disp: 90 tablet, Rfl: 1 .  rosuvastatin (CRESTOR) 5 MG tablet, Take 5 mg by mouth once a week. (Patient not taking: Reported on 01/27/2020), Disp: , Rfl:  Medication Side Effects: none  Family Medical/ Social History: Changes?  No  MENTAL HEALTH EXAM:  There were no vitals taken for this visit.There is no height or weight on file to calculate BMI.  General Appearance: Casual, Neat, Well Groomed and Appears younger than stated age  Eye Contact:  Good  Speech:  Clear and Coherent and Normal Rate  Volume:  Normal  Mood:  Euthymic  Affect:  Appropriate  Thought Process:  Goal Directed and Descriptions of Associations: Intact  Orientation:  Full (Time, Place, and Person)  Thought Content: Logical   Suicidal Thoughts:  No  Homicidal Thoughts:  No  Memory:  WNL  Judgement:  Good  Insight:  Good  Psychomotor Activity:  Normal  Concentration:  Concentration: Good  Recall:  Good  Fund of Knowledge: Good  Language: Good  Assets:  Desire for Improvement  ADL's:  Intact  Cognition: WNL  Prognosis:  Good     DIAGNOSES:    ICD-10-CM   1. Bipolar 2 disorder (HCC)  F31.81   2. Insomnia due to other mental disorder  F51.05    F99   3. Chronic nonintractable headache, unspecified headache type  R51.9    G89.29     Receiving Psychotherapy: No    RECOMMENDATIONS:  PDMP was reviewed. I provided 20 minutes of face to face time during this  encounter.  I am glad to see her doing better!   She has an appointment with her PCP in the next few months to have her wellness check.   Continue Cymbalta 60 mg, 2 p.o. daily. Continue Seroquel 400 mg, 1 p.o. nightly. Continue Gabapentin 300 mg, 2 po tid. for another provider. Return in 3 months.  Donnal Moat, PA-C

## 2020-01-28 ENCOUNTER — Encounter: Payer: Self-pay | Admitting: Neurology

## 2020-01-28 ENCOUNTER — Ambulatory Visit: Payer: PPO | Admitting: Neurology

## 2020-01-28 VITALS — BP 125/84 | HR 99 | Ht 65.5 in | Wt 182.0 lb

## 2020-01-28 DIAGNOSIS — R519 Headache, unspecified: Secondary | ICD-10-CM

## 2020-01-28 DIAGNOSIS — Z87828 Personal history of other (healed) physical injury and trauma: Secondary | ICD-10-CM

## 2020-01-28 DIAGNOSIS — Z79899 Other long term (current) drug therapy: Secondary | ICD-10-CM

## 2020-01-28 MED ORDER — BUTALBITAL-APAP-CAFFEINE 50-325-40 MG PO TABS: 1.0000 | ORAL_TABLET | Freq: Four times a day (QID) | ORAL | 5 refills | Status: DC | PRN

## 2020-01-28 MED ORDER — AIMOVIG 70 MG/ML ~~LOC~~ SOAJ: 70.0000 mg | SUBCUTANEOUS | 11 refills | Status: DC
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
## 2020-01-29 ENCOUNTER — Other Ambulatory Visit: Payer: Self-pay | Admitting: Family Medicine

## 2020-01-29 DIAGNOSIS — Z1231 Encounter for screening mammogram for malignant neoplasm of breast: Secondary | ICD-10-CM

## 2020-01-30 ENCOUNTER — Telehealth: Payer: Self-pay | Admitting: Neurology

## 2020-01-30 DIAGNOSIS — R519 Headache, unspecified: Secondary | ICD-10-CM

## 2020-01-30 NOTE — Telephone Encounter (Signed)
I called [facility] and spoke to the pharmacist. The patient's co-pay for Aimovig 70mg  is $80. Say she is not eligible to use the patient assistance card since she is on a medicare plan. This cost is unaffordable. Is there an alternate medication to try?

## 2020-01-30 NOTE — Addendum Note (Signed)
Addended by: Marcial Pacas on: 01/30/2020 02:48 PM   Modules accepted: Orders

## 2020-01-30 NOTE — Telephone Encounter (Signed)
Pt is wanting to know if there is something else she can take besides the Erenumab-aooe (AIMOVIG) 44 MG/ML SOAJ Pt states she can not afford the injection price. Please advise.

## 2020-01-30 NOTE — Addendum Note (Signed)
Addended by: Noberto Retort C on: 01/30/2020 01:28 PM   Modules accepted: Orders

## 2020-01-30 NOTE — Telephone Encounter (Signed)
She does not wish to try Botox again. States she has previously experienced relief with dry needling. She is asking for a PT referral to [facility], [place], [place], [place] [address].

## 2020-01-30 NOTE — Telephone Encounter (Signed)
Orders Placed This Encounter  Procedures  . Ambulatory referral to Physical Therapy   PT referral to [facility], [place], [place], [place] [address].

## 2020-01-30 NOTE — Telephone Encounter (Signed)
Pt is aware the referral has been placed and she will receive a phone call for scheduling.

## 2020-01-30 NOTE — Telephone Encounter (Signed)
she is on polypharmacy already, if she wants, we may try Botox injection as headache prevention, she had  Botox before, initially it may not be helpful, but eventually it does seems to help her headache

## 2020-02-06 ENCOUNTER — Other Ambulatory Visit: Payer: Self-pay | Admitting: Rheumatology

## 2020-02-12 NOTE — Telephone Encounter (Signed)
Her PT referral was faxed to [facility] on 02/04/20. She called them to check on the status. States they could not find it but she was able to schedule an appt anyway. She would still like the referral faxed again and I will do this today.  She also needed to discuss her bill with our office. She was transferred to [facility] for assistance.

## 2020-02-12 NOTE — Telephone Encounter (Signed)
Pt called, contacted physical therapy, could not find referral, but scheduled me appt. Would like a call from the nurse to verify the referral. Want to make sure I do not have to pay for this appt.
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
## 2020-02-14 ENCOUNTER — Other Ambulatory Visit: Payer: Self-pay

## 2020-02-14 ENCOUNTER — Ambulatory Visit: Payer: PPO | Admitting: Physician Assistant

## 2020-02-14 ENCOUNTER — Encounter: Payer: Self-pay | Admitting: Physician Assistant

## 2020-02-14 ENCOUNTER — Ambulatory Visit: Payer: Self-pay

## 2020-02-14 VITALS — BP 126/83 | HR 90 | Resp 15 | Ht 65.5 in | Wt 181.0 lb

## 2020-02-14 DIAGNOSIS — M170 Bilateral primary osteoarthritis of knee: Secondary | ICD-10-CM | POA: Diagnosis not present

## 2020-02-14 DIAGNOSIS — M797 Fibromyalgia: Secondary | ICD-10-CM

## 2020-02-14 DIAGNOSIS — M8589 Other specified disorders of bone density and structure, multiple sites: Secondary | ICD-10-CM

## 2020-02-14 DIAGNOSIS — G8929 Other chronic pain: Secondary | ICD-10-CM

## 2020-02-14 DIAGNOSIS — M5030 Other cervical disc degeneration, unspecified cervical region: Secondary | ICD-10-CM

## 2020-02-14 DIAGNOSIS — Z8679 Personal history of other diseases of the circulatory system: Secondary | ICD-10-CM

## 2020-02-14 DIAGNOSIS — M5136 Other intervertebral disc degeneration, lumbar region: Secondary | ICD-10-CM

## 2020-02-14 DIAGNOSIS — M19041 Primary osteoarthritis, right hand: Secondary | ICD-10-CM | POA: Diagnosis not present

## 2020-02-14 DIAGNOSIS — M19042 Primary osteoarthritis, left hand: Secondary | ICD-10-CM

## 2020-02-14 DIAGNOSIS — G894 Chronic pain syndrome: Secondary | ICD-10-CM

## 2020-02-14 DIAGNOSIS — M1A09X0 Idiopathic chronic gout, multiple sites, without tophus (tophi): Secondary | ICD-10-CM

## 2020-02-14 DIAGNOSIS — Z5181 Encounter for therapeutic drug level monitoring: Secondary | ICD-10-CM | POA: Diagnosis not present

## 2020-02-14 DIAGNOSIS — Z8719 Personal history of other diseases of the digestive system: Secondary | ICD-10-CM

## 2020-02-14 DIAGNOSIS — R5383 Other fatigue: Secondary | ICD-10-CM

## 2020-02-14 DIAGNOSIS — F5101 Primary insomnia: Secondary | ICD-10-CM

## 2020-02-14 DIAGNOSIS — M25511 Pain in right shoulder: Secondary | ICD-10-CM | POA: Diagnosis not present

## 2020-02-14 DIAGNOSIS — F419 Anxiety disorder, unspecified: Secondary | ICD-10-CM

## 2020-02-14 MED ORDER — TRIAMCINOLONE ACETONIDE 40 MG/ML IJ SUSP
40.0000 mg | INTRAMUSCULAR | Status: AC | PRN
  Administered 2020-02-14: 40 mg via INTRA_ARTICULAR

## 2020-02-14 MED ORDER — LIDOCAINE HCL 1 % IJ SOLN
1.5000 mL | INTRAMUSCULAR | Status: AC | PRN
  Administered 2020-02-14: 1.5 mL

## 2020-02-21 ENCOUNTER — Other Ambulatory Visit: Payer: Self-pay | Admitting: Physician Assistant

## 2020-02-24 DIAGNOSIS — R519 Headache, unspecified: Secondary | ICD-10-CM | POA: Diagnosis not present

## 2020-02-24 DIAGNOSIS — M4316 Spondylolisthesis, lumbar region: Secondary | ICD-10-CM | POA: Diagnosis not present

## 2020-02-25 ENCOUNTER — Other Ambulatory Visit: Payer: Self-pay | Admitting: Physician Assistant

## 2020-02-26 DIAGNOSIS — K219 Gastro-esophageal reflux disease without esophagitis: Secondary | ICD-10-CM | POA: Diagnosis not present

## 2020-02-26 DIAGNOSIS — I10 Essential (primary) hypertension: Secondary | ICD-10-CM | POA: Diagnosis not present

## 2020-02-26 DIAGNOSIS — M109 Gout, unspecified: Secondary | ICD-10-CM | POA: Diagnosis not present

## 2020-02-26 DIAGNOSIS — D509 Iron deficiency anemia, unspecified: Secondary | ICD-10-CM | POA: Diagnosis not present

## 2020-02-26 DIAGNOSIS — M8580 Other specified disorders of bone density and structure, unspecified site: Secondary | ICD-10-CM | POA: Diagnosis not present

## 2020-02-26 DIAGNOSIS — E1169 Type 2 diabetes mellitus with other specified complication: Secondary | ICD-10-CM | POA: Diagnosis not present

## 2020-02-26 DIAGNOSIS — E2839 Other primary ovarian failure: Secondary | ICD-10-CM | POA: Diagnosis not present

## 2020-02-26 DIAGNOSIS — K589 Irritable bowel syndrome without diarrhea: Secondary | ICD-10-CM | POA: Diagnosis not present

## 2020-02-26 DIAGNOSIS — Z23 Encounter for immunization: Secondary | ICD-10-CM | POA: Diagnosis not present

## 2020-02-26 DIAGNOSIS — Z0000 Encounter for general adult medical examination without abnormal findings: Secondary | ICD-10-CM | POA: Diagnosis not present

## 2020-02-26 DIAGNOSIS — E7800 Pure hypercholesterolemia, unspecified: Secondary | ICD-10-CM | POA: Diagnosis not present

## 2020-03-02 ENCOUNTER — Telehealth: Payer: Self-pay | Admitting: *Deleted

## 2020-03-02 NOTE — Telephone Encounter (Signed)
Labs received from [facility] Reviewed by Sherron Ales, PA-C Drawn on 02/26/2020  Lipid Panel, CBC, Hgb A1C, CMP  Chol./HDL 4.4 NHDL 150 LDL Chol. Calc. 124 Hgb 11.9 Hct 35.2 Hgb A1C 6.4 Glucose 111 Calcium 10.4  Patient is on Allopurinol 450 mg daily.

## 2020-03-05 ENCOUNTER — Other Ambulatory Visit: Payer: Self-pay | Admitting: Physician Assistant

## 2020-03-12 ENCOUNTER — Ambulatory Visit: Payer: PPO

## 2020-03-17 ENCOUNTER — Ambulatory Visit: Payer: PPO

## 2020-04-06 ENCOUNTER — Telehealth: Payer: Self-pay | Admitting: Neurology

## 2020-04-06 NOTE — Telephone Encounter (Signed)
Pt is aware of her upcoming appointment but states her headaches are worse.  Pt tried Acupuncture but it left her head hurting for days after.  Pt states she is depressed and would like something called in for her

## 2020-04-06 NOTE — Telephone Encounter (Signed)
She has continued having daily headaches. She is using ice packs and rest. She is also going through her #10 tablets per month of Fioricet, in addition to using Tylenol 500mg  every six hours. States her PCP is treating her depression. She understands she is taking too much medication but is frustrated with the pain. OTC NSAIDS give her temporary relief.  Her appt has been moved to an earlier date to discuss treatment/medications.

## 2020-04-07 ENCOUNTER — Ambulatory Visit: Payer: PPO | Admitting: Neurology

## 2020-04-07 ENCOUNTER — Encounter: Payer: Self-pay | Admitting: Neurology

## 2020-04-07 ENCOUNTER — Other Ambulatory Visit: Payer: Self-pay

## 2020-04-07 VITALS — BP 159/88 | HR 82 | Ht 65.5 in | Wt 180.0 lb

## 2020-04-07 DIAGNOSIS — Z79899 Other long term (current) drug therapy: Secondary | ICD-10-CM

## 2020-04-07 DIAGNOSIS — G894 Chronic pain syndrome: Secondary | ICD-10-CM

## 2020-04-07 DIAGNOSIS — R519 Headache, unspecified: Secondary | ICD-10-CM

## 2020-04-07 MED ORDER — ZONISAMIDE 25 MG PO CAPS: ORAL_CAPSULE | ORAL | 5 refills | Status: DC
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
## 2020-04-07 NOTE — Patient Instructions (Addendum)
Add on Zonegran 25 mg at bedtime for 1 week, then take 2 tablets at bedtime for 1 week, then take 3 tablets at bedtime Can stay on gabapentin Take Fioricet only for severe headaches  Stop the multi daily use of Tylenol  See you back in 4 months  Zonisamide capsules What is this medicine? ZONISAMIDE (zoe NIS a mide) is used to control partial seizures in adults with epilepsy. This medicine may be used for other purposes; ask your health care provider or pharmacist if you have questions. COMMON BRAND NAME(S): Zonegran What should I tell my health care provider before I take this medicine? They need to know if you have any of these conditions:  kidney disease  liver disease  low level of bicarbonate in the blood  lung or breathing disease, like asthma  suicidal thoughts, plans, or attempt; a previous suicide attempt by you or a family member  an unusual or allergic reaction to zonisamide, sulfa drugs, other medicines, foods, dyes, or preservatives  pregnant or trying to get pregnant  breast-feeding How should I use this medicine? Take this medicine by mouth with a glass of water. Follow the directions on the prescription label. Do not cut, crush or chew this medicine. Swallow the capsules whole. You can take this medicine with or without food. If it upsets your stomach, take it with food. Take your medicine at regular intervals. Do not take it more often than directed. Do not stop taking except on your doctor's advice. A special MedGuide will be given to you by the pharmacist with each prescription and refill. Be sure to read this information carefully each time. Talk to your pediatrician regarding the use of this medicine in children. While this drug may be prescribed for children as young as 16 years for selected conditions, precautions do apply. Overdosage: If you think you have taken too much of this medicine contact a poison control center or emergency room at once. NOTE: This  medicine is only for you. Do not share this medicine with others. What if I miss a dose? If you miss a dose, take it as soon as you can. If it is almost time for your next dose, take only that dose. Do not take double or extra doses. What may interact with this medicine? This medicine may interact with the following medications:  acetazolamide  antihistamines for allergy, cough, and cold  atropine  certain medicines for anxiety or sleep  certain medicines for bladder problems like oxybutynin, tolterodine  certain medicines for depression like amitriptyline, fluoxetine, sertraline  certain medicines for seizures like carbamazepine, phenobarbital, phenytoin, primidone, topiramate, valproic acid  certain medicines for stomach problems like dicyclomine, hyoscyamine  certain medicines for travel sickness like scopolamine  certain medicines for Parkinson's disease like benztropine, trihexyphenidyl  dichlorphenamide  general anesthetics like halothane, isoflurane, methoxyflurane, propofol  ipratropium  medicines that relax muscles for surgery  narcotic medicines for pain  phenothiazines like chlorpromazine, mesoridazine, prochlorperazine, thioridazine  rifampin This list may not describe all possible interactions. Give your health care provider a list of all the medicines, herbs, non-prescription drugs, or dietary supplements you use. Also tell them if you smoke, drink alcohol, or use illegal drugs. Some items may interact with your medicine. What should I watch for while using this medicine? Visit your health care professional for regular checks on your progress. Tell your health care professional if your symptoms do not start to get better or if they get worse. Wear a medical ID bracelet  or chain. Carry a card that describes your disease and details of your medicine and dosage times. Do not stop taking except on your health care professional's advice. You may develop a severe  reaction. Your health care professional will tell you how much medicine to take. You may get drowsy or dizzy. Do not drive, use machinery, or do anything that needs mental alertness until you know how this medicine affects you. Do not stand up or sit up quickly, especially if you are an older patient. This reduces the risk of dizzy or fainting spells. Alcohol may interfere with the effect of this medicine. Avoid alcoholic drinks. Tell your health care professional right away if you have any change in your eyesight. This medicine can reduce the response of your body to heat or cold. Dress warm in cold weather and stay hydrated in hot weather. If possible, avoid extreme temperatures like saunas, hot tubs, very hot or cold showers, or activities that can cause dehydration such as vigorous exercise. This medicine may cause serious skin reactions. They can happen weeks to months after starting the medicine. Contact your health care provider right away if you notice fevers or flu-like symptoms with a rash. The rash may be red or purple and then turn into blisters or peeling of the skin. Or, you might notice a red rash with swelling of the face, lips or lymph nodes in your neck or under your arms. If you or your family notice any changes in your behavior, such as new or worsening depression, thoughts of harming yourself, anxiety, other unusual or disturbing thoughts, or memory loss, call your health care professional right away. What side effects may I notice from receiving this medicine? Side effects that you should report to your doctor or health care professional as soon as possible:  allergic reactions like skin rash, itching or hives, swelling of the face, lips, or tongue  blood in the urine  changes in vision  confusion  fever  hallucinations  loss of appetite  loss of memory  pain in the lower back or side  pain when urinating  rash, fever, and swollen lymph nodes  redness, blistering,  peeling or loosening of the skin, including inside the mouth  signs and symptoms of increased acid in the body like breathing fast; fast heartbeat; headache; confusion; unusually weak or tired; nausea, vomiting  signs and symptoms of low red blood cells or anemia such as unusually weak or tired; feeling faint or lightheaded; falls; breathing problems  suicidal thoughts, mood changes  unusual sweating Side effects that usually do not require medical attention (report to your doctor or health care professional if they continue or are bothersome):  dizziness  headache  irritable  nausea  tiredness  trouble sleeping This list may not describe all possible side effects. Call your doctor for medical advice about side effects. You may report side effects to FDA at 1-800-FDA-1088. Where should I keep my medicine? Keep out of reach of children. Store at room temperature between 15 and 30 degrees C (59 and 86 degrees F). Keep in a dry place protected from light. Throw away any unused medicine after the expiration date. NOTE: This sheet is a summary. It may not cover all possible information. If you have questions about this medicine, talk to your doctor, pharmacist, or health care provider.  2021 Elsevier/Gold Standard (2018-06-21 15:13:05)

## 2020-04-09 ENCOUNTER — Telehealth: Payer: Self-pay | Admitting: Surgery

## 2020-04-09 NOTE — Telephone Encounter (Signed)
03/21/2017 & 03/08/2018 faxed to [employer] [phone]

## 2020-04-27 ENCOUNTER — Ambulatory Visit: Payer: PPO | Admitting: Physician Assistant

## 2020-04-29 ENCOUNTER — Encounter: Payer: Self-pay | Admitting: Physician Assistant

## 2020-04-29 ENCOUNTER — Other Ambulatory Visit: Payer: Self-pay

## 2020-04-29 ENCOUNTER — Ambulatory Visit (INDEPENDENT_AMBULATORY_CARE_PROVIDER_SITE_OTHER): Payer: PPO | Admitting: Physician Assistant

## 2020-04-29 DIAGNOSIS — F5105 Insomnia due to other mental disorder: Secondary | ICD-10-CM

## 2020-04-29 DIAGNOSIS — F3181 Bipolar II disorder: Secondary | ICD-10-CM | POA: Diagnosis not present

## 2020-04-29 DIAGNOSIS — F99 Mental disorder, not otherwise specified: Secondary | ICD-10-CM

## 2020-04-29 MED ORDER — QUETIAPINE FUMARATE 400 MG PO TABS: ORAL_TABLET | ORAL | 3 refills | Status: DC

## 2020-04-29 NOTE — Progress Notes (Signed)
[facility]  Patient ID: Tammy Mullins,  MRN: [id number]  PCP: Maury Dus, MD  Date of Evaluation: 04/29/2020 Time spent:30 minutes  Chief Complaint:  Chief Complaint    Depression      HISTORY/CURRENT STATUS:  HPI For routine med check.   Meds are working well. Able to enjoy things, energy and motivation are good. Was started on Zonegran by Neuro NP and migraines aren't as severe.  Not isolating.  Does not cry easily.  Appetite is normal and weight stable.  Sleeps good most of the time.  Not having much anxiety.  No suicidal or homicidal thoughts.  Patient denies increased energy with decreased need for sleep, no increased talkativeness, no racing thoughts, no impulsivity or risky behaviors, no increased spending, no increased libido, no grandiosity. No irritability, no paranoia, no hallucinations.  Denies dizziness, syncope, seizures, numbness, tingling, tremor, tics, unsteady gait, slurred speech, confusion. Denies muscle or joint pain, stiffness, or dystonia.  Individual Medical History/ Review of Systems: Changes? :No    Past medications for mental health diagnoses include: Cymbalta, Seroquel, Gabapentin  Allergies: Crestor [rosuvastatin calcium], Asa [aspirin], Topiramate, and Celebrex [celecoxib]  Current Medications:  Current Outpatient Medications:  .  acetaminophen (TYLENOL) 500 MG tablet, Take 500 mg by mouth as needed., Disp: , Rfl:  .  allopurinol (ZYLOPRIM) 300 MG tablet, TAKE 1 AND 1/2 TABLETS(450 MG) BY MOUTH DAILY, Disp: 45 tablet, Rfl: 2 .  amLODipine (NORVASC) 10 MG tablet, TAKE 1 TABLET DAILY, Disp: 90 tablet, Rfl: 1 .  aspirin 81 MG tablet, Take 81 mg by mouth daily., Disp: , Rfl:  .  butalbital-acetaminophen-caffeine (FIORICET) 50-325-40 MG tablet, Take 1 tablet by mouth every 6 (six) hours as needed for migraine., Disp: 10 tablet, Rfl: 5 .  calcium carbonate (OSCAL) 1500 (600 Ca) MG TABS tablet, Take 600 mg of elemental calcium by mouth  daily with breakfast., Disp: , Rfl:  .  CALCIUM-MAGNESIUM-ZINC PO, Take by mouth daily., Disp: , Rfl:  .  Cholecalciferol (VITAMIN D) 2000 UNITS CAPS, Take 2,000 Units by mouth daily. , Disp: , Rfl:  .  diclofenac Sodium (VOLTAREN) 1 % GEL, APPLY 2-4 GRAMS TO AFFECTED JOINTS UP TO FOUR TIMES DAILY, Disp: 400 g, Rfl: 0 .  dicyclomine (BENTYL) 20 MG tablet, Take 1 tablet (20 mg total) by mouth 3 (three) times daily as needed., Disp: 90 tablet, Rfl: 0 .  DULoxetine (CYMBALTA) 60 MG capsule, TAKE 2 CAPSULES BY MOUTH ONCE DAILY, Disp: 180 capsule, Rfl: 1 .  famotidine (PEPCID) 40 MG tablet, Take 40 mg by mouth daily., Disp: , Rfl:  .  ferrous sulfate 325 (65 FE) MG tablet, Take 1 tablet (325 mg total) by mouth 2 (two) times daily with a meal. (Patient taking differently: Take 325 mg by mouth daily with breakfast.), Disp: , Rfl: 3 .  fluticasone (FLONASE) 50 MCG/ACT nasal spray, Place 1 spray into both nostrils as needed for allergies or rhinitis., Disp: , Rfl:  .  gabapentin (NEURONTIN) 300 MG capsule, Take 2 capsules (600 mg total) by mouth 3 (three) times daily., Disp: 180 capsule, Rfl: 11 .  Garlic 119 MG CAPS, Take 300 mg by mouth daily. , Disp: , Rfl:  .  hydrochlorothiazide (HYDRODIURIL) 25 MG tablet, Take 25 mg by mouth daily., Disp: , Rfl:  .  hyoscyamine (LEVSIN) 0.125 MG tablet, Take 0.125 mg by mouth every 4 (four) hours as needed., Disp: , Rfl:  .  losartan (COZAAR) 100 MG tablet, Take 1 tablet (100 mg  total) by mouth daily., Disp: 30 tablet, Rfl: 5 .  metoprolol succinate (TOPROL-XL) 100 MG 24 hr tablet, TAKE 1 TABLET DAILY, Disp: 90 tablet, Rfl: 1 .  omeprazole (PRILOSEC) 20 MG capsule, Take 20 mg by mouth daily., Disp: , Rfl:  .  rosuvastatin (CRESTOR) 5 MG tablet, Take 5 mg by mouth once a week. , Disp: , Rfl:  .  zonisamide (ZONEGRAN) 25 MG capsule, Take 1 tablet at bedtime for 1 week, then take 2 tablets at bedtime for 1 week, then take 3 tablets at bedtime, Disp: 90 capsule, Rfl: 5 .   QUEtiapine (SEROQUEL) 400 MG tablet, TAKE 1 TABLET(400 MG) BY MOUTH AT BEDTIME, Disp: 90 tablet, Rfl: 3 Medication Side Effects: none  Family Medical/ Social History: Changes?  No  MENTAL HEALTH EXAM:  There were no vitals taken for this visit.There is no height or weight on file to calculate BMI.  General Appearance: Casual, Neat, Well Groomed and Appears younger than stated age  Eye Contact:  Good  Speech:  Clear and Coherent and Normal Rate  Volume:  Normal  Mood:  Euthymic  Affect:  Appropriate  Thought Process:  Goal Directed and Descriptions of Associations: Intact  Orientation:  Full (Time, Place, and Person)  Thought Content: Logical   Suicidal Thoughts:  No  Homicidal Thoughts:  No  Memory:  WNL  Judgement:  Good  Insight:  Good  Psychomotor Activity:  Normal  Concentration:  Concentration: Good  Recall:  Good  Fund of Knowledge: Good  Language: Good  Assets:  Desire for Improvement  ADL's:  Intact  Cognition: WNL  Prognosis:  Good   Labs 02/26/2020 CBC normal, CMP normal, hemoglobin A1c 6.4, lipid profile total cholesterol 194, triglycerides 142, LDL 124, HDL 44  DIAGNOSES:    ICD-10-CM   1. Bipolar 2 disorder (HCC)  F31.81   2. Insomnia due to other mental disorder  F51.05    F99     Receiving Psychotherapy: No    RECOMMENDATIONS:  PDMP was reviewed. I provided 30 minutes of face to face time during this encounter, including time spent reviewing records and labs.  She is doing well overall so no med changes are necessary. Continue Cymbalta 60 mg, 2 p.o. daily. Continue Seroquel 400 mg, 1 p.o. nightly. Continue gabapentin 300 mg, 2 p.o. 3 times daily. Continue Zonegran 25 mg, 3 p.o. nightly.  Per neuro. Return in 3 months.  Donnal Moat, PA-C

## 2020-05-01 ENCOUNTER — Inpatient Hospital Stay: Admission: RE | Admit: 2020-05-01 | Payer: PPO | Source: Ambulatory Visit

## 2020-05-08 ENCOUNTER — Other Ambulatory Visit: Payer: Self-pay | Admitting: Neurology

## 2020-06-24 ENCOUNTER — Ambulatory Visit
Admission: RE | Admit: 2020-06-24 | Discharge: 2020-06-24 | Disposition: A | Discharge status: Home / Self Care | Payer: PPO | Source: Ambulatory Visit | Attending: Family Medicine | Admitting: Family Medicine

## 2020-06-24 ENCOUNTER — Other Ambulatory Visit: Payer: Self-pay

## 2020-06-24 DIAGNOSIS — Z1231 Encounter for screening mammogram for malignant neoplasm of breast: Secondary | ICD-10-CM

## 2020-06-25 DIAGNOSIS — R059 Cough, unspecified: Secondary | ICD-10-CM | POA: Diagnosis not present

## 2020-06-25 DIAGNOSIS — J40 Bronchitis, not specified as acute or chronic: Secondary | ICD-10-CM | POA: Diagnosis not present
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
## 2020-07-07 DIAGNOSIS — M5441 Lumbago with sciatica, right side: Secondary | ICD-10-CM | POA: Diagnosis not present

## 2020-07-15 ENCOUNTER — Ambulatory Visit: Payer: PPO | Admitting: Rheumatology

## 2020-07-15 DIAGNOSIS — Z8719 Personal history of other diseases of the digestive system: Secondary | ICD-10-CM

## 2020-07-15 DIAGNOSIS — M170 Bilateral primary osteoarthritis of knee: Secondary | ICD-10-CM

## 2020-07-15 DIAGNOSIS — M5030 Other cervical disc degeneration, unspecified cervical region: Secondary | ICD-10-CM

## 2020-07-15 DIAGNOSIS — M8589 Other specified disorders of bone density and structure, multiple sites: Secondary | ICD-10-CM

## 2020-07-15 DIAGNOSIS — M1A09X0 Idiopathic chronic gout, multiple sites, without tophus (tophi): Secondary | ICD-10-CM

## 2020-07-15 DIAGNOSIS — Z8679 Personal history of other diseases of the circulatory system: Secondary | ICD-10-CM

## 2020-07-15 DIAGNOSIS — M5136 Other intervertebral disc degeneration, lumbar region: Secondary | ICD-10-CM

## 2020-07-15 DIAGNOSIS — M19041 Primary osteoarthritis, right hand: Secondary | ICD-10-CM

## 2020-07-15 DIAGNOSIS — R5383 Other fatigue: Secondary | ICD-10-CM

## 2020-07-15 DIAGNOSIS — F32A Depression, unspecified: Secondary | ICD-10-CM

## 2020-07-15 DIAGNOSIS — Z5181 Encounter for therapeutic drug level monitoring: Secondary | ICD-10-CM

## 2020-07-15 DIAGNOSIS — G894 Chronic pain syndrome: Secondary | ICD-10-CM

## 2020-07-15 DIAGNOSIS — F5101 Primary insomnia: Secondary | ICD-10-CM

## 2020-07-15 DIAGNOSIS — M797 Fibromyalgia: Secondary | ICD-10-CM

## 2020-07-28 ENCOUNTER — Ambulatory Visit: Payer: PPO | Admitting: Neurology

## 2020-07-29 ENCOUNTER — Ambulatory Visit: Payer: PPO | Admitting: Physician Assistant

## 2020-08-04 DIAGNOSIS — M5441 Lumbago with sciatica, right side: Secondary | ICD-10-CM | POA: Diagnosis not present

## 2020-08-05 ENCOUNTER — Encounter: Payer: Self-pay | Admitting: Neurology

## 2020-08-05 ENCOUNTER — Ambulatory Visit: Payer: PPO | Admitting: Neurology

## 2020-08-05 VITALS — BP 160/89 | HR 103 | Ht 65.5 in | Wt 172.0 lb

## 2020-08-05 DIAGNOSIS — Z79899 Other long term (current) drug therapy: Secondary | ICD-10-CM | POA: Diagnosis not present

## 2020-08-05 DIAGNOSIS — G43719 Chronic migraine without aura, intractable, without status migrainosus: Secondary | ICD-10-CM

## 2020-08-05 MED ORDER — ZONISAMIDE 100 MG PO CAPS: 100.0000 mg | ORAL_CAPSULE | Freq: Every day | ORAL | 3 refills | Status: DC

## 2020-08-05 MED ORDER — BUTALBITAL-APAP-CAFFEINE 50-325-40 MG PO TABS: 1.0000 | ORAL_TABLET | Freq: Four times a day (QID) | ORAL | 3 refills | Status: DC | PRN

## 2020-08-05 MED ORDER — GABAPENTIN 300 MG PO CAPS: 600.0000 mg | ORAL_CAPSULE | Freq: Three times a day (TID) | ORAL | 5 refills | Status: DC
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
## 2020-08-05 NOTE — Patient Instructions (Signed)
Increase Zonegran to 100 mg at bedtime Keep other medications  Take Fioricet for severe headaches  See you back in 6-8 months

## 2020-08-12 DIAGNOSIS — M5441 Lumbago with sciatica, right side: Secondary | ICD-10-CM | POA: Diagnosis not present

## 2020-08-12 DIAGNOSIS — M545 Low back pain, unspecified: Secondary | ICD-10-CM | POA: Diagnosis not present

## 2020-08-18 DIAGNOSIS — M5441 Lumbago with sciatica, right side: Secondary | ICD-10-CM | POA: Diagnosis not present
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
## 2020-08-28 ENCOUNTER — Ambulatory Visit: Payer: PPO | Admitting: Physician Assistant

## 2020-08-28 ENCOUNTER — Encounter: Payer: Self-pay | Admitting: Physician Assistant

## 2020-08-28 ENCOUNTER — Other Ambulatory Visit: Payer: Self-pay

## 2020-08-28 DIAGNOSIS — R519 Headache, unspecified: Secondary | ICD-10-CM | POA: Diagnosis not present

## 2020-08-28 DIAGNOSIS — G4710 Hypersomnia, unspecified: Secondary | ICD-10-CM

## 2020-08-28 DIAGNOSIS — G894 Chronic pain syndrome: Secondary | ICD-10-CM

## 2020-08-28 DIAGNOSIS — F3181 Bipolar II disorder: Secondary | ICD-10-CM

## 2020-08-28 DIAGNOSIS — G8929 Other chronic pain: Secondary | ICD-10-CM | POA: Diagnosis not present

## 2020-08-28 DIAGNOSIS — R5383 Other fatigue: Secondary | ICD-10-CM

## 2020-08-28 MED ORDER — DULOXETINE HCL 60 MG PO CPEP: 120.0000 mg | ORAL_CAPSULE | Freq: Every day | ORAL | 1 refills | Status: DC

## 2020-08-28 MED ORDER — BUPROPION HCL ER (XL) 150 MG PO TB24
150.0000 mg | ORAL_TABLET | Freq: Every morning | ORAL | 1 refills | Status: DC
Start: 2020-08-28 — End: 2020-11-09

## 2020-08-28 NOTE — Progress Notes (Signed)
[facility] Med Check  Patient ID: Tammy Mullins,  MRN: [id number]  PCP: Maury Dus, MD  Date of Evaluation: 08/28/2020 Time spent:40 minutes  Chief Complaint:  Chief Complaint   Anxiety; Depression; Insomnia; Follow-up      HISTORY/CURRENT STATUS:  HPI For routine med check.   Doesn't feel as good as she'd like. Sometimes feels 'down' for no reason. Positive anhedonia, feels tired all the time, no energy/motivation. Watches tv, but sometimes will get up and clean house a little. Not like she used to though. She'll have labs soon for anemia. She has always sent cards to everybody, but this year for Easter, Mother's Day, Father's Day, didn't send any, 'I just couldn't get into it.' Also has been missing family functions b/c doesn't fee like going to things. Has times when she'll stay in the house for a week and not go anywhere. Feels like she sleeps too much. Appetite is normal, weight stable. No trouble falling asleep. Doesn't cry easily. Denies anxiety. No SI/HI.  Patient denies increased energy with decreased need for sleep, no increased talkativeness, no racing thoughts, no impulsivity or risky behaviors, no increased spending, no increased libido, no grandiosity, no increased irritability or anger, no paranoia, and no hallucinations.  Review of Systems  Constitutional:  Positive for malaise/fatigue.  HENT: Negative.    Eyes: Negative.   Respiratory: Negative.    Cardiovascular: Negative.   Gastrointestinal: Negative.   Genitourinary: Negative.   Musculoskeletal:  Positive for back pain.  Skin: Negative.   Neurological:  Positive for headaches.  Endo/Heme/Allergies: Negative.   Psychiatric/Behavioral:  Positive for depression.        See HPI.    Individual Medical History/ Review of Systems: Changes? :Yes   continued back pain but worse now, will be having epidural in lumbar spine sometime soon.    Past medications for mental health diagnoses include: Cymbalta,  Seroquel, Gabapentin, Wellbutrin  Allergies: Crestor [rosuvastatin calcium], Asa [aspirin], Topiramate, and Celebrex [celecoxib]  Current Medications:  Current Outpatient Medications:    acetaminophen (TYLENOL) 500 MG tablet, Take 500 mg by mouth as needed., Disp: , Rfl:    allopurinol (ZYLOPRIM) 300 MG tablet, TAKE 1 AND 1/2 TABLETS(450 MG) BY MOUTH DAILY, Disp: 45 tablet, Rfl: 2   amLODipine (NORVASC) 10 MG tablet, TAKE 1 TABLET DAILY, Disp: 90 tablet, Rfl: 1   aspirin 81 MG tablet, Take 81 mg by mouth daily., Disp: , Rfl:    buPROPion (WELLBUTRIN XL) 150 MG 24 hr tablet, Take 1 tablet (150 mg total) by mouth every morning., Disp: 30 tablet, Rfl: 1   butalbital-acetaminophen-caffeine (FIORICET) 50-325-40 MG tablet, Take 1 tablet by mouth every 6 (six) hours as needed for migraine., Disp: 10 tablet, Rfl: 3   calcium carbonate (OSCAL) 1500 (600 Ca) MG TABS tablet, Take 600 mg of elemental calcium by mouth daily with breakfast., Disp: , Rfl:    CALCIUM-MAGNESIUM-ZINC PO, Take by mouth daily., Disp: , Rfl:    Cholecalciferol (VITAMIN D) 2000 UNITS CAPS, Take 2,000 Units by mouth daily. , Disp: , Rfl:    diclofenac Sodium (VOLTAREN) 1 % GEL, APPLY 2-4 GRAMS TO AFFECTED JOINTS UP TO FOUR TIMES DAILY, Disp: 400 g, Rfl: 0   dicyclomine (BENTYL) 20 MG tablet, Take 1 tablet (20 mg total) by mouth 3 (three) times daily as needed., Disp: 90 tablet, Rfl: 0   famotidine (PEPCID) 40 MG tablet, Take 40 mg by mouth daily., Disp: , Rfl:    fluticasone (FLONASE) 50 MCG/ACT nasal spray, Place 1  spray into both nostrils as needed for allergies or rhinitis., Disp: , Rfl:    gabapentin (NEURONTIN) 300 MG capsule, Take 2 capsules (600 mg total) by mouth 3 (three) times daily., Disp: 180 capsule, Rfl: 5   hydrochlorothiazide (HYDRODIURIL) 25 MG tablet, Take 25 mg by mouth daily., Disp: , Rfl:    hyoscyamine (LEVSIN) 0.125 MG tablet, Take 0.125 mg by mouth every 4 (four) hours as needed., Disp: , Rfl:    losartan  (COZAAR) 100 MG tablet, Take 1 tablet (100 mg total) by mouth daily., Disp: 30 tablet, Rfl: 5   metoprolol succinate (TOPROL-XL) 100 MG 24 hr tablet, TAKE 1 TABLET DAILY, Disp: 90 tablet, Rfl: 1   omeprazole (PRILOSEC) 20 MG capsule, Take 20 mg by mouth daily., Disp: , Rfl:    QUEtiapine (SEROQUEL) 400 MG tablet, TAKE 1 TABLET(400 MG) BY MOUTH AT BEDTIME, Disp: 90 tablet, Rfl: 3   zonisamide (ZONEGRAN) 100 MG capsule, Take 1 capsule (100 mg total) by mouth at bedtime., Disp: 90 capsule, Rfl: 3   DULoxetine (CYMBALTA) 60 MG capsule, Take 2 capsules (120 mg total) by mouth daily., Disp: 180 capsule, Rfl: 1   ferrous sulfate 325 (65 FE) MG tablet, Take 1 tablet (325 mg total) by mouth 2 (two) times daily with a meal. (Patient taking differently: Take 325 mg by mouth daily with breakfast.), Disp: , Rfl: 3   Garlic 923 MG CAPS, Take 300 mg by mouth daily.  (Patient not taking: Reported on 08/28/2020), Disp: , Rfl:    rosuvastatin (CRESTOR) 5 MG tablet, Take 5 mg by mouth once a week.  (Patient not taking: Reported on 08/28/2020), Disp: , Rfl:  Medication Side Effects: none  Family Medical/ Social History: Changes?  No  MENTAL HEALTH EXAM:  There were no vitals taken for this visit.There is no height or weight on file to calculate BMI.  General Appearance: Casual, Neat, Well Groomed and Appears younger than stated age  Eye Contact:  Good  Speech:  Clear and Coherent and Normal Rate  Volume:  Normal  Mood:  Euthymic  Affect:  Appropriate  Thought Process:  Goal Directed and Descriptions of Associations: Intact  Orientation:  Full (Time, Place, and Person)  Thought Content: Logical   Suicidal Thoughts:  No  Homicidal Thoughts:  No  Memory:  WNL  Judgement:  Good  Insight:  Good  Psychomotor Activity:  Normal  Concentration:  Concentration: Good  Recall:  Good  Fund of Knowledge: Good  Language: Good  Assets:  Desire for Improvement  ADL's:  Intact  Cognition: WNL  Prognosis:  Good   Labs  02/26/2020 CBC normal, CMP normal, hemoglobin A1c 6.4, lipid profile total cholesterol 194, triglycerides 142, LDL 124, HDL 44  DIAGNOSES:    ICD-10-CM   1. Bipolar 2 disorder (HCC)  F31.81     2. Chronic nonintractable headache, unspecified headache type  R51.9    G89.29     3. Hypersomnolent  G47.10     4. Chronic pain syndrome  G89.4     5. Fatigue, unspecified type  R53.83        Receiving Psychotherapy: No    RECOMMENDATIONS:  PDMP was reviewed.  I provided 40 minutes of face to face time during this encounter, including time spent before and after the visit in records review, medical decision making, and charting.  We discussed the depression.  She has chronic back pain but that has worsened and she will be having injections in her back sometime  soon.  The chronic pain from that as well as migraines can make depression worse.  She is on the maximum dose of Cymbalta, and on the Seroquel it can help depression but because she is already so tired I feel like increasing that would make the fatigue worse.  I recommend retrying Wellbutrin.  She has no history of seizures.  She has taken that at some point in the past but does not remember if it helped, cause side effects, or [place].  She is willing to retry it.  Discussed benefits, risks and side effects and she accepts. Rheumatologist and or PCP follows her labs.  She has not had any since 02/26/2020. Start Wellbutrin XL 150 mg, 1 p.o. every morning. Continue Cymbalta 60 mg, 2 p.o. daily. Continue Seroquel 400 mg, 1 p.o. nightly. Continue gabapentin 300 mg, 2 p.o. 3 times daily. Continue Zonegran 25 mg, 3 p.o. nightly.  Per neuro. Return in 2 months.  Donnal Moat, PA-C

## 2020-09-01 ENCOUNTER — Ambulatory Visit: Payer: PPO | Admitting: Physician Assistant

## 2020-09-01 ENCOUNTER — Encounter: Payer: Self-pay | Admitting: Physician Assistant

## 2020-09-01 ENCOUNTER — Other Ambulatory Visit: Payer: Self-pay

## 2020-09-01 VITALS — BP 121/79 | HR 80 | Ht 65.5 in | Wt 175.8 lb

## 2020-09-01 DIAGNOSIS — M170 Bilateral primary osteoarthritis of knee: Secondary | ICD-10-CM

## 2020-09-01 DIAGNOSIS — M19042 Primary osteoarthritis, left hand: Secondary | ICD-10-CM

## 2020-09-01 DIAGNOSIS — M5030 Other cervical disc degeneration, unspecified cervical region: Secondary | ICD-10-CM

## 2020-09-01 DIAGNOSIS — M1A09X0 Idiopathic chronic gout, multiple sites, without tophus (tophi): Secondary | ICD-10-CM

## 2020-09-01 DIAGNOSIS — G8929 Other chronic pain: Secondary | ICD-10-CM

## 2020-09-01 DIAGNOSIS — R5383 Other fatigue: Secondary | ICD-10-CM

## 2020-09-01 DIAGNOSIS — M25511 Pain in right shoulder: Secondary | ICD-10-CM | POA: Diagnosis not present

## 2020-09-01 DIAGNOSIS — F419 Anxiety disorder, unspecified: Secondary | ICD-10-CM

## 2020-09-01 DIAGNOSIS — F32A Depression, unspecified: Secondary | ICD-10-CM

## 2020-09-01 DIAGNOSIS — M797 Fibromyalgia: Secondary | ICD-10-CM | POA: Diagnosis not present

## 2020-09-01 DIAGNOSIS — M19041 Primary osteoarthritis, right hand: Secondary | ICD-10-CM

## 2020-09-01 DIAGNOSIS — Z8679 Personal history of other diseases of the circulatory system: Secondary | ICD-10-CM

## 2020-09-01 DIAGNOSIS — M8589 Other specified disorders of bone density and structure, multiple sites: Secondary | ICD-10-CM

## 2020-09-01 DIAGNOSIS — Z5181 Encounter for therapeutic drug level monitoring: Secondary | ICD-10-CM

## 2020-09-01 DIAGNOSIS — M5136 Other intervertebral disc degeneration, lumbar region: Secondary | ICD-10-CM

## 2020-09-01 DIAGNOSIS — F5101 Primary insomnia: Secondary | ICD-10-CM

## 2020-09-01 DIAGNOSIS — G894 Chronic pain syndrome: Secondary | ICD-10-CM | POA: Diagnosis not present

## 2020-09-01 DIAGNOSIS — Z8719 Personal history of other diseases of the digestive system: Secondary | ICD-10-CM

## 2020-09-01 NOTE — Patient Instructions (Signed)
Hand Exercises Hand exercises can be helpful for almost anyone. These exercises can strengthen the hands, improve flexibility and movement, and increase blood flow to the hands. These results can make work and daily tasks easier. Hand exercises can be especially helpful for people who have joint pain from arthritis or have nerve damage from overuse (carpal tunnel syndrome). These exercises can also help people who have injured a hand. Exercises Most of these hand exercises are gentle stretching and motion exercises. It is usually safe to do them often throughout the day. Warming up your hands before exercise may help to reduce stiffness. You can do this with gentle massage orby placing your hands in warm water for 10-15 minutes. It is normal to feel some stretching, pulling, tightness, or mild discomfort as you begin new exercises. This will gradually improve. Stop an exercise right away if you feel sudden, severe pain or your pain gets worse. Ask your healthcare provider which exercises are best for you. Knuckle bend or "claw" fist Stand or sit with your arm, hand, and all five fingers pointed straight up. Make sure to keep your wrist straight during the exercise. Gently bend your fingers down toward your palm until the tips of your fingers are touching the top of your palm. Keep your big knuckle straight and just bend the small knuckles in your fingers. Hold this position for __________ seconds. Straighten (extend) your fingers back to the starting position. Repeat this exercise 5-10 times with each hand. Full finger fist Stand or sit with your arm, hand, and all five fingers pointed straight up. Make sure to keep your wrist straight during the exercise. Gently bend your fingers into your palm until the tips of your fingers are touching the middle of your palm. Hold this position for __________ seconds. Extend your fingers back to the starting position, stretching every joint fully. Repeat this  exercise 5-10 times with each hand. Straight fist Stand or sit with your arm, hand, and all five fingers pointed straight up. Make sure to keep your wrist straight during the exercise. Gently bend your fingers at the big knuckle, where your fingers meet your hand, and the middle knuckle. Keep the knuckle at the tips of your fingers straight and try to touch the bottom of your palm. Hold this position for __________ seconds. Extend your fingers back to the starting position, stretching every joint fully. Repeat this exercise 5-10 times with each hand. Tabletop Stand or sit with your arm, hand, and all five fingers pointed straight up. Make sure to keep your wrist straight during the exercise. Gently bend your fingers at the big knuckle, where your fingers meet your hand, as far down as you can while keeping the small knuckles in your fingers straight. Think of forming a tabletop with your fingers. Hold this position for __________ seconds. Extend your fingers back to the starting position, stretching every joint fully. Repeat this exercise 5-10 times with each hand. Finger spread Place your hand flat on a table with your palm facing down. Make sure your wrist stays straight as you do this exercise. Spread your fingers and thumb apart from each other as far as you can until you feel a gentle stretch. Hold this position for __________ seconds. Bring your fingers and thumb tight together again. Hold this position for __________ seconds. Repeat this exercise 5-10 times with each hand. Making circles Stand or sit with your arm, hand, and all five fingers pointed straight up. Make sure to keep your   wrist straight during the exercise. Make a circle by touching the tip of your thumb to the tip of your index finger. Hold for __________ seconds. Then open your hand wide. Repeat this motion with your thumb and each finger on your hand. Repeat this exercise 5-10 times with each hand. Thumb motion Sit  with your forearm resting on a table and your wrist straight. Your thumb should be facing up toward the ceiling. Keep your fingers relaxed as you move your thumb. Lift your thumb up as high as you can toward the ceiling. Hold for __________ seconds. Bend your thumb across your palm as far as you can, reaching the tip of your thumb for the small finger (pinkie) side of your palm. Hold for __________ seconds. Repeat this exercise 5-10 times with each hand. Grip strengthening  Hold a stress ball or other soft ball in the middle of your hand. Slowly increase the pressure, squeezing the ball as much as you can without causing pain. Think of bringing the tips of your fingers into the middle of your palm. All of your finger joints should bend when doing this exercise. Hold your squeeze for __________ seconds, then relax. Repeat this exercise 5-10 times with each hand. Contact a health care provider if: Your hand pain or discomfort gets much worse when you do an exercise. Your hand pain or discomfort does not improve within 2 hours after you exercise. If you have any of these problems, stop doing these exercises right away. Do not do them again unless your health care provider says that you can. Get help right away if: You develop sudden, severe hand pain or swelling. If this happens, stop doing these exercises right away. Do not do them again unless your health care provider says that you can. This information is not intended to replace advice given to you by your health care provider. Make sure you discuss any questions you have with your healthcare provider. Document Revised: 06/07/2018 Document Reviewed: 02/15/2018 Elsevier Patient Education  2022 [employer]  

## 2020-09-02 LAB — CBC WITH DIFFERENTIAL/PLATELET
Absolute Monocytes: 352 cells/uL (ref 200–950)
Basophils Absolute: 48 cells/uL (ref 0–200)
Basophils Relative: 0.7 %
Eosinophils Absolute: 269 cells/uL (ref 15–500)
Eosinophils Relative: 3.9 %
HCT: 35.3 % (ref 35.0–45.0)
Hemoglobin: 11.6 g/dL — ABNORMAL LOW (ref 11.7–15.5)
Lymphs Abs: 2077 cells/uL (ref 850–3900)
MCH: 30.9 pg (ref 27.0–33.0)
MCHC: 32.9 g/dL (ref 32.0–36.0)
MCV: 93.9 fL (ref 80.0–100.0)
MPV: 9.6 fL (ref 7.5–12.5)
Monocytes Relative: 5.1 %
Neutro Abs: 4154 cells/uL (ref 1500–7800)
Neutrophils Relative %: 60.2 %
Platelets: 326 10*3/uL (ref 140–400)
RBC: 3.76 10*6/uL — ABNORMAL LOW (ref 3.80–5.10)
RDW: 13.5 % (ref 11.0–15.0)
Total Lymphocyte: 30.1 %
WBC: 6.9 10*3/uL (ref 3.8–10.8)

## 2020-09-02 LAB — COMPLETE METABOLIC PANEL WITH GFR
AG Ratio: 1.6 (calc) (ref 1.0–2.5)
ALT: 9 U/L (ref 6–29)
AST: 11 U/L (ref 10–35)
Albumin: 4.1 g/dL (ref 3.6–5.1)
Alkaline phosphatase (APISO): 90 U/L (ref 37–153)
BUN: 14 mg/dL (ref 7–25)
CO2: 29 mmol/L (ref 20–32)
Calcium: 10 mg/dL (ref 8.6–10.4)
Chloride: 105 mmol/L (ref 98–110)
Creat: 0.91 mg/dL (ref 0.60–0.93)
GFR, Est African American: 73 mL/min/{1.73_m2} (ref >=60)
GFR, Est Non African American: 63 mL/min/{1.73_m2} (ref >=60)
Globulin: 2.5 g/dL (calc) (ref 1.9–3.7)
Glucose, Bld: 105 mg/dL — ABNORMAL HIGH (ref 65–99)
Potassium: 4.7 mmol/L (ref 3.5–5.3)
Sodium: 142 mmol/L (ref 135–146)
Total Bilirubin: 0.3 mg/dL (ref 0.2–1.2)
Total Protein: 6.6 g/dL (ref 6.1–8.1)

## 2020-09-02 LAB — URIC ACID: Uric Acid, Serum: 3.3 mg/dL (ref 2.5–7.0)

## 2020-09-02 NOTE — Progress Notes (Signed)
Uric acid is WNL.  Glucose is 105. Rest of CMP WNL.  RBC count and hgb are borderline low.  Please notify the patient and forward lab work to PCP.

## 2020-09-18 ENCOUNTER — Other Ambulatory Visit: Payer: Self-pay | Admitting: Physician Assistant

## 2020-09-21 DIAGNOSIS — M5416 Radiculopathy, lumbar region: Secondary | ICD-10-CM | POA: Diagnosis not present

## 2020-10-07 DIAGNOSIS — M8580 Other specified disorders of bone density and structure, unspecified site: Secondary | ICD-10-CM | POA: Diagnosis not present

## 2020-10-07 DIAGNOSIS — E7800 Pure hypercholesterolemia, unspecified: Secondary | ICD-10-CM | POA: Diagnosis not present

## 2020-10-07 DIAGNOSIS — F324 Major depressive disorder, single episode, in partial remission: Secondary | ICD-10-CM | POA: Diagnosis not present

## 2020-10-07 DIAGNOSIS — M542 Cervicalgia: Secondary | ICD-10-CM | POA: Diagnosis not present

## 2020-10-07 DIAGNOSIS — E1169 Type 2 diabetes mellitus with other specified complication: Secondary | ICD-10-CM | POA: Diagnosis not present

## 2020-10-07 DIAGNOSIS — D509 Iron deficiency anemia, unspecified: Secondary | ICD-10-CM | POA: Diagnosis not present

## 2020-10-07 DIAGNOSIS — K219 Gastro-esophageal reflux disease without esophagitis: Secondary | ICD-10-CM | POA: Diagnosis not present

## 2020-10-07 DIAGNOSIS — E2839 Other primary ovarian failure: Secondary | ICD-10-CM | POA: Diagnosis not present

## 2020-10-07 DIAGNOSIS — G44209 Tension-type headache, unspecified, not intractable: Secondary | ICD-10-CM | POA: Diagnosis not present

## 2020-10-07 DIAGNOSIS — M109 Gout, unspecified: Secondary | ICD-10-CM | POA: Diagnosis not present

## 2020-10-07 DIAGNOSIS — I10 Essential (primary) hypertension: Secondary | ICD-10-CM | POA: Diagnosis not present

## 2020-10-07 DIAGNOSIS — K589 Irritable bowel syndrome without diarrhea: Secondary | ICD-10-CM | POA: Diagnosis not present

## 2020-10-13 DIAGNOSIS — H01114 Allergic dermatitis of left upper eyelid: Secondary | ICD-10-CM | POA: Diagnosis not present

## 2020-10-13 DIAGNOSIS — H01115 Allergic dermatitis of left lower eyelid: Secondary | ICD-10-CM | POA: Diagnosis not present

## 2020-10-13 DIAGNOSIS — H01112 Allergic dermatitis of right lower eyelid: Secondary | ICD-10-CM | POA: Diagnosis not present

## 2020-10-13 DIAGNOSIS — H01111 Allergic dermatitis of right upper eyelid: Secondary | ICD-10-CM | POA: Diagnosis not present

## 2020-10-19 DIAGNOSIS — H01114 Allergic dermatitis of left upper eyelid: Secondary | ICD-10-CM | POA: Diagnosis not present

## 2020-10-19 DIAGNOSIS — H2513 Age-related nuclear cataract, bilateral: Secondary | ICD-10-CM | POA: Diagnosis not present

## 2020-10-19 DIAGNOSIS — H01111 Allergic dermatitis of right upper eyelid: Secondary | ICD-10-CM | POA: Diagnosis not present

## 2020-10-19 DIAGNOSIS — H1013 Acute atopic conjunctivitis, bilateral: Secondary | ICD-10-CM | POA: Diagnosis not present

## 2020-10-26 DIAGNOSIS — R21 Rash and other nonspecific skin eruption: Secondary | ICD-10-CM | POA: Diagnosis not present

## 2020-10-27 DIAGNOSIS — M5416 Radiculopathy, lumbar region: Secondary | ICD-10-CM | POA: Diagnosis not present

## 2020-10-29 ENCOUNTER — Other Ambulatory Visit: Payer: Self-pay

## 2020-10-29 ENCOUNTER — Ambulatory Visit (INDEPENDENT_AMBULATORY_CARE_PROVIDER_SITE_OTHER): Payer: PPO | Admitting: Physician Assistant

## 2020-10-29 ENCOUNTER — Encounter: Payer: Self-pay | Admitting: Physician Assistant

## 2020-10-29 DIAGNOSIS — F5105 Insomnia due to other mental disorder: Secondary | ICD-10-CM | POA: Diagnosis not present

## 2020-10-29 DIAGNOSIS — F3181 Bipolar II disorder: Secondary | ICD-10-CM | POA: Diagnosis not present

## 2020-10-29 DIAGNOSIS — F99 Mental disorder, not otherwise specified: Secondary | ICD-10-CM

## 2020-10-29 NOTE — Progress Notes (Signed)
[facility]  Patient ID: Tammy Mullins,  MRN: [id number]  PCP: Maury Dus, MD  Date of Evaluation: 10/29/2020 Time spent:25 minutes  Chief Complaint:  Chief Complaint   Follow-up; Anxiety; Depression    Virtual Visit via Telehealth  I connected with patient by telephone, with their informed consent, and verified patient privacy and that I am speaking with the correct person using two identifiers.  I am private, in my office and the patient is at home.  I discussed the limitations, risks, security and privacy concerns of performing an evaluation and management service by telephone and the availability of in person appointments. I also discussed with the patient that there may be a patient responsible charge related to this service. The patient expressed understanding and agreed to proceed.   I discussed the assessment and treatment plan with the patient. The patient was provided an opportunity to ask questions and all were answered. The patient agreed with the plan and demonstrated an understanding of the instructions.   The patient was advised to call back or seek an in-person evaluation if the symptoms worsen or if the condition fails to improve as anticipated.  I provided 25 minutes of non-face-to-face time during this encounter.   HISTORY/CURRENT STATUS:  HPI For routine med check.   At her visit 2 months ago we added Wellbutrin to help with depression, decreased energy, and motivation.  Patient states she is a lot better.  "It has really helped me a lot."  She now enjoys going and doing things with her family like cookouts or a party for example.  She is not isolating like she was.  She does not cry easily.  Appetite is normal.  Sleeps well.  Does not get anxious unless she feels like her stomach is going to start cramping and she may not be able to make it to the bathroom to have a BM.  Denies suicidal or homicidal thoughts.  Patient denies increased energy with  decreased need for sleep, no increased talkativeness, no racing thoughts, no impulsivity or risky behaviors, no increased spending, no increased libido, no grandiosity, no increased irritability or anger, and no hallucinations.  Denies dizziness, syncope, seizures, numbness, tingling, tremor, tics, unsteady gait, slurred speech, confusion. Denies muscle or joint pain, stiffness, or dystonia.  Individual Medical History/ Review of Systems: Changes? :Yes    having problems with irritable bowel syndrome.  Past medications for mental health diagnoses include: Cymbalta, Seroquel, Gabapentin, Wellbutrin  Allergies: Crestor [rosuvastatin calcium], Asa [aspirin], Topiramate, and Celebrex [celecoxib]  Current Medications:  Current Outpatient Medications:    allopurinol (ZYLOPRIM) 300 MG tablet, TAKE 1 AND 1/2 TABLETS(450 MG) BY MOUTH DAILY, Disp: 45 tablet, Rfl: 2   amLODipine (NORVASC) 10 MG tablet, TAKE 1 TABLET DAILY, Disp: 90 tablet, Rfl: 1   aspirin 81 MG tablet, Take 81 mg by mouth daily., Disp: , Rfl:    buPROPion (WELLBUTRIN XL) 150 MG 24 hr tablet, Take 1 tablet (150 mg total) by mouth every morning., Disp: 30 tablet, Rfl: 1   butalbital-acetaminophen-caffeine (FIORICET) 50-325-40 MG tablet, Take 1 tablet by mouth every 6 (six) hours as needed for migraine., Disp: 10 tablet, Rfl: 3   calcium carbonate (OSCAL) 1500 (600 Ca) MG TABS tablet, Take 600 mg of elemental calcium by mouth daily with breakfast., Disp: , Rfl:    CALCIUM-MAGNESIUM-ZINC PO, Take by mouth daily., Disp: , Rfl:    Cholecalciferol (VITAMIN D) 2000 UNITS CAPS, Take 2,000 Units by mouth daily. , Disp: , Rfl:  dicyclomine (BENTYL) 20 MG tablet, Take 1 tablet (20 mg total) by mouth 3 (three) times daily as needed., Disp: 90 tablet, Rfl: 0   DULoxetine (CYMBALTA) 60 MG capsule, TAKE 2 CAPSULES BY MOUTH DAILY, Disp: 180 capsule, Rfl: 0   famotidine (PEPCID) 40 MG tablet, Take 40 mg by mouth daily., Disp: , Rfl:    ferrous sulfate  325 (65 FE) MG tablet, Take 1 tablet (325 mg total) by mouth 2 (two) times daily with a meal., Disp: , Rfl: 3   fluticasone (FLONASE) 50 MCG/ACT nasal spray, Place 1 spray into both nostrils as needed for allergies or rhinitis., Disp: , Rfl:    gabapentin (NEURONTIN) 300 MG capsule, Take 2 capsules (600 mg total) by mouth 3 (three) times daily., Disp: 180 capsule, Rfl: 5   Garlic [id number] MG CAPS, Take 300 mg by mouth daily., Disp: , Rfl:    hydrochlorothiazide (HYDRODIURIL) 25 MG tablet, Take 25 mg by mouth daily., Disp: , Rfl:    hyoscyamine (LEVSIN) 0.125 MG tablet, Take 0.125 mg by mouth every 4 (four) hours as needed., Disp: , Rfl:    losartan (COZAAR) 100 MG tablet, Take 1 tablet (100 mg total) by mouth daily., Disp: 30 tablet, Rfl: 5   methocarbamol (ROBAXIN) 500 MG tablet, Take 1-2 tablets by mouth in the morning, at noon, in the evening, and at bedtime. As needed, Disp: , Rfl:    metoprolol succinate (TOPROL-XL) 100 MG 24 hr tablet, TAKE 1 TABLET DAILY, Disp: 90 tablet, Rfl: 1   omeprazole (PRILOSEC) 20 MG capsule, Take 20 mg by mouth daily., Disp: , Rfl:    QUEtiapine (SEROQUEL) 400 MG tablet, TAKE 1 TABLET(400 MG) BY MOUTH AT BEDTIME, Disp: 90 tablet, Rfl: 3   zonisamide (ZONEGRAN) 100 MG capsule, Take 1 capsule (100 mg total) by mouth at bedtime., Disp: 90 capsule, Rfl: 3   acetaminophen (TYLENOL) 500 MG tablet, Take 500 mg by mouth as needed. (Patient not taking: Reported on 09/01/2020), Disp: , Rfl:    diclofenac Sodium (VOLTAREN) 1 % GEL, APPLY 2-4 GRAMS TO AFFECTED JOINTS UP TO FOUR TIMES DAILY (Patient not taking: Reported on 10/29/2020), Disp: 400 g, Rfl: 0   rosuvastatin (CRESTOR) 5 MG tablet, Take 5 mg by mouth once a week.  (Patient not taking: No sig reported), Disp: , Rfl:  Medication Side Effects: none  Family Medical/ Social History: Changes?  No  MENTAL HEALTH EXAM:  There were no vitals taken for this visit.There is no height or weight on file to calculate BMI.  General  Appearance:  unable to assess  Eye Contact:   unable to assess  Speech:  Clear and Coherent and Normal Rate  Volume:  Normal  Mood:  Euthymic  Affect:   unable to assess  Thought Process:  Goal Directed and Descriptions of Associations: Intact  Orientation:  Full (Time, Place, and Person)  Thought Content: Logical   Suicidal Thoughts:  No  Homicidal Thoughts:  No  Memory:  WNL  Judgement:  Good  Insight:  Good  Psychomotor Activity:   unable to assess  Concentration:  Concentration: Good  Recall:  Good  Fund of Knowledge: Good  Language: Good  Assets:  Desire for Improvement  ADL's:  Intact  Cognition: WNL  Prognosis:  Good   Labs  09/01/2020 Glu 105  02/26/2020 See Lipid panel  DIAGNOSES:    ICD-10-CM   1. Bipolar 2 disorder (HCC)  F31.81     2. Insomnia due to other mental disorder  F51.05    F99         Receiving Psychotherapy: No    RECOMMENDATIONS:  PDMP was reviewed.  I provided 25 minutes of non-face-to-face time during this encounter, including time spent before and after the visit in records review, medical decision making, and charting.  I am glad to hear how much better she is after adding Wellbutrin.  No changes in meds need to be made. Encouraged her to call her PCP again and discussed the IBS with them. Continue Wellbutrin XL 150 mg, 1 p.o. every morning. Continue Cymbalta 60 mg, 2 p.o. daily. Continue Seroquel 400 mg, 1 p.o. nightly. Continue gabapentin 300 mg, 2 p.o. 3 times daily. Return in 6 months.  Tammy Moat, PA-C

## 2020-11-07 ENCOUNTER — Other Ambulatory Visit: Payer: Self-pay | Admitting: Physician Assistant

## 2020-11-09 DIAGNOSIS — L309 Dermatitis, unspecified: Secondary | ICD-10-CM | POA: Diagnosis not present

## 2020-12-17 DIAGNOSIS — M5416 Radiculopathy, lumbar region: Secondary | ICD-10-CM | POA: Diagnosis not present

## 2020-12-29 DIAGNOSIS — M5416 Radiculopathy, lumbar region: Secondary | ICD-10-CM | POA: Diagnosis not present

## 2021-01-08 ENCOUNTER — Other Ambulatory Visit: Payer: Self-pay | Admitting: Physician Assistant

## 2021-01-21 IMAGING — RF DG LUMBAR SPINE 2-3V
1 series · 2 of 2 positions shown · non-contrast
Comparison: MRI lumbar spine 01/24/2018

CLINICAL DATA: Lumbar fusion.

EXAM:
LUMBAR SPINE - 2-3 VIEW; DG C-ARM 1-60 MIN

[Series 1: run · 2 of 2 slices shown]
[im 1/2]
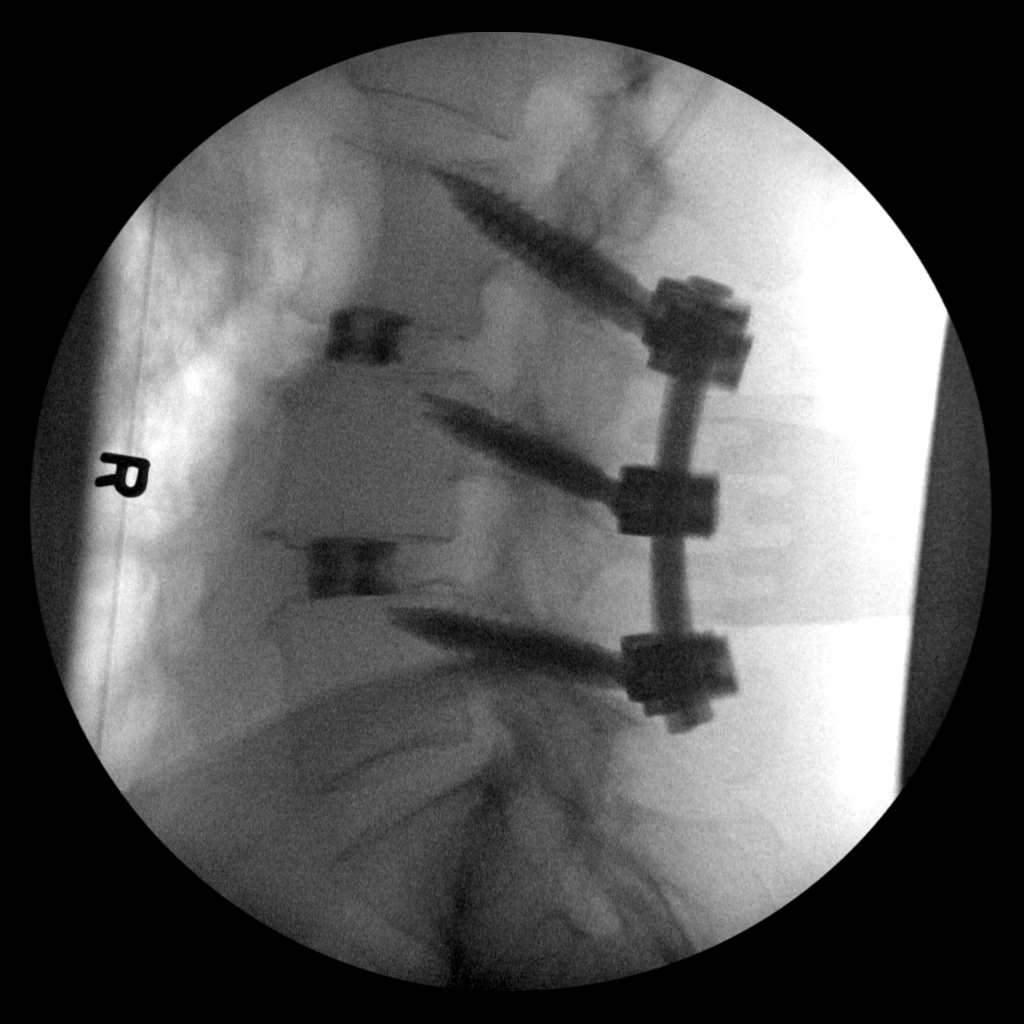
[im 2/2]
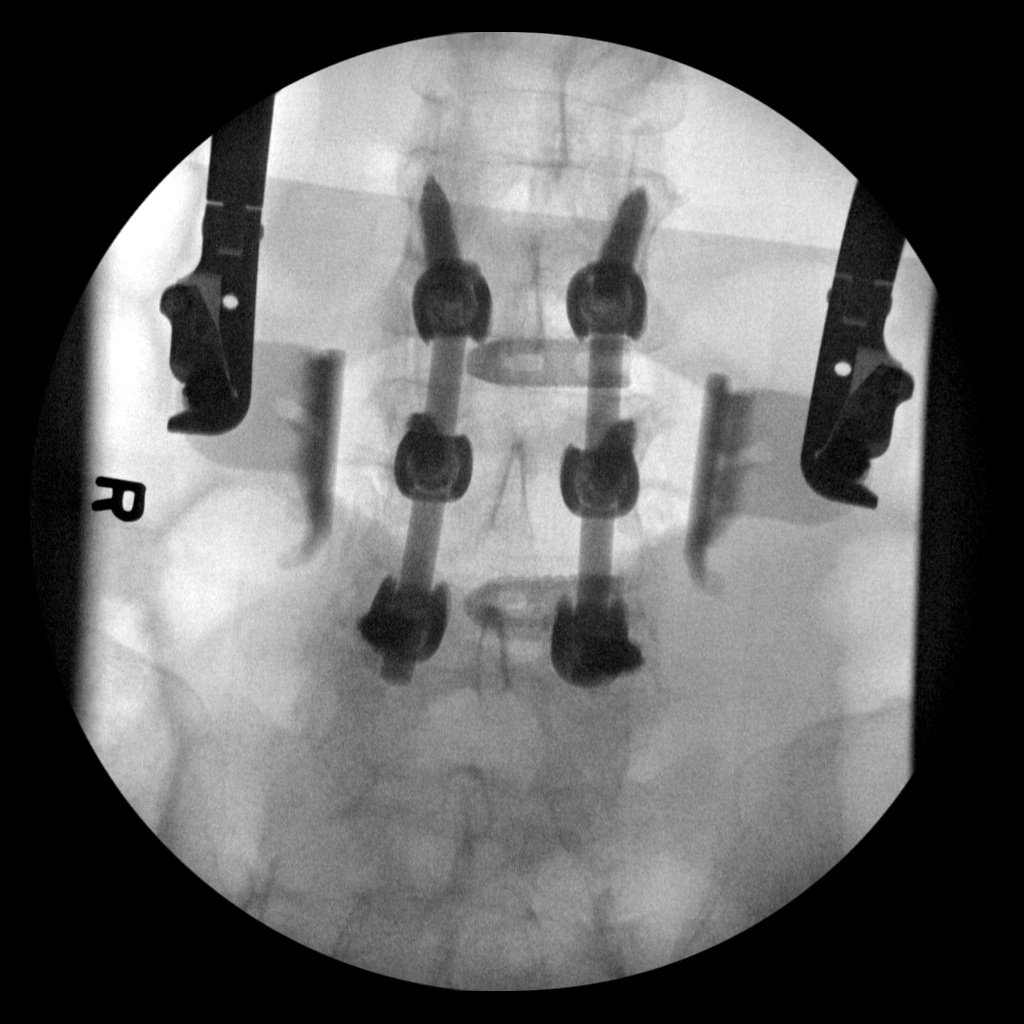

[2 of 2 positions shown; findings below may reference images not displayed]

FINDINGS: Pedicle screws, posterior rods and interbody fusion devices are in
good position at L3-4 and L4-5. No complicating features are
identified.
IMPRESSION: Fusion hardware in good position without complicating features.

## 2021-01-21 IMAGING — RF DG C-ARM 1-60 MIN
1 series · 2 of 2 positions shown · non-contrast
Comparison: MRI lumbar spine 01/24/2018

CLINICAL DATA: Lumbar fusion.

EXAM:
LUMBAR SPINE - 2-3 VIEW; DG C-ARM 1-60 MIN

[Series 1: run · 2 of 2 slices shown]
[im 1/2]
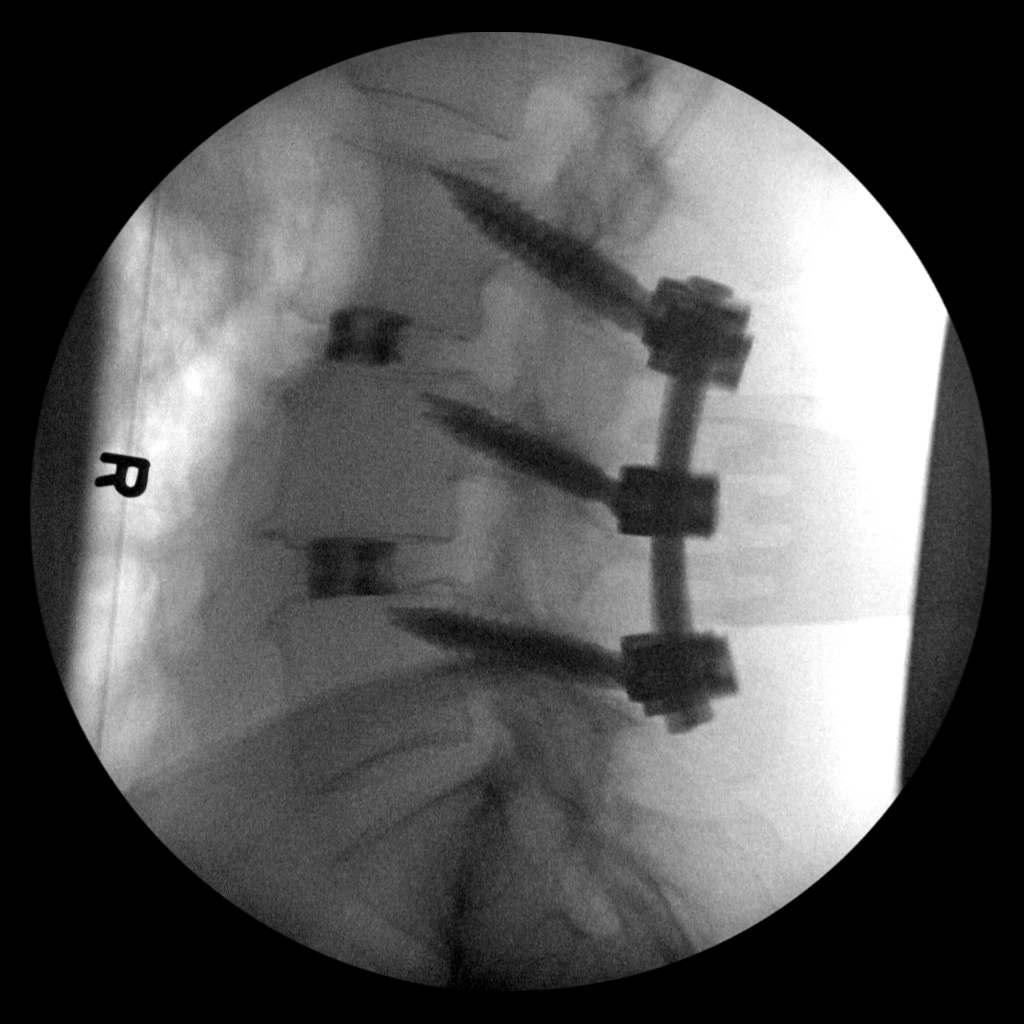
[im 2/2]
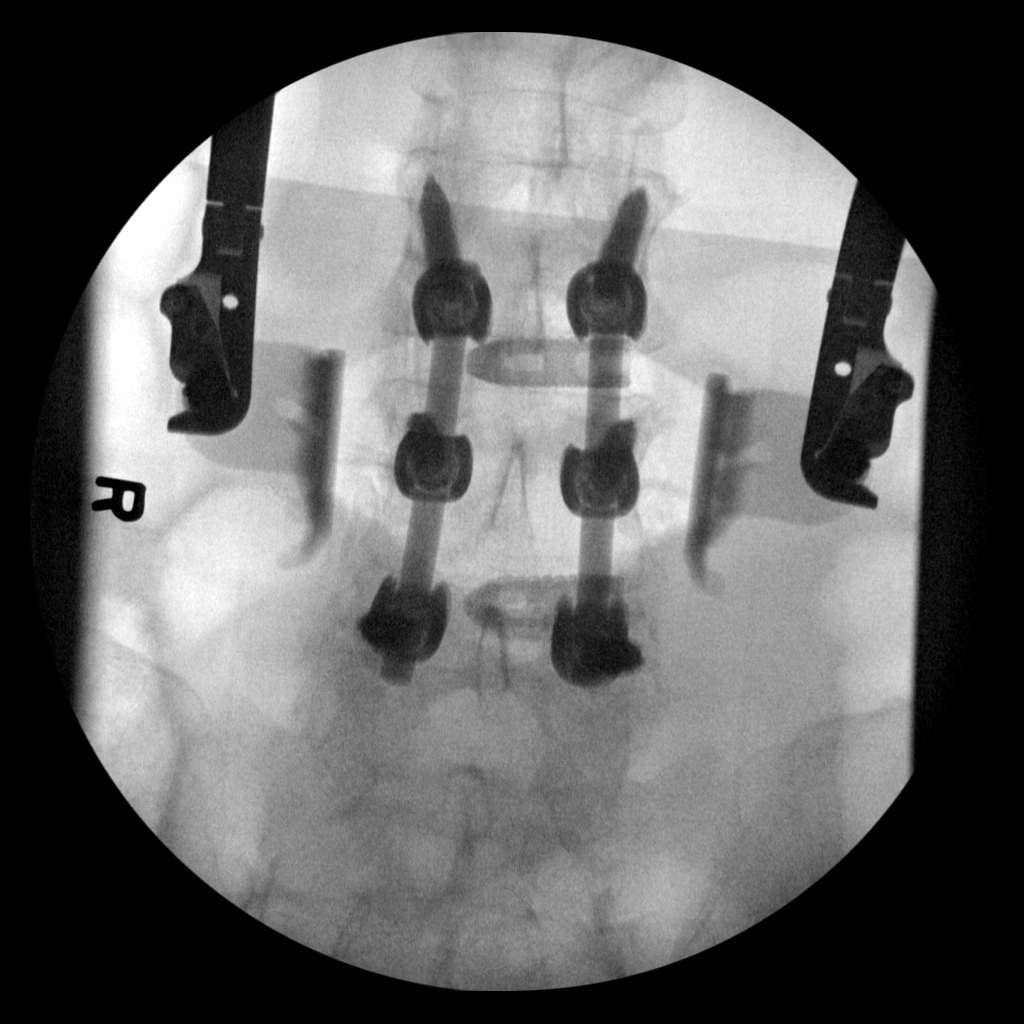

[2 of 2 positions shown; findings below may reference images not displayed]

FINDINGS: Pedicle screws, posterior rods and interbody fusion devices are in
good position at L3-4 and L4-5. No complicating features are
identified.
IMPRESSION: Fusion hardware in good position without complicating features.

## 2021-01-25 DIAGNOSIS — K589 Irritable bowel syndrome without diarrhea: Secondary | ICD-10-CM | POA: Diagnosis not present

## 2021-01-25 DIAGNOSIS — R197 Diarrhea, unspecified: Secondary | ICD-10-CM | POA: Diagnosis not present

## 2021-02-04 DIAGNOSIS — M5441 Lumbago with sciatica, right side: Secondary | ICD-10-CM | POA: Diagnosis not present

## 2021-02-12 ENCOUNTER — Other Ambulatory Visit: Payer: Self-pay | Admitting: Neurological Surgery

## 2021-02-15 ENCOUNTER — Ambulatory Visit: Payer: PPO | Admitting: Neurology

## 2021-02-16 ENCOUNTER — Ambulatory Visit: Payer: PPO | Admitting: Neurology

## 2021-02-16 ENCOUNTER — Encounter: Payer: Self-pay | Admitting: Neurology

## 2021-02-16 ENCOUNTER — Other Ambulatory Visit: Payer: Self-pay

## 2021-02-16 VITALS — BP 153/84 | HR 76 | Ht 65.5 in | Wt 172.5 lb

## 2021-02-16 DIAGNOSIS — G43719 Chronic migraine without aura, intractable, without status migrainosus: Secondary | ICD-10-CM

## 2021-02-16 DIAGNOSIS — Z79899 Other long term (current) drug therapy: Secondary | ICD-10-CM | POA: Diagnosis not present

## 2021-02-16 MED ORDER — ZONISAMIDE 25 MG PO CAPS: 75.0000 mg | ORAL_CAPSULE | Freq: Every day | ORAL | 6 refills | Status: DC

## 2021-02-16 MED ORDER — GABAPENTIN 300 MG PO CAPS: 600.0000 mg | ORAL_CAPSULE | Freq: Three times a day (TID) | ORAL | 3 refills | Status: DC

## 2021-02-16 NOTE — Patient Instructions (Addendum)
I hope your surgery goes well  Continue Zonegran 75 mg at bedtime Keep gabapentin at current dose for now, but we may be able to reduce the dose after your back surgery, stay in touch with me Keep Fioricet as needed for severe headache  See you back in 6 months
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
## 2021-03-02 ENCOUNTER — Ambulatory Visit: Payer: PPO | Admitting: Rheumatology

## 2021-03-02 ENCOUNTER — Other Ambulatory Visit: Payer: Self-pay

## 2021-03-02 ENCOUNTER — Encounter: Payer: Self-pay | Admitting: Rheumatology

## 2021-03-02 VITALS — BP 153/89 | HR 84 | Wt 175.6 lb

## 2021-03-02 DIAGNOSIS — M5030 Other cervical disc degeneration, unspecified cervical region: Secondary | ICD-10-CM | POA: Diagnosis not present

## 2021-03-02 DIAGNOSIS — M5136 Other intervertebral disc degeneration, lumbar region: Secondary | ICD-10-CM

## 2021-03-02 DIAGNOSIS — F5101 Primary insomnia: Secondary | ICD-10-CM

## 2021-03-02 DIAGNOSIS — Z5181 Encounter for therapeutic drug level monitoring: Secondary | ICD-10-CM

## 2021-03-02 DIAGNOSIS — R5383 Other fatigue: Secondary | ICD-10-CM

## 2021-03-02 DIAGNOSIS — M170 Bilateral primary osteoarthritis of knee: Secondary | ICD-10-CM | POA: Diagnosis not present

## 2021-03-02 DIAGNOSIS — F419 Anxiety disorder, unspecified: Secondary | ICD-10-CM

## 2021-03-02 DIAGNOSIS — M8589 Other specified disorders of bone density and structure, multiple sites: Secondary | ICD-10-CM

## 2021-03-02 DIAGNOSIS — M19041 Primary osteoarthritis, right hand: Secondary | ICD-10-CM

## 2021-03-02 DIAGNOSIS — M25511 Pain in right shoulder: Secondary | ICD-10-CM

## 2021-03-02 DIAGNOSIS — F32A Depression, unspecified: Secondary | ICD-10-CM

## 2021-03-02 DIAGNOSIS — G894 Chronic pain syndrome: Secondary | ICD-10-CM | POA: Diagnosis not present

## 2021-03-02 DIAGNOSIS — M19042 Primary osteoarthritis, left hand: Secondary | ICD-10-CM

## 2021-03-02 DIAGNOSIS — G8929 Other chronic pain: Secondary | ICD-10-CM

## 2021-03-02 DIAGNOSIS — M1A09X0 Idiopathic chronic gout, multiple sites, without tophus (tophi): Secondary | ICD-10-CM | POA: Diagnosis not present

## 2021-03-02 DIAGNOSIS — Z8719 Personal history of other diseases of the digestive system: Secondary | ICD-10-CM

## 2021-03-02 DIAGNOSIS — Z8679 Personal history of other diseases of the circulatory system: Secondary | ICD-10-CM

## 2021-03-02 DIAGNOSIS — M797 Fibromyalgia: Secondary | ICD-10-CM

## 2021-03-02 MED ORDER — LIDOCAINE HCL 1 % IJ SOLN
1.0000 mL | INTRAMUSCULAR | Status: AC | PRN
  Administered 2021-03-02: 1 mL

## 2021-03-02 MED ORDER — TRIAMCINOLONE ACETONIDE 40 MG/ML IJ SUSP
40.0000 mg | INTRAMUSCULAR | Status: AC | PRN
  Administered 2021-03-02: 40 mg via INTRA_ARTICULAR

## 2021-03-02 NOTE — Patient Instructions (Addendum)
Please get CBC with differential, CMP with GFR and uric acid with your next labs  Shoulder Exercises Ask your health care provider which exercises are safe for you. Do exercises exactly as told by your health care provider and adjust them as directed. It is normal to feel mild stretching, pulling, tightness, or discomfort as you do these exercises. Stop right away if you feel sudden pain or your pain gets worse. Do not begin these exercises until told by your health care provider. Stretching exercises External rotation and abduction This exercise is sometimes called corner stretch. This exercise rotates your arm outward (external rotation) and moves your arm out from your body (abduction). Stand in a doorway with one of your feet slightly in front of the other. This is called a staggered stance. If you cannot reach your forearms to the door frame, stand facing a corner of a room. Choose one of the following positions as told by your health care provider: Place your hands and forearms on the door frame above your head. Place your hands and forearms on the door frame at the height of your head. Place your hands on the door frame at the height of your elbows. Slowly move your weight onto your front foot until you feel a stretch across your chest and in the front of your shoulders. Keep your head and chest upright and keep your abdominal muscles tight. Hold for __________ seconds. To release the stretch, shift your weight to your back foot. Repeat __________ times. Complete this exercise __________ times a day. Extension, standing Stand and hold a broomstick, a cane, or a similar object behind your back. Your hands should be a little wider than shoulder width apart. Your palms should face away from your back. Keeping your elbows straight and your shoulder muscles relaxed, move the stick away from your body until you feel a stretch in your shoulders (extension). Avoid shrugging your shoulders  while you move the stick. Keep your shoulder blades tucked down toward the middle of your back. Hold for __________ seconds. Slowly return to the starting position. Repeat __________ times. Complete this exercise __________ times a day. Range-of-motion exercises Pendulum  Stand near a wall or a surface that you can hold onto for balance. Bend at the waist and let your left / right arm hang straight down. Use your other arm to support you. Keep your back straight and do not lock your knees. Relax your left / right arm and shoulder muscles, and move your hips and your trunk so your left / right arm swings freely. Your arm should swing because of the motion of your body, not because you are using your arm or shoulder muscles. Keep moving your hips and trunk so your arm swings in the following directions, as told by your health care provider: Side to side. Forward and backward. In clockwise and counterclockwise circles. Continue each motion for __________ seconds, or for as long as told by your health care provider. Slowly return to the starting position. Repeat __________ times. Complete this exercise __________ times a day. Shoulder flexion, standing  Stand and hold a broomstick, a cane, or a similar object. Place your hands a little more than shoulder width apart on the object. Your left / right hand should be palm up, and your other hand should be palm down. Keep your elbow straight and your shoulder muscles relaxed. Push the stick up with your healthy arm to raise your left / right arm in front of  your body, and then over your head until you feel a stretch in your shoulder (flexion). Avoid shrugging your shoulder while you raise your arm. Keep your shoulder blade tucked down toward the middle of your back. Hold for __________ seconds. Slowly return to the starting position. Repeat __________ times. Complete this exercise __________ times a day. Shoulder abduction, standing Stand and hold a  broomstick, a cane, or a similar object. Place your hands a little more than shoulder width apart on the object. Your left / right hand should be palm up, and your other hand should be palm down. Keep your elbow straight and your shoulder muscles relaxed. Push the object across your body toward your left / right side. Raise your left / right arm to the side of your body (abduction) until you feel a stretch in your shoulder. Do not raise your arm above shoulder height unless your health care provider tells you to do that. If directed, raise your arm over your head. Avoid shrugging your shoulder while you raise your arm. Keep your shoulder blade tucked down toward the middle of your back. Hold for __________ seconds. Slowly return to the starting position. Repeat __________ times. Complete this exercise __________ times a day. Internal rotation  Place your left / right hand behind your back, palm up. Use your other hand to dangle an exercise band, a towel, or a similar object over your shoulder. Grasp the band with your left / right hand so you are holding on to both ends. Gently pull up on the band until you feel a stretch in the front of your left / right shoulder. The movement of your arm toward the center of your body is called internal rotation. Avoid shrugging your shoulder while you raise your arm. Keep your shoulder blade tucked down toward the middle of your back. Hold for __________ seconds. Release the stretch by letting go of the band and lowering your hands. Repeat __________ times. Complete this exercise __________ times a day. Strengthening exercises External rotation  Sit in a stable chair without armrests. Secure an exercise band to a stable object at elbow height on your left / right side. Place a soft object, such as a folded towel or a small pillow, between your left / right upper arm and your body to move your elbow about 4 inches (10 cm) away from your side. Hold the end of  the exercise band so it is tight and there is no slack. Keeping your elbow pressed against the soft object, slowly move your forearm out, away from your abdomen (external rotation). Keep your body steady so only your forearm moves. Hold for __________ seconds. Slowly return to the starting position. Repeat __________ times. Complete this exercise __________ times a day. Shoulder abduction  Sit in a stable chair without armrests, or stand up. Hold a __________ weight in your left / right hand, or hold an exercise band with both hands. Start with your arms straight down and your left / right palm facing in, toward your body. Slowly lift your left / right hand out to your side (abduction). Do not lift your hand above shoulder height unless your health care provider tells you that this is safe. Keep your arms straight. Avoid shrugging your shoulder while you do this movement. Keep your shoulder blade tucked down toward the middle of your back. Hold for __________ seconds. Slowly lower your arm, and return to the starting position. Repeat __________ times. Complete this exercise __________ times a  day. Shoulder extension Sit in a stable chair without armrests, or stand up. Secure an exercise band to a stable object in front of you so it is at shoulder height. Hold one end of the exercise band in each hand. Your palms should face each other. Straighten your elbows and lift your hands up to shoulder height. Step back, away from the secured end of the exercise band, until the band is tight and there is no slack. Squeeze your shoulder blades together as you pull your hands down to the sides of your thighs (extension). Stop when your hands are straight down by your sides. Do not let your hands go behind your body. Hold for __________ seconds. Slowly return to the starting position. Repeat __________ times. Complete this exercise __________ times a day. Shoulder row Sit in a stable chair without  armrests, or stand up. Secure an exercise band to a stable object in front of you so it is at waist height. Hold one end of the exercise band in each hand. Position your palms so that your thumbs are facing the ceiling (neutral position). Bend each of your elbows to a 90-degree angle (right angle) and keep your upper arms at your sides. Step back until the band is tight and there is no slack. Slowly pull your elbows back behind you. Hold for __________ seconds. Slowly return to the starting position. Repeat __________ times. Complete this exercise __________ times a day. Shoulder press-ups  Sit in a stable chair that has armrests. Sit upright, with your feet flat on the floor. Put your hands on the armrests so your elbows are bent and your fingers are pointing forward. Your hands should be about even with the sides of your body. Push down on the armrests and use your arms to lift yourself off the chair. Straighten your elbows and lift yourself up as much as you comfortably can. Move your shoulder blades down, and avoid letting your shoulders move up toward your ears. Keep your feet on the ground. As you get stronger, your feet should support less of your body weight as you lift yourself up. Hold for __________ seconds. Slowly lower yourself back into the chair. Repeat __________ times. Complete this exercise __________ times a day. Wall push-ups  Stand so you are facing a stable wall. Your feet should be about one arm-length away from the wall. Lean forward and place your palms on the wall at shoulder height. Keep your feet flat on the floor as you bend your elbows and lean forward toward the wall. Hold for __________ seconds. Straighten your elbows to push yourself back to the starting position. Repeat __________ times. Complete this exercise __________ times a day. This information is not intended to replace advice given to you by your health care provider. Make sure you discuss any  questions you have with your health care provider. Document Revised: 06/08/2018 Document Reviewed: 03/16/2018 [employer] Patient Education  [employer].

## 2021-03-03 DIAGNOSIS — K219 Gastro-esophageal reflux disease without esophagitis: Secondary | ICD-10-CM | POA: Diagnosis not present

## 2021-03-03 DIAGNOSIS — M542 Cervicalgia: Secondary | ICD-10-CM | POA: Diagnosis not present

## 2021-03-03 DIAGNOSIS — Z0000 Encounter for general adult medical examination without abnormal findings: Secondary | ICD-10-CM | POA: Diagnosis not present

## 2021-03-03 DIAGNOSIS — E1169 Type 2 diabetes mellitus with other specified complication: Secondary | ICD-10-CM | POA: Diagnosis not present

## 2021-03-03 DIAGNOSIS — K589 Irritable bowel syndrome without diarrhea: Secondary | ICD-10-CM | POA: Diagnosis not present

## 2021-03-03 DIAGNOSIS — E7800 Pure hypercholesterolemia, unspecified: Secondary | ICD-10-CM | POA: Diagnosis not present

## 2021-03-03 DIAGNOSIS — E2839 Other primary ovarian failure: Secondary | ICD-10-CM | POA: Diagnosis not present

## 2021-03-03 DIAGNOSIS — D509 Iron deficiency anemia, unspecified: Secondary | ICD-10-CM | POA: Diagnosis not present

## 2021-03-03 DIAGNOSIS — M8580 Other specified disorders of bone density and structure, unspecified site: Secondary | ICD-10-CM | POA: Diagnosis not present

## 2021-03-03 DIAGNOSIS — I10 Essential (primary) hypertension: Secondary | ICD-10-CM | POA: Diagnosis not present

## 2021-03-03 DIAGNOSIS — F324 Major depressive disorder, single episode, in partial remission: Secondary | ICD-10-CM | POA: Diagnosis not present

## 2021-03-03 DIAGNOSIS — M109 Gout, unspecified: Secondary | ICD-10-CM | POA: Diagnosis not present

## 2021-03-11 DIAGNOSIS — M5441 Lumbago with sciatica, right side: Secondary | ICD-10-CM | POA: Diagnosis not present

## 2021-03-16 NOTE — Pre-Procedure Instructions (Signed)
Surgical Instructions    Your procedure is scheduled on Friday, January 20th.  Report to [facility] Main Entrance "A" at 05:30 A.M., then check in with the Admitting office.  Call this number if you have problems the morning of surgery:  [phone]   If you have any questions prior to your surgery date call [phone]: Open Monday-Friday 8am-4pm    Remember:  Do not eat or drink after midnight the night before your surgery     Take these medicines the morning of surgery with A SIP OF WATER  allopurinol (ZYLOPRIM) amLODipine (NORVASC)  buPROPion (WELLBUTRIN XL) DULoxetine (CYMBALTA) famotidine (PEPCID) metoprolol succinate (TOPROL-XL)  omeprazole (PRILOSEC)    If needed: fluticasone (FLONASE) gabapentin (NEURONTIN) methocarbamol (ROBAXIN)    Follow your surgeon's instructions on when to stop Aspirin.  If no instructions were given by your surgeon then you will need to call the office to get those instructions.      As of today, STOP taking any Aspirin (unless otherwise instructed by your surgeon) Aleve, Naproxen, Ibuprofen, Motrin, Advil, Goody's, BC's, all herbal medications, fish oil, and all vitamins.                     Do NOT Smoke (Tobacco/Vaping) or drink Alcohol 24 hours prior to your procedure.  If you use a CPAP at night, you may bring all equipment for your overnight stay.   Contacts, glasses, piercing's, hearing aid's, dentures or partials may not be worn into surgery, please bring cases for these belongings.    For patients admitted to the hospital, discharge time will be determined by your treatment team.   Patients discharged the day of surgery will not be allowed to drive home, and someone needs to stay with them for 24 hours.  NO VISITORS WILL BE ALLOWED IN PRE-OP WHERE PATIENTS GET READY FOR SURGERY.  ONLY 1 SUPPORT PERSON MAY BE PRESENT IN THE WAITING ROOM WHILE YOU ARE IN SURGERY.  IF YOU ARE TO BE ADMITTED, ONCE YOU ARE IN YOUR ROOM YOU WILL BE  ALLOWED TWO (2) VISITORS.  Minor children may have two parents present. Special consideration for safety and communication needs will be reviewed on a case by case basis.   Special instructions:   [place]- Preparing For Surgery  Before surgery, you can play an important role. Because skin is not sterile, your skin needs to be as free of germs as possible. You can reduce the number of germs on your skin by washing with CHG (chlorahexidine gluconate) Soap before surgery.  CHG is an antiseptic cleaner which kills germs and bonds with the skin to continue killing germs even after washing.    Oral Hygiene is also important to reduce your risk of infection.  Remember - BRUSH YOUR TEETH THE MORNING OF SURGERY WITH YOUR REGULAR TOOTHPASTE  Please do not use if you have an allergy to CHG or antibacterial soaps. If your skin becomes reddened/irritated stop using the CHG.  Do not shave (including legs and underarms) for at least 48 hours prior to first CHG shower. It is OK to shave your face.  Please follow these instructions carefully.   Shower the NIGHT BEFORE SURGERY and the MORNING OF SURGERY  If you chose to wash your hair, wash your hair first as usual with your normal shampoo.  After you shampoo, rinse your hair and body thoroughly to remove the shampoo.  Use CHG Soap as you would any other liquid soap. You can apply CHG  directly to the skin and wash gently with a scrungie or a clean washcloth.   Apply the CHG Soap to your body ONLY FROM THE NECK DOWN.  Do not use on open wounds or open sores. Avoid contact with your eyes, ears, mouth and genitals (private parts). Wash Face and genitals (private parts)  with your normal soap.   Wash thoroughly, paying special attention to the area where your surgery will be performed.  Thoroughly rinse your body with warm water from the neck down.  DO NOT shower/wash with your normal soap after using and rinsing off the CHG Soap.  Pat yourself dry with  a CLEAN TOWEL.  Wear CLEAN PAJAMAS to bed the night before surgery  Place CLEAN SHEETS on your bed the night before your surgery  DO NOT SLEEP WITH PETS.   Day of Surgery: Shower with CHG soap. Do not wear jewelry, make up, nail polish, gel polish, artificial nails, or any other type of covering on natural nails including finger and toenails. If patients have artificial nails, gel coating, etc. that need to be removed by a nail salon please have this removed prior to surgery. Surgery may need to be canceled/delayed if the surgeon/ anesthesia feels like the patient is unable to be adequately monitored. Do not wear lotions, powders, perfumes, or deodorant. Do not shave 48 hours prior to surgery.   Do not bring valuables to the hospital. [facility] is not responsible for any belongings or valuables. Wear Clean/Comfortable clothing the morning of surgery Remember to brush your teeth WITH YOUR REGULAR TOOTHPASTE.   Please read over the following fact sheets that you were given.   3 days prior to your procedure or After your COVID test   You are not required to quarantine however you are required to wear a well-fitting mask when you are out and around people not in your household. If your mask becomes wet or soiled, replace with a new one.   Wash your hands often with soap and water for 20 seconds or clean your hands with an alcohol-based hand sanitizer that contains at least 60% alcohol.   Do not share personal items.   Notify your provider:  o if you are in close contact with someone who has COVID  o or if you develop a fever of 100.4 or greater, sneezing, cough, sore throat, shortness of breath or body aches.

## 2021-03-17 ENCOUNTER — Encounter (HOSPITAL_COMMUNITY): Payer: Self-pay

## 2021-03-17 ENCOUNTER — Other Ambulatory Visit: Payer: Self-pay

## 2021-03-17 ENCOUNTER — Encounter (HOSPITAL_COMMUNITY)
Admission: RE | Admit: 2021-03-17 | Discharge: 2021-03-17 | Disposition: A | Discharge status: Home / Self Care | Payer: PPO | Source: Ambulatory Visit | Attending: Neurological Surgery | Admitting: Neurological Surgery

## 2021-03-17 VITALS — BP 149/84 | HR 79 | Temp 99.5°F | Resp 18 | Ht 65.0 in | Wt 172.4 lb

## 2021-03-17 DIAGNOSIS — Z01818 Encounter for other preprocedural examination: Secondary | ICD-10-CM | POA: Insufficient documentation

## 2021-03-17 DIAGNOSIS — E119 Type 2 diabetes mellitus without complications: Secondary | ICD-10-CM | POA: Insufficient documentation

## 2021-03-17 DIAGNOSIS — Z20822 Contact with and (suspected) exposure to covid-19: Secondary | ICD-10-CM | POA: Diagnosis not present

## 2021-03-17 DIAGNOSIS — Z8679 Personal history of other diseases of the circulatory system: Secondary | ICD-10-CM

## 2021-03-17 LAB — CBC
HCT: 36.7 % (ref 36.0–46.0)
Hemoglobin: 11.3 g/dL — ABNORMAL LOW (ref 12.0–15.0)
MCH: 30 pg (ref 26.0–34.0)
MCHC: 30.8 g/dL (ref 30.0–36.0)
MCV: 97.3 fL (ref 80.0–100.0)
Platelets: 289 10*3/uL (ref 150–400)
RBC: 3.77 MIL/uL — ABNORMAL LOW (ref 3.87–5.11)
RDW: 14.2 % (ref 11.5–15.5)
WBC: 8.6 10*3/uL (ref 4.0–10.5)
nRBC: 0 % (ref 0.0–0.2)

## 2021-03-17 LAB — BASIC METABOLIC PANEL
Anion gap: 9 (ref 5–15)
BUN: 12 mg/dL (ref 8–23)
CO2: 25 mmol/L (ref 22–32)
Calcium: 9.4 mg/dL (ref 8.9–10.3)
Chloride: 108 mmol/L (ref 98–111)
Creatinine, Ser: 0.7 mg/dL (ref 0.44–1.00)
GFR, Estimated: >60 mL/min (ref >60)
Glucose, Bld: 117 mg/dL — ABNORMAL HIGH (ref 70–99)
Potassium: 3.8 mmol/L (ref 3.5–5.1)
Sodium: 142 mmol/L (ref 135–145)

## 2021-03-17 LAB — TYPE AND SCREEN
ABO/RH(D): A POS
Antibody Screen: NEGATIVE

## 2021-03-17 LAB — HEMOGLOBIN A1C
Hgb A1c MFr Bld: 6.6 % — ABNORMAL HIGH (ref 4.8–5.6)
Mean Plasma Glucose: 142.72 mg/dL

## 2021-03-17 LAB — SURGICAL PCR SCREEN

## 2021-03-17 LAB — PROTIME-INR
INR: 1 (ref 0.8–1.2)
Prothrombin Time: 13.1 seconds (ref 11.4–15.2)

## 2021-03-17 LAB — SARS CORONAVIRUS 2 (TAT 6-24 HRS): SARS Coronavirus 2: NEGATIVE

## 2021-03-17 NOTE — Pre-Procedure Instructions (Signed)
Surgical Instructions    Your procedure is scheduled on Friday, January 20th.  Report to [facility] Main Entrance "A" at 05:30 A.M., then check in with the Admitting office.  Call this number if you have problems the morning of surgery:  [phone]   If you have any questions prior to your surgery date call [phone]: Open Monday-Friday 8am-4pm    Remember:  Do not eat or drink after midnight the night before your surgery     Take these medicines the morning of surgery with A SIP OF WATER  allopurinol (ZYLOPRIM) amLODipine (NORVASC)  buPROPion (WELLBUTRIN XL) DULoxetine (CYMBALTA) famotidine (PEPCID) metoprolol succinate (TOPROL-XL)  omeprazole (PRILOSEC)    If needed: fluticasone (FLONASE) gabapentin (NEURONTIN) methocarbamol (ROBAXIN)      As of today, STOP taking any Aspirin (unless otherwise instructed by your surgeon) Aleve, Naproxen, Ibuprofen, Motrin, Advil, Goody's, BC's, all herbal medications, fish oil, and all vitamins.                     Do NOT Smoke (Tobacco/Vaping) or drink Alcohol 24 hours prior to your procedure.  If you use a CPAP at night, you may bring all equipment for your overnight stay.   Contacts, glasses, piercing's, hearing aid's, dentures or partials may not be worn into surgery, please bring cases for these belongings.    For patients admitted to the hospital, discharge time will be determined by your treatment team.   Patients discharged the day of surgery will not be allowed to drive home, and someone needs to stay with them for 24 hours.  NO VISITORS WILL BE ALLOWED IN PRE-OP WHERE PATIENTS GET READY FOR SURGERY.  ONLY 1 SUPPORT PERSON MAY BE PRESENT IN THE WAITING ROOM WHILE YOU ARE IN SURGERY.  IF YOU ARE TO BE ADMITTED, ONCE YOU ARE IN YOUR ROOM YOU WILL BE ALLOWED TWO (2) VISITORS.  Minor children may have two parents present. Special consideration for safety and communication needs will be reviewed on a case by case  basis.   Special instructions:   [place]- Preparing For Surgery  Before surgery, you can play an important role. Because skin is not sterile, your skin needs to be as free of germs as possible. You can reduce the number of germs on your skin by washing with CHG (chlorahexidine gluconate) Soap before surgery.  CHG is an antiseptic cleaner which kills germs and bonds with the skin to continue killing germs even after washing.    Oral Hygiene is also important to reduce your risk of infection.  Remember - BRUSH YOUR TEETH THE MORNING OF SURGERY WITH YOUR REGULAR TOOTHPASTE  Please do not use if you have an allergy to CHG or antibacterial soaps. If your skin becomes reddened/irritated stop using the CHG.  Do not shave (including legs and underarms) for at least 48 hours prior to first CHG shower. It is OK to shave your face.  Please follow these instructions carefully.   Shower the NIGHT BEFORE SURGERY and the MORNING OF SURGERY  If you chose to wash your hair, wash your hair first as usual with your normal shampoo.  After you shampoo, rinse your hair and body thoroughly to remove the shampoo.  Use CHG Soap as you would any other liquid soap. You can apply CHG directly to the skin and wash gently with a scrungie or a clean washcloth.   Apply the CHG Soap to your body ONLY FROM THE NECK DOWN.  Do not use on  open wounds or open sores. Avoid contact with your eyes, ears, mouth and genitals (private parts). Wash Face and genitals (private parts)  with your normal soap.   Wash thoroughly, paying special attention to the area where your surgery will be performed.  Thoroughly rinse your body with warm water from the neck down.  DO NOT shower/wash with your normal soap after using and rinsing off the CHG Soap.  Pat yourself dry with a CLEAN TOWEL.  Wear CLEAN PAJAMAS to bed the night before surgery  Place CLEAN SHEETS on your bed the night before your surgery  DO NOT SLEEP WITH  PETS.   Day of Surgery: Shower with CHG soap. Do not wear jewelry, make up, nail polish, gel polish, artificial nails, or any other type of covering on natural nails including finger and toenails. If patients have artificial nails, gel coating, etc. that need to be removed by a nail salon please have this removed prior to surgery. Surgery may need to be canceled/delayed if the surgeon/ anesthesia feels like the patient is unable to be adequately monitored. Do not wear lotions, powders, perfumes, or deodorant. Do not shave 48 hours prior to surgery.   Do not bring valuables to the hospital. [facility] is not responsible for any belongings or valuables. Wear Clean/Comfortable clothing the morning of surgery Remember to brush your teeth WITH YOUR REGULAR TOOTHPASTE.   Please read over the following fact sheets that you were given.   3 days prior to your procedure or After your COVID test   You are not required to quarantine however you are required to wear a well-fitting mask when you are out and around people not in your household. If your mask becomes wet or soiled, replace with a new one.   Wash your hands often with soap and water for 20 seconds or clean your hands with an alcohol-based hand sanitizer that contains at least 60% alcohol.   Do not share personal items.   Notify your provider:  o if you are in close contact with someone who has COVID  o or if you develop a fever of 100.4 or greater, sneezing, cough, sore throat, shortness of breath or body aches.

## 2021-03-17 NOTE — Progress Notes (Signed)
Email notification from micro lab of invalid sample for PCR, will need to recollect DOS

## 2021-03-17 NOTE — Progress Notes (Signed)
PCP - Maury Dus Cardiologist - denies  PPM/ICD - denies   Chest x-ray - n/a EKG - 03/17/21 Stress Test - denies ECHO - denies Cardiac Cath -denies   Sleep Study - denies  Checks CBGs 3x a week. CBGs in the 100s  As of today, STOP taking any Aspirin (unless otherwise instructed by your surgeon) Aleve, Naproxen, Ibuprofen, Motrin, Advil, Goody's, BC's, all herbal medications, fish oil, and all vitamins.  ERAS Protcol -no   COVID TEST- 03/17/21 in PAT   Anesthesia review: no  Patient denies shortness of breath, fever, cough and chest pain at PAT appointment   All instructions explained to the patient, with a verbal understanding of the material. Patient agrees to go over the instructions while at home for a better understanding. Patient also instructed to self quarantine after being tested for COVID-19. The opportunity to ask questions was provided.

## 2021-03-19 ENCOUNTER — Encounter (HOSPITAL_COMMUNITY): Payer: Self-pay | Admitting: Neurological Surgery

## 2021-03-19 ENCOUNTER — Other Ambulatory Visit: Payer: Self-pay

## 2021-03-19 ENCOUNTER — Ambulatory Visit (HOSPITAL_COMMUNITY): Payer: PPO | Admitting: Anesthesiology

## 2021-03-19 ENCOUNTER — Ambulatory Visit (HOSPITAL_COMMUNITY)
Admission: RE | Admit: 2021-03-19 | Discharge: 2021-03-20 | Disposition: A | Discharge status: Home / Self Care | Payer: PPO | Attending: Neurological Surgery | Admitting: Neurological Surgery

## 2021-03-19 ENCOUNTER — Other Ambulatory Visit (HOSPITAL_COMMUNITY): Payer: Self-pay | Admitting: Neurological Surgery

## 2021-03-19 ENCOUNTER — Ambulatory Visit (HOSPITAL_COMMUNITY): Payer: PPO

## 2021-03-19 ENCOUNTER — Encounter (HOSPITAL_COMMUNITY)
Admission: RE | Disposition: A | Discharge status: Home / Self Care | Payer: Self-pay | Source: Home / Self Care | Attending: Neurological Surgery

## 2021-03-19 DIAGNOSIS — M109 Gout, unspecified: Secondary | ICD-10-CM | POA: Diagnosis not present

## 2021-03-19 DIAGNOSIS — M5416 Radiculopathy, lumbar region: Secondary | ICD-10-CM | POA: Diagnosis not present

## 2021-03-19 DIAGNOSIS — M1990 Unspecified osteoarthritis, unspecified site: Secondary | ICD-10-CM | POA: Diagnosis not present

## 2021-03-19 DIAGNOSIS — M797 Fibromyalgia: Secondary | ICD-10-CM | POA: Diagnosis not present

## 2021-03-19 DIAGNOSIS — M48061 Spinal stenosis, lumbar region without neurogenic claudication: Secondary | ICD-10-CM | POA: Diagnosis not present

## 2021-03-19 DIAGNOSIS — K219 Gastro-esophageal reflux disease without esophagitis: Secondary | ICD-10-CM | POA: Insufficient documentation

## 2021-03-19 DIAGNOSIS — G4730 Sleep apnea, unspecified: Secondary | ICD-10-CM | POA: Diagnosis not present

## 2021-03-19 DIAGNOSIS — F419 Anxiety disorder, unspecified: Secondary | ICD-10-CM | POA: Insufficient documentation

## 2021-03-19 DIAGNOSIS — Z419 Encounter for procedure for purposes other than remedying health state, unspecified: Secondary | ICD-10-CM

## 2021-03-19 DIAGNOSIS — Z981 Arthrodesis status: Secondary | ICD-10-CM | POA: Diagnosis not present

## 2021-03-19 DIAGNOSIS — F319 Bipolar disorder, unspecified: Secondary | ICD-10-CM | POA: Diagnosis not present

## 2021-03-19 DIAGNOSIS — I10 Essential (primary) hypertension: Secondary | ICD-10-CM | POA: Insufficient documentation

## 2021-03-19 DIAGNOSIS — M9983 Other biomechanical lesions of lumbar region: Secondary | ICD-10-CM | POA: Diagnosis not present

## 2021-03-19 DIAGNOSIS — Z79899 Other long term (current) drug therapy: Secondary | ICD-10-CM | POA: Insufficient documentation

## 2021-03-19 DIAGNOSIS — M7138 Other bursal cyst, other site: Secondary | ICD-10-CM | POA: Diagnosis not present

## 2021-03-19 DIAGNOSIS — M4807 Spinal stenosis, lumbosacral region: Secondary | ICD-10-CM | POA: Diagnosis not present

## 2021-03-19 DIAGNOSIS — M4726 Other spondylosis with radiculopathy, lumbar region: Secondary | ICD-10-CM | POA: Diagnosis not present

## 2021-03-19 DIAGNOSIS — E119 Type 2 diabetes mellitus without complications: Secondary | ICD-10-CM | POA: Insufficient documentation

## 2021-03-19 DIAGNOSIS — G43909 Migraine, unspecified, not intractable, without status migrainosus: Secondary | ICD-10-CM | POA: Insufficient documentation

## 2021-03-19 DIAGNOSIS — Z9889 Other specified postprocedural states: Secondary | ICD-10-CM | POA: Diagnosis not present

## 2021-03-19 HISTORY — PX: BACK SURGERY: SHX140

## 2021-03-19 LAB — GLUCOSE, CAPILLARY
Glucose-Capillary: 109 mg/dL — ABNORMAL HIGH (ref 70–99)
Glucose-Capillary: 116 mg/dL — ABNORMAL HIGH (ref 70–99)
Glucose-Capillary: 147 mg/dL — ABNORMAL HIGH (ref 70–99)

## 2021-03-19 LAB — SURGICAL PCR SCREEN
MRSA, PCR: NEGATIVE
Staphylococcus aureus: NEGATIVE

## 2021-03-19 SURGERY — POSTERIOR LUMBAR FUSION 1 LEVEL
Anesthesia: General | Site: Back

## 2021-03-19 MED ORDER — MENTHOL 3 MG MT LOZG: 1.0000 | LOZENGE | OROMUCOSAL | Status: DC | PRN

## 2021-03-19 MED ORDER — THROMBIN 5000 UNITS EX SOLR: CUTANEOUS | Status: DC | PRN

## 2021-03-19 MED ORDER — THROMBIN 5000 UNITS EX SOLR
CUTANEOUS | Status: AC
  Filled 2021-03-19: qty 5000

## 2021-03-19 MED ORDER — ASPIRIN EC 81 MG PO TBEC
81.0000 mg | DELAYED_RELEASE_TABLET | Freq: Every day | ORAL | Status: DC
  Administered 2021-03-20: 81 mg via ORAL
  Filled 2021-03-19: qty 1

## 2021-03-19 MED ORDER — DEXAMETHASONE 4 MG PO TABS
4.0000 mg | ORAL_TABLET | Freq: Four times a day (QID) | ORAL | Status: DC
  Administered 2021-03-19: 4 mg via ORAL
  Filled 2021-03-19: qty 1

## 2021-03-19 MED ORDER — HYOSCYAMINE SULFATE 0.125 MG PO TABS: 0.1250 mg | ORAL_TABLET | ORAL | Status: DC | PRN

## 2021-03-19 MED ORDER — SUGAMMADEX SODIUM 200 MG/2ML IV SOLN
INTRAVENOUS | Status: DC | PRN
  Administered 2021-03-19: 200 mg via INTRAVENOUS

## 2021-03-19 MED ORDER — CHLORHEXIDINE GLUCONATE CLOTH 2 % EX PADS: 6.0000 | MEDICATED_PAD | Freq: Once | CUTANEOUS | Status: DC

## 2021-03-19 MED ORDER — GABAPENTIN 300 MG PO CAPS: 600.0000 mg | ORAL_CAPSULE | Freq: Three times a day (TID) | ORAL | Status: DC | PRN

## 2021-03-19 MED ORDER — ACETAMINOPHEN 500 MG PO TABS: 1000.0000 mg | ORAL_TABLET | ORAL | Status: AC

## 2021-03-19 MED ORDER — METHOCARBAMOL 750 MG PO TABS
750.0000 mg | ORAL_TABLET | Freq: Four times a day (QID) | ORAL | Status: DC | PRN
  Administered 2021-03-19 – 2021-03-20 (×3): 750 mg via ORAL
  Filled 2021-03-19 (×3): qty 1

## 2021-03-19 MED ORDER — CHLORHEXIDINE GLUCONATE 0.12 % MT SOLN
OROMUCOSAL | Status: AC
  Administered 2021-03-19: 15 mL via OROMUCOSAL
  Filled 2021-03-19: qty 15

## 2021-03-19 MED ORDER — CEFAZOLIN SODIUM-DEXTROSE 2-4 GM/100ML-% IV SOLN
INTRAVENOUS | Status: AC
  Filled 2021-03-19: qty 100

## 2021-03-19 MED ORDER — CEFAZOLIN SODIUM-DEXTROSE 2-4 GM/100ML-% IV SOLN
2.0000 g | INTRAVENOUS | Status: AC
  Administered 2021-03-19: 2 g via INTRAVENOUS

## 2021-03-19 MED ORDER — SODIUM CHLORIDE 0.9 % IV SOLN: 250.0000 mL | INTRAVENOUS | Status: DC

## 2021-03-19 MED ORDER — ALLOPURINOL 100 MG PO TABS
100.0000 mg | ORAL_TABLET | Freq: Every day | ORAL | Status: DC
  Administered 2021-03-20: 100 mg via ORAL
  Filled 2021-03-19: qty 1

## 2021-03-19 MED ORDER — SENNA 8.6 MG PO TABS
1.0000 | ORAL_TABLET | Freq: Two times a day (BID) | ORAL | Status: DC
  Administered 2021-03-19 – 2021-03-20 (×2): 8.6 mg via ORAL
  Filled 2021-03-19 (×2): qty 1

## 2021-03-19 MED ORDER — ONDANSETRON HCL 4 MG/2ML IJ SOLN
INTRAMUSCULAR | Status: DC | PRN
  Administered 2021-03-19: 4 mg via INTRAVENOUS

## 2021-03-19 MED ORDER — 0.9 % SODIUM CHLORIDE (POUR BTL) OPTIME
TOPICAL | Status: DC | PRN
  Administered 2021-03-19: 1000 mL

## 2021-03-19 MED ORDER — ACETAMINOPHEN 500 MG PO TABS
1000.0000 mg | ORAL_TABLET | Freq: Four times a day (QID) | ORAL | Status: DC
  Administered 2021-03-19 – 2021-03-20 (×3): 1000 mg via ORAL
  Filled 2021-03-19 (×3): qty 2

## 2021-03-19 MED ORDER — PHENOL 1.4 % MT LIQD: 1.0000 | OROMUCOSAL | Status: DC | PRN

## 2021-03-19 MED ORDER — METOPROLOL SUCCINATE ER 25 MG PO TB24
12.5000 mg | ORAL_TABLET | Freq: Every day | ORAL | Status: DC
  Administered 2021-03-20: 12.5 mg via ORAL
  Filled 2021-03-19: qty 1

## 2021-03-19 MED ORDER — ZONISAMIDE 25 MG PO CAPS
75.0000 mg | ORAL_CAPSULE | Freq: Every day | ORAL | Status: DC
  Administered 2021-03-19: 75 mg via ORAL
  Filled 2021-03-19: qty 3

## 2021-03-19 MED ORDER — BUPIVACAINE HCL (PF) 0.25 % IJ SOLN
INTRAMUSCULAR | Status: AC
  Filled 2021-03-19: qty 30

## 2021-03-19 MED ORDER — GABAPENTIN 300 MG PO CAPS: 300.0000 mg | ORAL_CAPSULE | ORAL | Status: DC

## 2021-03-19 MED ORDER — LOSARTAN POTASSIUM 50 MG PO TABS
100.0000 mg | ORAL_TABLET | Freq: Every day | ORAL | Status: DC
  Administered 2021-03-20: 100 mg via ORAL
  Filled 2021-03-19: qty 2

## 2021-03-19 MED ORDER — OXYCODONE HCL 5 MG/5ML PO SOLN: 5.0000 mg | Freq: Once | ORAL | Status: AC | PRN

## 2021-03-19 MED ORDER — FENTANYL CITRATE (PF) 250 MCG/5ML IJ SOLN
INTRAMUSCULAR | Status: DC | PRN
  Administered 2021-03-19: 50 ug via INTRAVENOUS
  Administered 2021-03-19 (×6): 25 ug via INTRAVENOUS
  Administered 2021-03-19: 50 ug via INTRAVENOUS

## 2021-03-19 MED ORDER — GABAPENTIN 300 MG PO CAPS
ORAL_CAPSULE | ORAL | Status: AC
  Filled 2021-03-19: qty 1

## 2021-03-19 MED ORDER — ACETAMINOPHEN 10 MG/ML IV SOLN
INTRAVENOUS | Status: AC
  Filled 2021-03-19: qty 100

## 2021-03-19 MED ORDER — KETAMINE HCL 50 MG/5ML IJ SOSY
PREFILLED_SYRINGE | INTRAMUSCULAR | Status: AC
  Filled 2021-03-19: qty 5

## 2021-03-19 MED ORDER — METHOCARBAMOL 500 MG PO TABS
ORAL_TABLET | ORAL | Status: AC
  Administered 2021-03-19: 500 mg via ORAL
  Filled 2021-03-19: qty 1

## 2021-03-19 MED ORDER — LACTATED RINGERS IV SOLN: INTRAVENOUS | Status: DC

## 2021-03-19 MED ORDER — MIDAZOLAM HCL 2 MG/2ML IJ SOLN
INTRAMUSCULAR | Status: DC | PRN
Start: 2021-03-19 — End: 2021-03-19
  Administered 2021-03-19: 1 mg via INTRAVENOUS

## 2021-03-19 MED ORDER — ROCURONIUM BROMIDE 10 MG/ML (PF) SYRINGE
PREFILLED_SYRINGE | INTRAVENOUS | Status: DC | PRN
Start: 2021-03-19 — End: 2021-03-19
  Administered 2021-03-19: 70 mg via INTRAVENOUS
  Administered 2021-03-19: 30 mg via INTRAVENOUS

## 2021-03-19 MED ORDER — ONDANSETRON HCL 4 MG/2ML IJ SOLN: 4.0000 mg | Freq: Four times a day (QID) | INTRAMUSCULAR | Status: DC | PRN

## 2021-03-19 MED ORDER — ONDANSETRON HCL 4 MG PO TABS: 4.0000 mg | ORAL_TABLET | Freq: Four times a day (QID) | ORAL | Status: DC | PRN

## 2021-03-19 MED ORDER — POTASSIUM CHLORIDE IN NACL 20-0.9 MEQ/L-% IV SOLN: INTRAVENOUS | Status: DC

## 2021-03-19 MED ORDER — FENTANYL CITRATE (PF) 100 MCG/2ML IJ SOLN
INTRAMUSCULAR | Status: AC
  Administered 2021-03-19: 25 ug via INTRAVENOUS
  Filled 2021-03-19: qty 2

## 2021-03-19 MED ORDER — PROPOFOL 10 MG/ML IV BOLUS
INTRAVENOUS | Status: DC | PRN
  Administered 2021-03-19: 160 mg via INTRAVENOUS

## 2021-03-19 MED ORDER — PANTOPRAZOLE SODIUM 40 MG PO TBEC
40.0000 mg | DELAYED_RELEASE_TABLET | Freq: Every day | ORAL | Status: DC
  Administered 2021-03-20: 40 mg via ORAL
  Filled 2021-03-19: qty 1

## 2021-03-19 MED ORDER — LIDOCAINE 2% (20 MG/ML) 5 ML SYRINGE
INTRAMUSCULAR | Status: DC | PRN
Start: 2021-03-19 — End: 2021-03-19
  Administered 2021-03-19: 60 mg via INTRAVENOUS

## 2021-03-19 MED ORDER — MORPHINE SULFATE (PF) 2 MG/ML IV SOLN
2.0000 mg | INTRAVENOUS | Status: DC | PRN
  Administered 2021-03-19: 2 mg via INTRAVENOUS
  Filled 2021-03-19: qty 1

## 2021-03-19 MED ORDER — FENTANYL CITRATE (PF) 250 MCG/5ML IJ SOLN
INTRAMUSCULAR | Status: AC
  Filled 2021-03-19: qty 5

## 2021-03-19 MED ORDER — ORAL CARE MOUTH RINSE: 15.0000 mL | Freq: Once | OROMUCOSAL | Status: AC

## 2021-03-19 MED ORDER — OXYCODONE HCL 5 MG PO TABS
5.0000 mg | ORAL_TABLET | Freq: Once | ORAL | Status: AC | PRN
  Administered 2021-03-19: 5 mg via ORAL

## 2021-03-19 MED ORDER — HYDROCHLOROTHIAZIDE 25 MG PO TABS
25.0000 mg | ORAL_TABLET | Freq: Every day | ORAL | Status: DC
  Administered 2021-03-20: 25 mg via ORAL
  Filled 2021-03-19: qty 1

## 2021-03-19 MED ORDER — ONDANSETRON HCL 4 MG/2ML IJ SOLN: 4.0000 mg | Freq: Once | INTRAMUSCULAR | Status: DC | PRN

## 2021-03-19 MED ORDER — THROMBIN 20000 UNITS EX SOLR
CUTANEOUS | Status: AC
  Filled 2021-03-19: qty 20000

## 2021-03-19 MED ORDER — CHLORHEXIDINE GLUCONATE 0.12 % MT SOLN: 15.0000 mL | Freq: Once | OROMUCOSAL | Status: AC

## 2021-03-19 MED ORDER — BUPROPION HCL ER (XL) 150 MG PO TB24
150.0000 mg | ORAL_TABLET | Freq: Every day | ORAL | Status: DC
  Administered 2021-03-19 – 2021-03-20 (×2): 150 mg via ORAL
  Filled 2021-03-19 (×3): qty 1

## 2021-03-19 MED ORDER — PROPOFOL 10 MG/ML IV BOLUS
INTRAVENOUS | Status: AC
  Filled 2021-03-19: qty 20

## 2021-03-19 MED ORDER — QUETIAPINE FUMARATE 100 MG PO TABS
100.0000 mg | ORAL_TABLET | Freq: Every evening | ORAL | Status: DC | PRN
  Filled 2021-03-19: qty 1

## 2021-03-19 MED ORDER — CEFAZOLIN SODIUM-DEXTROSE 2-4 GM/100ML-% IV SOLN
2.0000 g | Freq: Three times a day (TID) | INTRAVENOUS | Status: AC
  Administered 2021-03-19 – 2021-03-20 (×2): 2 g via INTRAVENOUS
  Filled 2021-03-19 (×2): qty 100

## 2021-03-19 MED ORDER — DEXAMETHASONE SODIUM PHOSPHATE 10 MG/ML IJ SOLN
INTRAMUSCULAR | Status: DC | PRN
  Administered 2021-03-19: 10 mg via INTRAVENOUS

## 2021-03-19 MED ORDER — DULOXETINE HCL 30 MG PO CPEP
120.0000 mg | ORAL_CAPSULE | Freq: Every day | ORAL | Status: DC
  Administered 2021-03-20: 120 mg via ORAL
  Filled 2021-03-19: qty 4

## 2021-03-19 MED ORDER — OXYCODONE HCL 5 MG PO TABS
ORAL_TABLET | ORAL | Status: AC
  Filled 2021-03-19: qty 1

## 2021-03-19 MED ORDER — OXYCODONE HCL 5 MG PO TABS
10.0000 mg | ORAL_TABLET | ORAL | Status: DC | PRN
  Administered 2021-03-19 – 2021-03-20 (×5): 10 mg via ORAL
  Filled 2021-03-19 (×5): qty 2

## 2021-03-19 MED ORDER — ACETAMINOPHEN 500 MG PO TABS
ORAL_TABLET | ORAL | Status: AC
  Administered 2021-03-19: 1000 mg via ORAL
  Filled 2021-03-19: qty 2

## 2021-03-19 MED ORDER — FENTANYL CITRATE (PF) 100 MCG/2ML IJ SOLN: 25.0000 ug | INTRAMUSCULAR | Status: DC | PRN

## 2021-03-19 MED ORDER — BUPIVACAINE HCL (PF) 0.25 % IJ SOLN
INTRAMUSCULAR | Status: DC | PRN
  Administered 2021-03-19: 6 mL

## 2021-03-19 MED ORDER — MIDAZOLAM HCL 2 MG/2ML IJ SOLN
INTRAMUSCULAR | Status: AC
  Filled 2021-03-19: qty 2

## 2021-03-19 MED ORDER — DEXAMETHASONE SODIUM PHOSPHATE 4 MG/ML IJ SOLN
4.0000 mg | Freq: Four times a day (QID) | INTRAMUSCULAR | Status: DC
  Administered 2021-03-19 – 2021-03-20 (×2): 4 mg via INTRAVENOUS
  Filled 2021-03-19 (×2): qty 1

## 2021-03-19 MED ORDER — SODIUM CHLORIDE 0.9% FLUSH: 3.0000 mL | INTRAVENOUS | Status: DC | PRN

## 2021-03-19 MED ORDER — SIMETHICONE 80 MG PO CHEW
80.0000 mg | CHEWABLE_TABLET | Freq: Four times a day (QID) | ORAL | Status: DC | PRN
  Filled 2021-03-19: qty 1

## 2021-03-19 MED ORDER — FLUTICASONE PROPIONATE 50 MCG/ACT NA SUSP
1.0000 | Freq: Every day | NASAL | Status: DC | PRN
  Filled 2021-03-19: qty 16

## 2021-03-19 MED ORDER — THROMBIN 20000 UNITS EX SOLR: CUTANEOUS | Status: DC | PRN

## 2021-03-19 MED ORDER — AMLODIPINE BESYLATE 5 MG PO TABS
2.5000 mg | ORAL_TABLET | Freq: Every day | ORAL | Status: DC
  Administered 2021-03-20: 2.5 mg via ORAL
  Filled 2021-03-19: qty 1

## 2021-03-19 MED ORDER — SODIUM CHLORIDE 0.9% FLUSH: 3.0000 mL | Freq: Two times a day (BID) | INTRAVENOUS | Status: DC

## 2021-03-19 SURGICAL SUPPLY — 64 items
ADH SKN CLS APL DERMABOND .7 (GAUZE/BANDAGES/DRESSINGS) ×1
APL SKNCLS STERI-STRIP NONHPOA (GAUZE/BANDAGES/DRESSINGS) ×1
BAG COUNTER SPONGE SURGICOUNT (BAG) ×3 IMPLANT
BAG SPNG CNTER NS LX DISP (BAG) ×1
BAG SURGICOUNT SPONGE COUNTING (BAG) ×1
BASKET BONE COLLECTION (BASKET) ×4 IMPLANT
BENZOIN TINCTURE PRP APPL 2/3 (GAUZE/BANDAGES/DRESSINGS) ×4 IMPLANT
BLADE CLIPPER SURG (BLADE) IMPLANT
BUR CARBIDE MATCH 3.0 (BURR) ×4 IMPLANT
CANISTER SUCT 3000ML PPV (MISCELLANEOUS) ×4 IMPLANT
CLOSURE WOUND 1/2 X4 (GAUZE/BANDAGES/DRESSINGS) ×1
CNTNR URN SCR LID CUP LEK RST (MISCELLANEOUS) ×2 IMPLANT
CONT SPEC 4OZ STRL OR WHT (MISCELLANEOUS) ×3
COVER BACK TABLE 60X90IN (DRAPES) ×4 IMPLANT
DERMABOND ADVANCED (GAUZE/BANDAGES/DRESSINGS) ×2
DERMABOND ADVANCED .7 DNX12 (GAUZE/BANDAGES/DRESSINGS) ×2 IMPLANT
DRAPE C-ARM 42X72 X-RAY (DRAPES) ×8 IMPLANT
DRAPE C-ARMOR (DRAPES) ×2 IMPLANT
DRAPE LAPAROTOMY 100X72X124 (DRAPES) ×4 IMPLANT
DRAPE SURG 17X23 STRL (DRAPES) ×2 IMPLANT
DRSG OPSITE POSTOP 4X6 (GAUZE/BANDAGES/DRESSINGS) ×2 IMPLANT
DURAPREP 26ML APPLICATOR (WOUND CARE) ×4 IMPLANT
ELECT REM PT RETURN 9FT ADLT (ELECTROSURGICAL) ×3
ELECTRODE REM PT RTRN 9FT ADLT (ELECTROSURGICAL) ×2 IMPLANT
EVACUATOR 1/8 PVC DRAIN (DRAIN) ×2 IMPLANT
GAUZE 4X4 16PLY ~~LOC~~+RFID DBL (SPONGE) ×2 IMPLANT
GLOVE SURG ENC MOIS LTX SZ7 (GLOVE) ×2 IMPLANT
GLOVE SURG ENC MOIS LTX SZ8 (GLOVE) ×8 IMPLANT
GLOVE SURG UNDER POLY LF SZ7 (GLOVE) ×2 IMPLANT
GOWN STRL REUS W/ TWL LRG LVL3 (GOWN DISPOSABLE) IMPLANT
GOWN STRL REUS W/ TWL XL LVL3 (GOWN DISPOSABLE) ×4 IMPLANT
GOWN STRL REUS W/TWL 2XL LVL3 (GOWN DISPOSABLE) IMPLANT
GOWN STRL REUS W/TWL LRG LVL3 (GOWN DISPOSABLE) ×15
GOWN STRL REUS W/TWL XL LVL3 (GOWN DISPOSABLE) ×6
GRAFT BONE PROTEIOS LRG 5CC (Orthopedic Implant) ×2 IMPLANT
HEMOSTAT POWDER KIT SURGIFOAM (HEMOSTASIS) ×4 IMPLANT
KIT BASIN OR (CUSTOM PROCEDURE TRAY) ×4 IMPLANT
KIT GRAFTMAG DEL NEURO DISP (NEUROSURGERY SUPPLIES) ×2 IMPLANT
KIT TURNOVER KIT B (KITS) ×4 IMPLANT
MATRIX SPINE STRIP NEOCORE 5CC (Putty) IMPLANT
MILL MEDIUM DISP (BLADE) ×4 IMPLANT
NDL HYPO 25X1 1.5 SAFETY (NEEDLE) ×2 IMPLANT
NEEDLE HYPO 25X1 1.5 SAFETY (NEEDLE) ×3 IMPLANT
NS IRRIG 1000ML POUR BTL (IV SOLUTION) ×4 IMPLANT
PACK LAMINECTOMY NEURO (CUSTOM PROCEDURE TRAY) ×4 IMPLANT
PAD ARMBOARD 7.5X6 YLW CONV (MISCELLANEOUS) ×12 IMPLANT
ROD LORD LIPPED TI 5.5X90 (Rod) ×4 IMPLANT
SCREW KODIAK 6.5X45 (Screw) ×4 IMPLANT
SET SCREW (Screw) ×24 IMPLANT
SET SCREW SPNE (Screw) IMPLANT
SPACER IDENTITI PS 10X9X25 10D (Spacer) ×2 IMPLANT
SPACER TRANSCEND 9X9X25 10D (Spacer) ×2 IMPLANT
SPONGE SURGIFOAM ABS GEL 100 (HEMOSTASIS) ×2 IMPLANT
SPONGE T-LAP 4X18 ~~LOC~~+RFID (SPONGE) ×2 IMPLANT
STRIP CLOSURE SKIN 1/2X4 (GAUZE/BANDAGES/DRESSINGS) ×5 IMPLANT
STRIP MATRIX NEOCORE 5CC (Putty) ×2 IMPLANT
SUT VIC AB 0 CT1 18XCR BRD8 (SUTURE) ×2 IMPLANT
SUT VIC AB 0 CT1 8-18 (SUTURE) ×3
SUT VIC AB 2-0 CP2 18 (SUTURE) ×4 IMPLANT
SUT VIC AB 3-0 SH 8-18 (SUTURE) ×8 IMPLANT
TOWEL GREEN STERILE (TOWEL DISPOSABLE) ×4 IMPLANT
TOWEL GREEN STERILE FF (TOWEL DISPOSABLE) ×4 IMPLANT
TRAY FOLEY MTR SLVR 16FR STAT (SET/KITS/TRAYS/PACK) ×4 IMPLANT
WATER STERILE IRR 1000ML POUR (IV SOLUTION) ×4 IMPLANT

## 2021-03-19 NOTE — Transfer of Care (Signed)
Immediate Anesthesia Transfer of Care Note  Patient: Tammy Mullins  Procedure(s) Performed: Posterior Lumbar Interbody Fusion Lumbar five-Sacral one (Back)  Patient Location: PACU  Anesthesia Type:General  Level of Consciousness: drowsy  Airway & Oxygen Therapy: Patient Spontanous Breathing and Patient connected to face mask oxygen  Post-op Assessment: Report given to RN and Post -op Vital signs reviewed and stable  Post vital signs: Reviewed and stable  Last Vitals:  Vitals Value Taken Time  BP 163/86 03/19/21 1438  Temp    Pulse 80 03/19/21 1440  Resp 13 03/19/21 1440  SpO2 99 % 03/19/21 1440  Vitals shown include unvalidated device data.  Last Pain:  Vitals:   03/19/21 1049  TempSrc:   PainSc: 4       Patients Stated Pain Goal: 3 (95/32/02 3343)  Complications: No notable events documented.

## 2021-03-19 NOTE — Anesthesia Postprocedure Evaluation (Signed)
Anesthesia Post Note  Patient: Tammy Mullins  Procedure(s) Performed: Posterior Lumbar Interbody Fusion Lumbar five-Sacral one (Back)     Patient location during evaluation: PACU Anesthesia Type: General Level of consciousness: awake and alert Pain management: pain level controlled Vital Signs Assessment: post-procedure vital signs reviewed and stable Respiratory status: spontaneous breathing, nonlabored ventilation and respiratory function stable Cardiovascular status: stable and blood pressure returned to baseline Anesthetic complications: no   No notable events documented.  Last Vitals:  Vitals:   03/19/21 1540 03/19/21 1608  BP: (!) 144/73 (!) 142/72  Pulse: 74 75  Resp: 15 18  Temp:  36.6 C  SpO2: 98% 95%    Last Pain:  Vitals:   03/19/21 1608  TempSrc: Oral  PainSc:                  Audry Pili

## 2021-03-19 NOTE — Op Note (Signed)
03/19/2021  2:44 PM  PATIENT:  Tammy Mullins  71 y.o. 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 Decompressive lumbar laminectomy, hemi facetectomy and foraminotomies L5-S1 bilaterally with resection of synovial cyst at L5-S1 on the right compressing of the right L5 nerve root  2. Posterior lumbar interbody fusion L5-S1 using titanium and peek interbody cages packed with morcellized allograft and autograft  3. Posterior fixation L3-S1 inclusive using Alphatec cortical pedicle screws.  4. Intertransverse arthrodesis L5-S1 left using morcellized autograft and allograft. 5.  Exploration of fusion L3-L5 with extraction of segmental instrumentation  SURGEON:  Sherley Bounds, MD  ASSISTANTS: Glenford Peers FNP  ANESTHESIA:  General  EBL: 300 ml  Total I/O In: 800 [I.V.:800] Out: 435 [Urine:135; Blood:300]  BLOOD ADMINISTERED:none  DRAINS: none   INDICATION FOR PROCEDURE: This patient presented with severe right leg pain. Imaging revealed Revis L3-L5 fusion with adjacent level disease at L5-S1 with right foraminal stenosis with a right foraminal synovial cyst compressing the right L5 nerve root. The patient tried a reasonable attempt at conservative medical measures without relief. I recommended decompression and instrumented fusion to address the stenosis as well as the segmental  instability.  Patient understood the risks, benefits, and alternatives and potential outcomes and wished to proceed.  PROCEDURE DETAILS:  The patient was brought to the operating room. After induction of generalized endotracheal anesthesia the patient was rolled into the prone position on chest rolls and all pressure points were padded. The patient's lumbar region was cleaned and then prepped with DuraPrep and draped in the usual sterile fashion.  Anesthesia was injected and then a dorsal midline incision was made and carried down to the lumbosacral fascia. The fascia was opened and the paraspinous musculature was taken down in a subperiosteal fashion to expose L5-S1 as well as the old instrumentation. A self-retaining retractor was placed. Intraoperative fluoroscopy confirmed my level, and I started with the removal of the previously placed segmental instrumentation.  We remove the locking caps at L3-L4 and L5 bilaterally and remove the rods.  We then pulled on each screw successively to explore the fusion and noticed movement of the entire segment.  All of the screws had good purchase and none had loosened.   I then turned my attention to the decompression and complete lumbar laminectomies, hemi- facetectomies, and foraminotomies were performed at L5-S1 bilaterally.  My nurse practitioner was directly involved in the decompression and exposure of the neural elements. the patient had significant spinal stenosis and this required more work than would be required for a simple exposure of the disc for posterior lumbar interbody fusion which would only require a limited laminotomy. Much more generous decompression and generous foraminotomy was undertaken in order to adequately decompress the neural elements and address the patient's leg pain. The yellow ligament was removed to expose the underlying dura and nerve roots, and generous foraminotomies were performed to adequately decompress the neural elements. Both the exiting and traversing nerve roots were decompressed on both sides until a coronary dilator passed easily along the nerve roots.  At L5-S1 on the right there was a synovial cyst both medially and far laterally coming out of the joint compressing the L5 nerve root.  This was carefully removed by dissecting it away from the nerve root and then removing it with the Kerrison punch and with Bovie cautery to shrink what  was left.  We then passed a  coronary dilator along the L5 nerve root and it was free.    Once the decompression was complete, I turned my attention to the posterior lumbar interbody fusion. The epidural venous vasculature was coagulated and cut sharply. Disc space was incised and the initial discectomy was performed with pituitary rongeurs. The disc space was distracted with sequential distractors to a height of 10 mm. We then used a series of scrapers and shavers to prepare the endplates for fusion. The midline was prepared with Epstein curettes. Once the complete discectomy was finished, we packed an appropriate sized interbody cage with local autograft and morcellized allograft, gently retracted the nerve root, and tapped the cage into position at L5-S1 bilaterally.  The midline between the cages was packed with morselized autograft and allograft.   We then turned our attention to the placement of the lower S1 pedicle screws. The pedicle screw entry zones were identified utilizing surface landmarks and fluoroscopy. I drilled into each pedicle utilizing the hand drill, and tapped each pedicle with the appropriate height 0.5 tap. We palpated with a ball probe to assure no break in the cortex. We then placed 6.5 x 45 mm pedicle screws into the pedicles bilaterally at S1.  My nurse practitioner assisted in placement of the pedicle screws.  We then decorticated the transverse processes and laid a mixture of morcellized autograft and allograft out over these to perform intertransverse arthrodesis at L5-S1 on the left. We then placed lordotic rods into the multiaxial screw heads of the pedicle screws and locked these in position with the locking caps and anti-torque device all achieving compression between L5 and S1. We then checked our construct with AP and lateral fluoroscopy. Irrigated with copious amounts of bacitracin-containing saline solution. Inspected the nerve roots once again to assure adequate decompression, lined to the dura with  Gelfoam,  and then we closed the muscle and the fascia with 0 Vicryl. Closed the subcutaneous tissues with 2-0 Vicryl and subcuticular tissues with 3-0 Vicryl. The skin was closed with benzoin and Steri-Strips. Dressing was then applied, the patient was awakened from general anesthesia and transported to the recovery room in stable condition. At the end of the procedure all sponge, needle and instrument counts were correct.   PLAN OF CARE: admit to inpatient  PATIENT DISPOSITION:  PACU - hemodynamically stable.   Delay start of Pharmacological VTE agent (>24hrs) due to surgical blood loss or risk of bleeding:  yes  \

## 2021-03-19 NOTE — H&P (Signed)
Subjective: Patient is a 71 y.o. female admitted for plif. Onset of symptoms was several months ago, gradually worsening since that time.  The pain is rated severe, and is located at the across the lower back and radiates to RLE in L5 distribution. The pain is described as aching and occurs all day. The symptoms have been progressive. Symptoms are exacerbated by exercise, standing, and walking for more than a few minutes. MRI or CT showed adjacent level dz L5-S1   Past Medical History:  Diagnosis Date   Allergic rhinitis, cause unspecified    Allergy    Anxiety state, unspecified    Arthritis    Attention deficit disorder without mention of hyperactivity    Barrett's esophagus    Blood transfusion without reported diagnosis 2018-10   Chronic pain syndrome    Colon ulcer - IC valve 08/10/2016   Depression    Diabetes type 2, controlled ([place]) 01/09/2014   Diverticulosis of colon (without mention of hemorrhage)    Esophageal reflux    Fibromyalgia    Gout, unspecified    Headache    Hiatal hernia    Irritable bowel syndrome    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    Myalgia and myositis, unspecified    Osteoarthrosis, unspecified whether generalized or localized, unspecified site    Personal history of unspecified circulatory disease    Pure hypercholesterolemia    Radial styloid tenosynovitis    Unspecified essential hypertension    Unspecified nonpsychotic mental disorder    Unspecified pruritic disorder     Past Surgical History:  Procedure Laterality Date   ANKLE SURGERY Right    APPENDECTOMY     CARPAL TUNNEL RELEASE Right    CERVICAL SPINE SURGERY     plates and pins   CHOLECYSTECTOMY N/A 06/26/2015   Procedure: LAPAROSCOPIC CHOLECYSTECTOMY;  Surgeon: Ralene Ok, MD;  Location: [place];  Service: General;  Laterality: N/A;   COLONOSCOPY     COLONOSCOPY  2019   ESOPHAGOGASTRODUODENOSCOPY     ESOPHAGOGASTRODUODENOSCOPY N/A 12/04/2016   Procedure:  ESOPHAGOGASTRODUODENOSCOPY (EGD);  Surgeon: Ladene Artist, MD;  Location[facility] ENDOSCOPY;  Service: Endoscopy;  Laterality: N/A;   LAPAROSCOPIC APPENDECTOMY N/A 06/26/2015   Procedure: LAPAROSCOPIC APPENDECTOMY;  Surgeon: Ralene Ok, MD;  Location: [place];  Service: General;  Laterality: N/A;   ROTATOR CUFF REPAIR Right    SALPINGOOPHORECTOMY  Left   ectopic 1980   TOTAL SHOULDER ARTHROPLASTY     TRANSFORAMINAL LUMBAR INTERBODY FUSION (TLIF) WITH PEDICLE SCREW FIXATION 2 LEVEL N/A 01/28/2019   Procedure: TRANSFORAMINAL LUMBAR INTERBODY FUSION LUMBAR THREE-FOUR, LUMBAR FOUR-FIVE;  Surgeon: Eustace Moore, MD;  Location: [place];  Service: Neurosurgery;  Laterality: N/A;  posterior   UPPER GASTROINTESTINAL ENDOSCOPY      Prior to Admission medications   Medication Sig Start Date End Date Taking? Authorizing Provider  allopurinol (ZYLOPRIM) 300 MG tablet TAKE 1 AND 1/2 TABLETS(450 MG) BY MOUTH DAILY 11/25/19  Yes Deveshwar, Abel Presto, MD  amLODipine (NORVASC) 10 MG tablet TAKE 1 TABLET DAILY 10/21/13  Yes Debbrah Alar, NP  aspirin 81 MG tablet Take 81 mg by mouth daily.   Yes [provider]  B Complex-C (B-COMPLEX WITH VITAMIN C) tablet Take 1 tablet by mouth daily.   Yes [provider]  buPROPion (WELLBUTRIN XL) 150 MG 24 hr tablet TAKE 1 TABLET(150 MG) BY MOUTH EVERY MORNING 01/08/21  Yes Hurst, Teresa T, PA-C  butalbital-acetaminophen-caffeine (FIORICET) 50-325-40 MG tablet Take 1 tablet by mouth every  6 (six) hours as needed for migraine. 08/05/20  Yes Suzzanne Cloud, NP  CALCIUM-MAGNESIUM-ZINC PO Take 1 tablet by mouth daily.   Yes [provider]  Cholecalciferol (VITAMIN D) 2000 UNITS CAPS Take 2,000 Units by mouth daily.    Yes [provider]  DULoxetine (CYMBALTA) 60 MG capsule TAKE 2 CAPSULES BY MOUTH DAILY 09/21/20  Yes Hurst, Teresa T, PA-C  famotidine (PEPCID) 40 MG tablet Take 40 mg by mouth daily.   Yes [provider]  ferrous  sulfate 325 (65 FE) MG tablet Take 1 tablet (325 mg total) by mouth 2 (two) times daily with a meal. Patient taking differently: Take 325 mg by mouth daily with breakfast. 12/05/16  Yes Georgette Shell, MD  gabapentin (NEURONTIN) 300 MG capsule Take 2 capsules (600 mg total) by mouth 3 (three) times daily. Patient taking differently: Take 600 mg by mouth 3 (three) times daily as needed (pain). 02/16/21  Yes Suzzanne Cloud, NP  Garlic 3557 MG CAPS Take 1,000 mg by mouth daily.   Yes [provider]  hydrochlorothiazide (HYDRODIURIL) 25 MG tablet Take 25 mg by mouth daily.   Yes [provider]  hyoscyamine (LEVSIN) 0.125 MG tablet Take 0.125 mg by mouth every 4 (four) hours as needed for cramping.   Yes [provider]  losartan (COZAAR) 100 MG tablet Take 1 tablet (100 mg total) by mouth daily. 05/08/12  Yes Noralee Space, MD  methocarbamol (ROBAXIN) 750 MG tablet Take 750 mg by mouth every 6 (six) hours as needed for muscle spasms. 05/23/19  Yes [provider]  metoprolol succinate (TOPROL-XL) 100 MG 24 hr tablet TAKE 1 TABLET DAILY 10/21/13  Yes Debbrah Alar, NP  omeprazole (PRILOSEC) 40 MG capsule Take 40 mg by mouth daily.   Yes [provider]  QUEtiapine (SEROQUEL) 400 MG tablet TAKE 1 TABLET(400 MG) BY MOUTH AT BEDTIME 04/29/20  Yes Hurst, Teresa T, PA-C  zonisamide (ZONEGRAN) 25 MG capsule Take 3 capsules (75 mg total) by mouth at bedtime. 02/16/21  Yes Suzzanne Cloud, NP  fluticasone Asencion Islam) 50 MCG/ACT nasal spray Place 1 spray into both nostrils as needed for allergies or rhinitis.    [provider]  simethicone (MYLICON) 322 MG chewable tablet Chew 125 mg by mouth every 6 (six) hours as needed for flatulence.    [provider]   Allergies  Allergen Reactions   Crestor [Rosuvastatin Calcium] Other (See Comments)    Causes severe body aches   Asa [Aspirin]     GI upset in hight doses can take a 81mg    Topiramate  Diarrhea   Celebrex [Celecoxib] Swelling and Rash    Swelling in legs    Social History   Tobacco Use   Smoking status: Never   Smokeless tobacco: Never  Substance Use Topics   Alcohol use: No    Family History  Problem Relation Age of Onset   Cirrhosis Father    Cancer Maternal Grandmother        ?   Lung disease Mother        ?    Glaucoma Mother    Glaucoma Brother    Prostate cancer Brother    Heart disease Sister    Heart attack Sister    Kidney disease Neg Hx    Diabetes Neg Hx    Colon cancer Neg Hx    Colon polyps Neg Hx    Rectal cancer Neg Hx    Stomach  cancer Neg Hx      Review of Systems  Positive ROS: neg  All other systems have been reviewed and were otherwise negative with the exception of those mentioned in the HPI and as above.  Objective: Vital signs in last 24 hours: Temp:  [98.1 F (36.7 C)] 98.1 F (36.7 C) (01/20 1020) Pulse Rate:  [80] 80 (01/20 1020) Resp:  [18] 18 (01/20 1020) BP: (170)/(85) 170/85 (01/20 1020) SpO2:  [98 %] 98 % (01/20 1020) Weight:  [78 kg] 78 kg (01/20 1020)  General Appearance: Alert, cooperative, no distress, appears stated age Head: Normocephalic, without obvious abnormality, atraumatic Eyes: PERRL, conjunctiva/corneas clear, EOM's intact    Neck: Supple, symmetrical, trachea midline Back: Symmetric, no curvature, ROM normal, no CVA tenderness Lungs:  respirations unlabored Heart: Regular rate and rhythm Abdomen: Soft, non-tender Extremities: Extremities normal, atraumatic, no cyanosis or edema Pulses: 2+ and symmetric all extremities Skin: Skin color, texture, turgor normal, no rashes or lesions  NEUROLOGIC:   Mental status: Alert and oriented x4,  no aphasia, good attention span, fund of knowledge, and memory Motor Exam - grossly normal Sensory Exam - grossly normal Reflexes: 1= Coordination - grossly normal Gait - grossly normal Balance - grossly normal Cranial Nerves: I: smell Not tested  II:  visual acuity  OS: nl    OD: nl  II: visual fields Full to confrontation  II: pupils Equal, round, reactive to light  III,VII: ptosis None  III,IV,VI: extraocular muscles  Full ROM  V: mastication Normal  V: facial light touch sensation  Normal  V,VII: corneal reflex  Present  VII: facial muscle function - upper  Normal  VII: facial muscle function - lower Normal  VIII: hearing Not tested  IX: soft palate elevation  Normal  IX,X: gag reflex Present  XI: trapezius strength  5/5  XI: sternocleidomastoid strength 5/5  XI: neck flexion strength  5/5  XII: tongue strength  Normal    Data Review Lab Results  Component Value Date   WBC 8.6 03/17/2021   HGB 11.3 (L) 03/17/2021   HCT 36.7 03/17/2021   MCV 97.3 03/17/2021   PLT 289 03/17/2021   Lab Results  Component Value Date   NA 142 03/17/2021   K 3.8 03/17/2021   CL 108 03/17/2021   CO2 25 03/17/2021   BUN 12 03/17/2021   CREATININE 0.70 03/17/2021   GLUCOSE 117 (H) 03/17/2021   Lab Results  Component Value Date   INR 1.0 03/17/2021    Assessment/Plan:  Estimated body mass index is 28.62 kg/m as calculated from the following:   Height as of this encounter: 5\' 5"  (1.651 m).   Weight as of this encounter: 78 kg. Patient admitted for PLIF L5-S1. Patient has failed a reasonable attempt at conservative therapy.  I explained the condition and procedure to the patient and answered any questions.  Patient wishes to proceed with procedure as planned. Understands risks/ benefits and typical outcomes of procedure which include but are not limited to bleeding, infection, nerve injury, CSF leak, numbness, weakness, paralysis, loss of bowel bladder or sexual function, pseudoarthrosis, misplaced instrumentation, need for instrumentation revision, need for further surgery, lack of relief of symptoms, worsening symptoms, vascular injury, and anesthesia risk including DVT pneumonia MI and death.  She agrees to proceed.   Eustace Moore 03/19/2021 10:52 AM

## 2021-03-19 NOTE — Anesthesia Preprocedure Evaluation (Addendum)
Anesthesia Evaluation  Patient identified by MRN, date of birth, ID band Patient awake    Reviewed: Allergy & Precautions, NPO status , Patient's Chart, lab work & pertinent test results, reviewed documented beta blocker date and time   History of Anesthesia Complications Negative for: history of anesthetic complications  Airway Mallampati: II  TM Distance: >3 FB Neck ROM: Full    Dental  (+) Dental Advisory Given   Pulmonary sleep apnea ,    Pulmonary exam normal        Cardiovascular hypertension, Pt. on medications and Pt. on home beta blockers Normal cardiovascular exam     Neuro/Psych  Headaches, PSYCHIATRIC DISORDERS Anxiety Depression Bipolar Disorder    GI/Hepatic Neg liver ROS, hiatal hernia, PUD, GERD  Medicated and Controlled,  Endo/Other  diabetes, Well Controlled, Type 2  Renal/GU negative Renal ROS     Musculoskeletal  (+) Arthritis , Fibromyalgia - Gout    Abdominal   Peds  Hematology negative hematology ROS (+)   Anesthesia Other Findings   Reproductive/Obstetrics                            Anesthesia Physical Anesthesia Plan  ASA: 3  Anesthesia Plan: General   Post-op Pain Management: Tylenol PO (pre-op) and Ketamine IV   Induction: Intravenous  PONV Risk Score and Plan: 4 or greater and Treatment may vary due to age or medical condition, Ondansetron and Dexamethasone  Airway Management Planned: Oral ETT  Additional Equipment: None  Intra-op Plan:   Post-operative Plan: Extubation in OR  Informed Consent: I have reviewed the patients History and Physical, chart, labs and discussed the procedure including the risks, benefits and alternatives for the proposed anesthesia with the patient or authorized representative who has indicated his/her understanding and acceptance.     Dental advisory given  Plan Discussed with: CRNA and Anesthesiologist  Anesthesia  Plan Comments:        Anesthesia Quick Evaluation

## 2021-03-19 NOTE — Anesthesia Procedure Notes (Signed)
Procedure Name: Intubation Date/Time: 03/19/2021 11:40 AM Performed by: Clearnce Sorrel, CRNA Pre-anesthesia Checklist: Patient identified, Emergency Drugs available, Suction available and Patient being monitored Patient Re-evaluated:Patient Re-evaluated prior to induction Oxygen Delivery Method: Circle System Utilized Preoxygenation: Pre-oxygenation with 100% oxygen Induction Type: IV induction Ventilation: Mask ventilation without difficulty Laryngoscope Size: Mac and 3 Grade View: Grade II Tube type: Oral Tube size: 7.0 mm Number of attempts: 1 Airway Equipment and Method: Stylet and Oral airway Placement Confirmation: ETT inserted through vocal cords under direct vision, positive ETCO2 and breath sounds checked- equal and bilateral Secured at: 22 cm Tube secured with: Tape Dental Injury: Teeth and Oropharynx as per pre-operative assessment

## 2021-03-20 DIAGNOSIS — M4726 Other spondylosis with radiculopathy, lumbar region: Secondary | ICD-10-CM | POA: Diagnosis not present

## 2021-03-20 LAB — GLUCOSE, CAPILLARY: Glucose-Capillary: 116 mg/dL — ABNORMAL HIGH (ref 70–99)

## 2021-03-20 MED ORDER — OXYCODONE HCL 10 MG PO TABS: 10.0000 mg | ORAL_TABLET | ORAL | 0 refills | Status: DC | PRN

## 2021-03-20 NOTE — TOC Transition Note (Signed)
Transition of Care [facility]) - CM/SW Discharge Note   Patient Details  Name: Tammy Mullins MRN: [id number] Date of Birth: [birth year]  [facility] CM/SW Contact:  Carles Collet, RN Phone Number: 03/20/2021, 9:44 AM   Clinical Narrative:    Damaris Schooner w patient over the phone, Agreeable to [facility] services, prefers [facility], referral accepted. Floor staff to provide any DME needed fro unit stock.  Patient will have help from family when [place].     Final next level of care: [place] Barriers to Discharge: No Barriers Identified   Patient Goals and CMS Choice Patient states their goals for this hospitalization and ongoing recovery are:: to go home CMS Medicare.gov Compare Post Acute Care list provided to:: Patient Choice offered to / list presented to : Patient  Discharge Placement                       Discharge Plan and Services                            [facility] Agency: [facility] [place] Date [facility] Agency Contacted: 03/20/21 Time [place]: [phone] Representative spoke with at [place]: [place] ([place]) Interventions     Readmission Risk Interventions No flowsheet data found.

## 2021-03-20 NOTE — Discharge Summary (Signed)
Physician Discharge Summary  Patient ID: Tammy Mullins MRN: [id number] DOB/AGE: 07-20-1950 71 y.o.  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 female that underwent an elective L5-S1 PLIF, extension fusion from L3-5.  She tolerated the surgery well, postoperatively her pain was controlled on oral medication.  She was at her neurologic baseline, ambulating independently.  Her preoperative radiculopathy was improved.  Her incision was clean dry and intact.  She was having normal bowel bladder function upon discharge.  Treatments: Surgery -L5-S1 PLIF, exploration of fusion L3-5  Discharge Exam: Blood pressure (!) 149/82, pulse 78, temperature 98.1 F (36.7 C), temperature source Oral, resp. rate 18, height 5\' 5"  (1.651 m), weight 78 kg, SpO2 93 %. Awake, alert, oriented x3 Speech fluent, appropriate CN grossly intact 5/5 BUE/BLE Wound c/d/i  Disposition: Discharge disposition: 01-Home or Self Care        Allergies as of 03/20/2021       Reactions   Crestor [rosuvastatin Calcium] Other (See Comments)   Causes severe body aches   Asa [aspirin]    GI upset in hight doses can take a 81mg    Topiramate Diarrhea   Celebrex [celecoxib] Swelling, Rash   Swelling in legs        Medication List     TAKE these medications    allopurinol 300 MG tablet Commonly known as: ZYLOPRIM TAKE 1 AND 1/2 TABLETS(450 MG) BY MOUTH DAILY   amLODipine 10 MG tablet Commonly known as: NORVASC TAKE 1 TABLET DAILY   aspirin 81 MG tablet Take 81 mg by mouth daily.   B-complex with vitamin C tablet Take 1 tablet by mouth daily.   buPROPion 150 MG 24 hr tablet Commonly known as: WELLBUTRIN  XL TAKE 1 TABLET(150 MG) BY MOUTH EVERY MORNING   butalbital-acetaminophen-caffeine 50-325-40 MG tablet Commonly known as: FIORICET Take 1 tablet by mouth every 6 (six) hours as needed for migraine.   CALCIUM-MAGNESIUM-ZINC PO Take 1 tablet by mouth daily.   DULoxetine 60 MG capsule Commonly known as: CYMBALTA TAKE 2 CAPSULES BY MOUTH DAILY   famotidine 40 MG tablet Commonly known as: PEPCID Take 40 mg by mouth daily.   ferrous sulfate 325 (65 FE) MG tablet Take 1 tablet (325 mg total) by mouth 2 (two) times daily with a meal. What changed: when to take this   fluticasone 50 MCG/ACT nasal spray Commonly known as: FLONASE Place 1 spray into both nostrils as needed for allergies or rhinitis.   gabapentin 300 MG capsule Commonly known as: NEURONTIN Take 2 capsules (600 mg total) by mouth 3 (three) times daily. What changed:  when to take this reasons to take this   Garlic 1884 MG Caps Take 1,000 mg by mouth daily.   hydrochlorothiazide 25 MG tablet Commonly known as: HYDRODIURIL Take 25 mg by mouth daily.   hyoscyamine 0.125 MG tablet Commonly known as: LEVSIN Take 0.125 mg by mouth every 4 (four) hours as needed for cramping.   losartan 100 MG tablet Commonly known as: COZAAR Take 1 tablet (100 mg total) by mouth daily.   methocarbamol 750 MG tablet Commonly known as: ROBAXIN Take 750 mg by mouth every 6 (six) hours as needed for muscle spasms.   metoprolol succinate 100 MG 24 hr  tablet Commonly known as: TOPROL-XL TAKE 1 TABLET DAILY   omeprazole 40 MG capsule Commonly known as: PRILOSEC Take 40 mg by mouth daily.   Oxycodone HCl 10 MG Tabs Take 1 tablet (10 mg total) by mouth every 4 (four) hours as needed for severe pain ((score 7 to 10)).   QUEtiapine 400 MG tablet Commonly known as: SEROQUEL TAKE 1 TABLET(400 MG) BY MOUTH AT BEDTIME   simethicone 125 MG chewable tablet Commonly known as: MYLICON Chew 161 mg by mouth every 6 (six) hours as needed  for flatulence.   Vitamin D 50 MCG (2000 UT) Caps Take 2,000 Units by mouth daily.   zonisamide 25 MG capsule Commonly known as: ZONEGRAN Take 3 capsules (75 mg total) by mouth at bedtime.               Durable Medical Equipment  (From admission, onward)           Start     Ordered   03/19/21 1604  DME Walker rolling  Once       Question:  Patient needs a walker to treat with the following condition  Answer:  S/P lumbar fusion   03/19/21 1603   03/19/21 1604  DME 3 n 1  Once        03/19/21 1603            Follow-up Information     Eustace Moore, MD Follow up in 2 week(s).   Specialty: Neurosurgery Contact information: [address] [place] [address] [phone]                 Signed: Theodoro Doing Maurica Omura 03/20/2021, 8:34 AM

## 2021-03-20 NOTE — Progress Notes (Addendum)
Patient is discharged from room 3C08 at this time. Alert and in stable condition. IV site d/c'd and instructions read to patient with understanding verbalized and all questions answered. Left unit via wheelchair with all belongings at side. 

## 2021-03-20 NOTE — Discharge Instructions (Signed)
Wound Care Keep incision covered and dry for two days.  If you shower, cover incision with plastic wrap.  Do not put any creams, lotions, or ointments on incision. Leave steri-strips on back.  They will fall off by themselves. Activity Walk each and every day, increasing distance each day. No lifting greater than 5 lbs.  Avoid Bending, Lifting and Twisting. No driving for 2 weeks; may ride as a passenger locally. If provided with back brace, wear when out of bed.  It is not necessary to wear brace in bed. Diet Resume your normal diet.  Return to Work Will be discussed at you follow up appointment. Call Your Doctor If Any of These Occur Redness, drainage, or swelling at the wound.  Temperature greater than 101 degrees. Severe pain not relieved by pain medication. Incision starts to come apart. Follow Up Appt Call  [phone])  for problems.  If you have any hardware placed in your spine, you will need an x-ray before your appointment.

## 2021-03-20 NOTE — Evaluation (Addendum)
Physical Therapy Evaluation Patient Details Name: Tammy Mullins MRN: [id number] DOB: Dec 24, 1950 Today's Date: 03/20/2021  History of Present Illness  Pt is a 71 y/o female s/p L5-S1 PLIF. PMH includes DM and fibromyalgia.  Clinical Impression  Patient is s/p above surgery resulting in the deficits listed below (see PT Problem List). Pt with mild unsteadiness and pain in RLE. Min guard to supervision for mobility tasks using RW. Pt with slowed processing and decreased problem solving ability as well. Reviewed back precautions and generalized walking program. Reports family will be able to assist at home. Patient will benefit from skilled PT to increase their independence and safety with mobility (while adhering to their precautions) to allow discharge to the venue listed below.        Recommendations for follow up therapy are one component of a multi-disciplinary discharge planning process, led by the attending physician.  Recommendations may be updated based on patient status, additional functional criteria and insurance authorization.  Follow Up Recommendations Home health PT    Assistance Recommended at Discharge Frequent or constant Supervision/Assistance  Patient can return home with the following  A little help with bathing/dressing/bathroom    Equipment Recommendations None recommended by PT  Recommendations for Other Services       Functional Status Assessment Patient has had a recent decline in their functional status and demonstrates the ability to make significant improvements in function in a reasonable and predictable amount of time.     Precautions / Restrictions Precautions Precautions: Fall;Back Precaution Booklet Issued: Yes (comment) Precaution Comments: Reviewed back precautions with pt Required Braces or Orthoses: Spinal Brace Spinal Brace: Lumbar corset Restrictions Weight Bearing Restrictions: No      Mobility  Bed Mobility               General  bed mobility comments: Sitting EOB upon entry    Transfers Overall transfer level: Needs assistance Equipment used: Rolling walker (2 wheels) Transfers: Sit to/from Stand Sit to Stand: Min guard           General transfer comment: Min guard for safety. Cues for safe hand placement.    Ambulation/Gait Ambulation/Gait assistance: Min guard, Supervision Gait Distance (Feet): 125 Feet Assistive device: Rolling walker (2 wheels) Gait Pattern/deviations: Step-to pattern, Step-through pattern, Decreased step length - right, Decreased step length - left, Decreased weight shift to right, Antalgic Gait velocity: Decreased     General Gait Details: Very slow, antalgic gait. Min guard to supervision for safety. Cues for upright posture and proximity to device. Educated about walking program to perform at home.  Stairs Stairs: Yes Stairs assistance: Min guard Stair Management: One rail Left, Step to pattern, Forwards Number of Stairs: 2 General stair comments: Min guard for safety. Cues for LE sequencing.  Wheelchair Mobility    Modified Rankin (Stroke Patients Only)       Balance Overall balance assessment: Needs assistance Sitting-balance support: No upper extremity supported, Feet supported Sitting balance-Leahy Scale: Fair     Standing balance support: Bilateral upper extremity supported Standing balance-Leahy Scale: Poor Standing balance comment: Reliant on BUE support                             Pertinent Vitals/Pain Pain Assessment Pain Assessment: Faces Faces Pain Scale: Hurts little more Pain Location: back and R thigh Pain Descriptors / Indicators: Grimacing, Guarding Pain Intervention(s): Limited activity within patient's tolerance, Monitored during session, Repositioned    Home Living  Family/patient expects to be discharged to:: Private residence Living Arrangements: Alone Available Help at Discharge: Family;Available 24 hours/day (niece will  help at d/c) Type of Home: House Home Access: Stairs to enter Entrance Stairs-Rails: None (pulls up on doorframe) Entrance Stairs-Number of Steps: 1   Home Layout: One level Home Equipment: Advice worker (2 wheels);Cane - single point;Grab bars - tub/shower      Prior Function Prior Level of Function : Independent/Modified Independent             Mobility Comments: Was using RW for mobility secondary to pain       Hand Dominance        Extremity/Trunk Assessment   Upper Extremity Assessment Upper Extremity Assessment: Defer to OT evaluation    Lower Extremity Assessment Lower Extremity Assessment: Generalized weakness;RLE deficits/detail RLE Deficits / Details: Reports R thigh pain, but reports it has improved since surgery.    Cervical / Trunk Assessment Cervical / Trunk Assessment: Back Surgery  Communication   Communication: No difficulties  Cognition Arousal/Alertness: Awake/alert Behavior During Therapy: WFL for tasks assessed/performed Overall Cognitive Status: No family/caregiver present to determine baseline cognitive functioning                                 General Comments: Slow processing and difficulty with problem solving. Reporting some conflicting information throughout.        General Comments      Exercises     Assessment/Plan    PT Assessment Patient needs continued PT services  PT Problem List Decreased strength;Decreased balance;Decreased mobility;Decreased activity tolerance;Decreased knowledge of use of DME;Decreased knowledge of precautions;Pain;Decreased cognition;Decreased safety awareness       PT Treatment Interventions DME instruction;Gait training;Functional mobility training;Therapeutic activities;Balance training;Therapeutic exercise;Stair training;Patient/family education    PT Goals (Current goals can be found in the Care Plan section)  Acute Rehab PT Goals Patient Stated Goal: to go  home PT Goal Formulation: With patient Time For Goal Achievement: 04/03/21 Potential to Achieve Goals: Good    Frequency Min 5X/week     Co-evaluation               AM-PAC PT "6 Clicks" Mobility  Outcome Measure Help needed turning from your back to your side while in a flat bed without using bedrails?: A Little Help needed moving from lying on your back to sitting on the side of a flat bed without using bedrails?: A Little Help needed moving to and from a bed to a chair (including a wheelchair)?: A Little Help needed standing up from a chair using your arms (e.g., wheelchair or bedside chair)?: A Little Help needed to walk in hospital room?: A Little Help needed climbing 3-5 steps with a railing? : A Little 6 Click Score: 18    End of Session Equipment Utilized During Treatment: Gait belt;Back brace Activity Tolerance: Patient tolerated treatment well Patient left: in bed;with call bell/phone within reach (sitting EOB) Nurse Communication: Mobility status PT Visit Diagnosis: Muscle weakness (generalized) (M62.81);Unsteadiness on feet (R26.81)    Time: 6734-1937 PT Time Calculation (min) (ACUTE ONLY): 19 min   Charges:   PT Evaluation $PT Eval Low Complexity: 1 Low          Lou Miner, DPT  Acute Rehabilitation Services  Pager: [phone] Office: [phone]   Rudean Hitt 03/20/2021, 9:10 AM

## 2021-03-20 NOTE — Evaluation (Signed)
Occupational Therapy Evaluation Patient Details Name: Tammy Mullins MRN: [id number] DOB: 1950-05-08 Today's Date: 03/20/2021   History of Present Illness Pt is a 71 y/o female s/p L5-S1 PLIF. PMH includes DM and fibromyalgia.   Clinical Impression   Patient evaluated by Occupational Therapy with no further acute OT needs identified. All education has been completed and the patient has no further questions. Pt is able to complete ADLs with supervision, but requires cues for sequencing, problem solving and back precautions.  She has good family support at home.  See below for any follow-up Occupational Therapy or equipment needs. OT is signing off. Thank you for this referral.       Recommendations for follow up therapy are one component of a multi-disciplinary discharge planning process, led by the attending physician.  Recommendations may be updated based on patient status, additional functional criteria and insurance authorization.   Follow Up Recommendations  Home health OT    Assistance Recommended at Discharge Intermittent Supervision/Assistance  Patient can return home with the following A little help with walking and/or transfers;A little help with bathing/dressing/bathroom;Assistance with cooking/housework;Direct supervision/assist for medications management;Assist for transportation    Functional Status Assessment  Patient has had a recent decline in their functional status and demonstrates the ability to make significant improvements in function in a reasonable and predictable amount of time.  Equipment Recommendations  None recommended by OT    Recommendations for Other Services       Precautions / Restrictions Precautions Precautions: Fall;Back Precaution Booklet Issued: Yes (comment) Precaution Comments: Reviewed back precautions with pt - she requires min cues to adhere to them Required Braces or Orthoses: Spinal Brace Spinal Brace: Lumbar  corset Restrictions Weight Bearing Restrictions: No      Mobility Bed Mobility                    Transfers Overall transfer level: Needs assistance Equipment used: Rolling walker (2 wheels) Transfers: Sit to/from Stand, Bed to chair/wheelchair/BSC Sit to Stand: Supervision     Step pivot transfers: Supervision     General transfer comment: Min guard for safety. Cues for safe hand placement.      Balance Overall balance assessment: Needs assistance Sitting-balance support: No upper extremity supported, Feet supported Sitting balance-Leahy Scale: Good     Standing balance support: During functional activity Standing balance-Leahy Scale: Fair                             ADL either performed or assessed with clinical judgement   ADL Overall ADL's : Needs assistance/impaired Eating/Feeding: Independent   Grooming: Wash/dry hands;Wash/dry face;Oral care;Brushing hair;Supervision/safety;Standing   Upper Body Bathing: Supervision/ safety;Set up;Sitting   Lower Body Bathing: Set up;Supervison/ safety;Sit to/from stand;Cueing for back precautions;Cueing for compensatory techniques   Upper Body Dressing : Set up;Supervision/safety;Cueing for sequencing;Sitting   Lower Body Dressing: Supervision/safety;Cueing for compensatory techniques;Cueing for sequencing;Cueing for back precautions;Sit to/from stand   Toilet Transfer: Supervision/safety;Cueing for sequencing;Ambulation;Comfort height toilet;Grab bars;Rolling walker (2 wheels)   Toileting- Clothing Manipulation and Hygiene: Supervision/safety;Cueing for sequencing;Cueing for compensatory techniques;Cueing for back precautions;Sit to/from stand       Functional mobility during ADLs: Supervision/safety;Rolling walker (2 wheels)       Vision         Perception     Praxis      Pertinent Vitals/Pain Pain Assessment Pain Assessment: Faces Faces Pain Scale: Hurts little more Pain Location:  back and R  thigh Pain Descriptors / Indicators: Grimacing, Guarding Pain Intervention(s): Monitored during session     Hand Dominance     Extremity/Trunk Assessment Upper Extremity Assessment Upper Extremity Assessment: Overall WFL for tasks assessed   Lower Extremity Assessment Lower Extremity Assessment: Defer to PT evaluation RLE Deficits / Details: Reports R thigh pain, but reports it has improved since surgery.   Cervical / Trunk Assessment Cervical / Trunk Assessment: Back Surgery   Communication Communication Communication: No difficulties   Cognition Arousal/Alertness: Awake/alert Behavior During Therapy: WFL for tasks assessed/performed Overall Cognitive Status: No family/caregiver present to determine baseline cognitive functioning                                 General Comments: Slow processing and difficulty with problem solving.  She requires cues for sequencing.  She demonstrates difficulty with novel tasks information, and demonstrates difficulty generalizing information     General Comments  She requires min cues to don/doff back brace .Pt reports she has good family support at home    Exercises     Shoulder [place] expects to be discharged to:: Private residence Living Arrangements: Alone Available Help at Discharge: Family;Available 24 hours/day (niece will help at d/c) Type of Home: House Home Access: Stairs to enter Entrance Stairs-Number of Steps: 1 Entrance Stairs-Rails: None (pulls up on doorframe) Home Layout: One level     Bathroom Shower/Tub: Occupational psychologist: Handicapped height     Home Equipment: Advice worker (2 wheels);Cane - single point;Grab bars - tub/shower          Prior Functioning/Environment Prior Level of Function : Independent/Modified Independent             Mobility Comments: Was using RW for mobility secondary to pain           OT Problem List: Decreased knowledge of use of DME or AE;Decreased knowledge of precautions;Pain      OT Treatment/Interventions:      OT Goals(Current goals can be found in the care plan section) Acute Rehab OT Goals Patient Stated Goal: to keep my back safe OT Goal Formulation: All assessment and education complete, DC therapy  OT Frequency:      Co-evaluation              AM-PAC OT "6 Clicks" Daily Activity     Outcome Measure Help from another person eating meals?: None Help from another person taking care of personal grooming?: A Little Help from another person toileting, which includes using toliet, bedpan, or urinal?: A Little Help from another person bathing (including washing, rinsing, drying)?: A Little Help from another person to put on and taking off regular upper body clothing?: A Little Help from another person to put on and taking off regular lower body clothing?: A Little 6 Click Score: 19   End of Session Equipment Utilized During Treatment: Rolling walker (2 wheels);Back brace Nurse Communication: Mobility status  Activity Tolerance: Patient tolerated treatment well Patient left: in bed;with call bell/phone within reach  OT Visit Diagnosis: Unsteadiness on feet (R26.81);Cognitive communication deficit (R41.841);Pain Pain - part of body:  (back)                Time: 0086-7619 OT Time Calculation (min): 23 min Charges:  OT General Charges $OT Visit: 1 Visit OT Evaluation $OT Eval Moderate Complexity: 1 Mod OT Treatments $Self  Care/Home Management : 8-22 mins  Nilsa Nutting., OTR/L Acute Rehabilitation Services Pager [phone] Office [place], [place] 03/20/2021, 9:34 AM

## 2021-03-21 DIAGNOSIS — E7800 Pure hypercholesterolemia, unspecified: Secondary | ICD-10-CM | POA: Diagnosis not present

## 2021-03-21 DIAGNOSIS — M4726 Other spondylosis with radiculopathy, lumbar region: Secondary | ICD-10-CM | POA: Diagnosis not present

## 2021-03-21 DIAGNOSIS — M797 Fibromyalgia: Secondary | ICD-10-CM | POA: Diagnosis not present

## 2021-03-21 DIAGNOSIS — I8500 Esophageal varices without bleeding: Secondary | ICD-10-CM | POA: Diagnosis not present

## 2021-03-21 DIAGNOSIS — Z7982 Long term (current) use of aspirin: Secondary | ICD-10-CM | POA: Diagnosis not present

## 2021-03-21 DIAGNOSIS — G894 Chronic pain syndrome: Secondary | ICD-10-CM | POA: Diagnosis not present

## 2021-03-21 DIAGNOSIS — F32A Depression, unspecified: Secondary | ICD-10-CM | POA: Diagnosis not present

## 2021-03-21 DIAGNOSIS — K5730 Diverticulosis of large intestine without perforation or abscess without bleeding: Secondary | ICD-10-CM | POA: Diagnosis not present

## 2021-03-21 DIAGNOSIS — Z9049 Acquired absence of other specified parts of digestive tract: Secondary | ICD-10-CM | POA: Diagnosis not present

## 2021-03-21 DIAGNOSIS — J309 Allergic rhinitis, unspecified: Secondary | ICD-10-CM | POA: Diagnosis not present

## 2021-03-21 DIAGNOSIS — F411 Generalized anxiety disorder: Secondary | ICD-10-CM | POA: Diagnosis not present

## 2021-03-21 DIAGNOSIS — K589 Irritable bowel syndrome without diarrhea: Secondary | ICD-10-CM | POA: Diagnosis not present

## 2021-03-21 DIAGNOSIS — E119 Type 2 diabetes mellitus without complications: Secondary | ICD-10-CM | POA: Diagnosis not present

## 2021-03-21 DIAGNOSIS — K219 Gastro-esophageal reflux disease without esophagitis: Secondary | ICD-10-CM | POA: Diagnosis not present

## 2021-03-21 DIAGNOSIS — Z9089 Acquired absence of other organs: Secondary | ICD-10-CM | POA: Diagnosis not present

## 2021-03-21 DIAGNOSIS — Z85038 Personal history of other malignant neoplasm of large intestine: Secondary | ICD-10-CM | POA: Diagnosis not present

## 2021-03-21 DIAGNOSIS — M48061 Spinal stenosis, lumbar region without neurogenic claudication: Secondary | ICD-10-CM | POA: Diagnosis not present

## 2021-03-21 DIAGNOSIS — M109 Gout, unspecified: Secondary | ICD-10-CM | POA: Diagnosis not present

## 2021-03-21 DIAGNOSIS — Z79899 Other long term (current) drug therapy: Secondary | ICD-10-CM | POA: Diagnosis not present

## 2021-03-21 DIAGNOSIS — Z981 Arthrodesis status: Secondary | ICD-10-CM | POA: Diagnosis not present

## 2021-03-21 DIAGNOSIS — K449 Diaphragmatic hernia without obstruction or gangrene: Secondary | ICD-10-CM | POA: Diagnosis not present

## 2021-03-21 DIAGNOSIS — Z4789 Encounter for other orthopedic aftercare: Secondary | ICD-10-CM | POA: Diagnosis not present

## 2021-03-21 DIAGNOSIS — F988 Other specified behavioral and emotional disorders with onset usually occurring in childhood and adolescence: Secondary | ICD-10-CM | POA: Diagnosis not present

## 2021-03-21 DIAGNOSIS — I10 Essential (primary) hypertension: Secondary | ICD-10-CM | POA: Diagnosis not present

## 2021-03-31 ENCOUNTER — Other Ambulatory Visit: Payer: Self-pay | Admitting: Physician Assistant

## 2021-03-31 DIAGNOSIS — M4726 Other spondylosis with radiculopathy, lumbar region: Secondary | ICD-10-CM | POA: Diagnosis not present

## 2021-03-31 DIAGNOSIS — I10 Essential (primary) hypertension: Secondary | ICD-10-CM | POA: Diagnosis not present

## 2021-03-31 DIAGNOSIS — M109 Gout, unspecified: Secondary | ICD-10-CM | POA: Diagnosis not present

## 2021-03-31 DIAGNOSIS — Z9049 Acquired absence of other specified parts of digestive tract: Secondary | ICD-10-CM | POA: Diagnosis not present

## 2021-03-31 DIAGNOSIS — G894 Chronic pain syndrome: Secondary | ICD-10-CM | POA: Diagnosis not present

## 2021-03-31 DIAGNOSIS — K219 Gastro-esophageal reflux disease without esophagitis: Secondary | ICD-10-CM | POA: Diagnosis not present

## 2021-03-31 DIAGNOSIS — Z85038 Personal history of other malignant neoplasm of large intestine: Secondary | ICD-10-CM | POA: Diagnosis not present

## 2021-03-31 DIAGNOSIS — F988 Other specified behavioral and emotional disorders with onset usually occurring in childhood and adolescence: Secondary | ICD-10-CM | POA: Diagnosis not present

## 2021-03-31 DIAGNOSIS — Z9089 Acquired absence of other organs: Secondary | ICD-10-CM | POA: Diagnosis not present

## 2021-03-31 DIAGNOSIS — K589 Irritable bowel syndrome without diarrhea: Secondary | ICD-10-CM | POA: Diagnosis not present

## 2021-03-31 DIAGNOSIS — E7800 Pure hypercholesterolemia, unspecified: Secondary | ICD-10-CM | POA: Diagnosis not present

## 2021-03-31 DIAGNOSIS — M797 Fibromyalgia: Secondary | ICD-10-CM | POA: Diagnosis not present

## 2021-03-31 DIAGNOSIS — J309 Allergic rhinitis, unspecified: Secondary | ICD-10-CM | POA: Diagnosis not present

## 2021-03-31 DIAGNOSIS — Z7982 Long term (current) use of aspirin: Secondary | ICD-10-CM | POA: Diagnosis not present

## 2021-03-31 DIAGNOSIS — Z981 Arthrodesis status: Secondary | ICD-10-CM | POA: Diagnosis not present

## 2021-03-31 DIAGNOSIS — K449 Diaphragmatic hernia without obstruction or gangrene: Secondary | ICD-10-CM | POA: Diagnosis not present

## 2021-03-31 DIAGNOSIS — F411 Generalized anxiety disorder: Secondary | ICD-10-CM | POA: Diagnosis not present

## 2021-03-31 DIAGNOSIS — Z4789 Encounter for other orthopedic aftercare: Secondary | ICD-10-CM | POA: Diagnosis not present

## 2021-03-31 DIAGNOSIS — M48061 Spinal stenosis, lumbar region without neurogenic claudication: Secondary | ICD-10-CM | POA: Diagnosis not present

## 2021-03-31 DIAGNOSIS — F32A Depression, unspecified: Secondary | ICD-10-CM | POA: Diagnosis not present

## 2021-03-31 DIAGNOSIS — Z79899 Other long term (current) drug therapy: Secondary | ICD-10-CM | POA: Diagnosis not present

## 2021-03-31 DIAGNOSIS — I8500 Esophageal varices without bleeding: Secondary | ICD-10-CM | POA: Diagnosis not present

## 2021-03-31 DIAGNOSIS — K5730 Diverticulosis of large intestine without perforation or abscess without bleeding: Secondary | ICD-10-CM | POA: Diagnosis not present

## 2021-03-31 DIAGNOSIS — E119 Type 2 diabetes mellitus without complications: Secondary | ICD-10-CM | POA: Diagnosis not present

## 2021-04-29 ENCOUNTER — Ambulatory Visit: Payer: PPO | Admitting: Physician Assistant

## 2021-05-04 DIAGNOSIS — M5416 Radiculopathy, lumbar region: Secondary | ICD-10-CM | POA: Diagnosis not present
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
## 2021-05-18 ENCOUNTER — Other Ambulatory Visit: Payer: Self-pay | Admitting: General Practice

## 2021-05-18 ENCOUNTER — Other Ambulatory Visit: Payer: Self-pay | Admitting: Family Medicine

## 2021-05-18 DIAGNOSIS — Z1231 Encounter for screening mammogram for malignant neoplasm of breast: Secondary | ICD-10-CM

## 2021-05-21 ENCOUNTER — Other Ambulatory Visit: Payer: Self-pay | Admitting: Physician Assistant

## 2021-05-31 ENCOUNTER — Ambulatory Visit: Payer: PPO | Admitting: Physician Assistant

## 2021-05-31 DIAGNOSIS — F32A Depression, unspecified: Secondary | ICD-10-CM

## 2021-05-31 DIAGNOSIS — F5101 Primary insomnia: Secondary | ICD-10-CM

## 2021-05-31 DIAGNOSIS — M170 Bilateral primary osteoarthritis of knee: Secondary | ICD-10-CM

## 2021-05-31 DIAGNOSIS — M5030 Other cervical disc degeneration, unspecified cervical region: Secondary | ICD-10-CM

## 2021-05-31 DIAGNOSIS — M19041 Primary osteoarthritis, right hand: Secondary | ICD-10-CM

## 2021-05-31 DIAGNOSIS — M1A09X0 Idiopathic chronic gout, multiple sites, without tophus (tophi): Secondary | ICD-10-CM

## 2021-05-31 DIAGNOSIS — M797 Fibromyalgia: Secondary | ICD-10-CM

## 2021-05-31 DIAGNOSIS — M5136 Other intervertebral disc degeneration, lumbar region: Secondary | ICD-10-CM

## 2021-05-31 DIAGNOSIS — Z8719 Personal history of other diseases of the digestive system: Secondary | ICD-10-CM

## 2021-05-31 DIAGNOSIS — G894 Chronic pain syndrome: Secondary | ICD-10-CM

## 2021-05-31 DIAGNOSIS — Z8679 Personal history of other diseases of the circulatory system: Secondary | ICD-10-CM

## 2021-05-31 DIAGNOSIS — R5383 Other fatigue: Secondary | ICD-10-CM

## 2021-05-31 DIAGNOSIS — M8589 Other specified disorders of bone density and structure, multiple sites: Secondary | ICD-10-CM

## 2021-06-03 ENCOUNTER — Ambulatory Visit: Payer: PPO | Admitting: Physician Assistant

## 2021-06-10 DIAGNOSIS — M25561 Pain in right knee: Secondary | ICD-10-CM | POA: Diagnosis not present

## 2021-06-23 ENCOUNTER — Encounter: Payer: Self-pay | Admitting: Rheumatology

## 2021-06-23 ENCOUNTER — Ambulatory Visit: Payer: PPO | Admitting: Rheumatology

## 2021-06-23 VITALS — BP 152/92 | HR 79 | Ht 65.5 in | Wt 180.8 lb

## 2021-06-23 DIAGNOSIS — G8929 Other chronic pain: Secondary | ICD-10-CM | POA: Diagnosis not present

## 2021-06-23 DIAGNOSIS — M797 Fibromyalgia: Secondary | ICD-10-CM | POA: Diagnosis not present

## 2021-06-23 DIAGNOSIS — F5101 Primary insomnia: Secondary | ICD-10-CM

## 2021-06-23 DIAGNOSIS — M19042 Primary osteoarthritis, left hand: Secondary | ICD-10-CM

## 2021-06-23 DIAGNOSIS — M19041 Primary osteoarthritis, right hand: Secondary | ICD-10-CM | POA: Diagnosis not present

## 2021-06-23 DIAGNOSIS — Z8679 Personal history of other diseases of the circulatory system: Secondary | ICD-10-CM

## 2021-06-23 DIAGNOSIS — R5383 Other fatigue: Secondary | ICD-10-CM

## 2021-06-23 DIAGNOSIS — M170 Bilateral primary osteoarthritis of knee: Secondary | ICD-10-CM | POA: Diagnosis not present

## 2021-06-23 DIAGNOSIS — M25561 Pain in right knee: Secondary | ICD-10-CM

## 2021-06-23 DIAGNOSIS — M5136 Other intervertebral disc degeneration, lumbar region: Secondary | ICD-10-CM

## 2021-06-23 DIAGNOSIS — G894 Chronic pain syndrome: Secondary | ICD-10-CM

## 2021-06-23 DIAGNOSIS — M5030 Other cervical disc degeneration, unspecified cervical region: Secondary | ICD-10-CM | POA: Diagnosis not present

## 2021-06-23 DIAGNOSIS — F419 Anxiety disorder, unspecified: Secondary | ICD-10-CM

## 2021-06-23 DIAGNOSIS — M25461 Effusion, right knee: Secondary | ICD-10-CM | POA: Diagnosis not present

## 2021-06-23 DIAGNOSIS — Z8719 Personal history of other diseases of the digestive system: Secondary | ICD-10-CM

## 2021-06-23 DIAGNOSIS — M25511 Pain in right shoulder: Secondary | ICD-10-CM

## 2021-06-23 DIAGNOSIS — F32A Depression, unspecified: Secondary | ICD-10-CM

## 2021-06-23 DIAGNOSIS — M1A09X0 Idiopathic chronic gout, multiple sites, without tophus (tophi): Secondary | ICD-10-CM

## 2021-06-23 DIAGNOSIS — Z5181 Encounter for therapeutic drug level monitoring: Secondary | ICD-10-CM | POA: Diagnosis not present

## 2021-06-23 DIAGNOSIS — M8589 Other specified disorders of bone density and structure, multiple sites: Secondary | ICD-10-CM

## 2021-06-23 MED ORDER — TRIAMCINOLONE ACETONIDE 40 MG/ML IJ SUSP
40.0000 mg | INTRAMUSCULAR | Status: AC | PRN
  Administered 2021-06-23: 40 mg via INTRA_ARTICULAR

## 2021-06-23 MED ORDER — LIDOCAINE HCL 1 % IJ SOLN
1.5000 mL | INTRAMUSCULAR | Status: AC | PRN
  Administered 2021-06-23: 1.5 mL
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
## 2021-06-23 NOTE — Patient Instructions (Signed)
Shoulder Exercises ?Ask your health care provider which exercises are safe for you. Do exercises exactly as told by your health care provider and adjust them as directed. It is normal to feel mild stretching, pulling, tightness, or discomfort as you do these exercises. Stop right away if you feel sudden pain or your pain gets worse. Do not begin these exercises until told by your health care provider. ?Stretching exercises ?External rotation and abduction ?This exercise is sometimes called corner stretch. This exercise rotates your arm outward (external rotation) and moves your arm out from your body (abduction). ?Stand in a doorway with one of your feet slightly in front of the other. This is called a staggered stance. If you cannot reach your forearms to the door frame, stand facing a corner of a room. ?Choose one of the following positions as told by your health care provider: ?Place your hands and forearms on the door frame above your head. ?Place your hands and forearms on the door frame at the height of your head. ?Place your hands on the door frame at the height of your elbows. ?Slowly move your weight onto your front foot until you feel a stretch across your chest and in the front of your shoulders. Keep your head and chest upright and keep your abdominal muscles tight. ?Hold for __________ seconds. ?To release the stretch, shift your weight to your back foot. ?Repeat __________ times. Complete this exercise __________ times a day. ?Extension, standing ?Stand and hold a broomstick, a cane, or a similar object behind your back. ?Your hands should be a little wider than shoulder width apart. ?Your palms should face away from your back. ?Keeping your elbows straight and your shoulder muscles relaxed, move the stick away from your body until you feel a stretch in your shoulders (extension). ?Avoid shrugging your shoulders while you move the stick. Keep your shoulder blades tucked down toward the middle of your  back. ?Hold for __________ seconds. ?Slowly return to the starting position. ?Repeat __________ times. Complete this exercise __________ times a day. ?Range-of-motion exercises ?Pendulum ? ?Stand near a wall or a surface that you can hold onto for balance. ?Bend at the waist and let your left / right arm hang straight down. Use your other arm to support you. Keep your back straight and do not lock your knees. ?Relax your left / right arm and shoulder muscles, and move your hips and your trunk so your left / right arm swings freely. Your arm should swing because of the motion of your body, not because you are using your arm or shoulder muscles. ?Keep moving your hips and trunk so your arm swings in the following directions, as told by your health care provider: ?Side to side. ?Forward and backward. ?In clockwise and counterclockwise circles. ?Continue each motion for __________ seconds, or for as long as told by your health care provider. ?Slowly return to the starting position. ?Repeat __________ times. Complete this exercise __________ times a day. ?Shoulder flexion, standing ? ?Stand and hold a broomstick, a cane, or a similar object. Place your hands a little more than shoulder width apart on the object. Your left / right hand should be palm up, and your other hand should be palm down. ?Keep your elbow straight and your shoulder muscles relaxed. Push the stick up with your healthy arm to raise your left / right arm in front of your body, and then over your head until you feel a stretch in your shoulder (flexion). ?Avoid   shrugging your shoulder while you raise your arm. Keep your shoulder blade tucked down toward the middle of your back. ?Hold for __________ seconds. ?Slowly return to the starting position. ?Repeat __________ times. Complete this exercise __________ times a day. ?Shoulder abduction, standing ?Stand and hold a broomstick, a cane, or a similar object. Place your hands a little more than shoulder  width apart on the object. Your left / right hand should be palm up, and your other hand should be palm down. ?Keep your elbow straight and your shoulder muscles relaxed. Push the object across your body toward your left / right side. Raise your left / right arm to the side of your body (abduction) until you feel a stretch in your shoulder. ?Do not raise your arm above shoulder height unless your health care provider tells you to do that. ?If directed, raise your arm over your head. ?Avoid shrugging your shoulder while you raise your arm. Keep your shoulder blade tucked down toward the middle of your back. ?Hold for __________ seconds. ?Slowly return to the starting position. ?Repeat __________ times. Complete this exercise __________ times a day. ?Internal rotation ? ?Place your left / right hand behind your back, palm up. ?Use your other hand to dangle an exercise band, a towel, or a similar object over your shoulder. Grasp the band with your left / right hand so you are holding on to both ends. ?Gently pull up on the band until you feel a stretch in the front of your left / right shoulder. The movement of your arm toward the center of your body is called internal rotation. ?Avoid shrugging your shoulder while you raise your arm. Keep your shoulder blade tucked down toward the middle of your back. ?Hold for __________ seconds. ?Release the stretch by letting go of the band and lowering your hands. ?Repeat __________ times. Complete this exercise __________ times a day. ?Strengthening exercises ?External rotation ? ?Sit in a stable chair without armrests. ?Secure an exercise band to a stable object at elbow height on your left / right side. ?Place a soft object, such as a folded towel or a small pillow, between your left / right upper arm and your body to move your elbow about 4 inches (10 cm) away from your side. ?Hold the end of the exercise band so it is tight and there is no slack. ?Keeping your elbow pressed  against the soft object, slowly move your forearm out, away from your abdomen (external rotation). Keep your body steady so only your forearm moves. ?Hold for __________ seconds. ?Slowly return to the starting position. ?Repeat __________ times. Complete this exercise __________ times a day. ?Shoulder abduction ? ?Sit in a stable chair without armrests, or stand up. ?Hold a __________ weight in your left / right hand, or hold an exercise band with both hands. ?Start with your arms straight down and your left / right palm facing in, toward your body. ?Slowly lift your left / right hand out to your side (abduction). Do not lift your hand above shoulder height unless your health care provider tells you that this is safe. ?Keep your arms straight. ?Avoid shrugging your shoulder while you do this movement. Keep your shoulder blade tucked down toward the middle of your back. ?Hold for __________ seconds. ?Slowly lower your arm, and return to the starting position. ?Repeat __________ times. Complete this exercise __________ times a day. ?Shoulder extension ?Sit in a stable chair without armrests, or stand up. ?Secure an exercise band   to a stable object in front of you so it is at shoulder height. ?Hold one end of the exercise band in each hand. Your palms should face each other. ?Straighten your elbows and lift your hands up to shoulder height. ?Step back, away from the secured end of the exercise band, until the band is tight and there is no slack. ?Squeeze your shoulder blades together as you pull your hands down to the sides of your thighs (extension). Stop when your hands are straight down by your sides. Do not let your hands go behind your body. ?Hold for __________ seconds. ?Slowly return to the starting position. ?Repeat __________ times. Complete this exercise __________ times a day. ?Shoulder row ?Sit in a stable chair without armrests, or stand up. ?Secure an exercise band to a stable object in front of you so it  is at waist height. ?Hold one end of the exercise band in each hand. Position your palms so that your thumbs are facing the ceiling (neutral position). ?Bend each of your elbows to a 90-degree angle (righ

## 2021-06-24 LAB — CBC WITH DIFFERENTIAL/PLATELET
Absolute Monocytes: 456 cells/uL (ref 200–950)
Basophils Absolute: 40 cells/uL (ref 0–200)
Basophils Relative: 0.6 %
Eosinophils Absolute: 295 cells/uL (ref 15–500)
Eosinophils Relative: 4.4 %
HCT: 35.8 % (ref 35.0–45.0)
Hemoglobin: 11.4 g/dL — ABNORMAL LOW (ref 11.7–15.5)
Lymphs Abs: 1849 cells/uL (ref 850–3900)
MCH: 29.5 pg (ref 27.0–33.0)
MCHC: 31.8 g/dL — ABNORMAL LOW (ref 32.0–36.0)
MCV: 92.7 fL (ref 80.0–100.0)
MPV: 9.6 fL (ref 7.5–12.5)
Monocytes Relative: 6.8 %
Neutro Abs: 4060 cells/uL (ref 1500–7800)
Neutrophils Relative %: 60.6 %
Platelets: 325 10*3/uL (ref 140–400)
RBC: 3.86 10*6/uL (ref 3.80–5.10)
RDW: 12.8 % (ref 11.0–15.0)
Total Lymphocyte: 27.6 %
WBC: 6.7 10*3/uL (ref 3.8–10.8)

## 2021-06-24 LAB — COMPLETE METABOLIC PANEL WITH GFR
AG Ratio: 1.6 (calc) (ref 1.0–2.5)
ALT: 9 U/L (ref 6–29)
AST: 10 U/L (ref 10–35)
Albumin: 3.9 g/dL (ref 3.6–5.1)
Alkaline phosphatase (APISO): 96 U/L (ref 37–153)
BUN: 18 mg/dL (ref 7–25)
CO2: 29 mmol/L (ref 20–32)
Calcium: 9.7 mg/dL (ref 8.6–10.4)
Chloride: 107 mmol/L (ref 98–110)
Creat: 0.78 mg/dL (ref 0.60–1.00)
Globulin: 2.4 g/dL (calc) (ref 1.9–3.7)
Glucose, Bld: 102 mg/dL — ABNORMAL HIGH (ref 65–99)
Potassium: 5.2 mmol/L (ref 3.5–5.3)
Sodium: 144 mmol/L (ref 135–146)
Total Bilirubin: 0.2 mg/dL (ref 0.2–1.2)
Total Protein: 6.3 g/dL (ref 6.1–8.1)
eGFR: 81 mL/min/{1.73_m2} (ref >=60)

## 2021-06-24 LAB — RHEUMATOID FACTOR: Rheumatoid fact SerPl-aCnc: <14 IU/mL (ref <14)

## 2021-06-24 LAB — URIC ACID: Uric Acid, Serum: 3.4 mg/dL (ref 2.5–7.0)

## 2021-06-24 LAB — CYCLIC CITRUL PEPTIDE ANTIBODY, IGG: Cyclic Citrullin Peptide Ab: <16 UNITS

## 2021-06-24 NOTE — Progress Notes (Signed)
Hemoglobin is low and stable.  CMP is normal.  Rheumatoid factor negative, uric acid normal.

## 2021-06-25 ENCOUNTER — Ambulatory Visit: Payer: PPO

## 2021-07-02 ENCOUNTER — Ambulatory Visit: Payer: PPO | Admitting: Physician Assistant

## 2021-07-05 ENCOUNTER — Ambulatory Visit: Payer: PPO

## 2021-07-06 DIAGNOSIS — M5416 Radiculopathy, lumbar region: Secondary | ICD-10-CM | POA: Diagnosis not present

## 2021-07-07 ENCOUNTER — Ambulatory Visit: Payer: PPO

## 2021-07-12 ENCOUNTER — Other Ambulatory Visit: Payer: Self-pay

## 2021-07-12 ENCOUNTER — Ambulatory Visit
Admission: RE | Admit: 2021-07-12 | Discharge: 2021-07-12 | Disposition: A | Discharge status: Home / Self Care | Payer: PPO | Source: Ambulatory Visit | Attending: Family Medicine | Admitting: Family Medicine

## 2021-07-12 DIAGNOSIS — Z1231 Encounter for screening mammogram for malignant neoplasm of breast: Secondary | ICD-10-CM

## 2021-07-12 MED ORDER — GABAPENTIN 300 MG PO CAPS: 600.0000 mg | ORAL_CAPSULE | Freq: Three times a day (TID) | ORAL | 1 refills | Status: DC

## 2021-07-12 NOTE — Progress Notes (Signed)
Rx refilled to appointment date. ?

## 2021-07-23 ENCOUNTER — Encounter: Payer: Self-pay | Admitting: Physician Assistant

## 2021-07-23 ENCOUNTER — Ambulatory Visit (INDEPENDENT_AMBULATORY_CARE_PROVIDER_SITE_OTHER): Payer: PPO | Admitting: Physician Assistant

## 2021-07-23 DIAGNOSIS — R5383 Other fatigue: Secondary | ICD-10-CM

## 2021-07-23 DIAGNOSIS — F3181 Bipolar II disorder: Secondary | ICD-10-CM

## 2021-07-23 MED ORDER — BUPROPION HCL ER (XL) 300 MG PO TB24: 300.0000 mg | ORAL_TABLET | Freq: Every morning | ORAL | 1 refills | Status: DC

## 2021-07-23 MED ORDER — QUETIAPINE FUMARATE 400 MG PO TABS: 400.0000 mg | ORAL_TABLET | Freq: Every day | ORAL | 1 refills | Status: DC

## 2021-07-23 MED ORDER — DULOXETINE HCL 60 MG PO CPEP: 120.0000 mg | ORAL_CAPSULE | Freq: Every day | ORAL | 1 refills | Status: DC

## 2021-07-23 NOTE — Progress Notes (Unsigned)
[facility]  Patient ID: Tammy Mullins,  MRN: [id number]  PCP: Maury Dus, MD  Date of Evaluation: 07/23/2021 Time spent:30 minutes  Chief Complaint:  Chief Complaint   Depression; Anxiety; Follow-up     HISTORY/CURRENT STATUS:  HPI For routine med check.   Has been more depressed for a few months. Doesn't want to talk to people, wants to stay to herself, doesn't cry easily. Energy and motivation are fair. She has started getting out with friends, which she knows is good for her. She thinks part of the depression is b/c of her physical health. Personal hygiene is nl, ADLs depends on the day but partly d/t healing from back surgery done in January. Appetite is nl and weight is stable. Not having a lot of anxiety. Sleeps ok. No SI/HI.   Patient denies increased energy with decreased need for sleep, no increased talkativeness, no racing thoughts, no impulsivity or risky behaviors, no increased spending, no increased libido, no grandiosity, no increased irritability or anger, no paranoia, and no hallucinations.  Denies dizziness, syncope, seizures, numbness, tingling, tremor, tics, unsteady gait, slurred speech, confusion. Has chronic back pain, better after surgery.   Individual Medical History/ Review of Systems: Changes? :Yes    sinus issues, seasonal allergies. Had lumbar fusion in January.   Past medications for mental health diagnoses include: Cymbalta, Seroquel, Gabapentin, Wellbutrin  Allergies: Crestor [rosuvastatin calcium], Asa [aspirin], Topiramate, and Celebrex [celecoxib]  Current Medications:  Current Outpatient Medications:    allopurinol (ZYLOPRIM) 300 MG tablet, TAKE 1 AND 1/2 TABLETS(450 MG) BY MOUTH DAILY, Disp: 45 tablet, Rfl: 2   amLODipine (NORVASC) 10 MG tablet, TAKE 1 TABLET DAILY, Disp: 90 tablet, Rfl: 1   aspirin 81 MG tablet, Take 81 mg by mouth daily., Disp: , Rfl:    B Complex-C (B-COMPLEX WITH VITAMIN C) tablet, Take 1 tablet by mouth  daily., Disp: , Rfl:    buPROPion (WELLBUTRIN XL) 300 MG 24 hr tablet, Take 1 tablet (300 mg total) by mouth in the morning., Disp: 30 tablet, Rfl: 1   butalbital-acetaminophen-caffeine (FIORICET) 50-325-40 MG tablet, Take 1 tablet by mouth every 6 (six) hours as needed for migraine., Disp: 10 tablet, Rfl: 3   CALCIUM-MAGNESIUM-ZINC PO, Take 1 tablet by mouth daily., Disp: , Rfl:    Cholecalciferol (VITAMIN D) 2000 UNITS CAPS, Take 2,000 Units by mouth daily. , Disp: , Rfl:    famotidine (PEPCID) 40 MG tablet, Take 40 mg by mouth daily., Disp: , Rfl:    ferrous sulfate 325 (65 FE) MG tablet, Take 1 tablet (325 mg total) by mouth 2 (two) times daily with a meal. (Patient taking differently: Take 325 mg by mouth daily with breakfast.), Disp: , Rfl: 3   fluticasone (FLONASE) 50 MCG/ACT nasal spray, Place 1 spray into both nostrils as needed for allergies or rhinitis., Disp: , Rfl:    gabapentin (NEURONTIN) 300 MG capsule, Take 2 capsules (600 mg total) by mouth 3 (three) times daily., Disp: 180 capsule, Rfl: 1   Garlic 8841 MG CAPS, Take 1,000 mg by mouth daily., Disp: , Rfl:    hydrochlorothiazide (HYDRODIURIL) 25 MG tablet, Take 25 mg by mouth daily., Disp: , Rfl:    hyoscyamine (LEVSIN) 0.125 MG tablet, Take 0.125 mg by mouth every 4 (four) hours as needed for cramping., Disp: , Rfl:    losartan (COZAAR) 100 MG tablet, Take 1 tablet (100 mg total) by mouth daily., Disp: 30 tablet, Rfl: 5   methocarbamol (ROBAXIN) 750 MG tablet, Take  750 mg by mouth every 6 (six) hours as needed for muscle spasms., Disp: , Rfl:    metoprolol succinate (TOPROL-XL) 100 MG 24 hr tablet, TAKE 1 TABLET DAILY, Disp: 90 tablet, Rfl: 1   omeprazole (PRILOSEC) 40 MG capsule, Take 40 mg by mouth daily., Disp: , Rfl:    oxyCODONE 10 MG TABS, Take 1 tablet (10 mg total) by mouth every 4 (four) hours as needed for severe pain ((score 7 to 10))., Disp: 30 tablet, Rfl: 0   simethicone (MYLICON) 284 MG chewable tablet, Chew 125 mg  by mouth every 6 (six) hours as needed for flatulence., Disp: , Rfl:    zonisamide (ZONEGRAN) 25 MG capsule, Take 3 capsules (75 mg total) by mouth at bedtime., Disp: 90 capsule, Rfl: 6   DULoxetine (CYMBALTA) 60 MG capsule, Take 2 capsules (120 mg total) by mouth daily., Disp: 180 capsule, Rfl: 1   QUEtiapine (SEROQUEL) 400 MG tablet, Take 1 tablet (400 mg total) by mouth at bedtime., Disp: 90 tablet, Rfl: 1 Medication Side Effects: none  Family Medical/ Social History: Changes?  No  MENTAL HEALTH EXAM:  There were no vitals taken for this visit.There is no height or weight on file to calculate BMI.  General Appearance: Casual and Well Groomed  Eye Contact:  Good  Speech:  Clear and Coherent and Normal Rate  Volume:  Normal  Mood:  Depressed  Affect:   sad  Thought Process:  Goal Directed and Descriptions of Associations: Intact  Orientation:  Full (Time, Place, and Person)  Thought Content: Logical   Suicidal Thoughts:  No  Homicidal Thoughts:  No  Memory:  WNL  Judgement:  Good  Insight:  Good  Psychomotor Activity:  Normal  Concentration:  Concentration: Good  Recall:  Good  Fund of Knowledge: Good  Language: Good  Assets:  Desire for Improvement  ADL's:  Intact  Cognition: WNL  Prognosis:  Good   Labs  03/03/2021 through [facility] hemoglobin A1c 6.6 Lipid panel LDL 114, total cholesterol 182, HDL 51, triglycerides 91  06/23/2021 CBC hemoglobin 11.4 CMP glucose 102 all other values completely normal  DIAGNOSES:    ICD-10-CM   1. Bipolar 2 disorder (HCC)  F31.81     2. Fatigue, unspecified type  R53.83       Receiving Psychotherapy: No    RECOMMENDATIONS:  PDMP was reviewed.  No mental health controlled substances.  Was prescribed oxycodone per surgeon noted. I provided 30 minutes of face to face time during this encounter, including time spent before and after the visit in records review, medical decision making, counseling  pertinent to today's visit, and charting.  We discussed the increased depression.  I recommend increasing Wellbutrin.  She would like to try it.  Increase Wellbutrin XL to 300 mg, 1 p.o. every morning. Continue Cymbalta 60 mg, 2 p.o. daily. Continue Seroquel 400 mg, 1 p.o. nightly. Continue gabapentin 300 mg, 2 p.o. 3 times daily. (For pain Continue B complex, vitamin D, multivitamin, and fish oil. Return in 6 weeks  Donnal Moat, PA-C
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
## 2021-08-19 ENCOUNTER — Ambulatory Visit: Payer: PPO | Admitting: Neurology
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
## 2021-09-02 ENCOUNTER — Encounter: Payer: Self-pay | Admitting: Neurology

## 2021-09-02 ENCOUNTER — Ambulatory Visit: Payer: PPO | Admitting: Neurology

## 2021-09-02 VITALS — BP 129/74 | HR 96 | Ht 65.0 in | Wt 172.0 lb

## 2021-09-02 DIAGNOSIS — G43719 Chronic migraine without aura, intractable, without status migrainosus: Secondary | ICD-10-CM

## 2021-09-02 MED ORDER — ZONISAMIDE 25 MG PO CAPS: 75.0000 mg | ORAL_CAPSULE | Freq: Every day | ORAL | 3 refills | Status: DC

## 2021-09-06 ENCOUNTER — Ambulatory Visit: Payer: PPO | Admitting: Physician Assistant
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
## 2021-09-22 ENCOUNTER — Ambulatory Visit: Payer: PPO | Admitting: Physician Assistant

## 2021-09-22 DIAGNOSIS — G8929 Other chronic pain: Secondary | ICD-10-CM

## 2021-09-22 DIAGNOSIS — M19041 Primary osteoarthritis, right hand: Secondary | ICD-10-CM

## 2021-09-22 DIAGNOSIS — M170 Bilateral primary osteoarthritis of knee: Secondary | ICD-10-CM

## 2021-09-22 DIAGNOSIS — Z8679 Personal history of other diseases of the circulatory system: Secondary | ICD-10-CM

## 2021-09-22 DIAGNOSIS — M5136 Other intervertebral disc degeneration, lumbar region: Secondary | ICD-10-CM

## 2021-09-22 DIAGNOSIS — M797 Fibromyalgia: Secondary | ICD-10-CM

## 2021-09-22 DIAGNOSIS — R5383 Other fatigue: Secondary | ICD-10-CM

## 2021-09-22 DIAGNOSIS — Z5181 Encounter for therapeutic drug level monitoring: Secondary | ICD-10-CM

## 2021-09-22 DIAGNOSIS — F5101 Primary insomnia: Secondary | ICD-10-CM

## 2021-09-22 DIAGNOSIS — G894 Chronic pain syndrome: Secondary | ICD-10-CM

## 2021-09-22 DIAGNOSIS — M1A09X0 Idiopathic chronic gout, multiple sites, without tophus (tophi): Secondary | ICD-10-CM

## 2021-09-22 DIAGNOSIS — M8589 Other specified disorders of bone density and structure, multiple sites: Secondary | ICD-10-CM

## 2021-09-22 DIAGNOSIS — M5030 Other cervical disc degeneration, unspecified cervical region: Secondary | ICD-10-CM

## 2021-09-22 DIAGNOSIS — M25461 Effusion, right knee: Secondary | ICD-10-CM

## 2021-09-22 DIAGNOSIS — F419 Anxiety disorder, unspecified: Secondary | ICD-10-CM

## 2021-09-22 DIAGNOSIS — Z8719 Personal history of other diseases of the digestive system: Secondary | ICD-10-CM

## 2021-09-28 ENCOUNTER — Encounter: Payer: Self-pay | Admitting: Physician Assistant

## 2021-09-28 ENCOUNTER — Ambulatory Visit: Payer: PPO | Attending: Physician Assistant | Admitting: Physician Assistant

## 2021-09-28 VITALS — BP 150/91 | HR 86 | Resp 16 | Ht 65.0 in | Wt 186.0 lb

## 2021-09-28 DIAGNOSIS — M5136 Other intervertebral disc degeneration, lumbar region: Secondary | ICD-10-CM

## 2021-09-28 DIAGNOSIS — M25461 Effusion, right knee: Secondary | ICD-10-CM

## 2021-09-28 DIAGNOSIS — Z8679 Personal history of other diseases of the circulatory system: Secondary | ICD-10-CM

## 2021-09-28 DIAGNOSIS — M170 Bilateral primary osteoarthritis of knee: Secondary | ICD-10-CM

## 2021-09-28 DIAGNOSIS — F419 Anxiety disorder, unspecified: Secondary | ICD-10-CM

## 2021-09-28 DIAGNOSIS — M1A09X0 Idiopathic chronic gout, multiple sites, without tophus (tophi): Secondary | ICD-10-CM | POA: Diagnosis not present

## 2021-09-28 DIAGNOSIS — G8929 Other chronic pain: Secondary | ICD-10-CM

## 2021-09-28 DIAGNOSIS — M8589 Other specified disorders of bone density and structure, multiple sites: Secondary | ICD-10-CM

## 2021-09-28 DIAGNOSIS — G894 Chronic pain syndrome: Secondary | ICD-10-CM

## 2021-09-28 DIAGNOSIS — R5383 Other fatigue: Secondary | ICD-10-CM

## 2021-09-28 DIAGNOSIS — M797 Fibromyalgia: Secondary | ICD-10-CM

## 2021-09-28 DIAGNOSIS — M19042 Primary osteoarthritis, left hand: Secondary | ICD-10-CM

## 2021-09-28 DIAGNOSIS — F5101 Primary insomnia: Secondary | ICD-10-CM

## 2021-09-28 DIAGNOSIS — M51369 Other intervertebral disc degeneration, lumbar region without mention of lumbar back pain or lower extremity pain: Secondary | ICD-10-CM

## 2021-09-28 DIAGNOSIS — M25511 Pain in right shoulder: Secondary | ICD-10-CM

## 2021-09-28 DIAGNOSIS — M5030 Other cervical disc degeneration, unspecified cervical region: Secondary | ICD-10-CM | POA: Diagnosis not present

## 2021-09-28 DIAGNOSIS — M19041 Primary osteoarthritis, right hand: Secondary | ICD-10-CM | POA: Diagnosis not present

## 2021-09-28 DIAGNOSIS — M25561 Pain in right knee: Secondary | ICD-10-CM

## 2021-09-28 DIAGNOSIS — Z5181 Encounter for therapeutic drug level monitoring: Secondary | ICD-10-CM

## 2021-09-28 DIAGNOSIS — F32A Depression, unspecified: Secondary | ICD-10-CM

## 2021-09-28 DIAGNOSIS — Z8719 Personal history of other diseases of the digestive system: Secondary | ICD-10-CM

## 2021-09-28 NOTE — Patient Instructions (Signed)

## 2021-09-29 LAB — COMPLETE METABOLIC PANEL WITH GFR
AG Ratio: 1.7 (calc) (ref 1.0–2.5)
ALT: 9 U/L (ref 6–29)
AST: 11 U/L (ref 10–35)
Albumin: 4 g/dL (ref 3.6–5.1)
Alkaline phosphatase (APISO): 97 U/L (ref 37–153)
BUN: 14 mg/dL (ref 7–25)
CO2: 30 mmol/L (ref 20–32)
Calcium: 9.6 mg/dL (ref 8.6–10.4)
Chloride: 106 mmol/L (ref 98–110)
Creat: 0.98 mg/dL (ref 0.60–1.00)
Globulin: 2.4 g/dL (calc) (ref 1.9–3.7)
Glucose, Bld: 113 mg/dL — ABNORMAL HIGH (ref 65–99)
Potassium: 4.2 mmol/L (ref 3.5–5.3)
Sodium: 142 mmol/L (ref 135–146)
Total Bilirubin: 0.3 mg/dL (ref 0.2–1.2)
Total Protein: 6.4 g/dL (ref 6.1–8.1)
eGFR: 61 mL/min/{1.73_m2} (ref >=60)

## 2021-09-29 LAB — CBC WITH DIFFERENTIAL/PLATELET
Absolute Monocytes: 435 cells/uL (ref 200–950)
Basophils Absolute: 50 cells/uL (ref 0–200)
Basophils Relative: 0.8 %
Eosinophils Absolute: 233 cells/uL (ref 15–500)
Eosinophils Relative: 3.7 %
HCT: 36.3 % (ref 35.0–45.0)
Hemoglobin: 11.7 g/dL (ref 11.7–15.5)
Lymphs Abs: 1632 cells/uL (ref 850–3900)
MCH: 29.8 pg (ref 27.0–33.0)
MCHC: 32.2 g/dL (ref 32.0–36.0)
MCV: 92.6 fL (ref 80.0–100.0)
MPV: 9.3 fL (ref 7.5–12.5)
Monocytes Relative: 6.9 %
Neutro Abs: 3950 cells/uL (ref 1500–7800)
Neutrophils Relative %: 62.7 %
Platelets: 305 10*3/uL (ref 140–400)
RBC: 3.92 10*6/uL (ref 3.80–5.10)
RDW: 13.8 % (ref 11.0–15.0)
Total Lymphocyte: 25.9 %
WBC: 6.3 10*3/uL (ref 3.8–10.8)

## 2021-09-29 LAB — URIC ACID: Uric Acid, Serum: 4.7 mg/dL (ref 2.5–7.0)

## 2021-09-29 NOTE — Progress Notes (Signed)
Glucose is 113. Rest of CMP WNL.  CBC WNL.  Uric acid WNL.

## 2021-10-01 ENCOUNTER — Ambulatory Visit (INDEPENDENT_AMBULATORY_CARE_PROVIDER_SITE_OTHER): Payer: PPO | Admitting: Physician Assistant

## 2021-10-01 ENCOUNTER — Encounter: Payer: Self-pay | Admitting: Physician Assistant

## 2021-10-01 DIAGNOSIS — F99 Mental disorder, not otherwise specified: Secondary | ICD-10-CM | POA: Diagnosis not present

## 2021-10-01 DIAGNOSIS — G894 Chronic pain syndrome: Secondary | ICD-10-CM

## 2021-10-01 DIAGNOSIS — F5105 Insomnia due to other mental disorder: Secondary | ICD-10-CM

## 2021-10-01 DIAGNOSIS — F3181 Bipolar II disorder: Secondary | ICD-10-CM | POA: Diagnosis not present

## 2021-10-01 MED ORDER — BUPROPION HCL ER (XL) 300 MG PO TB24: 300.0000 mg | ORAL_TABLET | Freq: Every morning | ORAL | 1 refills | Status: DC
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
## 2021-10-19 DIAGNOSIS — K589 Irritable bowel syndrome without diarrhea: Secondary | ICD-10-CM | POA: Diagnosis not present

## 2021-10-19 DIAGNOSIS — K219 Gastro-esophageal reflux disease without esophagitis: Secondary | ICD-10-CM | POA: Diagnosis not present

## 2021-10-19 DIAGNOSIS — I10 Essential (primary) hypertension: Secondary | ICD-10-CM | POA: Diagnosis not present

## 2021-10-19 DIAGNOSIS — E2839 Other primary ovarian failure: Secondary | ICD-10-CM | POA: Diagnosis not present

## 2021-10-19 DIAGNOSIS — L309 Dermatitis, unspecified: Secondary | ICD-10-CM | POA: Diagnosis not present

## 2021-10-19 DIAGNOSIS — F324 Major depressive disorder, single episode, in partial remission: Secondary | ICD-10-CM | POA: Diagnosis not present

## 2021-10-19 DIAGNOSIS — E1169 Type 2 diabetes mellitus with other specified complication: Secondary | ICD-10-CM | POA: Diagnosis not present

## 2021-10-19 DIAGNOSIS — G44209 Tension-type headache, unspecified, not intractable: Secondary | ICD-10-CM | POA: Diagnosis not present

## 2021-10-19 DIAGNOSIS — M8580 Other specified disorders of bone density and structure, unspecified site: Secondary | ICD-10-CM | POA: Diagnosis not present

## 2021-10-19 DIAGNOSIS — M109 Gout, unspecified: Secondary | ICD-10-CM | POA: Diagnosis not present

## 2021-10-19 DIAGNOSIS — D509 Iron deficiency anemia, unspecified: Secondary | ICD-10-CM | POA: Diagnosis not present

## 2021-10-19 DIAGNOSIS — E7800 Pure hypercholesterolemia, unspecified: Secondary | ICD-10-CM | POA: Diagnosis not present

## 2021-10-20 ENCOUNTER — Other Ambulatory Visit: Payer: Self-pay | Admitting: Family Medicine

## 2021-10-20 DIAGNOSIS — E2839 Other primary ovarian failure: Secondary | ICD-10-CM

## 2021-11-02 DIAGNOSIS — M5416 Radiculopathy, lumbar region: Secondary | ICD-10-CM | POA: Diagnosis not present

## 2021-11-02 DIAGNOSIS — Z6829 Body mass index (BMI) 29.0-29.9, adult: Secondary | ICD-10-CM | POA: Diagnosis not present

## 2021-12-23 DIAGNOSIS — D649 Anemia, unspecified: Secondary | ICD-10-CM | POA: Diagnosis not present

## 2022-02-17 DIAGNOSIS — M47816 Spondylosis without myelopathy or radiculopathy, lumbar region: Secondary | ICD-10-CM | POA: Diagnosis not present

## 2022-02-17 DIAGNOSIS — M5416 Radiculopathy, lumbar region: Secondary | ICD-10-CM | POA: Diagnosis not present

## 2022-02-17 DIAGNOSIS — Z6830 Body mass index (BMI) 30.0-30.9, adult: Secondary | ICD-10-CM | POA: Diagnosis not present

## 2022-03-01 ENCOUNTER — Other Ambulatory Visit: Payer: Self-pay | Admitting: Student

## 2022-03-01 DIAGNOSIS — M5416 Radiculopathy, lumbar region: Secondary | ICD-10-CM

## 2022-03-03 ENCOUNTER — Ambulatory Visit
Admission: RE | Admit: 2022-03-03 | Discharge: 2022-03-03 | Disposition: A | Discharge status: Home / Self Care | Payer: PPO | Source: Ambulatory Visit | Attending: Student | Admitting: Student

## 2022-03-03 DIAGNOSIS — M5416 Radiculopathy, lumbar region: Secondary | ICD-10-CM

## 2022-03-03 DIAGNOSIS — M545 Low back pain, unspecified: Secondary | ICD-10-CM | POA: Diagnosis not present

## 2022-03-04 ENCOUNTER — Other Ambulatory Visit: Payer: Self-pay | Admitting: Physician Assistant

## 2022-03-15 DIAGNOSIS — M47816 Spondylosis without myelopathy or radiculopathy, lumbar region: Secondary | ICD-10-CM | POA: Diagnosis not present
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
## 2022-03-29 DIAGNOSIS — M4316 Spondylolisthesis, lumbar region: Secondary | ICD-10-CM | POA: Diagnosis not present

## 2022-03-29 DIAGNOSIS — R519 Headache, unspecified: Secondary | ICD-10-CM | POA: Diagnosis not present

## 2022-03-31 ENCOUNTER — Ambulatory Visit: Payer: PPO | Attending: Rheumatology | Admitting: Rheumatology

## 2022-03-31 ENCOUNTER — Encounter: Payer: Self-pay | Admitting: Rheumatology

## 2022-03-31 VITALS — BP 150/84 | HR 65 | Ht 65.5 in | Wt 180.0 lb

## 2022-03-31 DIAGNOSIS — G894 Chronic pain syndrome: Secondary | ICD-10-CM

## 2022-03-31 DIAGNOSIS — R5383 Other fatigue: Secondary | ICD-10-CM

## 2022-03-31 DIAGNOSIS — M170 Bilateral primary osteoarthritis of knee: Secondary | ICD-10-CM

## 2022-03-31 DIAGNOSIS — M8589 Other specified disorders of bone density and structure, multiple sites: Secondary | ICD-10-CM

## 2022-03-31 DIAGNOSIS — M25511 Pain in right shoulder: Secondary | ICD-10-CM

## 2022-03-31 DIAGNOSIS — M19041 Primary osteoarthritis, right hand: Secondary | ICD-10-CM | POA: Diagnosis not present

## 2022-03-31 DIAGNOSIS — S6991XA Unspecified injury of right wrist, hand and finger(s), initial encounter: Secondary | ICD-10-CM

## 2022-03-31 DIAGNOSIS — F5101 Primary insomnia: Secondary | ICD-10-CM | POA: Diagnosis not present

## 2022-03-31 DIAGNOSIS — M5030 Other cervical disc degeneration, unspecified cervical region: Secondary | ICD-10-CM | POA: Diagnosis not present

## 2022-03-31 DIAGNOSIS — M4316 Spondylolisthesis, lumbar region: Secondary | ICD-10-CM | POA: Diagnosis not present

## 2022-03-31 DIAGNOSIS — Z5181 Encounter for therapeutic drug level monitoring: Secondary | ICD-10-CM

## 2022-03-31 DIAGNOSIS — G8929 Other chronic pain: Secondary | ICD-10-CM

## 2022-03-31 DIAGNOSIS — Z8679 Personal history of other diseases of the circulatory system: Secondary | ICD-10-CM

## 2022-03-31 DIAGNOSIS — M797 Fibromyalgia: Secondary | ICD-10-CM | POA: Diagnosis not present

## 2022-03-31 DIAGNOSIS — Z8719 Personal history of other diseases of the digestive system: Secondary | ICD-10-CM

## 2022-03-31 DIAGNOSIS — M1A09X0 Idiopathic chronic gout, multiple sites, without tophus (tophi): Secondary | ICD-10-CM

## 2022-03-31 DIAGNOSIS — M5136 Other intervertebral disc degeneration, lumbar region: Secondary | ICD-10-CM | POA: Diagnosis not present

## 2022-03-31 DIAGNOSIS — R519 Headache, unspecified: Secondary | ICD-10-CM | POA: Diagnosis not present

## 2022-03-31 DIAGNOSIS — F32A Depression, unspecified: Secondary | ICD-10-CM

## 2022-03-31 DIAGNOSIS — M19042 Primary osteoarthritis, left hand: Secondary | ICD-10-CM

## 2022-03-31 DIAGNOSIS — F419 Anxiety disorder, unspecified: Secondary | ICD-10-CM

## 2022-03-31 DIAGNOSIS — M25461 Effusion, right knee: Secondary | ICD-10-CM

## 2022-03-31 MED ORDER — ALLOPURINOL 300 MG PO TABS: ORAL_TABLET | ORAL | 2 refills | Status: DC

## 2022-04-01 LAB — COMPLETE METABOLIC PANEL WITH GFR
AG Ratio: 1.5 (calc) (ref 1.0–2.5)
ALT: 7 U/L (ref 6–29)
AST: 10 U/L (ref 10–35)
Albumin: 3.9 g/dL (ref 3.6–5.1)
Alkaline phosphatase (APISO): 85 U/L (ref 37–153)
BUN: 14 mg/dL (ref 7–25)
CO2: 29 mmol/L (ref 20–32)
Calcium: 9.7 mg/dL (ref 8.6–10.4)
Chloride: 105 mmol/L (ref 98–110)
Creat: 0.82 mg/dL (ref 0.60–1.00)
Globulin: 2.6 g/dL (calc) (ref 1.9–3.7)
Glucose, Bld: 107 mg/dL — ABNORMAL HIGH (ref 65–99)
Potassium: 4 mmol/L (ref 3.5–5.3)
Sodium: 141 mmol/L (ref 135–146)
Total Bilirubin: 0.2 mg/dL (ref 0.2–1.2)
Total Protein: 6.5 g/dL (ref 6.1–8.1)
eGFR: 75 mL/min/{1.73_m2} (ref >=60)

## 2022-04-01 LAB — CBC WITH DIFFERENTIAL/PLATELET
Absolute Monocytes: 435 cells/uL (ref 200–950)
Basophils Absolute: 62 cells/uL (ref 0–200)
Basophils Relative: 0.9 %
Eosinophils Absolute: 248 cells/uL (ref 15–500)
Eosinophils Relative: 3.6 %
HCT: 30.9 % — ABNORMAL LOW (ref 35.0–45.0)
Hemoglobin: 9.7 g/dL — ABNORMAL LOW (ref 11.7–15.5)
Lymphs Abs: 2187 cells/uL (ref 850–3900)
MCH: 26.9 pg — ABNORMAL LOW (ref 27.0–33.0)
MCHC: 31.4 g/dL — ABNORMAL LOW (ref 32.0–36.0)
MCV: 85.6 fL (ref 80.0–100.0)
MPV: 10.1 fL (ref 7.5–12.5)
Monocytes Relative: 6.3 %
Neutro Abs: 3968 cells/uL (ref 1500–7800)
Neutrophils Relative %: 57.5 %
Platelets: 319 10*3/uL (ref 140–400)
RBC: 3.61 10*6/uL — ABNORMAL LOW (ref 3.80–5.10)
RDW: 13.2 % (ref 11.0–15.0)
Total Lymphocyte: 31.7 %
WBC: 6.9 10*3/uL (ref 3.8–10.8)

## 2022-04-01 LAB — URIC ACID: Uric Acid, Serum: 5.7 mg/dL (ref 2.5–7.0)

## 2022-04-01 NOTE — Progress Notes (Signed)
Hemoglobin is low at 9.7, CMP is normal, uric acid is in desirable range.  Please have patient see her PCP for low hemoglobin.  Please forward results to her PCP.

## 2022-04-06 ENCOUNTER — Ambulatory Visit: Payer: PPO | Admitting: Physician Assistant

## 2022-04-06 ENCOUNTER — Telehealth: Payer: PPO | Admitting: Physician Assistant

## 2022-04-08 ENCOUNTER — Ambulatory Visit: Payer: PPO | Admitting: Physician Assistant

## 2022-04-11 DIAGNOSIS — I10 Essential (primary) hypertension: Secondary | ICD-10-CM | POA: Diagnosis not present

## 2022-04-11 DIAGNOSIS — M549 Dorsalgia, unspecified: Secondary | ICD-10-CM | POA: Diagnosis not present

## 2022-04-11 DIAGNOSIS — Z8719 Personal history of other diseases of the digestive system: Secondary | ICD-10-CM | POA: Diagnosis not present

## 2022-04-11 DIAGNOSIS — R799 Abnormal finding of blood chemistry, unspecified: Secondary | ICD-10-CM | POA: Diagnosis not present

## 2022-04-13 ENCOUNTER — Encounter: Payer: Self-pay | Admitting: Physician Assistant

## 2022-04-13 ENCOUNTER — Ambulatory Visit (INDEPENDENT_AMBULATORY_CARE_PROVIDER_SITE_OTHER): Payer: PPO | Admitting: Physician Assistant

## 2022-04-13 DIAGNOSIS — F5105 Insomnia due to other mental disorder: Secondary | ICD-10-CM | POA: Diagnosis not present

## 2022-04-13 DIAGNOSIS — F3181 Bipolar II disorder: Secondary | ICD-10-CM

## 2022-04-13 DIAGNOSIS — G894 Chronic pain syndrome: Secondary | ICD-10-CM

## 2022-04-13 DIAGNOSIS — F99 Mental disorder, not otherwise specified: Secondary | ICD-10-CM

## 2022-04-13 NOTE — Progress Notes (Signed)
[facility] Med Check  Patient ID: Tammy Mullins,  MRN: [id number]  PCP: Maury Dus, MD (Inactive)  Date of Evaluation:04/13/2022 Time spent:25 minutes  Chief Complaint:  Chief Complaint   Anxiety; Depression; Follow-up   Virtual Visit via Telehealth  I connected with patient by telephone, with their informed consent, and verified patient privacy and that I am speaking with the correct person using two identifiers.  I am private, in my office and the patient is at home.  I discussed the limitations, risks, security and privacy concerns of performing an evaluation and management service by telephone and the availability of in person appointments. I also discussed with the patient that there may be a patient responsible charge related to this service. The patient expressed understanding and agreed to proceed.   I discussed the assessment and treatment plan with the patient. The patient was provided an opportunity to ask questions and all were answered. The patient agreed with the plan and demonstrated an understanding of the instructions.   The patient was advised to call back or seek an in-person evaluation if the symptoms worsen or if the condition fails to improve as anticipated.  I provided 25  minutes of non-face-to-face time during this encounter.  HISTORY/CURRENT STATUS:  HPI For routine med check.   States she feels well for the most part. Tires easily but is anemic. PCP follows. Patient is able to enjoy things.  Motivation is fair to good.   No extreme sadness, tearfulness, or feelings of hopelessness.  Sleeps well most of the time. ADLs and personal hygiene are normal.   Denies any changes in concentration, making decisions, or remembering things.  Appetite has not changed.  Weight is stable. No c/o anxiety.  Not Denies suicidal or homicidal thoughts.  Patient denies increased energy with decreased need for sleep, increased talkativeness, racing thoughts, impulsivity or  risky behaviors, increased spending, increased libido, grandiosity, increased irritability or anger, paranoia, or hallucinations.  Denies dizziness, syncope, seizures, numbness, tingling, tremor, tics, unsteady gait, slurred speech, confusion. Denies muscle or joint pain, stiffness, or dystonia.  Individual Medical History/ Review of Systems: Changes? :Yes    anemic on recent labs, undergoing further work up.  Also more back pain, in PT  Past medications for mental health diagnoses include: Cymbalta, Seroquel, Gabapentin, Wellbutrin  Allergies: Crestor [rosuvastatin calcium], Asa [aspirin], Dicyclomine, Topiramate, and Celebrex [celecoxib]  Current Medications:  Current Outpatient Medications:    allopurinol (ZYLOPRIM) 300 MG tablet, TAKE 1 AND 1/2 TABLETS(450 MG) BY MOUTH DAILY, Disp: 45 tablet, Rfl: 2   amLODipine (NORVASC) 10 MG tablet, TAKE 1 TABLET DAILY, Disp: 90 tablet, Rfl: 1   aspirin 81 MG tablet, Take 81 mg by mouth daily., Disp: , Rfl:    B Complex-C (B-COMPLEX WITH VITAMIN C) tablet, Take 1 tablet by mouth daily., Disp: , Rfl:    buPROPion (WELLBUTRIN XL) 300 MG 24 hr tablet, Take 1 tablet (300 mg total) by mouth in the morning., Disp: 90 tablet, Rfl: 1   butalbital-acetaminophen-caffeine (FIORICET) 50-325-40 MG tablet, Take 1 tablet by mouth every 6 (six) hours as needed for migraine., Disp: 10 tablet, Rfl: 3   Cholecalciferol (VITAMIN D) 2000 UNITS CAPS, Take 2,000 Units by mouth daily. , Disp: , Rfl:    DULoxetine (CYMBALTA) 60 MG capsule, Take 2 capsules (120 mg total) by mouth daily., Disp: 180 capsule, Rfl: 1   famotidine (PEPCID) 40 MG tablet, Take 40 mg by mouth daily., Disp: , Rfl:    ferrous sulfate 325 (65  FE) MG tablet, Take 1 tablet (325 mg total) by mouth 2 (two) times daily with a meal. (Patient taking differently: Take 325 mg by mouth daily with breakfast.), Disp: , Rfl: 3   fluticasone (FLONASE) 50 MCG/ACT nasal spray, Place 1 spray into both nostrils as needed  for allergies or rhinitis., Disp: , Rfl:    gabapentin (NEURONTIN) 300 MG capsule, Take 2 capsules (600 mg total) by mouth 3 (three) times daily., Disp: 180 capsule, Rfl: 1   hydrochlorothiazide (HYDRODIURIL) 25 MG tablet, Take 25 mg by mouth daily., Disp: , Rfl:    hyoscyamine (LEVSIN SL) 0.125 MG SL tablet, SMARTSIG:1-2 Tablet(s) Sublingual Every 4-6 Hours PRN, Disp: , Rfl:    losartan (COZAAR) 100 MG tablet, Take 1 tablet (100 mg total) by mouth daily., Disp: 30 tablet, Rfl: 5   methocarbamol (ROBAXIN) 750 MG tablet, Take 750 mg by mouth every 6 (six) hours as needed for muscle spasms., Disp: , Rfl:    metoprolol succinate (TOPROL-XL) 100 MG 24 hr tablet, TAKE 1 TABLET DAILY, Disp: 90 tablet, Rfl: 1   omeprazole (PRILOSEC) 40 MG capsule, Take 40 mg by mouth daily., Disp: , Rfl:    oxyCODONE 10 MG TABS, Take 1 tablet (10 mg total) by mouth every 4 (four) hours as needed for severe pain ((score 7 to 10))., Disp: 30 tablet, Rfl: 0   QUEtiapine (SEROQUEL) 400 MG tablet, TAKE 1 TABLET(400 MG) BY MOUTH AT BEDTIME, Disp: 90 tablet, Rfl: 1   zonisamide (ZONEGRAN) 25 MG capsule, Take 3 capsules (75 mg total) by mouth at bedtime., Disp: 270 capsule, Rfl: 3   Garlic 123XX123 MG CAPS, Take 1,000 mg by mouth daily. (Patient not taking: Reported on 04/13/2022), Disp: , Rfl:  Medication Side Effects: none  Family Medical/ Social History: Changes?  No  MENTAL HEALTH EXAM:  There were no vitals taken for this visit.There is no height or weight on file to calculate BMI.  General Appearance:  unable to assess  Eye Contact:   unable to assess  Speech:  Clear and Coherent and Normal Rate  Volume:  Normal  Mood:  Euthymic  Affect:   unable to assess  Thought Process:  Goal Directed and Descriptions of Associations: Intact  Orientation:  Full (Time, Place, and Person)  Thought Content: Logical   Suicidal Thoughts:  No  Homicidal Thoughts:  No  Memory:  WNL  Judgement:  Good  Insight:  Good  Psychomotor  Activity:   unable to assess  Concentration:  Concentration: Good and Attention Span: Good  Recall:  Good  Fund of Knowledge: Good  Language: Good  Assets:  Desire for Improvement Financial Resources/Insurance Housing Transportation  ADL's:  Intact  Cognition: WNL  Prognosis:  Good   Labs 03/31/2022 reviewed  DIAGNOSES:    ICD-10-CM   1. Bipolar 2 disorder (HCC)  F31.81     2. Insomnia due to other mental disorder  F51.05    F99     3. Chronic pain syndrome  G89.4       Receiving Psychotherapy: No   RECOMMENDATIONS:  PDMP was reviewed.  No mental health controlled substances.  Oxycodone prescribed 08/03/2021.   I provided 25  minutes of non-face-to-face time during this encounter, including time spent before and after the visit in records review, medical decision making, counseling pertinent to today's visit, and charting.   She's doing well w/ current mental health meds so no changes needed.   Continue Wellbutrin XL 300 mg, 1 p.o. every morning.  Continue Cymbalta 60 mg, 2 p.o. daily. Continue Seroquel 400 mg, 1 p.o. nightly. Continue gabapentin 300 mg, 2 p.o. 3 times daily. (For pain) Continue B complex, vitamin D, multivitamin, and fish oil. Needs Lipid panel at next lab draw by PCP, since on AP.  She will disc w/ them at [facility].  Return in 6 months.  Donnal Moat, PA-C

## 2022-04-14 ENCOUNTER — Inpatient Hospital Stay: Admission: RE | Admit: 2022-04-14 | Payer: PPO | Source: Ambulatory Visit

## 2022-04-27 ENCOUNTER — Encounter: Payer: Self-pay | Admitting: Physician Assistant

## 2022-05-04 ENCOUNTER — Telehealth: Payer: Self-pay | Admitting: Neurology

## 2022-05-04 NOTE — Telephone Encounter (Signed)
Pt called stating that the zonisamide (ZONEGRAN) 25 MG capsule is no longer working for her. She states that she has been suffering a migraine for the last couple of days with the medication not working. Pt would like to discuss other options but not BOTOX.

## 2022-05-05 NOTE — Telephone Encounter (Signed)
Called pt and scheduled her an follow-up appointment with Sarah on 3/14 @ 7:45 am.

## 2022-05-11 ENCOUNTER — Encounter: Payer: Self-pay | Admitting: Nurse Practitioner

## 2022-05-11 ENCOUNTER — Ambulatory Visit: Payer: PPO | Admitting: Nurse Practitioner

## 2022-05-11 ENCOUNTER — Other Ambulatory Visit (INDEPENDENT_AMBULATORY_CARE_PROVIDER_SITE_OTHER): Payer: PPO

## 2022-05-11 VITALS — BP 128/82 | HR 73 | Ht 65.0 in | Wt 180.0 lb

## 2022-05-11 DIAGNOSIS — K219 Gastro-esophageal reflux disease without esophagitis: Secondary | ICD-10-CM | POA: Diagnosis not present

## 2022-05-11 DIAGNOSIS — R195 Other fecal abnormalities: Secondary | ICD-10-CM | POA: Diagnosis not present

## 2022-05-11 DIAGNOSIS — D500 Iron deficiency anemia secondary to blood loss (chronic): Secondary | ICD-10-CM

## 2022-05-11 LAB — CBC
HCT: 33.6 % — ABNORMAL LOW (ref 36.0–46.0)
Hemoglobin: 10.7 g/dL — ABNORMAL LOW (ref 12.0–15.0)
MCHC: 31.8 g/dL (ref 30.0–36.0)
MCV: 86.5 fl (ref 78.0–100.0)
Platelets: 336 10*3/uL (ref 150.0–400.0)
RBC: 3.89 Mil/uL (ref 3.87–5.11)
RDW: 21 % — ABNORMAL HIGH (ref 11.5–15.5)
WBC: 8.9 10*3/uL (ref 4.0–10.5)

## 2022-05-11 MED ORDER — NA SULFATE-K SULFATE-MG SULF 17.5-3.13-1.6 GM/177ML PO SOLN: ORAL | 0 refills | Status: DC

## 2022-05-11 NOTE — Progress Notes (Signed)
Assessment:    72 yo female with the following:   Iron deficiency anemia Dark stool predating initiation of iron  Hemoccult positive stool  Occasional regurgitation of dark liquid ( ? Blood) Seems like a chronic, slow GI bleed. Probably upper source based on symptoms but hard to know. Rule out erosive disease, PUD,  AVMs, intestinal neoplasm   GERD with regurgitation.   History of colon polyps.  Small tubular adenoma removed in April 2019. Told to have repeat exam in 5 years. Based on new polyp surveillance guideline she would be eligible for a 7 year follow up colonoscopy but will be done sooner given IDA    IBS-D.  Negative random colon biopsies in 2019. Bile salt diarrhea thought to be contributing.   Plan:  - CBC today to make sure hgb is stable - Schedule for EGD and colonoscopy with Dr Carlean Purl on 3/26. The risks and benefits of EGD and colonoscopy with possible polypectomy / biopsies were discussed and the patient agrees to proceed.  -Other than a baby asa she isn't taking any NSAIDS.  - Continue ferrous sulfate BID. Hold off on Niferex for now ( both on home med list) -Anti-reflux measures discussed.  -Continue omeprazole in am , pepcid at night   History of Present Illness   Tammy Mullins is a 72 y.o. year old female known to Dr. 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  See [facility] / [facility] for additional history.  Chief Complaint:  multiple issues. Time for recall colonoscopy, recently diagnosed with anemia, dark stools, diarrhea    Patient referred by PCP for "history of melena"  Starting in November Tammy Mullins began seeing some dark stools and coughing/regurgitating some dark liquid through her nose, especially at night.   She has chronic anemia, baseline hgb in mid 11 range . In early February her hemoglobin was  9.7.  She was started on oral iron. She continues to have dark stools but unsure now if just due to oral iron.      Saw PCP for anemia follow up on 04/11/2022.  hemoglobin was down to 8.8.  TIBC elevated at 612 with a 3% iron saturation.   Serum ferritin was 6.6.   Vitamin B12 normal at 380.   Folate normal at 9.1.   Tammy Mullins has 2 types of oral iron listed on her home med list, it is unclear which one she is taking ( ? Or maybe both).   Tammy Mullins takes a daily baby aspirin, no other NSAID.   Tammy Mullins has no abdominal pain. Her chronic loose stools have become more of a problem lately. She has to have a BM after almost every meal. No recent antibiotics.   Previous GI history: Most recent colonoscopy  April 2019 - One diminutive polyp in the ascending colon, removed with a cold snare. Resected and retrieved. - The examined portion of the ileum was normal. - Foreign body in the distal sigmoid colon. Removal was successful. - The examination was otherwise normal on direct and retroflexion views. - Biopsies were taken with a cold forceps from the right colon and left colon for evaluation of microscopic colitis. She had new uncontrollable diarrhea with fecal incontinence Surgical [P], ascending, polyp - TUBULAR ADENOMA (1 OF 1 FRAGMENTS) - NO HIGH GRADE DYSPLASIA OR MALIGNANCY IDENTIFIED 2. Surgical [P], random sites - BENIGN COLONIC MUCOSA - NO ACTIVE INFLAMMATION  OR EVIDENCE OF MICROSCOPIC COLITIS - NO HIGH GRADE DYSPLASIA OR MALIGNANCY IDENTIFIED 3. Surgical [P], sigmoid, polyp - GROSS ONLY: FOREIGN BODY; SEE GROSS DESCRIPTIO   Previous Labs / Imaging::    Latest Ref Rng & Units 03/31/2022    2:54 PM 09/28/2021    3:04 PM 06/23/2021    3:24 PM  CBC  WBC 3.8 - 10.8 Thousand/uL 6.9  6.3  6.7   Hemoglobin 11.7 - 15.5 g/dL 9.7  11.7  11.4   Hematocrit 35.0 - 45.0 % 30.9  36.3  35.8   Platelets 140 - 400 Thousand/uL 319  305  325     No results found for: "LIPASE"    Latest Ref Rng &  Units 03/31/2022    2:54 PM 09/28/2021    3:04 PM 06/23/2021    3:24 PM  CMP  Glucose 65 - 99 mg/dL 107  113  102   BUN 7 - 25 mg/dL '14  14  18   '$ Creatinine 0.60 - 1.00 mg/dL 0.82  0.98  0.78   Sodium 135 - 146 mmol/L 141  142  144   Potassium 3.5 - 5.3 mmol/L 4.0  4.2  5.2   Chloride 98 - 110 mmol/L 105  106  107   CO2 20 - 32 mmol/L '29  30  29   '$ Calcium 8.6 - 10.4 mg/dL 9.7  9.6  9.7   Total Protein 6.1 - 8.1 g/dL 6.5  6.4  6.3   Total Bilirubin 0.2 - 1.2 mg/dL 0.2  0.3  0.2   AST 10 - 35 U/L '10  11  10   '$ ALT 6 - 29 U/L '7  9  9       '$ Past Medical History:  Diagnosis Date   Allergic rhinitis, cause unspecified    Allergy    Anxiety state, unspecified    Arthritis    Attention deficit disorder without mention of hyperactivity    Barrett's esophagus    Blood transfusion without reported diagnosis 2018-10   Chronic pain syndrome    Colon ulcer - IC valve 08/10/2016   Depression    Diabetes type 2, controlled ([place]) 01/09/2014   Diverticulosis of colon (without mention of hemorrhage)    Esophageal reflux    Fibromyalgia    Gout, unspecified    Headache    Hiatal hernia    Irritable bowel syndrome    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    Myalgia and myositis, unspecified    Osteoarthrosis, unspecified whether generalized or localized, unspecified site    Personal history of unspecified circulatory disease    Pure hypercholesterolemia    Radial styloid tenosynovitis    Unspecified essential hypertension    Unspecified nonpsychotic mental disorder    Unspecified pruritic disorder    Past Surgical History:  Procedure Laterality Date   ANKLE SURGERY Right    APPENDECTOMY     BACK SURGERY  03/19/2021   CARPAL TUNNEL RELEASE Right    CERVICAL SPINE SURGERY     plates and pins   CHOLECYSTECTOMY N/A 06/26/2015   Procedure: LAPAROSCOPIC CHOLECYSTECTOMY;  Surgeon: Ralene Ok, MD;  Location: [place];  Service: General;  Laterality: N/A;   COLONOSCOPY      COLONOSCOPY  2019   ESOPHAGOGASTRODUODENOSCOPY     ESOPHAGOGASTRODUODENOSCOPY N/A 12/04/2016   Procedure: ESOPHAGOGASTRODUODENOSCOPY (EGD);  Surgeon: Ladene Artist, MD;  [facility] END[facility]COPY;  Service: Endoscopy;  Laterality: N/A;   LAPAROSCOPIC APPENDECTOMY N/A 06/26/2015   Procedure: LAPAROSCOPIC  APPENDECTOMY;  Surgeon: Ralene Ok, MD;  Location: [place];  Service: General;  Laterality: N/A;   ROTATOR CUFF REPAIR Right    SALPINGOOPHORECTOMY  Left   ectopic 1980   TOTAL SHOULDER ARTHROPLASTY     TRANSFORAMINAL LUMBAR INTERBODY FUSION (TLIF) WITH PEDICLE SCREW FIXATION 2 Mullins N/A 01/28/2019   Procedure: TRANSFORAMINAL LUMBAR INTERBODY FUSION LUMBAR THREE-FOUR, LUMBAR FOUR-FIVE;  Surgeon: Eustace Moore, MD;  Location: [place];  Service: Neurosurgery;  Laterality: N/A;  posterior   UPPER GASTROINTESTINAL ENDOSCOPY     Family History  Problem Relation Age of Onset   Cirrhosis Father    Cancer Maternal Grandmother        ?   Lung disease Mother        ?    Glaucoma Mother    Glaucoma Brother    Prostate cancer Brother    Heart disease Sister    Heart attack Sister    Kidney disease Neg Hx    Diabetes Neg Hx    Colon cancer Neg Hx    Colon polyps Neg Hx    Rectal cancer Neg Hx    Stomach cancer Neg Hx    Social History   Tobacco Use   Smoking status: Never    Passive exposure: Never   Smokeless tobacco: Never  Vaping Use   Vaping Use: Never used  Substance Use Topics   Alcohol use: No   Drug use: No   Current Outpatient Medications  Medication Sig Dispense Refill   allopurinol (ZYLOPRIM) 300 MG tablet TAKE 1 AND 1/2 TABLETS(450 MG) BY MOUTH DAILY 45 tablet 2   amLODipine (NORVASC) 10 MG tablet TAKE 1 TABLET DAILY 90 tablet 1   aspirin 81 MG tablet Take 81 mg by mouth daily.     B Complex-C (B-COMPLEX WITH VITAMIN C) tablet Take 1 tablet by mouth daily.     buPROPion (WELLBUTRIN XL) 300 MG 24 hr tablet Take 1 tablet (300 mg total) by mouth in the morning. 90  tablet 1   butalbital-acetaminophen-caffeine (FIORICET) 50-325-40 MG tablet Take 1 tablet by mouth every 6 (six) hours as needed for migraine. 10 tablet 3   Cholecalciferol (VITAMIN D) 2000 UNITS CAPS Take 2,000 Units by mouth daily.      DULoxetine (CYMBALTA) 60 MG capsule Take 2 capsules (120 mg total) by mouth daily. 180 capsule 1   famotidine (PEPCID) 40 MG tablet Take 40 mg by mouth daily.     ferrous sulfate 325 (65 FE) MG tablet Take 1 tablet (325 mg total) by mouth 2 (two) times daily with a meal. (Patient taking differently: Take 325 mg by mouth daily with breakfast.)  3   fluticasone (FLONASE) 50 MCG/ACT nasal spray Place 1 spray into both nostrils as needed for allergies or rhinitis.     gabapentin (NEURONTIN) 300 MG capsule Take 2 capsules (600 mg total) by mouth 3 (three) times daily. 99991111 capsule 1   Garlic 123XX123 MG CAPS Take 1,000 mg by mouth daily. (Patient not taking: Reported on 04/13/2022)     hydrochlorothiazide (HYDRODIURIL) 25 MG tablet Take 25 mg by mouth daily.     hyoscyamine (LEVSIN SL) 0.125 MG SL tablet SMARTSIG:1-2 Tablet(s) Sublingual Every 4-6 Hours PRN     losartan (COZAAR) 100 MG tablet Take 1 tablet (100 mg total) by mouth daily. 30 tablet 5   methocarbamol (ROBAXIN) 750 MG tablet Take 750 mg by mouth every 6 (six) hours as needed for muscle spasms.     metoprolol  succinate (TOPROL-XL) 100 MG 24 hr tablet TAKE 1 TABLET DAILY 90 tablet 1   omeprazole (PRILOSEC) 40 MG capsule Take 40 mg by mouth daily.     oxyCODONE 10 MG TABS Take 1 tablet (10 mg total) by mouth every 4 (four) hours as needed for severe pain ((score 7 to 10)). 30 tablet 0   QUEtiapine (SEROQUEL) 400 MG tablet TAKE 1 TABLET(400 MG) BY MOUTH AT BEDTIME 90 tablet 1   zonisamide (ZONEGRAN) 25 MG capsule Take 3 capsules (75 mg total) by mouth at bedtime. 270 capsule 3   No current facility-administered medications for this visit.   Allergies  Allergen Reactions   Crestor [Rosuvastatin Calcium] Other  (See Comments)    Causes severe body aches   Asa [Aspirin]     GI upset in hight doses can take a '81mg'$    Dicyclomine Other (See Comments)    Body and face rash, swelling.   Topiramate Diarrhea   Celebrex [Celecoxib] Swelling and Rash    Swelling in legs     Review of Systems: Positive for arthritis, back pain, depression, headaches, skin rash.  All other systems reviewed and negative except where noted in HPI.   Wt Readings from Last 3 Encounters:  03/31/22 180 lb (81.6 kg)  09/28/21 186 lb (84.4 kg)  09/02/21 172 lb (78 kg)    Physical Exam:  BP 128/82   Pulse 73   Ht '5\' 5"'$  (1.651 m)   Wt 180 lb (81.6 kg)   BMI 29.95 kg/m  Constitutional:  Pleasant, generally well appearing female in no acute distress. Psychiatric:  Normal mood and affect. Behavior is normal. EENT: Pupils normal.  Conjunctivae are normal. No scleral icterus. Neck supple.  Cardiovascular: Normal rate, regular rhythm.  Pulmonary/chest: Effort normal and breath sounds normal. No wheezing, rales or rhonchi. Abdominal: Soft, nondistended, nontender. Bowel sounds active throughout. There are no masses palpable. No hepatomegaly. Rectal:  scant amount of residual black stool in vault, Hemoccult positive.  Neurological: Alert and oriented to person place and time. Skin: Skin is warm and dry. No rashes noted.  Tye Savoy, NP  05/11/2022, 2:40 PM  Cc:  Referring Provider Scott-Lowe, Sherlyn Hay, MD

## 2022-05-11 NOTE — Patient Instructions (Addendum)
Your provider has requested that you go to the basement level for lab work before leaving today. Press "B" on the elevator. The lab is located at the first door on the left as you exit the elevator.   You have been scheduled for an endoscopy and colonoscopy. Please follow the written instructions given to you at your visit today. Please pick up your prep supplies at the pharmacy within the next 1-3 days. If you use inhalers (even only as needed), please bring them with you on the day of your procedure.   We have sent the following medications to your pharmacy for you to pick up at your convenience: Suprep  If your blood pressure at your visit was 140/90 or greater, please contact your primary care physician to follow up on this.  _______________________________________________________  If you are age 74 or older, your body mass index should be between 23-30. Your Body mass index is 29.95 kg/m. If this is out of the aforementioned range listed, please consider follow up with your Primary Care Provider.  If you are age 67 or younger, your body mass index should be between 19-25. Your Body mass index is 29.95 kg/m. If this is out of the aformentioned range listed, please consider follow up with your Primary Care Provider.   ________________________________________________________  The [facility] providers would like to encourage you to use [facility] to communicate with providers for non-urgent requests or questions.  Due to long hold times on the telephone, sending your provider a message by [facility] may be a faster and more efficient way to get a response.  Please allow 48 business hours for a response.  Please remember that this is for non-urgent requests.   Due to recent changes in healthcare laws, you may see the results of your imaging and laboratory studies on MyChart before your provider has had a chance to review them.  We understand that in some cases there may be results that are confusing  or concerning to you. Not all laboratory results come back in the same time frame and the provider may be waiting for multiple results in order to interpret others.  Please give [place] 48 hours in order for your provider to thoroughly review all the results before contacting the office for clarification of your results.    Thank you for entrusting me with your care and choosing [facility].  Tye Savoy NP

## 2022-05-12 ENCOUNTER — Ambulatory Visit: Payer: PPO | Admitting: Neurology

## 2022-05-12 ENCOUNTER — Encounter: Payer: Self-pay | Admitting: Neurology
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
## 2022-05-17 ENCOUNTER — Ambulatory Visit: Payer: PPO | Admitting: Neurology

## 2022-05-17 ENCOUNTER — Encounter: Payer: Self-pay | Admitting: Neurology

## 2022-05-17 VITALS — BP 215/110 | HR 75 | Ht 65.0 in | Wt 180.0 lb

## 2022-05-17 DIAGNOSIS — I10 Essential (primary) hypertension: Secondary | ICD-10-CM

## 2022-05-17 DIAGNOSIS — R600 Localized edema: Secondary | ICD-10-CM | POA: Diagnosis not present

## 2022-05-17 DIAGNOSIS — G43719 Chronic migraine without aura, intractable, without status migrainosus: Secondary | ICD-10-CM | POA: Diagnosis not present

## 2022-05-17 MED ORDER — ZONISAMIDE 100 MG PO CAPS: 100.0000 mg | ORAL_CAPSULE | Freq: Every day | ORAL | 11 refills | Status: DC

## 2022-05-17 MED ORDER — BUTALBITAL-APAP-CAFFEINE 50-325-40 MG PO TABS: 1.0000 | ORAL_TABLET | Freq: Four times a day (QID) | ORAL | 3 refills | Status: DC | PRN

## 2022-05-17 NOTE — Patient Instructions (Signed)
Increase Zonegran 100 mg at bedtime Continue Fioricet as needed for acute headache Follow-up with PCP today regarding blood pressure Call for continued headaches, otherwise follow-up in 6 months
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
## 2022-05-25 ENCOUNTER — Encounter: Payer: Self-pay | Admitting: Internal Medicine

## 2022-05-25 ENCOUNTER — Ambulatory Visit (AMBULATORY_SURGERY_CENTER): Payer: PPO | Admitting: Internal Medicine

## 2022-05-25 VITALS — BP 119/65 | HR 83 | Temp 97.5°F | Resp 16 | Ht 65.0 in | Wt 180.0 lb

## 2022-05-25 DIAGNOSIS — D123 Benign neoplasm of transverse colon: Secondary | ICD-10-CM | POA: Diagnosis not present

## 2022-05-25 DIAGNOSIS — K2100 Gastro-esophageal reflux disease with esophagitis, without bleeding: Secondary | ICD-10-CM

## 2022-05-25 DIAGNOSIS — D509 Iron deficiency anemia, unspecified: Secondary | ICD-10-CM | POA: Diagnosis not present

## 2022-05-25 DIAGNOSIS — K317 Polyp of stomach and duodenum: Secondary | ICD-10-CM

## 2022-05-25 DIAGNOSIS — K2970 Gastritis, unspecified, without bleeding: Secondary | ICD-10-CM

## 2022-05-25 DIAGNOSIS — K219 Gastro-esophageal reflux disease without esophagitis: Secondary | ICD-10-CM

## 2022-05-25 DIAGNOSIS — K5140 Inflammatory polyps of colon without complications: Secondary | ICD-10-CM

## 2022-05-25 DIAGNOSIS — K635 Polyp of colon: Secondary | ICD-10-CM

## 2022-05-25 DIAGNOSIS — D500 Iron deficiency anemia secondary to blood loss (chronic): Secondary | ICD-10-CM

## 2022-05-25 DIAGNOSIS — D125 Benign neoplasm of sigmoid colon: Secondary | ICD-10-CM

## 2022-05-25 MED ORDER — SODIUM CHLORIDE 0.9 % IV SOLN: 500.0000 mL | Freq: Once | INTRAVENOUS | Status: DC

## 2022-05-25 MED ORDER — OMEPRAZOLE 40 MG PO CPDR: 40.0000 mg | DELAYED_RELEASE_CAPSULE | Freq: Two times a day (BID) | ORAL | 3 refills | Status: DC

## 2022-05-25 NOTE — Progress Notes (Signed)
Vss nad trans to pacu Bovie pad site clean no redness

## 2022-05-25 NOTE — Progress Notes (Signed)
History and Physical Interval Note:  05/25/2022 2:24 PM  Tammy Mullins  has presented today for endoscopic procedure(s), with the diagnosis of  Encounter Diagnoses  Name Primary?   Iron deficiency anemia due to chronic blood loss Yes   Gastroesophageal reflux disease, unspecified whether esophagitis present   .  The various methods of evaluation and treatment have been discussed with the patient and/or family. After consideration of risks, benefits and other options for treatment, the patient has consented to  the endoscopic procedure(s).   The patient's history has been reviewed, patient examined, no change in status, stable for endoscopic procedure(s).  I have reviewed the patient's chart and labs.  Questions were answered to the patient's satisfaction.     Gatha Mayer, MD, [name]

## 2022-05-25 NOTE — Progress Notes (Signed)
Called to room to assist during endoscopic procedure.  Patient ID and intended procedure confirmed with present staff. Received instructions for my participation in the procedure from the performing physician.  

## 2022-05-25 NOTE — Progress Notes (Signed)
Pt's states no medical or surgical changes since previsit or office visit. 

## 2022-05-25 NOTE — Op Note (Signed)
[place] Patient Name: Tammy Mullins Procedure Date: 05/25/2022 2:27 PM MRN: [id number] Endoscopist: Gatha Mayer , MD, [id number] Age: 73 Referring MD:  Date of Birth: 11-18-1950 Gender: Female Account #: [id number] Procedure:                Upper GI endoscopy Indications:              Iron deficiency anemia Medicines:                Monitored Anesthesia Care Procedure:                Pre-Anesthesia Assessment:                           - Prior to the procedure, a History and Physical                            was performed, and patient medications and                            allergies were reviewed. The patient's tolerance of                            previous anesthesia was also reviewed. The risks                            and benefits of the procedure and the sedation                            options and risks were discussed with the patient.                            All questions were answered, and informed consent                            was obtained. Prior Anticoagulants: The patient has                            taken no anticoagulant or antiplatelet agents. ASA                            Grade Assessment: III - A patient with severe                            systemic disease. After reviewing the risks and                            benefits, the patient was deemed in satisfactory                            condition to undergo the procedure.                           After obtaining informed consent, the endoscope was  passed under direct vision. Throughout the                            procedure, the patient's blood pressure, pulse, and                            oxygen saturations were monitored continuously. The                            Olympus Scope [id number] was introduced through the                            mouth, and advanced to the second part of duodenum.                            The upper GI endoscopy  was accomplished without                            difficulty. The patient tolerated the procedure                            well. Scope In: Scope Out: Findings:                 LA Grade C (one or more mucosal breaks continuous                            between tops of 2 or more mucosal folds, less than                            75% circumference) esophagitis was found 30 to 35                            cm from the incisors. Biopsies were taken with a                            cold forceps for histology. Verification of patient                            identification for the specimen was done. Estimated                            blood loss was minimal.                           A non-obstructing Schatzki ring was found at the                            gastroesophageal junction. Biopsies were taken with                            a cold forceps for histology. Verification of  patient identification for the specimen was done.                            Estimated blood loss was minimal.                           A 5 cm hiatal hernia was present.                           The gastroesophageal flap valve was visualized                            endoscopically and classified as Hill Grade IV (no                            fold, wide open lumen, hiatal hernia present).                           A single 10 mm semi-sessile polyp with stigmata of                            recent bleeding was found in the gastric antrum.                            The polyp was removed with a hot snare. Resection                            and retrieval were complete. Verification of                            patient identification for the specimen was done.                            Estimated blood loss was minimal. To prevent                            bleeding after the polypectomy, one hemostatic clip                            was successfully placed. Clip manufacturer:  [employer]. There was no bleeding at the end                            of the procedure. Estimated blood loss: none.                           Diffuse moderate inflammation characterized by                            congestion (edema), erosions, erythema and  friability was found in the entire examined                            stomach. Biopsies were taken with a cold forceps                            for histology. Verification of patient                            identification for the specimen was done. Estimated                            blood loss was minimal.                           The exam was otherwise without abnormality.                           The cardia and gastric fundus were otherwise normal                            on retroflexion. Complications:            No immediate complications. Estimated Blood Loss:     Estimated blood loss was minimal. Impression:               - LA Grade C reflux esophagitis. Biopsied.                           - Non-obstructing Schatzki ring. Biopsied.                           - 5 cm hiatal hernia. Patulous GE junction - I                            think this is sig contributor to her GERD                           - Gastroesophageal flap valve classified as Hill                            Grade IV (no fold, wide open lumen, hiatal hernia                            present).                           - A single gastric polyp. Resected and retrieved.                            Clip was placed. Clip manufacturer: [employer].                           -  Gastritis. Biopsied.                           - The examination was otherwise normal. Recommendation:           - Patient has a contact number available for                            emergencies. The signs and symptoms of potential                            delayed complications  were discussed with the                            patient. Return to normal activities tomorrow.                            Written discharge instructions were provided to the                            patient.                           - Resume previous diet.                           - Continue present medications.                           - See the other procedure note for documentation of                            additional recommendations.                           - Change to bid omeprazole 40 mg                           Await pathology                           continue iron supplementation                           - No aspirin, ibuprofen, naproxen, or other                            non-steroidal anti-inflammatory drugs for 2 weeks                            after polyp removal. Gatha Mayer, MD 05/25/2022 3:34:58 PM This report has been signed electronically.

## 2022-05-25 NOTE — Op Note (Addendum)
[place] Patient Name: Tammy Mullins Procedure Date: 05/25/2022 2:27 PM MRN: [id number] Endoscopist: Gatha Mayer , MD, [id number] Age: 72 Referring MD:  Date of Birth: [birth year] Gender: Female Account #: [id number] Procedure:                Colonoscopy Indications:              Iron deficiency anemia Medicines:                Monitored Anesthesia Care Procedure:                Pre-Anesthesia Assessment:                           - Prior to the procedure, a History and Physical                            was performed, and patient medications and                            allergies were reviewed. The patient's tolerance of                            previous anesthesia was also reviewed. The risks                            and benefits of the procedure and the sedation                            options and risks were discussed with the patient.                            All questions were answered, and informed consent                            was obtained. Prior Anticoagulants: The patient has                            taken no anticoagulant or antiplatelet agents. ASA                            Grade Assessment: III - A patient with severe                            systemic disease. After reviewing the risks and                            benefits, the patient was deemed in satisfactory                            condition to undergo the procedure.                           After obtaining informed consent, the colonoscope  was passed under direct vision. Throughout the                            procedure, the patient's blood pressure, pulse, and                            oxygen saturations were monitored continuously. The                            olympus scope [id number] was introduced through the                            anus and advanced to the the cecum, identified by                            appendiceal orifice and  ileocecal valve. The                            colonoscopy was performed without difficulty. The                            patient tolerated the procedure well. The quality                            of the bowel preparation was excellent. The                            ileocecal valve, appendiceal orifice, and rectum                            were photographed. The bowel preparation used was                            Miralax via split dose instruction. Scope In: 3:04:53 PM Scope Out: 3:20:24 PM Scope Withdrawal Time: 0 hours 12 minutes 22 seconds  Total Procedure Duration: 0 hours 15 minutes 31 seconds  Findings:                 The perianal and digital rectal examinations were                            normal.                           An 8 mm polyp was found in the transverse colon.                            The polyp was semi-pedunculated. The polyp was                            removed with a hot snare. Resection and retrieval                            were complete. Verification of patient  identification for the specimen was done. Estimated                            blood loss: none.                           A 3 mm polyp was found in the sigmoid colon. The                            polyp was sessile. The polyp was removed with a                            cold snare. Resection and retrieval were complete.                            Verification of patient identification for the                            specimen was done. Estimated blood loss was minimal.                           Multiple diverticula were found in the sigmoid                            colon.                           There was evidence of a prior appendectomy                            anastomosis at the appendiceal orifice. This was                            characterized by healthy appearing mucosa.                           The exam was otherwise without abnormality on                             direct and retroflexion views. Complications:            No immediate complications. Estimated Blood Loss:     Estimated blood loss was minimal. Impression:               - One 8 mm polyp in the transverse colon, removed                            with a hot snare. Resected and retrieved.                           - One 3 mm polyp in the sigmoid colon, removed with                            a cold snare. Resected and retrieved.                           -  Diverticulosis in the sigmoid colon.                           - Appendectomy anastomosis, characterized by                            healthy appearing mucosa.                           - The examination was otherwise normal on direct                            and retroflexion views. Recommendation:           - Patient has a contact number available for                            emergencies. The signs and symptoms of potential                            delayed complications were discussed with the                            patient. Return to normal activities tomorrow.                            Written discharge instructions were provided to the                            patient.                           - Resume previous diet.                           - Continue present medications.                           - No aspirin, ibuprofen, naproxen, or other                            non-steroidal anti-inflammatory drugs for 2 weeks                            after polyp removal. ? need for ASA long-term                           - Await pathology results.                           - Further plans pending path review - I suspect                            gastric findings had most to do with iron deficiency Gatha Mayer, MD 05/25/2022 3:42:53 PM This report has been signed electronically.

## 2022-05-25 NOTE — Patient Instructions (Addendum)
I saw inflammation in the esophagus and stomach - biopsies taken.  Also removed a stomach polyp.  I think you are leaking blood from these abnormal areas but can heal with ,medication.  There were 2 colon polyps removed. You still have diverticulosis - thickened muscle rings and pouches in the colon wall. Please read the handout about this condition.  Please do not take aspirin again until April 11.  I am changing omeprazole - to take twice a day instead of once a day.  Once I see pathology results will make further recommendations.  YOU HAD AN ENDOSCOPIC PROCEDURE TODAY AT [facility]:   Refer to the procedure report that was given to you for any specific questions about what was found during the examination.  If the procedure report does not answer your questions, please call your gastroenterologist to clarify.  If you requested that your care partner not be given the details of your procedure findings, then the procedure report has been included in a sealed envelope for you to review at your convenience later.  YOU SHOULD EXPECT: Some feelings of bloating in the abdomen. Passage of more gas than usual.  Walking can help get rid of the air that was put into your GI tract during the procedure and reduce the bloating. If you had a lower endoscopy (such as a colonoscopy or flexible sigmoidoscopy) you may notice spotting of blood in your stool or on the toilet paper. If you underwent a bowel prep for your procedure, you may not have a normal bowel movement for a few days.  Please Note:  You might notice some irritation and congestion in your nose or some drainage.  This is from the oxygen used during your procedure.  There is no need for concern and it should clear up in a day or so.  SYMPTOMS TO REPORT IMMEDIATELY:  Following lower endoscopy (colonoscopy or flexible sigmoidoscopy):  Excessive amounts of blood in the stool  Significant tenderness or worsening of abdominal  pains  Swelling of the abdomen that is new, acute  Fever of 100F or higher  Following upper endoscopy (EGD)  Vomiting of blood or coffee ground material  New chest pain or pain under the shoulder blades  Painful or persistently difficult swallowing  New shortness of breath  Fever of 100F or higher  Black, tarry-looking stools  For urgent or emergent issues, a gastroenterologist can be reached at any hour by calling [phone]. Do not use MyChart messaging for urgent concerns.    DIET:  We do recommend a small meal at first, but then you may proceed to your regular diet.  Drink plenty of fluids but you should avoid alcoholic beverages for 24 hours.  ACTIVITY:  You should plan to take it easy for the rest of today and you should NOT DRIVE or use heavy machinery until tomorrow (because of the sedation medicines used during the test).    FOLLOW UP: Our staff will call the number listed on your records the next business day following your procedure.  We will call around 7:15- 8:00 am to check on you and address any questions or concerns that you may have regarding the information given to you following your procedure. If we do not reach you, we will leave a message.     If any biopsies were taken you will be contacted by phone or by letter within the next 1-3 weeks.  Please call us at [phone] if you have not  heard about the biopsies in 3 weeks.    SIGNATURES/CONFIDENTIALITY: You and/or your care partner have signed paperwork which will be entered into your electronic medical record.  These signatures attest to the fact that that the information above on your After Visit Summary has been reviewed and is understood.  Full responsibility of the confidentiality of this discharge information lies with you and/or your care-partner.

## 2022-05-26 ENCOUNTER — Telehealth: Payer: Self-pay

## 2022-05-26 ENCOUNTER — Other Ambulatory Visit: Payer: Self-pay | Admitting: Physician Assistant

## 2022-05-26 NOTE — Telephone Encounter (Signed)
Follow up call to pt, lm for pt to call if having any difficulty with normal activities or eating and drinking.  Also to call if any other questions or concerns.  

## 2022-06-06 DIAGNOSIS — H43813 Vitreous degeneration, bilateral: Secondary | ICD-10-CM | POA: Diagnosis not present

## 2022-06-06 DIAGNOSIS — H5203 Hypermetropia, bilateral: Secondary | ICD-10-CM | POA: Diagnosis not present

## 2022-06-06 DIAGNOSIS — H524 Presbyopia: Secondary | ICD-10-CM | POA: Diagnosis not present

## 2022-06-06 DIAGNOSIS — H04123 Dry eye syndrome of bilateral lacrimal glands: Secondary | ICD-10-CM | POA: Diagnosis not present

## 2022-06-06 DIAGNOSIS — H2513 Age-related nuclear cataract, bilateral: Secondary | ICD-10-CM | POA: Diagnosis not present

## 2022-06-06 DIAGNOSIS — H11823 Conjunctivochalasis, bilateral: Secondary | ICD-10-CM | POA: Diagnosis not present

## 2022-06-06 DIAGNOSIS — H52223 Regular astigmatism, bilateral: Secondary | ICD-10-CM | POA: Diagnosis not present

## 2022-06-07 ENCOUNTER — Other Ambulatory Visit: Payer: Self-pay

## 2022-06-07 DIAGNOSIS — K2970 Gastritis, unspecified, without bleeding: Secondary | ICD-10-CM

## 2022-06-22 ENCOUNTER — Other Ambulatory Visit: Payer: PPO

## 2022-06-22 ENCOUNTER — Other Ambulatory Visit: Payer: Self-pay | Admitting: Internal Medicine

## 2022-06-22 DIAGNOSIS — D500 Iron deficiency anemia secondary to blood loss (chronic): Secondary | ICD-10-CM

## 2022-06-22 DIAGNOSIS — K2970 Gastritis, unspecified, without bleeding: Secondary | ICD-10-CM | POA: Diagnosis not present

## 2022-06-28 LAB — INTRINSIC FACTOR ANTIBODIES: Intrinsic Factor: NEGATIVE

## 2022-06-29 LAB — ANTI-PARIETAL ANTIBODY: PARIETAL CELL AB SCREEN: NEGATIVE

## 2022-07-04 ENCOUNTER — Encounter (HOSPITAL_BASED_OUTPATIENT_CLINIC_OR_DEPARTMENT_OTHER): Payer: Self-pay

## 2022-07-11 ENCOUNTER — Ambulatory Visit (HOSPITAL_BASED_OUTPATIENT_CLINIC_OR_DEPARTMENT_OTHER): Payer: PPO | Admitting: Cardiovascular Disease

## 2022-07-11 ENCOUNTER — Encounter (HOSPITAL_BASED_OUTPATIENT_CLINIC_OR_DEPARTMENT_OTHER): Payer: Self-pay | Admitting: Cardiovascular Disease

## 2022-07-11 VITALS — BP 105/70 | HR 79 | Ht 65.0 in | Wt 178.4 lb

## 2022-07-11 DIAGNOSIS — I10 Essential (primary) hypertension: Secondary | ICD-10-CM | POA: Diagnosis not present

## 2022-07-11 DIAGNOSIS — I700 Atherosclerosis of aorta: Secondary | ICD-10-CM | POA: Insufficient documentation

## 2022-07-11 DIAGNOSIS — E785 Hyperlipidemia, unspecified: Secondary | ICD-10-CM

## 2022-07-11 HISTORY — DX: Atherosclerosis of aorta: I70.0

## 2022-07-11 NOTE — Patient Instructions (Addendum)
Medication Instructions:  Your physician recommends that you continue on your current medications as directed. Please refer to the Current Medication list given to you today.    Labwork: FASTING LP/CMET/CATACHOLAMINES/METANEPHRINES/ALDOSTERONE/RENIN/TSH SOON    Testing/Procedures: Your physician has requested that you have a renal artery duplex. During this test, an ultrasound is used to evaluate blood flow to the kidneys. Allow one hour for this exam. Do not eat after midnight the day before and avoid carbonated beverages. Take your medications as you usually do.   Follow-Up: 08/17/2022 3:00 pm with Pharm D     Special Instructions:  MONITOR YOUR BLOOD PRESSURE TWICE A DAY, LOG IN THE BOOK PROVIDED. BRING THE BOOK AND YOUR BLOOD PRESSURE MACHINE TO YOUR FOLLOW UP IN 1 MONTH   BRING ALL YOUR MEDICATIONS TO FOLLOW UP   DASH Eating Plan DASH stands for "Dietary Approaches to Stop Hypertension." The DASH eating plan is a healthy eating plan that has been shown to reduce high blood pressure (hypertension). It may also reduce your risk for type 2 diabetes, heart disease, and stroke. The DASH eating plan may also help with weight loss. What are tips for following this plan?  General guidelines Avoid eating more than 2,300 mg (milligrams) of salt (sodium) a day. If you have hypertension, you may need to reduce your sodium intake to 1,500 mg a day. Limit alcohol intake to no more than 1 drink a day for nonpregnant women and 2 drinks a day for men. One drink equals 12 oz of beer, 5 oz of wine, or 1 oz of hard liquor. Work with your health care provider to maintain a healthy body weight or to lose weight. Ask what an ideal weight is for you. Get at least 30 minutes of exercise that causes your heart to beat faster (aerobic exercise) most days of the week. Activities may include walking, swimming, or biking. Work with your health care provider or diet and nutrition specialist (dietitian) to adjust  your eating plan to your individual calorie needs. Reading food labels  Check food labels for the amount of sodium per serving. Choose foods with less than 5 percent of the Daily Value of sodium. Generally, foods with less than 300 mg of sodium per serving fit into this eating plan. To find whole grains, look for the word "whole" as the first word in the ingredient list. Shopping Buy products labeled as "low-sodium" or "no salt added." Buy fresh foods. Avoid canned foods and premade or frozen meals. Cooking Avoid adding salt when cooking. Use salt-free seasonings or herbs instead of table salt or sea salt. Check with your health care provider or pharmacist before using salt substitutes. Do not fry foods. Cook foods using healthy methods such as baking, boiling, grilling, and broiling instead. Cook with heart-healthy oils, such as olive, canola, soybean, or sunflower oil. Meal planning Eat a balanced diet that includes: 5 or more servings of fruits and vegetables each day. At each meal, try to fill half of your plate with fruits and vegetables. Up to 6-8 servings of whole grains each day. Less than 6 oz of lean meat, poultry, or fish each day. A 3-oz serving of meat is about the same size as a deck of cards. One egg equals 1 oz. 2 servings of low-fat dairy each day. A serving of nuts, seeds, or beans 5 times each week. Heart-healthy fats. Healthy fats called Omega-3 fatty acids are found in foods such as flaxseeds and coldwater fish, like sardines, salmon, and  mackerel. Limit how much you eat of the following: Canned or prepackaged foods. Food that is high in trans fat, such as fried foods. Food that is high in saturated fat, such as fatty meat. Sweets, desserts, sugary drinks, and other foods with added sugar. Full-fat dairy products. Do not salt foods before eating. Try to eat at least 2 vegetarian meals each week. Eat more home-cooked food and less restaurant, buffet, and fast  food. When eating at a restaurant, ask that your food be prepared with less salt or no salt, if possible. What foods are recommended? The items listed may not be a complete list. Talk with your dietitian about what dietary choices are best for you. Grains Whole-grain or whole-wheat bread. Whole-grain or whole-wheat pasta. Brown rice. Orpah Cobb. Bulgur. Whole-grain and low-sodium cereals. Pita bread. Low-fat, low-sodium crackers. Whole-wheat flour tortillas. Vegetables Fresh or frozen vegetables (raw, steamed, roasted, or grilled). Low-sodium or reduced-sodium tomato and vegetable juice. Low-sodium or reduced-sodium tomato sauce and tomato paste. Low-sodium or reduced-sodium canned vegetables. Fruits All fresh, dried, or frozen fruit. Canned fruit in natural juice (without added sugar). Meat and other protein foods Skinless chicken or [place]. Ground chicken or [place]. Pork with fat trimmed off. Fish and seafood. Egg whites. Dried beans, peas, or lentils. Unsalted nuts, nut butters, and seeds. Unsalted canned beans. Lean cuts of beef with fat trimmed off. Low-sodium, lean deli meat. Dairy Low-fat (1%) or fat-free (skim) milk. Fat-free, low-fat, or reduced-fat cheeses. Nonfat, low-sodium ricotta or cottage cheese. Low-fat or nonfat yogurt. Low-fat, low-sodium cheese. Fats and oils Soft margarine without trans fats. Vegetable oil. Low-fat, reduced-fat, or light mayonnaise and salad dressings (reduced-sodium). Canola, safflower, olive, soybean, and sunflower oils. Avocado. Seasoning and other foods Herbs. Spices. Seasoning mixes without salt. Unsalted popcorn and pretzels. Fat-free sweets. What foods are not recommended? The items listed may not be a complete list. Talk with your dietitian about what dietary choices are best for you. Grains Baked goods made with fat, such as croissants, muffins, or some breads. Dry pasta or rice meal packs. Vegetables Creamed or fried vegetables. Vegetables  in a cheese sauce. Regular canned vegetables (not low-sodium or reduced-sodium). Regular canned tomato sauce and paste (not low-sodium or reduced-sodium). Regular tomato and vegetable juice (not low-sodium or reduced-sodium). Rosita Fire. Olives. Fruits Canned fruit in a light or heavy syrup. Fried fruit. Fruit in cream or butter sauce. Meat and other protein foods Fatty cuts of meat. Ribs. Fried meat. Tomasa Blase. Sausage. Bologna and other processed lunch meats. Salami. Fatback. Hotdogs. Bratwurst. Salted nuts and seeds. Canned beans with added salt. Canned or smoked fish. Whole eggs or egg yolks. Chicken or [place] with skin. Dairy Whole or 2% milk, cream, and half-and-half. Whole or full-fat cream cheese. Whole-fat or sweetened yogurt. Full-fat cheese. Nondairy creamers. Whipped toppings. Processed cheese and cheese spreads. Fats and oils Butter. Stick margarine. Lard. Shortening. Ghee. Bacon fat. Tropical oils, such as coconut, palm kernel, or palm oil. Seasoning and other foods Salted popcorn and pretzels. Onion salt, garlic salt, seasoned salt, table salt, and sea salt. Worcestershire sauce. Tartar sauce. Barbecue sauce. Teriyaki sauce. Soy sauce, including reduced-sodium. Steak sauce. Canned and packaged gravies. Fish sauce. Oyster sauce. Cocktail sauce. Horseradish that you find on the shelf. Ketchup. Mustard. Meat flavorings and tenderizers. Bouillon cubes. Hot sauce and Tabasco sauce. Premade or packaged marinades. Premade or packaged taco seasonings. Relishes. Regular salad dressings. Where to find more information: National Heart, Lung, and Blood Institute: PopSteam.is American Heart Association: www.heart.org Summary The DASH  eating plan is a healthy eating plan that has been shown to reduce high blood pressure (hypertension). It may also reduce your risk for type 2 diabetes, heart disease, and stroke. With the DASH eating plan, you should limit salt (sodium) intake to 2,300 mg a day. If you  have hypertension, you may need to reduce your sodium intake to 1,500 mg a day. When on the DASH eating plan, aim to eat more fresh fruits and vegetables, whole grains, lean proteins, low-fat dairy, and heart-healthy fats. Work with your health care provider or diet and nutrition specialist (dietitian) to adjust your eating plan to your individual calorie needs. This information is not intended to replace advice given to you by your health care provider. Make sure you discuss any questions you have with your health care provider. Document Released: 02/03/2011 Document Revised: 01/27/2017 Document Reviewed: 02/08/2016 Elsevier Patient Education  2020 [employer].

## 2022-07-11 NOTE — Progress Notes (Deleted)
Advanced Hypertension Clinic Initial Assessment:    Date:  07/11/2022   ID:  Tammy Mullins, DOB 05-Oct-1950, MRN [id number]  PCP:  Lazarus Salines, MD (Inactive)  Cardiologist:  None  Nephrologist:  Referring MD: Ceasar Lund, PA   CC: Hypertension  History of Present Illness:    Tammy Mullins is a 72 y.o. female with a hx of hypertension, diabetes, OSA, hyperlipidemia, and bipolar disorder here to establish care in the Advanced Hypertension Clinic.  She last saw our her PCP 04/2022 and blood pressures were uncontrolled on metoprolol, losartan, HCTZ, and amlodipine so she was referred to the advanced hypertension clinic.  At that visit HCTZ was increased to 50 mg.   Previous antihypertensives:  Secondary Causes of Hypertension  Medications/Herbal: OCP, steroids, stimulants, antidepressants, weight loss medication, immune suppressants, NSAIDs, sympathomimetics, alcohol, caffeine, licorice, ginseng, St. John's wort, chemo  Sleep Apnea Renal artery stenosis Hyperaldosteronism Hyper/hypothyroidism Pheochromocytoma: palpitations, tachycardia, headache, diaphoresis (plasma metanephrines) Cushing's syndrome: Cushingoid facies, central obesity, proximal muscle weakness, and ecchymoses, adrenal incidentaloma (cortisol) Coarctation of the aorta  Past Medical History:  Diagnosis Date   Allergic rhinitis, cause unspecified    Allergy    Anxiety state, unspecified    Arthritis    Attention deficit disorder without mention of hyperactivity    Barrett's esophagus    Blood transfusion without reported diagnosis 2018-10   Chronic pain syndrome    Colon ulcer - IC valve 08/10/2016   Depression    Diabetes type 2, controlled (HCC) 01/09/2014   Diverticulosis of colon (without mention of hemorrhage)    Esophageal reflux    Fibromyalgia    Gout, unspecified    Headache    Hiatal hernia    Irritable bowel syndrome    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    Myalgia  and myositis, unspecified    Osteoarthrosis, unspecified whether generalized or localized, unspecified site    Personal history of unspecified circulatory disease    Pure hypercholesterolemia    Radial styloid tenosynovitis    Unspecified essential hypertension    Unspecified nonpsychotic mental disorder    Unspecified pruritic disorder     Past Surgical History:  Procedure Laterality Date   ANKLE SURGERY Right    APPENDECTOMY     BACK SURGERY  03/19/2021   CARPAL TUNNEL RELEASE Right    CERVICAL SPINE SURGERY     plates and pins   CHOLECYSTECTOMY N/A 06/26/2015   Procedure: LAPAROSCOPIC CHOLECYSTECTOMY;  Surgeon: Axel Filler, MD;  Location: [facility] OR;  Service: General;  Laterality: N/A;   COLONOSCOPY     COLONOSCOPY  2019   ESOPHAGOGASTRODUODENOSCOPY     ESOPHAGOGASTRODUODENOSCOPY N/A 12/04/2016   Procedure: ESOPHAGOGASTRODUODENOSCOPY (EGD);  Surgeon: Meryl Dare, MD;  Location[facility] ENDOSCOPY;  Service: Endoscopy;  Laterality: N/A;   LAPAROSCOPIC APPENDECTOMY N/A 06/26/2015   Procedure: LAPAROSCOPIC APPENDECTOMY;  Surgeon: Axel Filler, MD;  Location: [facility] OR;  Service: General;  Laterality: N/A;   ROTATOR CUFF REPAIR Right    SALPINGOOPHORECTOMY  Left   ectopic 1980   TOTAL SHOULDER ARTHROPLASTY     TRANSFORAMINAL LUMBAR INTERBODY FUSION (TLIF) WITH PEDICLE SCREW FIXATION 2 LEVEL N/A 01/28/2019   Procedure: TRANSFORAMINAL LUMBAR INTERBODY FUSION LUMBAR THREE-FOUR, LUMBAR FOUR-FIVE;  Surgeon: Tia Alert, MD;  Location: [facility] OR;  Service: Neurosurgery;  Laterality: N/A;  posterior   UPPER GASTROINTESTINAL ENDOSCOPY      Current Medications: No outpatient medications have been marked as taking for the 07/11/22 encounter (Appointment) with Chilton Si, MD.  Allergies:   Crestor [rosuvastatin calcium], Asa [aspirin], Dicyclomine, Topiramate, and Celebrex [celecoxib]   Social History   Socioeconomic History   Marital status: Single    Spouse name: Not on file    Number of children: 0   Years of education: 12   Highest education level: High school graduate  Occupational History   Occupation: retired  Tobacco Use   Smoking status: Never    Passive exposure: Never   Smokeless tobacco: Never  Vaping Use   Vaping Use: Never used  Substance and Sexual Activity   Alcohol use: No   Drug use: No   Sexual activity: Never  Other Topics Concern   Not on file  Social History Narrative   No children   Husband died 04-Aug-2002   Niece is staying with her   She enjoys bowling, lunch with friends/sister, cards, church   Has a boyfriend who has his own house   Helps family members with driving   Retired, Worked in Psychologist, 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  [place]   No daily use of caffeine.   Right-handed.   Lives alone.         Social Determinants of Health   Financial Resource Strain: Not on file  Food Insecurity: Not on file  Transportation Needs: Not on file  Physical Activity: Not on file  Stress: Not on file  Social Connections: Not on file     Family History: The patient's ***family history includes Cancer in her maternal grandmother; Cirrhosis in her father; Glaucoma in her brother and mother; Heart attack in her sister; Heart disease in her sister; Lung disease in her mother; Prostate cancer in her brother. There is no history of Kidney disease, Diabetes, Colon cancer, Colon polyps, Rectal cancer, or Stomach cancer.  ROS:   Please see the history of present illness.    *** All other systems reviewed and are negative.  EKGs/Labs/Other Studies Reviewed:    EKG:  EKG is *** ordered today.  The ekg ordered today demonstrates ***  Recent Labs: 03/31/2022: ALT 7; BUN 14; Creat 0.82; Potassium 4.0; Sodium 141 05/11/2022: Hemoglobin 10.7; Platelets 336.0   Recent Lipid Panel    Component Value Date/Time   CHOL 185 09/20/2013 1347   TRIG 170 (H) 09/20/2013 1347   HDL 39 (L) 09/20/2013 1347   CHOLHDL 4.7 09/20/2013  1347   VLDL 34 09/20/2013 1347   LDLCALC 112 (H) 09/20/2013 1347   LDLDIRECT 122.8 06/06/2012 1512    Physical Exam:   VS:  There were no vitals taken for this visit. , BMI There is no height or weight on file to calculate BMI. GENERAL:  Well appearing HEENT: Pupils equal round and reactive, fundi not visualized, oral mucosa unremarkable NECK:  No jugular venous distention, waveform within normal limits, carotid upstroke brisk and symmetric, no bruits, no thyromegaly LYMPHATICS:  No cervical adenopathy LUNGS:  Clear to auscultation bilaterally HEART:  RRR.  PMI not displaced or sustained,S1 and S2 within normal limits, no S3, no S4, no clicks, no rubs, *** murmurs ABD:  Flat, positive bowel sounds normal in frequency in pitch, no bruits, no rebound, no guarding, no midline pulsatile mass, no hepatomegaly, no splenomegaly EXT:  2 plus pulses throughout, no edema, no cyanosis no clubbing SKIN:  No rashes no nodules NEURO:  Cranial nerves II through XII grossly intact, motor grossly intact throughout PSYCH:  Cognitively intact, oriented to person place and time   ASSESSMENT/PLAN:    No problem-specific  Assessment & Plan notes found for this encounter.   Screening for Secondary Hypertension: { Click here to document screening for secondary causes of HTN  :[id number]}    Relevant Labs/Studies:    Latest Ref Rng & Units 03/31/2022    2:54 PM 09/28/2021    3:04 PM 06/23/2021    3:24 PM  Basic Labs  Sodium 135 - 146 mmol/L 141  142  144   Potassium 3.5 - 5.3 mmol/L 4.0  4.2  5.2   Creatinine 0.60 - 1.00 mg/dL 2.13  0.86  5.78        Latest Ref Rng & Units 01/14/2019    2:44 PM 12/03/2016   10:52 PM  Thyroid   TSH 0.450 - 4.500 uIU/mL 1.480  2.253       Disposition:    FU with MD/PharmD in {gen number [id number]} {Days to years:10300}    Medication Adjustments/Labs and Tests Ordered: Current medicines are reviewed at length with the patient today.  Concerns regarding medicines  are outlined above.  No orders of the defined types were placed in this encounter.  No orders of the defined types were placed in this encounter.    Signed, Chilton Si, MD  07/11/2022 8:02 AM    [facility]

## 2022-07-11 NOTE — Assessment & Plan Note (Signed)
Blood pressure is well-controlled.  It has been intermittently elevated to the 200s.  It does seem to fluctuate when she is having headaches.  She reports taking all her medications as prescribed.  On review of her records it appears that her amlodipine was last filled 08/2021.  Other medications are up-to-date.  For now, continue amlodipine, hydrochlorothiazide, losartan, and metoprolol.  She was given an advance hypertension clinic booklet and asked to track her blood pressures twice daily.  She will bring that to follow-up.  Check TSH, renin, aldosterone, catecholamines, and metanephrines.  Will also check renal artery Dopplers given that she has resistant hypertension.  We have asked that she bring her pill bottles with her to verify that she is actually getting all the medications that  are prescribed.  Continue to limit sodium intake.  Exercise is limited by chronic back pain.  Continue physical therapy.
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
## 2022-07-11 NOTE — Assessment & Plan Note (Signed)
Noted on imaging.  She will come back for fasting lipids and a CMP.  LDL goal is less than 70.  Continue rosuvastatin.  She is currently taking only once per week.  May need to increase the frequency.

## 2022-07-14 DIAGNOSIS — I10 Essential (primary) hypertension: Secondary | ICD-10-CM | POA: Diagnosis not present

## 2022-07-14 DIAGNOSIS — M533 Sacrococcygeal disorders, not elsewhere classified: Secondary | ICD-10-CM | POA: Diagnosis not present

## 2022-07-14 DIAGNOSIS — E785 Hyperlipidemia, unspecified: Secondary | ICD-10-CM | POA: Diagnosis not present

## 2022-08-01 LAB — COMPREHENSIVE METABOLIC PANEL
ALT: 16 IU/L (ref 0–32)
AST: 17 IU/L (ref 0–40)
Albumin/Globulin Ratio: 1.6 (ref 1.2–2.2)
Albumin: 4.5 g/dL (ref 3.8–4.8)
Alkaline Phosphatase: 110 IU/L (ref 44–121)
BUN/Creatinine Ratio: 15 (ref 12–28)
BUN: 16 mg/dL (ref 8–27)
Bilirubin Total: <0.2 mg/dL (ref 0.0–1.2)
CO2: 27 mmol/L (ref 20–29)
Calcium: 10.3 mg/dL (ref 8.7–10.3)
Chloride: 94 mmol/L — ABNORMAL LOW (ref 96–106)
Creatinine, Ser: 1.05 mg/dL — ABNORMAL HIGH (ref 0.57–1.00)
Globulin, Total: 2.9 g/dL (ref 1.5–4.5)
Glucose: 125 mg/dL — ABNORMAL HIGH (ref 70–99)
Potassium: 3.8 mmol/L (ref 3.5–5.2)
Sodium: 138 mmol/L (ref 134–144)
Total Protein: 7.4 g/dL (ref 6.0–8.5)
eGFR: 56 mL/min/{1.73_m2} — ABNORMAL LOW (ref >59)

## 2022-08-01 LAB — ALDOSTERONE + RENIN ACTIVITY W/ RATIO
Aldos/Renin Ratio: 5.6 (ref 0.0–30.0)
Aldosterone: 15.1 ng/dL (ref 0.0–30.0)
Renin Activity, Plasma: 2.685 ng/mL/hr (ref 0.167–5.380)

## 2022-08-01 LAB — LIPID PANEL
Chol/HDL Ratio: 4.1 ratio (ref 0.0–4.4)
Cholesterol, Total: 203 mg/dL — ABNORMAL HIGH (ref 100–199)
HDL: 49 mg/dL (ref >39)
LDL Chol Calc (NIH): 117 mg/dL — ABNORMAL HIGH (ref 0–99)
Triglycerides: 213 mg/dL — ABNORMAL HIGH (ref 0–149)
VLDL Cholesterol Cal: 37 mg/dL (ref 5–40)

## 2022-08-01 LAB — TSH: TSH: 4.25 u[IU]/mL (ref 0.450–4.500)

## 2022-08-01 LAB — CATECHOLAMINES, FRACTIONATED, PLASMA
Dopamine: 40 pg/mL (ref 0–48)
Epinephrine: <15 pg/mL (ref 0–62)
Norepinephrine: 1158 pg/mL — ABNORMAL HIGH (ref 0–874)

## 2022-08-01 LAB — METANEPHRINES, PLASMA
Metanephrine, Free: <25 pg/mL (ref 0.0–88.0)
Normetanephrine, Free: 132.6 pg/mL (ref 0.0–285.2)

## 2022-08-04 DIAGNOSIS — M533 Sacrococcygeal disorders, not elsewhere classified: Secondary | ICD-10-CM | POA: Diagnosis not present

## 2022-08-09 ENCOUNTER — Ambulatory Visit: Payer: PPO | Admitting: Internal Medicine

## 2022-08-10 ENCOUNTER — Telehealth (HOSPITAL_BASED_OUTPATIENT_CLINIC_OR_DEPARTMENT_OTHER): Payer: Self-pay | Admitting: *Deleted

## 2022-08-10 DIAGNOSIS — I10 Essential (primary) hypertension: Secondary | ICD-10-CM

## 2022-08-10 DIAGNOSIS — R825 Elevated urine levels of drugs, medicaments and biological substances: Secondary | ICD-10-CM

## 2022-08-10 NOTE — Telephone Encounter (Signed)
-----   Message from Chilton Si, MD sent at 08/09/2022  5:21 PM EDT ----- Norepinephrine levels are elevated.  Check 24 hour urine catecholamines collected on ice.  This will help confirm if it is truly abnormal.  LDL and triglycerides are elevated.  Thyroid is normal.  Renin and aldosterone are normal.

## 2022-08-10 NOTE — Telephone Encounter (Signed)
Advised patient, order placed  

## 2022-08-15 ENCOUNTER — Telehealth: Payer: Self-pay | Admitting: Neurology

## 2022-08-15 NOTE — Telephone Encounter (Signed)
Pt stated she needs some medication to take for her headaches during the day. Stated she wakes up with a headache most days.

## 2022-08-15 NOTE — Telephone Encounter (Signed)
Returned call to pt and she stated that she has been compliant to take gabapentinas directed but its not touching her back pain or headache pain. I advised her that if its for back she need to contact surgeon or pcp.  Pt stated that she has had 12 in the past month. She may need pain management or something since her pcp retired and to find new pcp. Routing to [place] np to advise.  Thanks,  Production assistant, radio

## 2022-08-16 NOTE — Telephone Encounter (Signed)
Patient added to wait list

## 2022-08-16 NOTE — Telephone Encounter (Signed)
Chart reviewed, in March 2024 we increased her Zonegran to 100 mg at bedtime.  Her blood pressure was very high.  Have her ensure her blood pressures are running normal.  You can move up her follow-up to discuss her headaches.  If she is waking with morning headaches suspicious for OSA, which is in he chart but not sure if she is using CPAP.  Also make sure not at risk for rebound headache with taking daily medication.  Would still follow-up with PCP or provider who manages her chronic neck/back pain.

## 2022-08-16 NOTE — Telephone Encounter (Signed)
Returned call to pt who stated her bp yesterday was perfect but feeling dizzy 118/73 pulse 64. Pt stated that she is waking up w/am Has, pt stated doesn't have cpap. Stated that she is taking gabapentin daily  1 pill q4hr due to HA and back pain. Advised to take 2 bid  of the 300mg  as bottle directed daily   but she stated that Dr. Yetta Barre filled it last at 600mg  q4hr. She stated that she does take zonegran at noc the 100mg  she wanted to ask could she take some of the 25mg  as well as the 100 since she has some at home. She statedalso that on occasion she has chest pain but she isn't actively having it right now. I did advise that if she begins to experience active chest pain that she needs to get to er and she voiced understanding. I tried to move appt sooner but didn't see anything available. Routing to sarah slack np to see what she recommends.

## 2022-08-16 NOTE — Telephone Encounter (Signed)
Let's add her to the wait list. Thanks

## 2022-08-17 ENCOUNTER — Ambulatory Visit (INDEPENDENT_AMBULATORY_CARE_PROVIDER_SITE_OTHER): Payer: PPO | Admitting: Pharmacist Clinician (PhC)/ Clinical Pharmacy Specialist

## 2022-08-17 ENCOUNTER — Other Ambulatory Visit: Payer: Self-pay | Admitting: Physician Assistant

## 2022-08-17 ENCOUNTER — Encounter (HOSPITAL_BASED_OUTPATIENT_CLINIC_OR_DEPARTMENT_OTHER): Payer: Self-pay | Admitting: Pharmacist Clinician (PhC)/ Clinical Pharmacy Specialist

## 2022-08-17 VITALS — BP 135/84 | HR 64 | Ht 65.5 in | Wt 176.8 lb

## 2022-08-17 DIAGNOSIS — I10 Essential (primary) hypertension: Secondary | ICD-10-CM

## 2022-08-17 NOTE — Patient Instructions (Signed)
Follow up appointment: July 24 at 2 pm  Go to the lab to get urine collection bottle  Take your BP meds as follows:  AM:  losartan 100 mg, hydrochlorothiazide 50 mg  PM:  amlodipine 10 mg, metoprolol 200 mg  Check your blood pressure at home daily (if able) and keep record of the readings.  Hypertension "High blood pressure"  Hypertension is often called "The Silent Killer." It rarely causes symptoms until it is extremely  high or has done damage to other organs in the body. For this reason, you should have your  blood pressure checked regularly by your physician. We will check your blood pressure  every time you see a provider at one of our offices.   Your blood pressure reading consists of two numbers. Ideally, blood pressure should be  below 120/80. The first ("top") number is called the systolic pressure. It measures the  pressure in your arteries as your heart beats. The second ("bottom") number is called the diastolic pressure. It measures the pressure in your arteries as the heart relaxes between beats.  The benefits of getting your blood pressure under control are enormous. A 10-point  reduction in systolic blood pressure can reduce your risk of stroke by 27% and heart failure by 28%  Your blood pressure goal is < 130/80  To check your pressure at home you will need to:  1. Sit up in a chair, with feet flat on the floor and back supported. Do not cross your ankles or legs. 2. Rest your left arm so that the cuff is about heart level. If the cuff goes on your upper arm,  then just relax the arm on the table, arm of the chair or your lap. If you have a wrist cuff, we  suggest relaxing your wrist against your chest (think of it as Pledging the Flag with the  wrong arm).  3. Place the cuff snugly around your arm, about 1 inch above the crook of your elbow. The  cords should be inside the groove of your elbow.  4. Sit quietly, with the cuff in place, for about 5 minutes.  After that 5 minutes press the power  button to start a reading. 5. Do not talk or move while the reading is taking place.  6. Record your readings on a sheet of paper. Although most cuffs have a memory, it is often  easier to see a pattern developing when the numbers are all in front of you.  7. You can repeat the reading after 1-3 minutes if it is recommended  Make sure your bladder is empty and you have not had caffeine or tobacco within the last 30 min  Always bring your blood pressure log with you to your appointments. If you have not brought your monitor in to be double checked for accuracy, please bring it to your next appointment.  You can find a list of quality blood pressure cuffs at validatebp.org

## 2022-08-17 NOTE — Progress Notes (Unsigned)
Office Visit    Patient Name: Tammy Mullins Date of Encounter: 08/18/2022  Primary Care Provider:  Lazarus Salines, MD (Inactive) Primary Cardiologist:  None  Chief Complaint    Hypertension - Advanced hypertension clinic  Past Medical History   HLD 5/24 LDL 117 - on rosuvastatin 5  DM2 1/23 A1c 6.6 - no current medications  OSA   headaches Intermittent, BP was 250/200 at neuro with severe headache    Allergies  Allergen Reactions   Crestor [Rosuvastatin Calcium] Other (See Comments)    Causes severe body aches   Asa [Aspirin]     GI upset in hight doses can take a 81mg    Dicyclomine Other (See Comments)    Body and face rash, swelling.   Topiramate Diarrhea   Celebrex [Celecoxib] Swelling and Rash    Swelling in legs    History of Present Illness    Tammy Mullins is a 72 y.o. female patient who was referred to the [facility] by Sanda Klein PA.  She reports that her pressure is quite labile her PCP referred her to [place].  Dr. Duke Salvia saw her last month, and her in-office reading was at 105/70.   She was asked to bring all her medication bottles for verification as well as home BP reading to a follow up appointment.  She returns today for follow up.  A secondary work up showed normal renin/aldosterone, but she did have elevated norepinephrine.  She was asked to do a 24 hour urine catecholamines.  Renal US has been scheduled for later this month.    Blood Pressure Goal:  130/80  Current Medications: amlodipine 10 mg every day, hctz 50 mg every day, losartan 100 mg every day, metoprolol succ 200 mg every day - takes all medications in the mornings  Family Hx:   parents and siblings all had hypertension, 1 sister with CABG, PAD, now deceased;   Social Hx:      Tobacco: no  Alcohol: no  Caffeine: occasional  Diet:   more home cooked meals, no fried foods; likes [place] and chicken, some fish; not much red meat; likes vegetables, no added  salt  Exercise: limited by chronic back pain/rods - currently in PT; walks in neighborhood  Home BP readings:    only has readings from past week 6 readings averaging 132/76 (range 105-165/66-84)   Accessory Clinical Findings    Lab Results  Component Value Date   CREATININE 1.05 (H) 07/14/2022   BUN 16 07/14/2022   NA 138 07/14/2022   K 3.8 07/14/2022   CL 94 (L) 07/14/2022   CO2 27 07/14/2022   Lab Results  Component Value Date   ALT 16 07/14/2022   AST 17 07/14/2022   ALKPHOS 110 07/14/2022   BILITOT <0.2 07/14/2022   Lab Results  Component Value Date   HGBA1C 6.6 (H) 03/17/2021    Screening for Secondary Hypertension:      07/11/2022   11:16 AM  Causes  Drugs/Herbals Screened     - Comments limits salt, occasional caffeine, no EtOH, no tobacco  Sleep Apnea Screened    Relevant Labs/Studies:    Latest Ref Rng & Units 07/14/2022   12:02 PM 03/31/2022    2:54 PM 09/28/2021    3:04 PM  Basic Labs  Sodium 134 - 144 mmol/L 138  141  142   Potassium 3.5 - 5.2 mmol/L 3.8  4.0  4.2   Creatinine 0.57 - 1.00 mg/dL 4.09  8.11  9.14  Latest Ref Rng & Units 07/14/2022   12:02 PM 01/14/2019    2:44 PM  Thyroid   TSH 0.450 - 4.500 uIU/mL 4.250  1.480        Latest Ref Rng & Units 07/14/2022   12:02 PM  Renin/Aldosterone   Aldosterone 0.0 - 30.0 ng/dL 16.1   Aldos/Renin Ratio 0.0 - 30.0 5.6        Latest Ref Rng & Units 07/14/2022   12:02 PM  Metanephrines/Catecholamines   Epinephrine 0 - 62 pg/mL <15   Norepinephrine 0 - 874 pg/mL 1,158   Dopamine 0 - 48 pg/mL 40   Metanephrines 0.0 - 88.0 pg/mL <25.0   Normetanephrines  0.0 - 285.2 pg/mL 132.6           07/11/2022   11:34 AM  Renovascular   Renal Artery [place] Completed Yes      Home Medications    Current Outpatient Medications  Medication Sig Dispense Refill   allopurinol (ZYLOPRIM) 300 MG tablet TAKE 1 AND 1/2 TABLETS(450 MG) BY MOUTH DAILY 45 tablet 2   amLODipine (NORVASC) 10 MG tablet TAKE 1  TABLET DAILY 90 tablet 1   buPROPion (WELLBUTRIN XL) 300 MG 24 hr tablet Take 1 tablet (300 mg total) by mouth in the morning. 90 tablet 1   DULoxetine (CYMBALTA) 60 MG capsule TAKE 2 CAPSULES(120 MG) BY MOUTH DAILY 180 capsule 1   famotidine (PEPCID) 40 MG tablet Take 40 mg by mouth daily.     ferrous sulfate 325 (65 FE) MG tablet Take 325 mg by mouth 3 (three) times daily with meals.     gabapentin (NEURONTIN) 300 MG capsule Take 2 capsules (600 mg total) by mouth 3 (three) times daily. 180 capsule 1   hydrochlorothiazide (HYDRODIURIL) 50 MG tablet Take 50 mg by mouth daily.     hyoscyamine (LEVSIN SL) 0.125 MG SL tablet SMARTSIG:1-2 Tablet(s) Sublingual Every 4-6 Hours PRN     losartan (COZAAR) 100 MG tablet 1 tablet by mouth every morning     methocarbamol (ROBAXIN) 750 MG tablet Take 750 mg by mouth every 6 (six) hours as needed for muscle spasms.     metoprolol (TOPROL-XL) 200 MG 24 hr tablet Take 200 mg by mouth daily.     Multiple Vitamins-Minerals (MULTI COMPLETE/IRON) TABS Take 1 tablet by mouth daily at 6 (six) AM.     omeprazole (PRILOSEC) 40 MG capsule Take 40 mg by mouth daily.     QUEtiapine (SEROQUEL) 400 MG tablet TAKE 1 TABLET(400 MG) BY MOUTH AT BEDTIME 90 tablet 1   rosuvastatin (CRESTOR) 5 MG tablet Take 5 mg by mouth once a week.     zonisamide (ZONEGRAN) 100 MG capsule 1 capsule Orally at bedtime as needed     Blood Glucose Monitoring Suppl (ONETOUCH VERIO FLEX SYSTEM) w/Device KIT Use as directed (Patient not taking: Reported on 08/17/2022)     glucose blood (ONETOUCH VERIO) test strip Use as instructed (Patient not taking: Reported on 08/17/2022)     Lancets (ONETOUCH ULTRASOFT) lancets Use as instructed (Patient not taking: Reported on 08/17/2022)     No current facility-administered medications for this visit.     Assessment & Plan       Essential hypertension Assessment: BP is uncontrolled in office BP 135/84 mmHg;  above the goal (<130/80). Currently takes all  4 medications in the morning, Tolerates all medications well without any side effects Denies SOB, palpitation, chest pain, headaches,or swelling Reiterated the importance of regular exercise and  low salt diet   Plan:  Divide medications for better 24 hour balance - take losartan and hctz in the mornings, amlodipine and metoprolol at night Patient to keep record of BP readings with heart rate and report to [place] at the next visit Patient to follow up with PharmD in 1 month  Labs ordered today:  none   Phillips Hay PharmD CPP [facility]  [address] [place], [place] [address] [phone]

## 2022-08-18 NOTE — Assessment & Plan Note (Signed)
Assessment: BP is uncontrolled in office BP 135/84 mmHg;  above the goal (<130/80). Currently takes all 4 medications in the morning, Tolerates all medications well without any side effects Denies SOB, palpitation, chest pain, headaches,or swelling Reiterated the importance of regular exercise and low salt diet   Plan:  Divide medications for better 24 hour balance - take losartan and hctz in the mornings, amlodipine and metoprolol at night Patient to keep record of BP readings with heart rate and report to [place] at the next visit Patient to follow up with PharmD in 1 month  Labs ordered today:  none

## 2022-08-19 DIAGNOSIS — I10 Essential (primary) hypertension: Secondary | ICD-10-CM | POA: Diagnosis not present

## 2022-08-19 DIAGNOSIS — R825 Elevated urine levels of drugs, medicaments and biological substances: Secondary | ICD-10-CM | POA: Diagnosis not present

## 2022-08-24 ENCOUNTER — Encounter (HOSPITAL_BASED_OUTPATIENT_CLINIC_OR_DEPARTMENT_OTHER): Payer: PPO

## 2022-08-24 LAB — CATECHOLAMINES, FRACTIONATED, URINE, 24 HOUR
Dopamine , 24H Ur: 108 ug/24 hr (ref 0–510)
Dopamine, Rand Ur: 36 ug/L
Epinephrine, 24H Ur: <9 ug/24 hr (ref 0–20)
Epinephrine, Rand Ur: <3 ug/L
Norepinephrine, 24H Ur: 51 ug/24 hr (ref 0–135)
Norepinephrine, Rand Ur: 17 ug/L

## 2022-08-25 DIAGNOSIS — M533 Sacrococcygeal disorders, not elsewhere classified: Secondary | ICD-10-CM | POA: Diagnosis not present

## 2022-08-25 DIAGNOSIS — Z6829 Body mass index (BMI) 29.0-29.9, adult: Secondary | ICD-10-CM | POA: Diagnosis not present

## 2022-09-03 ENCOUNTER — Other Ambulatory Visit: Payer: Self-pay | Admitting: Physician Assistant

## 2022-09-06 ENCOUNTER — Ambulatory Visit: Payer: PPO | Admitting: Neurology

## 2022-09-13 ENCOUNTER — Other Ambulatory Visit: Payer: Self-pay | Admitting: Family Medicine

## 2022-09-13 ENCOUNTER — Inpatient Hospital Stay: Admission: RE | Admit: 2022-09-13 | Payer: PPO | Source: Ambulatory Visit

## 2022-09-13 DIAGNOSIS — E2839 Other primary ovarian failure: Secondary | ICD-10-CM
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
## 2022-09-16 ENCOUNTER — Telehealth: Payer: Self-pay | Admitting: Cardiovascular Disease

## 2022-09-16 NOTE — Telephone Encounter (Signed)
Patient is needing a call back to reschedule appt for 07/24 with pharmacist.

## 2022-09-16 NOTE — Telephone Encounter (Signed)
Returned call to patient, 7/24 appointment cancelled and rescheduled with PharmD at [facility]

## 2022-09-20 DIAGNOSIS — K5090 Crohn's disease, unspecified, without complications: Secondary | ICD-10-CM | POA: Diagnosis not present

## 2022-09-20 DIAGNOSIS — G43909 Migraine, unspecified, not intractable, without status migrainosus: Secondary | ICD-10-CM | POA: Diagnosis not present

## 2022-09-20 DIAGNOSIS — D509 Iron deficiency anemia, unspecified: Secondary | ICD-10-CM | POA: Diagnosis not present

## 2022-09-20 DIAGNOSIS — E663 Overweight: Secondary | ICD-10-CM | POA: Diagnosis not present

## 2022-09-20 DIAGNOSIS — G629 Polyneuropathy, unspecified: Secondary | ICD-10-CM | POA: Diagnosis not present

## 2022-09-20 DIAGNOSIS — I10 Essential (primary) hypertension: Secondary | ICD-10-CM | POA: Diagnosis not present

## 2022-09-20 DIAGNOSIS — F411 Generalized anxiety disorder: Secondary | ICD-10-CM | POA: Diagnosis not present

## 2022-09-20 DIAGNOSIS — F319 Bipolar disorder, unspecified: Secondary | ICD-10-CM | POA: Diagnosis not present

## 2022-09-20 DIAGNOSIS — G8929 Other chronic pain: Secondary | ICD-10-CM | POA: Diagnosis not present

## 2022-09-20 DIAGNOSIS — E785 Hyperlipidemia, unspecified: Secondary | ICD-10-CM | POA: Diagnosis not present

## 2022-09-20 DIAGNOSIS — I2510 Atherosclerotic heart disease of native coronary artery without angina pectoris: Secondary | ICD-10-CM | POA: Diagnosis not present

## 2022-09-20 DIAGNOSIS — K5190 Ulcerative colitis, unspecified, without complications: Secondary | ICD-10-CM | POA: Diagnosis not present

## 2022-09-21 ENCOUNTER — Ambulatory Visit (HOSPITAL_BASED_OUTPATIENT_CLINIC_OR_DEPARTMENT_OTHER): Payer: PPO

## 2022-09-26 ENCOUNTER — Ambulatory Visit (INDEPENDENT_AMBULATORY_CARE_PROVIDER_SITE_OTHER): Payer: PPO

## 2022-09-26 DIAGNOSIS — I10 Essential (primary) hypertension: Secondary | ICD-10-CM | POA: Diagnosis not present

## 2022-09-28 DIAGNOSIS — F324 Major depressive disorder, single episode, in partial remission: Secondary | ICD-10-CM | POA: Diagnosis not present

## 2022-09-28 DIAGNOSIS — E1169 Type 2 diabetes mellitus with other specified complication: Secondary | ICD-10-CM | POA: Diagnosis not present

## 2022-09-28 DIAGNOSIS — M109 Gout, unspecified: Secondary | ICD-10-CM | POA: Diagnosis not present

## 2022-09-28 DIAGNOSIS — E7800 Pure hypercholesterolemia, unspecified: Secondary | ICD-10-CM | POA: Diagnosis not present

## 2022-09-28 DIAGNOSIS — K589 Irritable bowel syndrome without diarrhea: Secondary | ICD-10-CM | POA: Diagnosis not present

## 2022-09-28 DIAGNOSIS — G44209 Tension-type headache, unspecified, not intractable: Secondary | ICD-10-CM | POA: Diagnosis not present

## 2022-09-28 DIAGNOSIS — K219 Gastro-esophageal reflux disease without esophagitis: Secondary | ICD-10-CM | POA: Diagnosis not present

## 2022-09-28 DIAGNOSIS — E1136 Type 2 diabetes mellitus with diabetic cataract: Secondary | ICD-10-CM | POA: Diagnosis not present

## 2022-09-28 DIAGNOSIS — M5440 Lumbago with sciatica, unspecified side: Secondary | ICD-10-CM | POA: Diagnosis not present

## 2022-09-28 DIAGNOSIS — D509 Iron deficiency anemia, unspecified: Secondary | ICD-10-CM | POA: Diagnosis not present

## 2022-09-28 DIAGNOSIS — I10 Essential (primary) hypertension: Secondary | ICD-10-CM | POA: Diagnosis not present

## 2022-09-28 LAB — LAB REPORT - SCANNED
Microalb Creat Ratio: 12.1
Microalbumin, Urine: 1.29

## 2022-09-29 ENCOUNTER — Ambulatory Visit: Payer: PPO | Admitting: Physician Assistant

## 2022-09-29 DIAGNOSIS — F32A Depression, unspecified: Secondary | ICD-10-CM

## 2022-09-29 DIAGNOSIS — M5136 Other intervertebral disc degeneration, lumbar region: Secondary | ICD-10-CM

## 2022-09-29 DIAGNOSIS — M5030 Other cervical disc degeneration, unspecified cervical region: Secondary | ICD-10-CM

## 2022-09-29 DIAGNOSIS — M8589 Other specified disorders of bone density and structure, multiple sites: Secondary | ICD-10-CM

## 2022-09-29 DIAGNOSIS — M170 Bilateral primary osteoarthritis of knee: Secondary | ICD-10-CM

## 2022-09-29 DIAGNOSIS — F5101 Primary insomnia: Secondary | ICD-10-CM

## 2022-09-29 DIAGNOSIS — R5383 Other fatigue: Secondary | ICD-10-CM

## 2022-09-29 DIAGNOSIS — M1A09X0 Idiopathic chronic gout, multiple sites, without tophus (tophi): Secondary | ICD-10-CM

## 2022-09-29 DIAGNOSIS — G8929 Other chronic pain: Secondary | ICD-10-CM

## 2022-09-29 DIAGNOSIS — S6991XA Unspecified injury of right wrist, hand and finger(s), initial encounter: Secondary | ICD-10-CM

## 2022-09-29 DIAGNOSIS — Z5181 Encounter for therapeutic drug level monitoring: Secondary | ICD-10-CM

## 2022-09-29 DIAGNOSIS — M797 Fibromyalgia: Secondary | ICD-10-CM

## 2022-09-29 DIAGNOSIS — G894 Chronic pain syndrome: Secondary | ICD-10-CM

## 2022-09-29 DIAGNOSIS — Z8719 Personal history of other diseases of the digestive system: Secondary | ICD-10-CM

## 2022-09-29 DIAGNOSIS — Z8679 Personal history of other diseases of the circulatory system: Secondary | ICD-10-CM

## 2022-09-29 DIAGNOSIS — M19041 Primary osteoarthritis, right hand: Secondary | ICD-10-CM
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
## 2022-10-03 ENCOUNTER — Ambulatory Visit: Payer: PPO | Attending: Physician Assistant | Admitting: Physician Assistant

## 2022-10-03 ENCOUNTER — Encounter: Payer: Self-pay | Admitting: Physician Assistant

## 2022-10-03 VITALS — BP 120/78 | HR 70 | Resp 15 | Ht 65.5 in | Wt 182.4 lb

## 2022-10-03 DIAGNOSIS — Z8679 Personal history of other diseases of the circulatory system: Secondary | ICD-10-CM

## 2022-10-03 DIAGNOSIS — R5383 Other fatigue: Secondary | ICD-10-CM

## 2022-10-03 DIAGNOSIS — Z8719 Personal history of other diseases of the digestive system: Secondary | ICD-10-CM

## 2022-10-03 DIAGNOSIS — M25511 Pain in right shoulder: Secondary | ICD-10-CM | POA: Diagnosis not present

## 2022-10-03 DIAGNOSIS — M8589 Other specified disorders of bone density and structure, multiple sites: Secondary | ICD-10-CM

## 2022-10-03 DIAGNOSIS — M19041 Primary osteoarthritis, right hand: Secondary | ICD-10-CM | POA: Diagnosis not present

## 2022-10-03 DIAGNOSIS — M797 Fibromyalgia: Secondary | ICD-10-CM | POA: Diagnosis not present

## 2022-10-03 DIAGNOSIS — Z5181 Encounter for therapeutic drug level monitoring: Secondary | ICD-10-CM

## 2022-10-03 DIAGNOSIS — S6991XA Unspecified injury of right wrist, hand and finger(s), initial encounter: Secondary | ICD-10-CM | POA: Diagnosis not present

## 2022-10-03 DIAGNOSIS — G8929 Other chronic pain: Secondary | ICD-10-CM

## 2022-10-03 DIAGNOSIS — F5101 Primary insomnia: Secondary | ICD-10-CM | POA: Diagnosis not present

## 2022-10-03 DIAGNOSIS — G894 Chronic pain syndrome: Secondary | ICD-10-CM

## 2022-10-03 DIAGNOSIS — M170 Bilateral primary osteoarthritis of knee: Secondary | ICD-10-CM | POA: Diagnosis not present

## 2022-10-03 DIAGNOSIS — M5136 Other intervertebral disc degeneration, lumbar region: Secondary | ICD-10-CM | POA: Diagnosis not present

## 2022-10-03 DIAGNOSIS — F419 Anxiety disorder, unspecified: Secondary | ICD-10-CM

## 2022-10-03 DIAGNOSIS — M1A09X0 Idiopathic chronic gout, multiple sites, without tophus (tophi): Secondary | ICD-10-CM | POA: Diagnosis not present

## 2022-10-03 DIAGNOSIS — M5030 Other cervical disc degeneration, unspecified cervical region: Secondary | ICD-10-CM | POA: Diagnosis not present

## 2022-10-03 DIAGNOSIS — F32A Depression, unspecified: Secondary | ICD-10-CM

## 2022-10-03 DIAGNOSIS — M51369 Other intervertebral disc degeneration, lumbar region without mention of lumbar back pain or lower extremity pain: Secondary | ICD-10-CM

## 2022-10-03 DIAGNOSIS — M19042 Primary osteoarthritis, left hand: Secondary | ICD-10-CM

## 2022-10-03 NOTE — Patient Instructions (Signed)

## 2022-10-04 NOTE — Progress Notes (Signed)
Uric acid within the desirable range.  No medication changes recommended at this time.

## 2022-10-05 ENCOUNTER — Telehealth: Payer: Self-pay | Admitting: *Deleted

## 2022-10-05 NOTE — Telephone Encounter (Signed)
Labs received from:[facility]  Drawn on:09/28/2022  Reviewed by:Sherron Ales, PA-C  Labs drawn: Hgb A1C, CBC, CMP, Ferritin, Microalbumin Panel. Lipid Panel  Results: Hgb A1C 5.9     RBC 3.12    Hgb 9.6    Hct 30.1    MCHC 31.7    RDW 16.7    Neut% 77.2    LYMPH% 16.2    MONO% 4.5    NEUT # 8.1    Glucose 108    GFR 60    T. Bili 0.2  Patient is on Allopurinol 450 mg po daily.

## 2022-10-07 ENCOUNTER — Ambulatory Visit: Payer: PPO

## 2022-10-11 ENCOUNTER — Telehealth: Payer: Self-pay | Admitting: Neurology

## 2022-10-11 ENCOUNTER — Telehealth: Payer: Self-pay | Admitting: Internal Medicine

## 2022-10-11 NOTE — Telephone Encounter (Signed)
LVM and sent mychart msg informing pt of need to reschedule 11/24/22 appt - NP out

## 2022-10-11 NOTE — Telephone Encounter (Signed)
Inbound call from patient states she is still experiencing abdominal pain and would like to be prescribed a medication. Please advise.

## 2022-10-12 ENCOUNTER — Encounter: Payer: Self-pay | Admitting: Physician Assistant

## 2022-10-12 ENCOUNTER — Ambulatory Visit (INDEPENDENT_AMBULATORY_CARE_PROVIDER_SITE_OTHER): Payer: PPO | Admitting: Physician Assistant

## 2022-10-12 DIAGNOSIS — G894 Chronic pain syndrome: Secondary | ICD-10-CM

## 2022-10-12 DIAGNOSIS — F99 Mental disorder, not otherwise specified: Secondary | ICD-10-CM

## 2022-10-12 DIAGNOSIS — R5383 Other fatigue: Secondary | ICD-10-CM | POA: Diagnosis not present

## 2022-10-12 DIAGNOSIS — F3181 Bipolar II disorder: Secondary | ICD-10-CM

## 2022-10-12 DIAGNOSIS — F32A Depression, unspecified: Secondary | ICD-10-CM | POA: Diagnosis not present

## 2022-10-12 DIAGNOSIS — F5105 Insomnia due to other mental disorder: Secondary | ICD-10-CM | POA: Diagnosis not present

## 2022-10-12 MED ORDER — BUPROPION HCL ER (XL) 150 MG PO TB24: 450.0000 mg | ORAL_TABLET | Freq: Every day | ORAL | 1 refills | Status: DC

## 2022-10-12 NOTE — Telephone Encounter (Signed)
Pt stated that she started having symptoms on Monday with abdominal pain along with Abdominal sweeling. Pt stated that she had three BM yesterday. Pt stated that she has had no diarrhea, vomiting, or fever. Pt stated that she is feeling better today. No abdominal pain nor swelling.  Pt was recommended to call us back if her symptoms return.  Pt verbalized understanding with all questions answered.

## 2022-10-12 NOTE — Progress Notes (Signed)
[facility]  Patient ID: Arthea Lain,  MRN: [id number]  PCP: Ceasar Lund, PA  Date of Evaluation:10/12/2022 Time spent:25 minutes  Chief Complaint:  Chief Complaint   Anxiety; Depression; Follow-up    Virtual Visit via Telehealth  I connected with patient by telephone, with their informed consent, and verified patient privacy and that I am speaking with the correct person using two identifiers.  I am private, in my office and the patient is at home.  I discussed the limitations, risks, security and privacy concerns of performing an evaluation and management service by telephone and the availability of in person appointments. I also discussed with the patient that there may be a patient responsible charge related to this service. The patient expressed understanding and agreed to proceed.   I discussed the assessment and treatment plan with the patient. The patient was provided an opportunity to ask questions and all were answered. The patient agreed with the plan and demonstrated an understanding of the instructions.   The patient was advised to call back or seek an in-person evaluation if the symptoms worsen or if the condition fails to improve as anticipated.  I provided 25  minutes of non-face-to-face time during this encounter.  HISTORY/CURRENT STATUS:  HPI For routine med check.   Not feeling as well as she did. Doesn't want to go out of the house, that's unusual for her. Is comfortable staying at home.  Energy and motivation are fair.   No feelings of hopelessness but she feels sad for no reason.  Sleeps well most of the time. ADLs and personal hygiene are normal.   Denies changes in remembering things.  Appetite has not changed.  Weight is stable.  No complaints of anxiety.  Denies suicidal or homicidal thoughts.  Patient denies increased energy with decreased need for sleep, increased talkativeness, racing thoughts, impulsivity or risky behaviors, increased  spending, increased libido, grandiosity, increased irritability or anger, paranoia, or hallucinations.  Denies dizziness, syncope, seizures, numbness, tingling, tremor, tics, unsteady gait, slurred speech, confusion. Denies muscle or joint pain, stiffness, or dystonia. Denies unexplained weight loss, frequent infections, or sores that heal slowly.  No polyphagia, polydipsia, or polyuria. Denies visual changes or paresthesias.   Individual Medical History/ Review of Systems: Changes? :Yes    colon polyps removed, has FeSO4 anemia  Past medications for mental health diagnoses include: Cymbalta, Seroquel, Gabapentin, Wellbutrin  Allergies: Crestor [rosuvastatin calcium], Asa [aspirin], Dicyclomine, Topiramate, and Celebrex [celecoxib]  Current Medications:  Current Outpatient Medications:    allopurinol (ZYLOPRIM) 300 MG tablet, TAKE 1 AND 1/2 TABLETS(450 MG) BY MOUTH DAILY, Disp: 45 tablet, Rfl: 2   amLODipine (NORVASC) 10 MG tablet, TAKE 1 TABLET DAILY, Disp: 90 tablet, Rfl: 1   buPROPion (WELLBUTRIN XL) 150 MG 24 hr tablet, Take 3 tablets (450 mg total) by mouth daily., Disp: 90 tablet, Rfl: 1   DULoxetine (CYMBALTA) 60 MG capsule, TAKE 2 CAPSULES(120 MG) BY MOUTH DAILY, Disp: 180 capsule, Rfl: 1   famotidine (PEPCID) 40 MG tablet, Take 40 mg by mouth daily., Disp: , Rfl:    ferrous sulfate 325 (65 FE) MG tablet, Take 325 mg by mouth 3 (three) times daily with meals., Disp: , Rfl:    gabapentin (NEURONTIN) 300 MG capsule, Take 2 capsules (600 mg total) by mouth 3 (three) times daily., Disp: 180 capsule, Rfl: 1   hydrochlorothiazide (HYDRODIURIL) 50 MG tablet, Take 50 mg by mouth daily., Disp: , Rfl:    hyoscyamine (LEVSIN SL) 0.125 MG  SL tablet, SMARTSIG:1-2 Tablet(s) Sublingual Every 4-6 Hours PRN, Disp: , Rfl:    losartan (COZAAR) 100 MG tablet, 1 tablet by mouth every morning, Disp: , Rfl:    methocarbamol (ROBAXIN) 750 MG tablet, Take 750 mg by mouth every 6 (six) hours as needed for  muscle spasms., Disp: , Rfl:    metoprolol (TOPROL-XL) 200 MG 24 hr tablet, Take 200 mg by mouth daily., Disp: , Rfl:    Multiple Vitamins-Minerals (MULTI COMPLETE/IRON) TABS, Take 1 tablet by mouth daily at 6 (six) AM., Disp: , Rfl:    omeprazole (PRILOSEC) 40 MG capsule, Take 40 mg by mouth daily., Disp: , Rfl:    QUEtiapine (SEROQUEL) 400 MG tablet, TAKE 1 TABLET(400 MG) BY MOUTH AT BEDTIME, Disp: 90 tablet, Rfl: 1   rosuvastatin (CRESTOR) 5 MG tablet, Take 5 mg by mouth once a week., Disp: , Rfl:    zonisamide (ZONEGRAN) 100 MG capsule, 1 capsule Orally at bedtime as needed, Disp: , Rfl:    Blood Glucose Monitoring Suppl (ONETOUCH VERIO FLEX SYSTEM) w/Device KIT, Use as directed (Patient not taking: Reported on 08/17/2022), Disp: , Rfl:    glucose blood (ONETOUCH VERIO) test strip, Use as instructed (Patient not taking: Reported on 08/17/2022), Disp: , Rfl:    Lancets (ONETOUCH ULTRASOFT) lancets, Use as instructed (Patient not taking: Reported on 08/17/2022), Disp: , Rfl:  Medication Side Effects: none  Family Medical/ Social History: Changes?  No  MENTAL HEALTH EXAM:  There were no vitals taken for this visit.There is no height or weight on file to calculate BMI.  General Appearance:  unable to assess  Eye Contact:   unable to assess  Speech:  Clear and Coherent, Normal Rate, and Talkative  Volume:  Normal  Mood:  Euthymic  Affect:   unable to assess  Thought Process:  Goal Directed and Descriptions of Associations: Intact  Orientation:  Full (Time, Place, and Person)  Thought Content: Logical   Suicidal Thoughts:  No  Homicidal Thoughts:  No  Memory:  WNL  Judgement:  Good  Insight:  Good  Psychomotor Activity:   unable to assess  Concentration:  Concentration: Good and Attention Span: Good  Recall:  Good  Fund of Knowledge: Good  Language: Good  Assets:  Desire for Improvement Financial Resources/Insurance Housing Transportation  ADL's:  Intact  Cognition: WNL   Prognosis:  Good   Labs from 2024 reviewed (see on chart)  DIAGNOSES:    ICD-10-CM   1. Melancholy  F32.A     2. Bipolar 2 disorder (HCC)  F31.81     3. Insomnia due to other mental disorder  F51.05    F99     4. Chronic pain syndrome  G89.4     5. Fatigue, unspecified type  R53.83       Receiving Psychotherapy: No   RECOMMENDATIONS:  PDMP was reviewed. Gabapentin filled 09/23/2022. I provided 25 minutes of non-face-to-face time during this encounter, including time spent before and after the visit in records review, medical decision making, counseling pertinent to today's visit, and charting.   We discussed the melancholy that she is feeling.  Agreed to increase Wellbutrin.  Pros and cons of that were discussed and she would like to proceed.  Increase Wellbutrin XL to 450 mg every morning. Continue Cymbalta 60 mg, 2 p.o. daily. Continue Seroquel 400 mg, 1 p.o. nightly. Continue gabapentin 300 mg, 2 p.o. 3 times daily. (For pain) Return in 6 weeks.  Melony Overly, PA-C

## 2022-10-20 ENCOUNTER — Ambulatory Visit: Payer: PPO

## 2022-11-01 ENCOUNTER — Telehealth: Payer: Self-pay | Admitting: Rheumatology

## 2022-11-01 ENCOUNTER — Ambulatory Visit: Payer: PPO | Attending: Cardiovascular Disease

## 2022-11-01 NOTE — Telephone Encounter (Signed)
Pt is wanting a shot to help her shoulder and would like to know if she would be able to get it by Dr. Corliss Skains or Ladona Ridgel. Pt can not remember what shot it was and if it was Dr. Corliss Skains that gave her a shot before. Pt said she remembers Dr Corliss Skains talking about a cream also that would help her pain but she does not remember the name or where to get it.

## 2022-11-01 NOTE — Progress Notes (Deleted)
Office Visit    Patient Name: Tammy Mullins Date of Encounter: 11/01/2022  Primary Care Provider:  Ceasar Lund, PA Primary Cardiologist:  None  Chief Complaint    Hypertension - [facility] hypertension clinic  Past Medical History   HLD 5/24 LDL 117 - on rosuvastatin 5  DM2 1/23 A1c 6.6 - no current medications  OSA   headaches Intermittent, BP was 250/200 at neuro with severe headache    Allergies  Allergen Reactions   Crestor [Rosuvastatin Calcium] Other (See Comments)    Causes severe body aches   Asa [Aspirin]     GI upset in hight doses can take a 81mg    Dicyclomine Other (See Comments)    Body and face rash, swelling.   Topiramate Diarrhea   Celebrex [Celecoxib] Swelling and Rash    Swelling in legs    History of Present Illness    Tammy Mullins is a 72 y.o. female patient who was referred to the [facility] by Sanda Klein PA.  She reports that her pressure is quite labile her PCP referred her to [place].  Dr. Duke Salvia saw her last month, and her in-office reading was at 105/70.   She was asked to bring all her medication bottles for verification as well as home BP reading to a follow up appointment.   A secondary work up showed normal renin/aldosterone, but she did have elevated norepinephrine.  She was asked to do a 24 hour urine catecholamines, which was fine.  Renal US showed no indication for RAS.   At her last visit we did not change any medications, but asked that she divide them and take amlodipine and metoprolol at night, with losartan and hctz in the mornings.    Today she returns for follow up.   Blood Pressure Goal:  130/80  Current Medications: amlodipine 10 mg every day, hctz 50 mg every day, losartan 100 mg every day, metoprolol succ 200 mg every day - takes all medications in the mornings  Family Hx:   parents and siblings all had hypertension, 1 sister with CABG, PAD, now deceased;   Social Hx:      Tobacco: no  Alcohol:  no  Caffeine: occasional  Diet:   more home cooked meals, no fried foods; likes [place] and chicken, some fish; not much red meat; likes vegetables, no added salt  Exercise: limited by chronic back pain/rods - currently in PT; walks in neighborhood  Home BP readings:    only has readings from past week 6 readings averaging 132/76 (range 105-165/66-84)   Accessory Clinical Findings    Lab Results  Component Value Date   CREATININE 1.05 (H) 07/14/2022   BUN 16 07/14/2022   NA 138 07/14/2022   K 3.8 07/14/2022   CL 94 (L) 07/14/2022   CO2 27 07/14/2022   Lab Results  Component Value Date   ALT 16 07/14/2022   AST 17 07/14/2022   ALKPHOS 110 07/14/2022   BILITOT <0.2 07/14/2022   Lab Results  Component Value Date   HGBA1C 6.6 (H) 03/17/2021    Screening for Secondary Hypertension:      07/11/2022   11:16 AM  Causes  Drugs/Herbals Screened     - Comments limits salt, occasional caffeine, no EtOH, no tobacco  Sleep Apnea Screened    Relevant Labs/Studies:    Latest Ref Rng & Units 07/14/2022   12:02 PM 03/31/2022    2:54 PM 09/28/2021    3:04 PM  Basic Labs  Sodium 134 - 144 mmol/L 138  141  142   Potassium 3.5 - 5.2 mmol/L 3.8  4.0  4.2   Creatinine 0.57 - 1.00 mg/dL 6.30  1.60  1.09        Latest Ref Rng & Units 07/14/2022   12:02 PM 01/14/2019    2:44 PM  Thyroid   TSH 0.450 - 4.500 uIU/mL 4.250  1.480        Latest Ref Rng & Units 07/14/2022   12:02 PM  Renin/Aldosterone   Aldosterone 0.0 - 30.0 ng/dL 32.3   Aldos/Renin Ratio 0.0 - 30.0 5.6        Latest Ref Rng & Units 07/14/2022   12:02 PM  Metanephrines/Catecholamines   Epinephrine 0 - 62 pg/mL <15   Norepinephrine 0 - 874 pg/mL 1,158   Dopamine 0 - 48 pg/mL 40   Metanephrines 0.0 - 88.0 pg/mL <25.0   Normetanephrines  0.0 - 285.2 pg/mL 132.6           09/26/2022   11:16 AM  Renovascular   Renal Artery [place] Completed Yes      Home Medications    Current Outpatient Medications  Medication  Sig Dispense Refill   allopurinol (ZYLOPRIM) 300 MG tablet TAKE 1 AND 1/2 TABLETS(450 MG) BY MOUTH DAILY 45 tablet 2   amLODipine (NORVASC) 10 MG tablet TAKE 1 TABLET DAILY 90 tablet 1   Blood Glucose Monitoring Suppl (ONETOUCH VERIO FLEX SYSTEM) w/Device KIT Use as directed (Patient not taking: Reported on 08/17/2022)     buPROPion (WELLBUTRIN XL) 150 MG 24 hr tablet Take 3 tablets (450 mg total) by mouth daily. 90 tablet 1   DULoxetine (CYMBALTA) 60 MG capsule TAKE 2 CAPSULES(120 MG) BY MOUTH DAILY 180 capsule 1   famotidine (PEPCID) 40 MG tablet Take 40 mg by mouth daily.     ferrous sulfate 325 (65 FE) MG tablet Take 325 mg by mouth 3 (three) times daily with meals.     gabapentin (NEURONTIN) 300 MG capsule Take 2 capsules (600 mg total) by mouth 3 (three) times daily. 180 capsule 1   glucose blood (ONETOUCH VERIO) test strip Use as instructed (Patient not taking: Reported on 08/17/2022)     hydrochlorothiazide (HYDRODIURIL) 50 MG tablet Take 50 mg by mouth daily.     hyoscyamine (LEVSIN SL) 0.125 MG SL tablet SMARTSIG:1-2 Tablet(s) Sublingual Every 4-6 Hours PRN     Lancets (ONETOUCH ULTRASOFT) lancets Use as instructed (Patient not taking: Reported on 08/17/2022)     losartan (COZAAR) 100 MG tablet 1 tablet by mouth every morning     methocarbamol (ROBAXIN) 750 MG tablet Take 750 mg by mouth every 6 (six) hours as needed for muscle spasms.     metoprolol (TOPROL-XL) 200 MG 24 hr tablet Take 200 mg by mouth daily.     Multiple Vitamins-Minerals (MULTI COMPLETE/IRON) TABS Take 1 tablet by mouth daily at 6 (six) AM.     omeprazole (PRILOSEC) 40 MG capsule Take 40 mg by mouth daily.     QUEtiapine (SEROQUEL) 400 MG tablet TAKE 1 TABLET(400 MG) BY MOUTH AT BEDTIME 90 tablet 1   rosuvastatin (CRESTOR) 5 MG tablet Take 5 mg by mouth once a week.     zonisamide (ZONEGRAN) 100 MG capsule 1 capsule Orally at bedtime as needed     No current facility-administered medications for this visit.      Assessment & Plan   No BP recorded.  {Refresh Note OR Click here to enter  BP  :1}***   No problem-specific Assessment & Plan notes found for this encounter.   Phillips Hay PharmD CPP [facility]  [address] [place], [place] [address] [phone]

## 2022-11-02 NOTE — Telephone Encounter (Signed)
Patient states she has been having pain in her right shoulder. Patient states she was unable to dress herself. Patient states she has also been having pain in her right knee and right ankle. Patient states she has been using her muscle relaxer and gabapentin and recently added Voltaren Gel. Patient states she the has helped her pain. Patient states she will call back for an appointment if the pain does not go away or it gets worse.

## 2022-11-02 NOTE — Telephone Encounter (Signed)
Attempted to contact the patient and left message for patient to call the office.  

## 2022-11-03 ENCOUNTER — Encounter: Payer: Self-pay | Admitting: Internal Medicine

## 2022-11-03 ENCOUNTER — Ambulatory Visit: Payer: PPO | Admitting: Internal Medicine

## 2022-11-03 ENCOUNTER — Other Ambulatory Visit (INDEPENDENT_AMBULATORY_CARE_PROVIDER_SITE_OTHER): Payer: PPO

## 2022-11-03 VITALS — BP 120/76 | HR 83 | Ht 65.5 in | Wt 183.0 lb

## 2022-11-03 DIAGNOSIS — D500 Iron deficiency anemia secondary to blood loss (chronic): Secondary | ICD-10-CM

## 2022-11-03 DIAGNOSIS — K2990 Gastroduodenitis, unspecified, without bleeding: Secondary | ICD-10-CM | POA: Diagnosis not present

## 2022-11-03 DIAGNOSIS — K580 Irritable bowel syndrome with diarrhea: Secondary | ICD-10-CM | POA: Diagnosis not present

## 2022-11-03 DIAGNOSIS — K2970 Gastritis, unspecified, without bleeding: Secondary | ICD-10-CM

## 2022-11-03 DIAGNOSIS — K2100 Gastro-esophageal reflux disease with esophagitis, without bleeding: Secondary | ICD-10-CM

## 2022-11-03 LAB — CBC WITH DIFFERENTIAL/PLATELET
Basophils Absolute: 0.1 10*3/uL (ref 0.0–0.1)
Basophils Relative: 1.4 % (ref 0.0–3.0)
Eosinophils Absolute: 0.3 10*3/uL (ref 0.0–0.7)
Eosinophils Relative: 3.7 % (ref 0.0–5.0)
HCT: 36.5 % (ref 36.0–46.0)
Hemoglobin: 11.5 g/dL — ABNORMAL LOW (ref 12.0–15.0)
Lymphocytes Relative: 29.8 % (ref 12.0–46.0)
Lymphs Abs: 2.2 10*3/uL (ref 0.7–4.0)
MCHC: 31.5 g/dL (ref 30.0–36.0)
MCV: 97.4 fl (ref 78.0–100.0)
Monocytes Absolute: 0.4 10*3/uL (ref 0.1–1.0)
Monocytes Relative: 5.9 % (ref 3.0–12.0)
Neutro Abs: 4.4 10*3/uL (ref 1.4–7.7)
Neutrophils Relative %: 59.2 % (ref 43.0–77.0)
Platelets: 302 10*3/uL (ref 150.0–400.0)
RBC: 3.75 Mil/uL — ABNORMAL LOW (ref 3.87–5.11)
RDW: 14.2 % (ref 11.5–15.5)
WBC: 7.3 10*3/uL (ref 4.0–10.5)

## 2022-11-03 LAB — IBC PANEL
Iron: 200 ug/dL — ABNORMAL HIGH (ref 42–145)
Saturation Ratios: 44.8 % (ref 20.0–50.0)
TIBC: 446.6 ug/dL (ref 250.0–450.0)
Transferrin: 319 mg/dL (ref 212.0–360.0)

## 2022-11-03 LAB — FERRITIN: Ferritin: 21.9 ng/mL (ref 10.0–291.0)

## 2022-11-03 MED ORDER — OMEPRAZOLE 40 MG PO CPDR: 40.0000 mg | DELAYED_RELEASE_CAPSULE | Freq: Two times a day (BID) | ORAL | 3 refills | Status: DC

## 2022-11-03 NOTE — Patient Instructions (Signed)
You have been scheduled for an endoscopy. Please follow written instructions given to you at your visit today.  If you use inhalers (even only as needed), please bring them with you on the day of your procedure.  If you take any of the following medications, they will need to be adjusted prior to your procedure:   DO NOT TAKE 7 DAYS PRIOR TO TEST- Trulicity (dulaglutide) Ozempic, Wegovy (semaglutide) Mounjaro (tirzepatide) Bydureon Bcise (exanatide extended release)  DO NOT TAKE 1 DAY PRIOR TO YOUR TEST Rybelsus (semaglutide) Adlyxin (lixisenatide) Victoza (liraglutide) Byetta (exanatide) ___________________________________________________________________________   Your provider has requested that you go to the basement level for lab work before leaving today. Press "B" on the elevator. The lab is located at the first door on the left as you exit the elevator.  We have sent the following medications to your pharmacy for you to pick up at your convenience: Omeprazole - Take one pill twice daily 30-60 minutes prior to food  Purchase over the counter Imodium and take 1-2 pills prior to going out to prevent diarrhea.  Avoid night time snacks.  Decrease iron to once daily.  I appreciate the opportunity to care for you. Stan Head , MD, [facility]

## 2022-11-03 NOTE — Progress Notes (Addendum)
Tammy Mullins 72 y.o. [birth year] [id number]  Assessment & Plan:   Encounter Diagnoses  Name Primary?   Gastroesophageal reflux disease with esophagitis without hemorrhage Yes   Iron deficiency anemia due to chronic blood loss    Gastritis and gastroduodenitis    Irritable bowel syndrome with diarrhea    I want to have her go back on twice daily omeprazole, repeat EGD in about 6 weeks to make sure grade C esophagitis has healed.  She may use as needed Imodium for the IBS-D issues  Continue to avoid nighttime snacks  Reduce iron to daily from 3 times daily.  That may be causing some of the dyspeptic type symptoms.  I also think that the black fluid she regurgitated may have been related to iron supplementation (the color itself not the reflux).    Meds ordered this encounter  Medications   omeprazole (PRILOSEC) 40 MG capsule    Sig: Take 1 capsule (40 mg total) by mouth 2 (two) times daily before a meal. Before breakfast and supper    Dispense:  90 capsule    Refill:  3   Orders Placed This Encounter  Procedures   CBC with Differential/Platelet   Iron, TIBC and Ferritin Panel   Ambulatory referral to Gastroenterology    Subjective:   Chief Complaint: Follow-up of gastritis reflux esophagitis  HPI 72 year old black woman with a history of iron deficiency anemia, she had evaluation in March with EGD and colonoscopy as below:   - One 8 mm polyp in the transverse colon, removed                            with a hot snare. Resected and retrieved.                           - One 3 mm polyp in the sigmoid colon, removed with                            a cold snare. Resected and retrieved.                           - Diverticulosis in the sigmoid colon.                           - Appendectomy anastomosis, characterized by                            healthy appearing mucosa.                           - The examination was otherwise normal on direct                             and retroflexion views.    - LA Grade C reflux esophagitis. Biopsied.                           - Non-obstructing Schatzki ring. Biopsied.                           - 5 cm hiatal hernia.  Patulous GE junction - I                            think this is sig contributor to her GERD                           - Gastroesophageal flap valve classified as Hill                            Grade IV (no fold, wide open lumen, hiatal hernia                            present).                           - A single gastric polyp. Resected and retrieved.                            Clip was placed. Clip manufacturer: [employer].                           - Gastritis. Biopsied.  1. Surgical [P], gastric antrum - CHRONIC INACTIVE GASTRITIS WITH INTESTINAL METAPLASIA - NEGATIVE FOR H. PYLORI ON H&E AND HISTOCHEMICAL STAIN - NEGATIVE FOR DYSPLASIA OR MALIGNANCY 2. Surgical [P], gastric body - CHRONIC INACTIVE GASTRITIS (SEE NOTE) - NEGATIVE FOR H. PYLORI ON H&E AND IMMUNOHISTOCHEMICAL STAIN - NEGATIVE FOR INTESTINAL METAPLASIA, DYSPLASIA OR MALIGNANCY 3. Surgical [P], distal esophagus - SQUAMOUS MUCOSA WITH MILD REFLUX CHANGES - NEGATIVE FOR INCREASED INTRAEPITHELIAL EOSINOPHILS - NEGATIVE FOR DYSPLASIA OR MALIGNANCY 4. Surgical [P], gastric antrum polyp, polyp (1) - HYPERPLASTIC POLYP WITH ACUTE INFLAMMATION AND ULCERATION - POSITIVE FOR INTESTINAL METAPLASIA - NEGATIVE FOR DYSPLASIA OR MALIGNANCY 5. Surgical [P], colon, transverse, polyp (1) - INFLAMMATORY POLYP 6. Surgical [P], colon, sigmoid, polyp (1) - INFLAMMATORY POLYP   Intrinsic factor and antiparietal cell antibodies were negative.   She reports that she is doing better overall, I had prescribed omeprazole twice daily in March the prescription went through but she is now on it daily.  She is not having dysphagia.  She was having some nocturnal if she ate later in the evening so she stopped  snacking at night and that is better.  She actually regurgitated black fluid is a kink of her nose a couple of times.  She has IBS and when she eats out she may have to stop at the bathroom before she gets home, and if she is on a day she says that it is embarrassing.  This is not a new issue necessarily.  She is having some mild upper abdominal discomfort at times as well.  Overall she is improved but she still has these issues. Allergies  Allergen Reactions   Crestor [Rosuvastatin Calcium] Other (See Comments)    Causes severe body aches   Asa [Aspirin]     GI upset in hight doses can take a 81mg    Dicyclomine Other (See Comments)    Body and face rash, swelling.   Topiramate Diarrhea   Celebrex [Celecoxib]  Swelling and Rash    Swelling in legs   Current Meds  Medication Sig   allopurinol (ZYLOPRIM) 300 MG tablet TAKE 1 AND 1/2 TABLETS(450 MG) BY MOUTH DAILY   amLODipine (NORVASC) 10 MG tablet TAKE 1 TABLET DAILY   Blood Glucose Monitoring Suppl (ONETOUCH VERIO FLEX SYSTEM) w/Device KIT Use as directed   buPROPion (WELLBUTRIN XL) 150 MG 24 hr tablet Take 3 tablets (450 mg total) by mouth daily.   DULoxetine (CYMBALTA) 60 MG capsule TAKE 2 CAPSULES(120 MG) BY MOUTH DAILY   famotidine (PEPCID) 40 MG tablet Take 40 mg by mouth daily.   ferrous sulfate 325 (65 FE) MG tablet Take 325 mg by mouth 3 (three) times daily with meals.   gabapentin (NEURONTIN) 300 MG capsule Take 2 capsules (600 mg total) by mouth 3 (three) times daily.   glucose blood (ONETOUCH VERIO) test strip Use as instructed   hydrochlorothiazide (HYDRODIURIL) 50 MG tablet Take 50 mg by mouth daily.   hyoscyamine (LEVSIN SL) 0.125 MG SL tablet SMARTSIG:1-2 Tablet(s) Sublingual Every 4-6 Hours PRN   Lancets (ONETOUCH ULTRASOFT) lancets Use as instructed   losartan (COZAAR) 100 MG tablet 1 tablet by mouth every morning   methocarbamol (ROBAXIN) 750 MG tablet Take 750 mg by mouth every 6 (six) hours as needed for muscle spasms.    metoprolol (TOPROL-XL) 200 MG 24 hr tablet Take 200 mg by mouth daily.   Multiple Vitamins-Minerals (MULTI COMPLETE/IRON) TABS Take 1 tablet by mouth daily at 6 (six) AM.   omeprazole (PRILOSEC) 40 MG capsule Take 40 mg by mouth daily.   QUEtiapine (SEROQUEL) 400 MG tablet TAKE 1 TABLET(400 MG) BY MOUTH AT BEDTIME   rosuvastatin (CRESTOR) 5 MG tablet Take 5 mg by mouth once a week.   zonisamide (ZONEGRAN) 100 MG capsule 1 capsule Orally at bedtime as needed   Past Medical History:  Diagnosis Date   Allergic rhinitis, cause unspecified    Allergy    Anxiety state, unspecified    Aortic atherosclerosis (HCC) 07/11/2022   Arthritis    Attention deficit disorder without mention of hyperactivity    Barrett's esophagus    Blood transfusion without reported diagnosis 2018-10   Chronic pain syndrome    Colon ulcer - IC valve 08/10/2016   Depression    Diabetes type 2, controlled (HCC) 01/09/2014   Diverticulosis of colon (without mention of hemorrhage)    Esophageal reflux    Fibromyalgia    Gout, unspecified    Headache    Hiatal hernia    Irritable bowel syndrome    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    Myalgia and myositis, unspecified    Osteoarthrosis, unspecified whether generalized or localized, unspecified site    Personal history of unspecified circulatory disease    Pure hypercholesterolemia    Radial styloid tenosynovitis    Unspecified essential hypertension    Unspecified nonpsychotic mental disorder    Unspecified pruritic disorder    Past Surgical History:  Procedure Laterality Date   ANKLE SURGERY Right    APPENDECTOMY     BACK SURGERY  03/19/2021   CARPAL TUNNEL RELEASE Right    CERVICAL SPINE SURGERY     plates and pins   CHOLECYSTECTOMY N/A 06/26/2015   Procedure: LAPAROSCOPIC CHOLECYSTECTOMY;  Surgeon: Axel Filler, MD;  Location: [facility] OR;  Service: General;  Laterality: N/A;   COLONOSCOPY     COLONOSCOPY  2019    ESOPHAGOGASTRODUODENOSCOPY     ESOPHAGOGASTRODUODENOSCOPY N/A 12/04/2016   Procedure:  ESOPHAGOGASTRODUODENOSCOPY (EGD);  Surgeon: Meryl Dare, MD;  Location: [facility] ENDOSCOPY;  Service: Endoscopy;  Laterality: N/A;   LAPAROSCOPIC APPENDECTOMY N/A 06/26/2015   Procedure: LAPAROSCOPIC APPENDECTOMY;  Surgeon: Axel Filler, MD;  Location: [facility] OR;  Service: General;  Laterality: N/A;   ROTATOR CUFF REPAIR Right    SALPINGOOPHORECTOMY  Left   ectopic 1980   TOTAL SHOULDER ARTHROPLASTY     TRANSFORAMINAL LUMBAR INTERBODY FUSION (TLIF) WITH PEDICLE SCREW FIXATION 2 LEVEL N/A 01/28/2019   Procedure: TRANSFORAMINAL LUMBAR INTERBODY FUSION LUMBAR THREE-FOUR, LUMBAR FOUR-FIVE;  Surgeon: Tia Alert, MD;  Location: [facility] OR;  Service: Neurosurgery;  Laterality: N/A;  posterior   UPPER GASTROINTESTINAL ENDOSCOPY     Social History   Social History Narrative   No children   Husband died 11/07/2002   Niece is staying with her   She enjoys bowling, lunch with friends/sister, cards, church   Has a boyfriend who has his own house   Helps family members with driving   Retired, Worked in Psychologist, 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  [place]   No daily use of caffeine.   Right-handed.   Lives alone.         family history includes Cancer in her maternal grandmother; Cirrhosis in her father; Glaucoma in her brother and mother; Heart attack in her sister; Heart disease in her sister; Hypertension in her brother, brother, and sister; Lung disease in her mother; Prostate cancer in her brother.   Review of Systems As above  Objective:   Physical Exam BP 120/76   Pulse 83   Ht 5' 5.5" (1.664 m)   Wt 183 lb (83 kg)   BMI 29.99 kg/m

## 2022-11-04 LAB — IRON,TIBC AND FERRITIN PANEL
%SAT: 49 % (calc) — ABNORMAL HIGH (ref 16–45)
Ferritin: 60 ng/mL (ref 16–288)
Iron: 201 ug/dL — ABNORMAL HIGH (ref 45–160)
TIBC: 408 ug/dL (ref 250–450)

## 2022-11-10 DIAGNOSIS — M25511 Pain in right shoulder: Secondary | ICD-10-CM | POA: Diagnosis not present

## 2022-11-10 DIAGNOSIS — M25561 Pain in right knee: Secondary | ICD-10-CM | POA: Diagnosis not present

## 2022-11-22 DIAGNOSIS — Z6830 Body mass index (BMI) 30.0-30.9, adult: Secondary | ICD-10-CM | POA: Diagnosis not present

## 2022-11-22 DIAGNOSIS — M533 Sacrococcygeal disorders, not elsewhere classified: Secondary | ICD-10-CM | POA: Diagnosis not present

## 2022-11-24 ENCOUNTER — Ambulatory Visit: Payer: PPO | Admitting: Neurology

## 2022-11-25 ENCOUNTER — Ambulatory Visit: Payer: PPO | Admitting: Physician Assistant

## 2022-12-01 DIAGNOSIS — R0789 Other chest pain: Secondary | ICD-10-CM | POA: Diagnosis not present

## 2022-12-01 DIAGNOSIS — G44209 Tension-type headache, unspecified, not intractable: Secondary | ICD-10-CM | POA: Diagnosis not present

## 2022-12-01 DIAGNOSIS — Z8249 Family history of ischemic heart disease and other diseases of the circulatory system: Secondary | ICD-10-CM | POA: Diagnosis not present

## 2022-12-01 DIAGNOSIS — I10 Essential (primary) hypertension: Secondary | ICD-10-CM | POA: Diagnosis not present

## 2022-12-02 ENCOUNTER — Other Ambulatory Visit (HOSPITAL_COMMUNITY): Payer: Self-pay | Admitting: Family Medicine

## 2022-12-02 DIAGNOSIS — R0789 Other chest pain: Secondary | ICD-10-CM

## 2022-12-08 ENCOUNTER — Ambulatory Visit: Payer: PPO | Admitting: Physician Assistant

## 2022-12-09 DIAGNOSIS — M533 Sacrococcygeal disorders, not elsewhere classified: Secondary | ICD-10-CM | POA: Diagnosis not present

## 2022-12-09 DIAGNOSIS — Z6830 Body mass index (BMI) 30.0-30.9, adult: Secondary | ICD-10-CM | POA: Diagnosis not present

## 2022-12-12 ENCOUNTER — Ambulatory Visit (HOSPITAL_COMMUNITY)
Admission: RE | Admit: 2022-12-12 | Discharge: 2022-12-12 | Disposition: A | Discharge status: Home / Self Care | Payer: PPO | Source: Ambulatory Visit | Attending: Family Medicine | Admitting: Family Medicine

## 2022-12-12 DIAGNOSIS — R0789 Other chest pain: Secondary | ICD-10-CM | POA: Insufficient documentation

## 2022-12-14 ENCOUNTER — Other Ambulatory Visit: Payer: Self-pay | Admitting: Physician Assistant

## 2022-12-14 ENCOUNTER — Other Ambulatory Visit: Payer: Self-pay | Admitting: Neurological Surgery

## 2022-12-14 DIAGNOSIS — M533 Sacrococcygeal disorders, not elsewhere classified: Secondary | ICD-10-CM

## 2022-12-19 ENCOUNTER — Telehealth: Payer: Self-pay | Admitting: Internal Medicine

## 2022-12-19 NOTE — Telephone Encounter (Signed)
Inbound call from patient, would like to cancel procedure scheduled from 10/22 at 2:00 PM with Dr. Leone Payor. Patient states she is having chest discomfort and wanted to discuss with her cardiologist prior to rescheduling.

## 2022-12-20 ENCOUNTER — Encounter: Payer: PPO | Admitting: Internal Medicine

## 2022-12-21 ENCOUNTER — Other Ambulatory Visit: Payer: PPO

## 2022-12-22 ENCOUNTER — Ambulatory Visit: Payer: PPO | Admitting: Physician Assistant

## 2022-12-22 ENCOUNTER — Encounter: Payer: Self-pay | Admitting: Physician Assistant

## 2022-12-22 DIAGNOSIS — G894 Chronic pain syndrome: Secondary | ICD-10-CM | POA: Diagnosis not present

## 2022-12-22 DIAGNOSIS — F3181 Bipolar II disorder: Secondary | ICD-10-CM

## 2022-12-22 DIAGNOSIS — F5105 Insomnia due to other mental disorder: Secondary | ICD-10-CM

## 2022-12-22 DIAGNOSIS — F32A Depression, unspecified: Secondary | ICD-10-CM

## 2022-12-22 MED ORDER — BUPROPION HCL ER (XL) 150 MG PO TB24: 450.0000 mg | ORAL_TABLET | Freq: Every day | ORAL | 1 refills | Status: DC

## 2022-12-22 MED ORDER — DULOXETINE HCL 60 MG PO CPEP: 120.0000 mg | ORAL_CAPSULE | Freq: Every day | ORAL | 1 refills | Status: DC

## 2022-12-22 NOTE — Progress Notes (Signed)
[facility] Check  Patient ID: Tammy Mullins,  MRN: [id number]  PCP: Ceasar Lund, PA  Date of Evaluation: 12/22/2022 Time spent:20 minutes  Chief Complaint:  Chief Complaint   Follow-up    HISTORY/CURRENT STATUS:  HPI For routine med check.   Doing a lot better since increasing the Wellbutrin. Patient is able to enjoy things.  Energy and motivation are good.  No extreme sadness, tearfulness, or feelings of hopelessness.  Sleeps well most of the time. ADLs and personal hygiene are normal.   Denies any changes in concentration, making decisions, or remembering things.  Appetite has not changed.  Weight is stable.   Denies suicidal or homicidal thoughts.  Patient denies increased energy with decreased need for sleep, increased talkativeness, racing thoughts, impulsivity or risky behaviors, increased spending, increased libido, grandiosity, increased irritability or anger, paranoia, or hallucinations.  Denies dizziness, syncope, seizures, numbness, tingling, tremor, tics, unsteady gait, slurred speech, confusion. Denies muscle or joint pain, stiffness, or dystonia. Denies unexplained weight loss, frequent infections, or sores that heal slowly.  No polyphagia, polydipsia, or polyuria. Denies visual changes or paresthesias.   Individual Medical History/ Review of Systems: Changes? :No      Past medications for mental health diagnoses include: Cymbalta, Seroquel, Gabapentin, Wellbutrin  Allergies: Crestor [rosuvastatin calcium], Asa [aspirin], Dicyclomine, Topiramate, and Celebrex [celecoxib]  Current Medications:  Current Outpatient Medications:    allopurinol (ZYLOPRIM) 300 MG tablet, TAKE 1 AND 1/2 TABLETS(450 MG) BY MOUTH DAILY, Disp: 45 tablet, Rfl: 2   amLODipine (NORVASC) 10 MG tablet, TAKE 1 TABLET DAILY, Disp: 90 tablet, Rfl: 1   Blood Glucose Monitoring Suppl (ONETOUCH VERIO FLEX SYSTEM) w/Device KIT, Use as directed, Disp: , Rfl:    famotidine (PEPCID) 40 MG  tablet, Take 40 mg by mouth daily., Disp: , Rfl:    gabapentin (NEURONTIN) 300 MG capsule, Take 2 capsules (600 mg total) by mouth 3 (three) times daily., Disp: 180 capsule, Rfl: 1   glucose blood (ONETOUCH VERIO) test strip, Use as instructed, Disp: , Rfl:    hydrochlorothiazide (HYDRODIURIL) 50 MG tablet, Take 50 mg by mouth daily., Disp: , Rfl:    hyoscyamine (LEVSIN SL) 0.125 MG SL tablet, SMARTSIG:1-2 Tablet(s) Sublingual Every 4-6 Hours PRN, Disp: , Rfl:    Lancets (ONETOUCH ULTRASOFT) lancets, Use as instructed, Disp: , Rfl:    losartan (COZAAR) 100 MG tablet, 1 tablet by mouth every morning, Disp: , Rfl:    methocarbamol (ROBAXIN) 750 MG tablet, Take 750 mg by mouth every 6 (six) hours as needed for muscle spasms., Disp: , Rfl:    metoprolol (TOPROL-XL) 200 MG 24 hr tablet, Take 200 mg by mouth daily., Disp: , Rfl:    Multiple Vitamins-Minerals (MULTI COMPLETE/IRON) TABS, Take 1 tablet by mouth daily at 6 (six) AM., Disp: , Rfl:    omeprazole (PRILOSEC) 40 MG capsule, Take 1 capsule (40 mg total) by mouth 2 (two) times daily before a meal. Before breakfast and supper, Disp: 90 capsule, Rfl: 3   QUEtiapine (SEROQUEL) 400 MG tablet, TAKE 1 TABLET(400 MG) BY MOUTH AT BEDTIME, Disp: 90 tablet, Rfl: 1   rosuvastatin (CRESTOR) 5 MG tablet, Take 5 mg by mouth once a week., Disp: , Rfl:    zonisamide (ZONEGRAN) 100 MG capsule, 1 capsule Orally at bedtime as needed, Disp: , Rfl:    buPROPion (WELLBUTRIN XL) 150 MG 24 hr tablet, Take 3 tablets (450 mg total) by mouth daily., Disp: 270 tablet, Rfl: 1   DULoxetine (CYMBALTA) 60 MG  capsule, Take 2 capsules (120 mg total) by mouth daily., Disp: 180 capsule, Rfl: 1   ferrous sulfate 325 (65 FE) MG tablet, Take 325 mg by mouth daily with breakfast. (Patient not taking: Reported on 12/22/2022), Disp: , Rfl:  Medication Side Effects: none  Family Medical/ Social History: Changes?  No  MENTAL HEALTH EXAM:  There were no vitals taken for this visit.There  is no height or weight on file to calculate BMI.  General Appearance: Casual and Well Groomed  Eye Contact:  Good  Speech:  Clear and Coherent, Normal Rate, and Talkative  Volume:  Normal  Mood:  Euthymic  Affect:  Congruent  Thought Process:  Goal Directed and Descriptions of Associations: Intact  Orientation:  Full (Time, Place, and Person)  Thought Content: Logical   Suicidal Thoughts:  No  Homicidal Thoughts:  No  Memory:  WNL  Judgement:  Good  Insight:  Good  Psychomotor Activity:  Normal  Concentration:  Concentration: Good and Attention Span: Good  Recall:  Good  Fund of Knowledge: Good  Language: Good  Assets:  Desire for Improvement Financial Resources/Insurance Housing Transportation  ADL's:  Intact  Cognition: WNL  Prognosis:  Good   DIAGNOSES:    ICD-10-CM   1. Bipolar 2 disorder (HCC)  F31.81     2. Insomnia due to other mental disorder  F51.05    F99     3. Chronic pain syndrome  G89.4     4. Melancholy  F32.A      Receiving Psychotherapy: No   RECOMMENDATIONS:  PDMP was reviewed. Gabapentin filled 11/25/2022. I provided 20 minutes of  face-to-face time during this encounter, including time spent before and after the visit in records review, medical decision making, counseling pertinent to today's visit, and charting.   I'm glad to see her doing better. No changes needed.   Continue Wellbutrin XL 450 mg every morning. Continue Cymbalta 60 mg, 2 p.o. daily. Continue Seroquel 400 mg, 1 p.o. nightly. Continue gabapentin 300 mg, 2 p.o. 3 times daily. (For pain) Return in 4 months.  Melony Overly, PA-C

## 2022-12-23 NOTE — Discharge Instructions (Signed)

## 2022-12-26 ENCOUNTER — Ambulatory Visit
Admission: RE | Admit: 2022-12-26 | Discharge: 2022-12-26 | Disposition: A | Discharge status: Home / Self Care | Payer: PPO | Source: Ambulatory Visit | Attending: Neurological Surgery

## 2022-12-26 ENCOUNTER — Inpatient Hospital Stay
Admission: RE | Admit: 2022-12-26 | Discharge: 2022-12-26 | Disposition: A | Discharge status: Home / Self Care | Payer: PPO | Source: Ambulatory Visit | Attending: Neurological Surgery

## 2022-12-26 DIAGNOSIS — M533 Sacrococcygeal disorders, not elsewhere classified: Secondary | ICD-10-CM

## 2022-12-29 ENCOUNTER — Ambulatory Visit: Payer: PPO | Admitting: Neurology

## 2023-01-13 DIAGNOSIS — M533 Sacrococcygeal disorders, not elsewhere classified: Secondary | ICD-10-CM | POA: Diagnosis not present

## 2023-01-18 ENCOUNTER — Other Ambulatory Visit: Payer: Self-pay | Admitting: Surgery

## 2023-01-18 DIAGNOSIS — M533 Sacrococcygeal disorders, not elsewhere classified: Secondary | ICD-10-CM

## 2023-02-09 ENCOUNTER — Other Ambulatory Visit: Payer: Self-pay | Admitting: Physician Assistant

## 2023-02-20 ENCOUNTER — Inpatient Hospital Stay: Admission: RE | Admit: 2023-02-20 | Payer: PPO | Source: Ambulatory Visit

## 2023-02-20 ENCOUNTER — Other Ambulatory Visit: Payer: PPO

## 2023-03-08 ENCOUNTER — Telehealth: Payer: Self-pay | Admitting: Neurology

## 2023-03-08 NOTE — Progress Notes (Signed)
 Chief Complaint: acid reflux Primary GI Doctor: Dr. Avram  HPI: 73 year old black woman with a history of iron deficiency anemia, she had evaluation in March,2024 with EGD and colonoscopy as below:   Colon: - One 8 mm polyp in the transverse colon, removed with a hot snare. Resected and retrieved. - One 3 mm polyp in the sigmoid colon, removed with a cold snare. Resected and retrieved. - Diverticulosis in the sigmoid colon. - Appendectomy anastomosis, characterized by healthy appearing mucosa. - The examination was otherwise normal on direct and retroflexion views.  EGD: -LA Grade C reflux esophagitis. Biopsied.  -Non-obstructing Schatzki ring. Biopsied  -5 cm hiatal hernia. Patulous GE junction  -Gastroesophageal flap valve classified as Hill Grade IV (no fold, wide open lumen, hiatal hernia present). -A single gastric polyp. Resected and retrieved. Clip was placed. Clip manufacturer: [employer]. -Gastritis.  Biopsied.  1. Surgical [P], gastric antrum - CHRONIC INACTIVE GASTRITIS WITH INTESTINAL METAPLASIA - NEGATIVE FOR H. PYLORI ON H&E AND HISTOCHEMICAL STAIN - NEGATIVE FOR DYSPLASIA OR MALIGNANCY 2. Surgical [P], gastric body - CHRONIC INACTIVE GASTRITIS (SEE NOTE) - NEGATIVE FOR H. PYLORI ON H&E AND IMMUNOHISTOCHEMICAL STAIN - NEGATIVE FOR INTESTINAL METAPLASIA, DYSPLASIA OR MALIGNANCY 3. Surgical [P], distal esophagus - SQUAMOUS MUCOSA WITH MILD REFLUX CHANGES - NEGATIVE FOR INCREASED INTRAEPITHELIAL EOSINOPHILS - NEGATIVE FOR DYSPLASIA OR MALIGNANCY 4. Surgical [P], gastric antrum polyp, polyp (1) - HYPERPLASTIC POLYP WITH ACUTE INFLAMMATION AND ULCERATION - POSITIVE FOR INTESTINAL METAPLASIA - NEGATIVE FOR DYSPLASIA OR MALIGNANCY 5. Surgical [P], colon, transverse, polyp (1) - INFLAMMATORY POLYP 6. Surgical [P], colon, sigmoid, polyp (1) - INFLAMMATORY POLYP   Intrinsic factor and antiparietal cell antibodies were negative.  Follow-up visit with  Dr. Avram on 11/03/2022 - Patient reported she was doing better overall.  Dr. Avram had prescribed omeprazole  twice daily in March but at the last appointment she was down to once daily.  Due to some mild reflux symptoms Dr. Avram placed her back on omeprazole  twice daily and wanted repeat EGD in about 6 weeks to make sure grade C esophagitis had healed.  She Braxden Lovering use needed Imodium for IBS-D issues.  Discussed avoiding nighttime snacks which seems to exacerbate her issues.  He reduced her iron to daily from 3 times daily.  He thought that this Daquisha Clermont be causing some of her dyspeptic type symptoms.  --Patient was scheduled for EGD on 12/20/2022 however due to some chest discomfort she canceled procedure and wanted to discuss with her cardiologist prior to rescheduling.  --She states she saw Family Dr. Teresa and she ordered CT cardiac scoring test. Per patient they discussed results with no changes to her current medications. Follow-up 6 months.   Interval History     Patient has history of GERD with grade C esophagitis. Patient has not been taking her Omeprazole  40 mg twice daily as instructed by Dr. Avram at last appointment in September. She states she took it for a few months, but stopped it. No known cause as to why she stopped it. She is currently taking famotidine 40 mg twice daily. She reports this is not controlling her symptoms. She is having almost daily pyrosis, throat burning, and nausea. Patient denies dysphagia.      Patient also has history of IBS-D and Dr. Avram had instructed her to use OTC Imodium as needed. She states she takes one tablet in the morning. She reports 1 loose stool after meals, typically 3-4 stools a day. The morning Imodium helps after  breakfast but then she has diarrhea after lunch and dinner. She had forgotten she could titrate the dosage.    Lastly, patient has history of iron deficiency, she was instructed to decrease her iron from TID to daily per Dr. Avram to  see if it helped some of her symptoms. She tells me today she stopped it completely, few months ago. Again, no known cause of why she stopped it. She did note dark stool with the iron, but since she stopped it her stools are brown. Denies fatigue, chest pain, or shortness of breath.  Wt Readings from Last 3 Encounters:  03/09/23 176 lb (79.8 kg)  11/03/22 183 lb (83 kg)  10/03/22 182 lb 6.4 oz (82.7 kg)    Past Medical History:  Diagnosis Date   Allergic rhinitis, cause unspecified    Allergy    Anxiety state, unspecified    Aortic atherosclerosis (HCC) 07/11/2022   Arthritis    Attention deficit disorder without mention of hyperactivity    Barrett's esophagus    Blood transfusion without reported diagnosis 2018-10   Chronic pain syndrome    Colon ulcer - IC valve 08/10/2016   Depression    Diabetes type 2, controlled (HCC) 01/09/2014   Diverticulosis of colon (without mention of hemorrhage)    Esophageal reflux    Fibromyalgia    Gout, unspecified    Headache    Hiatal hernia    Irritable bowel syndrome    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    Myalgia and myositis, unspecified    Osteoarthrosis, unspecified whether generalized or localized, unspecified site    Personal history of unspecified circulatory disease    Pure hypercholesterolemia    Radial styloid tenosynovitis    Unspecified essential hypertension    Unspecified nonpsychotic mental disorder    Unspecified pruritic disorder     Past Surgical History:  Procedure Laterality Date   ANKLE SURGERY Right    APPENDECTOMY     BACK SURGERY  03/19/2021   CARPAL TUNNEL RELEASE Right    CERVICAL SPINE SURGERY     plates and pins   CHOLECYSTECTOMY N/A 06/26/2015   Procedure: LAPAROSCOPIC CHOLECYSTECTOMY;  Surgeon: Lynda Leos, MD;  Location: [facility] OR;  Service: General;  Laterality: N/A;   COLONOSCOPY     COLONOSCOPY  2019   ESOPHAGOGASTRODUODENOSCOPY     ESOPHAGOGASTRODUODENOSCOPY N/A 12/04/2016    Procedure: ESOPHAGOGASTRODUODENOSCOPY (EGD);  Surgeon: Aneita Gwendlyn DASEN, MD;  Location: [facility] ENDOSCOPY;  Service: Endoscopy;  Laterality: N/A;   LAPAROSCOPIC APPENDECTOMY N/A 06/26/2015   Procedure: LAPAROSCOPIC APPENDECTOMY;  Surgeon: Lynda Leos, MD;  Location: [facility] OR;  Service: General;  Laterality: N/A;   ROTATOR CUFF REPAIR Right    SALPINGOOPHORECTOMY  Left   ectopic 1980   TOTAL SHOULDER ARTHROPLASTY     TRANSFORAMINAL LUMBAR INTERBODY FUSION (TLIF) WITH PEDICLE SCREW FIXATION 2 LEVEL N/A 01/28/2019   Procedure: TRANSFORAMINAL LUMBAR INTERBODY FUSION LUMBAR THREE-FOUR, LUMBAR FOUR-FIVE;  Surgeon: Joshua Alm RAMAN, MD;  Location: [facility] OR;  Service: Neurosurgery;  Laterality: N/A;  posterior   UPPER GASTROINTESTINAL ENDOSCOPY      Current Outpatient Medications  Medication Sig Dispense Refill   allopurinol  (ZYLOPRIM ) 300 MG tablet TAKE 1 AND 1/2 TABLETS(450 MG) BY MOUTH DAILY 45 tablet 2   amLODipine  (NORVASC ) 10 MG tablet TAKE 1 TABLET DAILY 90 tablet 1   Blood Glucose Monitoring Suppl (ONETOUCH VERIO FLEX SYSTEM) w/Device KIT Use as directed     buPROPion  (WELLBUTRIN  XL) 150 MG 24 hr tablet Take  3 tablets (450 mg total) by mouth daily. 270 tablet 1   DULoxetine  (CYMBALTA ) 60 MG capsule Take 2 capsules (120 mg total) by mouth daily. 180 capsule 1   famotidine (PEPCID) 40 MG tablet Take 40 mg by mouth daily.     ferrous sulfate  325 (65 FE) MG tablet Take 325 mg by mouth daily with breakfast.     gabapentin  (NEURONTIN ) 300 MG capsule Take 2 capsules (600 mg total) by mouth 3 (three) times daily. 180 capsule 1   glucose blood (ONETOUCH VERIO) test strip Use as instructed     hydrochlorothiazide  (HYDRODIURIL ) 50 MG tablet Take 50 mg by mouth daily.     hyoscyamine  (LEVSIN  SL) 0.125 MG SL tablet SMARTSIG:1-2 Tablet(s) Sublingual Every 4-6 Hours PRN     Lancets (ONETOUCH ULTRASOFT) lancets Use as instructed     losartan  (COZAAR ) 100 MG tablet 1 tablet by mouth every morning     methocarbamol   (ROBAXIN ) 750 MG tablet Take 750 mg by mouth every 6 (six) hours as needed for muscle spasms.     metoprolol  (TOPROL -XL) 200 MG 24 hr tablet Take 200 mg by mouth daily.     QUEtiapine  (SEROQUEL ) 400 MG tablet TAKE 1 TABLET(400 MG) BY MOUTH AT BEDTIME 90 tablet 1   rosuvastatin  (CRESTOR ) 5 MG tablet Take 5 mg by mouth once a week.     zonisamide  (ZONEGRAN ) 100 MG capsule 1 capsule Orally at bedtime as needed     No current facility-administered medications for this visit.    Allergies as of 03/09/2023 - Review Complete 03/09/2023  Allergen Reaction Noted   Crestor  [rosuvastatin  calcium ] Other (See Comments) 11/04/2010   Dorethia Sharper ]  06/30/2016   Dicyclomine  Other (See Comments) 09/09/2021   Topiramate  Diarrhea 04/15/2019   Celebrex [celecoxib] Swelling and Rash 01/15/2019    Family History  Problem Relation Age of Onset   Lung disease Mother        ?    Glaucoma Mother    Cirrhosis Father    Hypertension Sister    Heart disease Sister    Heart attack Sister    Hypertension Brother    Glaucoma Brother    Prostate cancer Brother    Hypertension Brother    Cancer Maternal Grandmother        ?   Kidney disease Neg Hx    Diabetes Neg Hx    Colon cancer Neg Hx    Colon polyps Neg Hx    Rectal cancer Neg Hx    Stomach cancer Neg Hx    Review of Systems:    Constitutional: No weight loss, fever, chills, weakness or fatigue HEENT: Eyes: No change in vision               Ears, Nose, Throat:  No change in hearing or congestion Skin: No rash or itching Cardiovascular: No chest pain, chest pressure or palpitations   Respiratory: No SOB or cough Gastrointestinal: See HPI and otherwise negative Genitourinary: No dysuria or change in urinary frequency Neurological: No headache, dizziness or syncope Musculoskeletal: No new muscle or joint pain Hematologic: No bleeding or bruising Psychiatric: No history of depression or anxiety   Physical Exam:  Vital signs: BP 112/66   Pulse  83   Ht 5' 5.5 (1.664 m)   Wt 176 lb (79.8 kg)   BMI 28.84 kg/m   Constitutional:   Pleasant African American female appears to be in NAD, Well developed, Well nourished, alert and cooperative Neck:  Supple Throat:  Oral cavity and pharynx without inflammation, swelling or lesion.  Respiratory: Respirations even and unlabored. Lungs clear to auscultation bilaterally.   No wheezes, crackles, or rhonchi.  Cardiovascular: Normal S1, S2. Regular rate and rhythm. No peripheral edema, cyanosis or pallor.  Gastrointestinal:  Soft, nondistended, generalized abd tenderness with palpation. No rebound or guarding. Normal bowel sounds. No appreciable masses or hepatomegaly. Rectal:  Not performed.  Neurologic:  Alert and  oriented x4;  grossly normal neurologically.  Skin:   Dry and intact without significant lesions or rashes. Psychiatric: Oriented to person, place and time. Demonstrates good judgement and reason without abnormal affect or behaviors.  RELEVANT LABS AND IMAGING: CBC    Latest Ref Rng & Units 11/03/2022    2:56 PM 05/11/2022    4:14 PM 03/31/2022    2:54 PM  CBC  WBC 4.0 - 10.5 K/uL 7.3  8.9  6.9   Hemoglobin 12.0 - 15.0 g/dL 88.4  89.2  9.7   Hematocrit 36.0 - 46.0 % 36.5  33.6  30.9   Platelets 150.0 - 400.0 K/uL 302.0  336.0  319      CMP     Latest Ref Rng & Units 07/14/2022   12:02 PM 03/31/2022    2:54 PM 09/28/2021    3:04 PM  CMP  Glucose 70 - 99 mg/dL 874  892  886   BUN 8 - 27 mg/dL 16  14  14    Creatinine 0.57 - 1.00 mg/dL 8.94  9.17  9.01   Sodium 134 - 144 mmol/L 138  141  142   Potassium 3.5 - 5.2 mmol/L 3.8  4.0  4.2   Chloride 96 - 106 mmol/L 94  105  106   CO2 20 - 29 mmol/L 27  29  30    Calcium  8.7 - 10.3 mg/dL 89.6  9.7  9.6   Total Protein 6.0 - 8.5 g/dL 7.4  6.5  6.4   Total Bilirubin 0.0 - 1.2 mg/dL <9.7  0.2  0.3   Alkaline Phos 44 - 121 IU/L 110     AST 0 - 40 IU/L 17  10  11    ALT 0 - 32 IU/L 16  7  9       Lab Results  Component Value Date    TSH 4.250 07/14/2022    Iron/TIBC/Ferritin/ %Sat    Component Value Date/Time   IRON 200 (H) 11/03/2022 1527   TIBC 446.6 11/03/2022 1527   FERRITIN 21.9 11/03/2022 1527   IRONPCTSAT 44.8 11/03/2022 1527   IRONPCTSAT 49 (H) 11/03/2022 1456    12/12/22 CT cardiac scoring test IMPRESSION: 1. Faint areas of ground-glass reticulation in the right upper, middle and right lower lobes, nonspecific but Kailee Essman be infectious or inflammatory bronchiolitis. 2. Small hiatal hernia.  Assessment: Encounter Diagnoses  Name Primary?   Gastroesophageal reflux disease with esophagitis without hemorrhage Yes   Irritable bowel syndrome with diarrhea    Iron deficiency anemia due to chronic blood loss      73 year old African American female patient with history of GERD with esophagitis who is overdue for surveillance screening EGD to make sure it has healed. She also stopped her PPI therapy on own. The famotidine BID is not managing her symptoms. I will restart the Omeprazole  twice daily.    Her IBS-D has improved some with adding OTC imodium, but she had forgotten how to titrate the medication during day to manage symptoms. Reinforced that today.     Lastly, she  had evaluation in March, 2024 with EGD and colonoscopy for iron deficiency. Intrinsic factor and antiparietal cell antibodies were negative. She completely stopped her iron supplementation for unknown reasons. I will go ahead and recheck her levels.  Plan: -Recheck iron levels today -Restart Omeprazole  40mg  po BID -schedule EGD in [facility] with Dr. Avram to follow-up on esophagitis -Imodium 1-2 tablets BID-TID before meals, titrate as needed.   Thank you for the courtesy of this consult. Please call me with any questions or concerns.   Pura Picinich, FNP-C [facility] 03/09/2023, 2:16 PM  Cc: Wonda Worth SQUIBB, PA

## 2023-03-08 NOTE — Telephone Encounter (Signed)
 Pt r/s missed 6 month f/u

## 2023-03-09 ENCOUNTER — Other Ambulatory Visit (INDEPENDENT_AMBULATORY_CARE_PROVIDER_SITE_OTHER): Payer: PPO

## 2023-03-09 ENCOUNTER — Other Ambulatory Visit: Payer: Self-pay | Admitting: Physician Assistant

## 2023-03-09 ENCOUNTER — Encounter: Payer: Self-pay | Admitting: Gastroenterology

## 2023-03-09 ENCOUNTER — Ambulatory Visit: Payer: PPO | Admitting: Gastroenterology

## 2023-03-09 VITALS — BP 112/66 | HR 83 | Ht 65.5 in | Wt 176.0 lb

## 2023-03-09 DIAGNOSIS — K2100 Gastro-esophageal reflux disease with esophagitis, without bleeding: Secondary | ICD-10-CM | POA: Diagnosis not present

## 2023-03-09 DIAGNOSIS — K580 Irritable bowel syndrome with diarrhea: Secondary | ICD-10-CM

## 2023-03-09 DIAGNOSIS — D500 Iron deficiency anemia secondary to blood loss (chronic): Secondary | ICD-10-CM | POA: Diagnosis not present

## 2023-03-09 DIAGNOSIS — E2839 Other primary ovarian failure: Secondary | ICD-10-CM

## 2023-03-09 LAB — IBC + FERRITIN
Ferritin: 8.8 ng/mL — ABNORMAL LOW (ref 10.0–291.0)
Iron: 49 ug/dL (ref 42–145)
Saturation Ratios: 10.1 % — ABNORMAL LOW (ref 20.0–50.0)
TIBC: 484.4 ug/dL — ABNORMAL HIGH (ref 250.0–450.0)
Transferrin: 346 mg/dL (ref 212.0–360.0)

## 2023-03-09 MED ORDER — OMEPRAZOLE 40 MG PO CPDR: 40.0000 mg | DELAYED_RELEASE_CAPSULE | Freq: Two times a day (BID) | ORAL | 3 refills | Status: AC

## 2023-03-09 NOTE — Patient Instructions (Addendum)
 Your provider has requested that you go to the basement level for lab work before leaving today. Press B on the elevator. The lab is located at the first door on the left as you exit the elevator.  We have sent the following medications to your pharmacy for you to pick up at your convenience: omeprazole  40 mg twice daily.  Please purchase the following medications over the counter and take as directed: Imodium 1-2 tablets by mouth up to 3 x daily.   You have been scheduled for an endoscopy. Please follow written instructions given to you at your visit today.  If you use inhalers (even only as needed), please bring them with you on the day of your procedure.  If you take any of the following medications, they will need to be adjusted prior to your procedure:   DO NOT TAKE 7 DAYS PRIOR TO TEST- Trulicity (dulaglutide) Ozempic, Wegovy (semaglutide) Mounjaro (tirzepatide) Bydureon Bcise (exanatide extended release)  DO NOT TAKE 1 DAY PRIOR TO YOUR TEST Rybelsus (semaglutide) Adlyxin (lixisenatide) Victoza (liraglutide) Byetta (exanatide) ___________________________________________________________________________   If your blood pressure at your visit was 140/90 or greater, please contact your primary care physician to follow up on this.  _______________________________________________________  If you are age 75 or older, your body mass index should be between 23-30. Your Body mass index is 28.84 kg/m. If this is out of the aforementioned range listed, please consider follow up with your Primary Care Provider.  If you are age 74 or younger, your body mass index should be between 19-25. Your Body mass index is 28.84 kg/m. If this is out of the aformentioned range listed, please consider follow up with your Primary Care Provider.   ________________________________________________________  The [place] [facility] providers would like to encourage you to use MYCHART to communicate with  providers for non-urgent requests or questions.  Due to long hold times on the telephone, sending your provider a message by [facility] may be a faster and more efficient way to get a response.  Please allow 48 business hours for a response.  Please remember that this is for non-urgent requests.  _______________________________________________________

## 2023-03-14 ENCOUNTER — Encounter: Payer: Self-pay | Admitting: Internal Medicine

## 2023-03-15 ENCOUNTER — Inpatient Hospital Stay: Admission: RE | Admit: 2023-03-15 | Payer: PPO | Source: Ambulatory Visit

## 2023-03-16 ENCOUNTER — Other Ambulatory Visit: Payer: Self-pay | Admitting: Physician Assistant

## 2023-03-21 ENCOUNTER — Ambulatory Visit: Payer: PPO | Admitting: Internal Medicine

## 2023-03-21 ENCOUNTER — Encounter: Payer: Self-pay | Admitting: Internal Medicine

## 2023-03-21 VITALS — BP 125/73 | HR 68 | Temp 98.0°F | Resp 11 | Ht 65.5 in | Wt 176.0 lb

## 2023-03-21 DIAGNOSIS — K219 Gastro-esophageal reflux disease without esophagitis: Secondary | ICD-10-CM

## 2023-03-21 DIAGNOSIS — K2100 Gastro-esophageal reflux disease with esophagitis, without bleeding: Secondary | ICD-10-CM | POA: Diagnosis not present

## 2023-03-21 DIAGNOSIS — T182XXA Foreign body in stomach, initial encounter: Secondary | ICD-10-CM

## 2023-03-21 DIAGNOSIS — K449 Diaphragmatic hernia without obstruction or gangrene: Secondary | ICD-10-CM

## 2023-03-21 MED ORDER — SODIUM CHLORIDE 0.9 % IV SOLN: 500.0000 mL | Freq: Once | INTRAVENOUS | Status: DC

## 2023-03-21 NOTE — Progress Notes (Signed)
Pt's states no medical or surgical changes since previsit or office visit. 

## 2023-03-21 NOTE — Progress Notes (Signed)
Sedate, gd SR, tolerated procedure well, VSS, report to RN 

## 2023-03-21 NOTE — Op Note (Signed)
[facility] Patient Name: Fayelynn Shrake Procedure Date: 03/21/2023 8:29 AM MRN: [id number] Endoscopist: Iva Boop , MD, [id number] Age: 73 Referring MD:  Date of Birth: Dec 17, 1950 Gender: Female Account #: [id number] Procedure:                Upper GI endoscopy Indications:              Gastro-esophageal reflux disease, Follow-up of                            gastro-esophageal reflux disease Grade C 3/24 (iron                            def anemia evaluation w/ colonoscopy also), she had                            stopped PPi and iron - back on both and sxs much                            better Medicines:                Monitored Anesthesia Care Procedure:                Pre-Anesthesia Assessment:                           - Prior to the procedure, a History and Physical                            was performed, and patient medications and                            allergies were reviewed. The patient's tolerance of                            previous anesthesia was also reviewed. The risks                            and benefits of the procedure and the sedation                            options and risks were discussed with the patient.                            All questions were answered, and informed consent                            was obtained. Prior Anticoagulants: The patient has                            taken no anticoagulant or antiplatelet agents. ASA                            Grade Assessment: II - A patient with mild systemic  disease. After reviewing the risks and benefits,                            the patient was deemed in satisfactory condition to                            undergo the procedure.                           After obtaining informed consent, the endoscope was                            passed under direct vision. Throughout the                            procedure, the patient's blood pressure, pulse,  and                            oxygen saturations were monitored continuously. The                            GIF [id number] #[id number] was introduced through the                            mouth, and advanced to the second part of duodenum.                            The upper GI endoscopy was accomplished without                            difficulty. The patient tolerated the procedure                            well. Scope In: Scope Out: Findings:                 LA Grade A (one or more mucosal breaks less than 5                            mm, not extending between tops of 2 mucosal folds)                            esophagitis was found in the lower third of the                            esophagus.                           A 5 cm hiatal hernia was present.                           A large amount of food (residue) was found in the                            gastric  body and in the gastric antrum.                           Food (residue) was found in the entire duodenum.                           The cardia and gastric fundus were otherwise normal                            on retroflexion. Complications:            No immediate complications. Estimated Blood Loss:     Estimated blood loss: none. Impression:               - LA Grade A reflux esophagitis.                           - 5 cm hiatal hernia.                           - A large amount of food (residue) in the stomach.                            This limits exam of stomach significantly.                           - Retained food in the duodenum. Small amount.                           - No specimens collected. Recommendation:           - Patient has a contact number available for                            emergencies. The signs and symptoms of potential                            delayed complications were discussed with the                            patient. Return to normal activities tomorrow.                             Written discharge instructions were provided to the                            patient.                           - OFFICE WILLSCHEDULE 4 HOUR SOLID PHASE GASTRIC                            EMPTYING STUDY TO EVALUATE GERD, SUSPEXCTED DELAYED                            GASTRIC EMPTYING (FOOD RETAINED IN STOMACH)                           -  Stay on bid 40 mg omeprazole and ferrous sulfate                            (iron levels had become low again after stopping                            supplementation                           Anticipate she will need a repeat EGD - may need to                            have a longer clear liquid period pre-procedure.                            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

## 2023-03-21 NOTE — Patient Instructions (Addendum)
The esophagus looked better than last year.  There was food still in the stomach - no blockage seen so this is puzzling.  I suppose it could be a medication side effect.  We are going to schedule a test to check how the stomach empties - it is called a gastric emptying study.  Stay on your current medications.  I appreciate the opportunity to care for you. Iva Boop, MD, FACG   YOU HAD AN ENDOSCOPIC PROCEDURE TODAY AT THE [facility]:   Refer to the procedure report that was given to you for any specific questions about what was found during the examination.  If the procedure report does not answer your questions, please call your gastroenterologist to clarify.  If you requested that your care partner not be given the details of your procedure findings, then the procedure report has been included in a sealed envelope for you to review at your convenience later.  YOU SHOULD EXPECT: Some feelings of bloating in the abdomen. Passage of more gas than usual.  Walking can help get rid of the air that was put into your GI tract during the procedure and reduce the bloating. If you had a lower endoscopy (such as a colonoscopy or flexible sigmoidoscopy) you may notice spotting of blood in your stool or on the toilet paper. If you underwent a bowel prep for your procedure, you may not have a normal bowel movement for a few days.  Please Note:  You might notice some irritation and congestion in your nose or some drainage.  This is from the oxygen used during your procedure.  There is no need for concern and it should clear up in a day or so.  SYMPTOMS TO REPORT IMMEDIATELY:  Following upper endoscopy (EGD)  Vomiting of blood or coffee ground material  New chest pain or pain under the shoulder blades  Painful or persistently difficult swallowing  New shortness of breath  Fever of 100F or higher  Black, tarry-looking stools  For urgent or emergent issues, a gastroenterologist can  be reached at any hour by calling [phone]. Do not use MyChart messaging for urgent concerns.    DIET:  We do recommend a small meal at first, but then you may proceed to your regular diet.  Drink plenty of fluids but you should avoid alcoholic beverages for 24 hours.  ACTIVITY:  You should plan to take it easy for the rest of today and you should NOT DRIVE or use heavy machinery until tomorrow (because of the sedation medicines used during the test).    FOLLOW UP: Our staff will call the number listed on your records the next business day following your procedure.  We will call around 7:15- 8:00 am to check on you and address any questions or concerns that you may have regarding the information given to you following your procedure. If we do not reach you, we will leave a message.     If any biopsies were taken you will be contacted by phone or by letter within the next 1-3 weeks.  Please call us at [phone] if you have not heard about the biopsies in 3 weeks.    SIGNATURES/CONFIDENTIALITY: You and/or your care partner have signed paperwork which will be entered into your electronic medical record.  These signatures attest to the fact that that the information above on your After Visit Summary has been reviewed and is understood.  Full responsibility of the confidentiality of this discharge  information lies with you and/or your care-partner.

## 2023-03-21 NOTE — Progress Notes (Signed)
History and Physical Interval Note:  03/21/2023 8:35 AM  Tammy Mullins  has presented today for endoscopic procedure(s), with the diagnosis of  Encounter Diagnoses  Name Primary?   Gastroesophageal reflux disease with esophagitis without hemorrhage Yes   Gastroesophageal reflux disease, unspecified whether esophagitis present   .  The various methods of evaluation and treatment have been discussed with the patient and/or family. After consideration of risks, benefits and other options for treatment, the patient has consented to  the endoscopic procedure(s).   The patient's history has been reviewed, patient examined, no change in status, stable for endoscopic procedure(s).  I have reviewed the patient's chart and labs.  Questions were answered to the patient's satisfaction.     Iva Boop, MD, [facility]

## 2023-03-22 ENCOUNTER — Telehealth: Payer: Self-pay

## 2023-03-22 DIAGNOSIS — K2100 Gastro-esophageal reflux disease with esophagitis, without bleeding: Secondary | ICD-10-CM
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
## 2023-03-22 NOTE — Telephone Encounter (Signed)
I spoke with Tammy Mullins and she wants her 4 hour gastric emptying scan set up for [facility]. I had to leave voice mail messages on both cell and home with the appointment information. Scan is Wednesday 04/05/2023 at [facility], arrive at 7:30am for 8:00am scan, NPO 6 hours. Stop all stomach medicines 8 hours prior to the test. I told her to call me back so I can confirm she got this message.

## 2023-03-22 NOTE — Telephone Encounter (Signed)
Patient called back and verbalized understanding appointment date/time.

## 2023-03-22 NOTE — Telephone Encounter (Signed)
  Follow up Call-     03/21/2023    7:44 AM 05/25/2022    2:14 PM  Call back number  Post procedure Call Back phone  # [phone]  Permission to leave phone message Yes Yes     Patient questions:  Do you have a fever, pain , or abdominal swelling? No. Pain Score  0 *  Have you tolerated food without any problems? Yes.    Have you been able to return to your normal activities? Yes.    Do you have any questions about your discharge instructions: Diet   No. Medications  No. Follow up visit  No.  Do you have questions or concerns about your Care? No.  Actions: * If pain score is 4 or above: No action needed, pain <4.

## 2023-03-24 ENCOUNTER — Ambulatory Visit
Admission: RE | Admit: 2023-03-24 | Discharge: 2023-03-24 | Disposition: A | Discharge status: Home / Self Care | Payer: PPO | Source: Ambulatory Visit | Attending: Surgery | Admitting: Surgery

## 2023-03-24 DIAGNOSIS — M533 Sacrococcygeal disorders, not elsewhere classified: Secondary | ICD-10-CM

## 2023-03-24 MED ORDER — IOPAMIDOL (ISOVUE-M 200) INJECTION 41%
1.0000 mL | Freq: Once | INTRAMUSCULAR | Status: AC | PRN
  Administered 2023-03-24: 1 mL via INTRA_ARTICULAR

## 2023-03-27 ENCOUNTER — Inpatient Hospital Stay: Admission: RE | Admit: 2023-03-27 | Payer: PPO | Source: Ambulatory Visit

## 2023-04-05 ENCOUNTER — Ambulatory Visit (HOSPITAL_COMMUNITY)
Admission: RE | Admit: 2023-04-05 | Discharge: 2023-04-05 | Disposition: A | Discharge status: Home / Self Care | Payer: PPO | Source: Ambulatory Visit | Attending: Internal Medicine | Admitting: Internal Medicine

## 2023-04-05 ENCOUNTER — Ambulatory Visit: Payer: PPO | Admitting: Rheumatology

## 2023-04-05 DIAGNOSIS — K2100 Gastro-esophageal reflux disease with esophagitis, without bleeding: Secondary | ICD-10-CM | POA: Insufficient documentation

## 2023-04-05 DIAGNOSIS — Z8719 Personal history of other diseases of the digestive system: Secondary | ICD-10-CM

## 2023-04-05 DIAGNOSIS — M1A09X0 Idiopathic chronic gout, multiple sites, without tophus (tophi): Secondary | ICD-10-CM

## 2023-04-05 DIAGNOSIS — M797 Fibromyalgia: Secondary | ICD-10-CM

## 2023-04-05 DIAGNOSIS — M5030 Other cervical disc degeneration, unspecified cervical region: Secondary | ICD-10-CM

## 2023-04-05 DIAGNOSIS — F32A Depression, unspecified: Secondary | ICD-10-CM

## 2023-04-05 DIAGNOSIS — Z8679 Personal history of other diseases of the circulatory system: Secondary | ICD-10-CM

## 2023-04-05 DIAGNOSIS — R5383 Other fatigue: Secondary | ICD-10-CM

## 2023-04-05 DIAGNOSIS — Z5181 Encounter for therapeutic drug level monitoring: Secondary | ICD-10-CM

## 2023-04-05 DIAGNOSIS — M19041 Primary osteoarthritis, right hand: Secondary | ICD-10-CM

## 2023-04-05 DIAGNOSIS — M170 Bilateral primary osteoarthritis of knee: Secondary | ICD-10-CM

## 2023-04-05 DIAGNOSIS — F5101 Primary insomnia: Secondary | ICD-10-CM

## 2023-04-05 DIAGNOSIS — G8929 Other chronic pain: Secondary | ICD-10-CM

## 2023-04-05 DIAGNOSIS — G894 Chronic pain syndrome: Secondary | ICD-10-CM

## 2023-04-05 DIAGNOSIS — M47816 Spondylosis without myelopathy or radiculopathy, lumbar region: Secondary | ICD-10-CM

## 2023-04-05 DIAGNOSIS — M8589 Other specified disorders of bone density and structure, multiple sites: Secondary | ICD-10-CM

## 2023-04-05 MED ORDER — TECHNETIUM TC 99M SULFUR COLLOID: 1.8800 | Freq: Once | INTRAVENOUS | Status: DC

## 2023-04-05 MED ORDER — TECHNETIUM TC 99M SULFUR COLLOID
1.8800 | Freq: Once | INTRAVENOUS | Status: AC
  Administered 2023-04-05: 1.88 via ORAL

## 2023-04-07 ENCOUNTER — Telehealth: Payer: Self-pay

## 2023-04-07 DIAGNOSIS — K2100 Gastro-esophageal reflux disease with esophagitis, without bleeding: Secondary | ICD-10-CM

## 2023-04-07 DIAGNOSIS — K31A0 Gastric intestinal metaplasia, unspecified: Secondary | ICD-10-CM

## 2023-04-07 NOTE — Telephone Encounter (Signed)
 EGD instructions typed up and will be mailed out 04/10/2023 for her 05/23/2023 procedure. Amb referral placed. She will call with any questions. She is going to do 24 hours of liquid only diet. Dx: GERD and gastric intestinal metaplasia.

## 2023-04-18 ENCOUNTER — Ambulatory Visit: Payer: PPO | Admitting: Neurology

## 2023-04-24 ENCOUNTER — Ambulatory Visit: Payer: PPO | Admitting: Physician Assistant

## 2023-04-30 ENCOUNTER — Other Ambulatory Visit: Payer: Self-pay | Admitting: Physician Assistant

## 2023-04-30 NOTE — Telephone Encounter (Signed)
 Has appt. tomorrow

## 2023-05-01 ENCOUNTER — Ambulatory Visit: Payer: PPO | Admitting: Physician Assistant

## 2023-05-01 DIAGNOSIS — Z0289 Encounter for other administrative examinations: Secondary | ICD-10-CM

## 2023-05-12 ENCOUNTER — Other Ambulatory Visit: Payer: Self-pay | Admitting: Neurological Surgery

## 2023-05-12 DIAGNOSIS — M533 Sacrococcygeal disorders, not elsewhere classified: Secondary | ICD-10-CM

## 2023-05-16 ENCOUNTER — Other Ambulatory Visit: Payer: Self-pay | Admitting: Physician Assistant

## 2023-05-16 ENCOUNTER — Ambulatory Visit: Admitting: Physician Assistant

## 2023-05-22 NOTE — Progress Notes (Unsigned)
 [facility] History and Physical   Primary Care Physician:  Ceasar Lund, PA   Reason for Procedure:   GERD and gastric intestinal metaplasia  Plan:    EGD     HPI: Tammy Mullins is a 73 y.o. 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 NL GES since - here for repeat EGD, 24 hrs liquid prior and NPO per usual.   Past Medical History:  Diagnosis Date   Allergic rhinitis, cause unspecified    Allergy    Anxiety state, unspecified    Aortic atherosclerosis (HCC) 07/11/2022   Arthritis    Attention deficit disorder without mention of hyperactivity    Barrett's esophagus    Blood transfusion without reported diagnosis 2018-10   Chronic pain syndrome    Colon ulcer - IC valve 08/10/2016   Depression    Diabetes type 2, controlled (HCC) 01/09/2014   Diverticulosis of colon (without mention of hemorrhage)    Esophageal reflux    Fibromyalgia    Gout, unspecified    Headache    Hiatal hernia    Irritable bowel syndrome    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    Myalgia and myositis, unspecified    Osteoarthrosis, unspecified whether generalized or localized, unspecified site    Personal history of unspecified circulatory disease    Pure hypercholesterolemia    Radial styloid tenosynovitis    Unspecified essential hypertension    Unspecified nonpsychotic mental disorder    Unspecified pruritic disorder     Past Surgical History:  Procedure Laterality Date   ANKLE SURGERY Right    APPENDECTOMY     BACK SURGERY  03/19/2021   CARPAL TUNNEL RELEASE Right    CERVICAL SPINE SURGERY     plates and pins   CHOLECYSTECTOMY N/A 06/26/2015   Procedure: LAPAROSCOPIC CHOLECYSTECTOMY;  Surgeon: Axel Filler, MD;  Location: [facility] OR;  Service: General;  Laterality: N/A;   COLONOSCOPY     COLONOSCOPY  2019   ESOPHAGOGASTRODUODENOSCOPY     ESOPHAGOGASTRODUODENOSCOPY N/A  12/04/2016   Procedure: ESOPHAGOGASTRODUODENOSCOPY (EGD);  Surgeon: Meryl Dare, MD;  Location: [facility] ENDOSCOPY;  Service: Endoscopy;  Laterality: N/A;   LAPAROSCOPIC APPENDECTOMY N/A 06/26/2015   Procedure: LAPAROSCOPIC APPENDECTOMY;  Surgeon: Axel Filler, MD;  Location: [facility] OR;  Service: General;  Laterality: N/A;   ROTATOR CUFF REPAIR Right    SALPINGOOPHORECTOMY  Left   ectopic 1980   TOTAL SHOULDER ARTHROPLASTY     TRANSFORAMINAL LUMBAR INTERBODY FUSION (TLIF) WITH PEDICLE SCREW FIXATION 2 LEVEL N/A 01/28/2019   Procedure: TRANSFORAMINAL LUMBAR INTERBODY FUSION LUMBAR THREE-FOUR, LUMBAR FOUR-FIVE;  Surgeon: Tia Alert, MD;  Location[facility] OR;  Service: Neurosurgery;  Laterality: N/A;  posterior   UPPER GASTROINTESTINAL ENDOSCOPY      Prior to Admission medications   Medication Sig Start Date End Date Taking? Authorizing Provider  allopurinol (ZYLOPRIM) 300 MG tablet TAKE 1 AND 1/2 TABLETS(450 MG) BY MOUTH DAILY 03/31/22   Pollyann Savoy, MD  amLODipine (NORVASC) 10 MG tablet TAKE 1 TABLET DAILY 10/21/13   Sandford Craze, NP  Blood Glucose Monitoring Suppl (ONETOUCH VERIO FLEX SYSTEM) w/Device KIT Use as directed Patient not taking: Reported on 03/21/2023    [provider]  buPROPion (WELLBUTRIN XL) 150 MG 24 hr tablet Take 3 tablets (450 mg total) by mouth daily. 12/22/22   Melony Overly T, PA-C  DULoxetine (CYMBALTA) 60 MG capsule Take 2 capsules (  120 mg total) by mouth daily. 12/22/22   Cherie Ouch, PA-C  famotidine (PEPCID) 40 MG tablet Take 40 mg by mouth daily.    [provider]  ferrous sulfate 325 (65 FE) MG tablet Take 325 mg by mouth daily with breakfast.    [provider]  gabapentin (NEURONTIN) 300 MG capsule Take 2 capsules (600 mg total) by mouth 3 (three) times daily. 07/12/21   Glean Salvo, NP  gabapentin (NEURONTIN) 600 MG tablet Take 600 mg by mouth every 8 (eight) hours. Patient not taking: Reported on 03/21/2023 02/27/23    [provider]  glucose blood (ONETOUCH VERIO) test strip Use as instructed Patient not taking: Reported on 03/21/2023    [provider]  hydrochlorothiazide (HYDRODIURIL) 50 MG tablet Take 50 mg by mouth daily.    [provider]  hyoscyamine (LEVSIN SL) 0.125 MG SL tablet SMARTSIG:1-2 Tablet(s) Sublingual Every 4-6 Hours PRN 03/29/22   [provider]  Lancets (ONETOUCH ULTRASOFT) lancets Use as instructed Patient not taking: Reported on 03/21/2023    [provider]  losartan (COZAAR) 100 MG tablet 1 tablet by mouth every morning    [provider]  methocarbamol (ROBAXIN) 500 MG tablet Take 500 mg by mouth every 8 (eight) hours as needed. 12/28/22   [provider]  methocarbamol (ROBAXIN) 750 MG tablet Take 750 mg by mouth every 6 (six) hours as needed for muscle spasms. Patient not taking: Reported on 03/21/2023 05/23/19   [provider]  metoprolol (TOPROL-XL) 200 MG 24 hr tablet Take 200 mg by mouth daily. 04/11/22   [provider]  omeprazole (PRILOSEC) 40 MG capsule Take 1 capsule (40 mg total) by mouth in the morning and at bedtime. 03/09/23   May, Deanna J, NP  QUEtiapine (SEROQUEL) 400 MG tablet TAKE 1 TABLET(400 MG) BY MOUTH AT BEDTIME 05/17/23   Hurst, Teresa T, PA-C  rosuvastatin (CRESTOR) 5 MG tablet Take 5 mg by mouth once a week.    [provider]  zonisamide (ZONEGRAN) 100 MG capsule 1 capsule Orally at bedtime as needed    [provider]    Current Outpatient Medications  Medication Sig Dispense Refill   allopurinol (ZYLOPRIM) 300 MG tablet TAKE 1 AND 1/2 TABLETS(450 MG) BY MOUTH DAILY 45 tablet 2   amLODipine (NORVASC) 10 MG tablet TAKE 1 TABLET DAILY 90 tablet 1   buPROPion (WELLBUTRIN XL) 150 MG 24 hr tablet Take 3 tablets (450 mg total) by mouth daily. 270 tablet 1   DULoxetine (CYMBALTA) 60 MG capsule Take 2 capsules (120 mg total) by mouth daily. 180 capsule 1    famotidine (PEPCID) 40 MG tablet Take 40 mg by mouth daily.     ferrous sulfate 325 (65 FE) MG tablet Take 325 mg by mouth daily with breakfast.     gabapentin (NEURONTIN) 300 MG capsule Take 2 capsules (600 mg total) by mouth 3 (three) times daily. 180 capsule 1   hydrochlorothiazide (HYDRODIURIL) 50 MG tablet Take 50 mg by mouth daily.     hyoscyamine (LEVSIN SL) 0.125 MG SL tablet SMARTSIG:1-2 Tablet(s) Sublingual Every 4-6 Hours PRN     losartan (COZAAR) 100 MG tablet 1 tablet by mouth every morning     methocarbamol (ROBAXIN) 500 MG tablet Take 500 mg by mouth every 8 (eight) hours as needed.     metoprolol (TOPROL-XL) 200 MG 24 hr tablet Take 200 mg by mouth daily.     omeprazole (PRILOSEC) 40 MG  capsule Take 1 capsule (40 mg total) by mouth in the morning and at bedtime. 180 capsule 3   QUEtiapine (SEROQUEL) 400 MG tablet TAKE 1 TABLET(400 MG) BY MOUTH AT BEDTIME 30 tablet 1   rosuvastatin (CRESTOR) 5 MG tablet Take 5 mg by mouth once a week.     zonisamide (ZONEGRAN) 100 MG capsule 1 capsule Orally at bedtime as needed     Blood Glucose Monitoring Suppl (ONETOUCH VERIO FLEX SYSTEM) w/Device KIT Use as directed (Patient not taking: Reported on 03/21/2023)     gabapentin (NEURONTIN) 600 MG tablet Take 600 mg by mouth every 8 (eight) hours. (Patient not taking: Reported on 05/23/2023)     glucose blood (ONETOUCH VERIO) test strip Use as instructed (Patient not taking: Reported on 03/21/2023)     Lancets (ONETOUCH ULTRASOFT) lancets Use as instructed (Patient not taking: Reported on 03/21/2023)     methocarbamol (ROBAXIN) 750 MG tablet Take 750 mg by mouth every 6 (six) hours as needed for muscle spasms. (Patient not taking: Reported on 03/21/2023)     Current Facility-Administered Medications  Medication Dose Route Frequency Provider Last Rate Last Admin   0.9 %  sodium chloride infusion  500 mL Intravenous Once Iva Boop, MD        Allergies as of 05/23/2023 - Review Complete 05/23/2023   Allergen Reaction Noted   Crestor [rosuvastatin calcium] Other (See Comments) 11/04/2010   Dicyclomine Other (See Comments) 09/09/2021   Topiramate Diarrhea 04/15/2019   Asa [aspirin] Other (See Comments) 06/30/2016   Celebrex [celecoxib] Swelling and Rash 01/15/2019    Family History  Problem Relation Age of Onset   Lung disease Mother        ?    Glaucoma Mother    Cirrhosis Father    Hypertension Sister    Heart disease Sister    Heart attack Sister    Hypertension Brother    Glaucoma Brother    Prostate cancer Brother    Hypertension Brother    Cancer Maternal Grandmother        ?   Kidney disease Neg Hx    Diabetes Neg Hx    Colon cancer Neg Hx    Colon polyps Neg Hx    Rectal cancer Neg Hx    Stomach cancer Neg Hx     Social History   Socioeconomic History   Marital status: Single    Spouse name: Not on file   Number of children: 0   Years of education: 12   Highest education level: High school graduate  Occupational History   Occupation: retired  Tobacco Use   Smoking status: Never    Passive exposure: Never   Smokeless tobacco: Never  Vaping Use   Vaping status: Never Used  Substance and Sexual Activity   Alcohol use: No   Drug use: No   Sexual activity: Never  Other Topics Concern   Not on file  Social History Narrative   No children   Husband died 2002/05/29   Niece is staying with her   She enjoys bowling, lunch with friends/sister, cards, church   Has a boyfriend who has his own house   Helps family members with driving   Retired, Worked in Psychologist, 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  [place]   No daily use of caffeine.   Right-handed.   Lives alone.         Social Drivers of Corporate investment banker Strain: Not on file  Food Insecurity: No Food Insecurity (07/11/2022)   Hunger Vital Sign    Worried About Running Out of Food in the Last Year: Never true    Ran Out of Food in the Last Year: Never true   Transportation Needs: No Transportation Needs (07/11/2022)   PRAPARE - Administrator, Civil Service (Medical): No    Lack of Transportation (Non-Medical): No  Physical Activity: Inactive (07/11/2022)   Exercise Vital Sign    Days of Exercise per Week: 0 days    Minutes of Exercise per Session: 0 min  Stress: Not on file  Social Connections: Not on file  Intimate Partner Violence: Not on file    Review of Systems:  All other review of systems negative except as mentioned in the HPI.  Physical Exam: Vital signs BP 133/72   Pulse 95   Temp 98.4 F (36.9 C)   Resp 20   Ht 5\' 5"  (1.651 m)   Wt 176 lb (79.8 kg)   SpO2 94%   BMI 29.29 kg/m   General:   Alert,  Well-developed, well-nourished, pleasant and cooperative in NAD Lungs:  Clear throughout to auscultation.   Heart:  Regular rate and rhythm; no murmurs, clicks, rubs,  or gallops. Abdomen:  Soft, nontender and nondistended. Normal bowel sounds.   Neuro/Psych:  Alert and cooperative. Normal mood and affect. A and O x 3   @[place] Fosse  Sena Slate, MD, [facility] [phone] (pager) 05/23/2023 8:37 AM@

## 2023-05-23 ENCOUNTER — Encounter: Payer: Self-pay | Admitting: Internal Medicine

## 2023-05-23 ENCOUNTER — Ambulatory Visit (AMBULATORY_SURGERY_CENTER): Payer: PPO | Admitting: Internal Medicine

## 2023-05-23 VITALS — BP 131/79 | HR 95 | Temp 98.4°F | Resp 14 | Ht 65.0 in | Wt 176.0 lb

## 2023-05-23 DIAGNOSIS — K3189 Other diseases of stomach and duodenum: Secondary | ICD-10-CM

## 2023-05-23 DIAGNOSIS — K31A11 Gastric intestinal metaplasia without dysplasia, involving the antrum: Secondary | ICD-10-CM | POA: Diagnosis not present

## 2023-05-23 DIAGNOSIS — K2100 Gastro-esophageal reflux disease with esophagitis, without bleeding: Secondary | ICD-10-CM

## 2023-05-23 DIAGNOSIS — T182XXA Foreign body in stomach, initial encounter: Secondary | ICD-10-CM

## 2023-05-23 DIAGNOSIS — K31A15 Gastric intestinal metaplasia without dysplasia, involving multiple sites: Secondary | ICD-10-CM

## 2023-05-23 DIAGNOSIS — K2950 Unspecified chronic gastritis without bleeding: Secondary | ICD-10-CM | POA: Diagnosis not present

## 2023-05-23 DIAGNOSIS — K317 Polyp of stomach and duodenum: Secondary | ICD-10-CM

## 2023-05-23 DIAGNOSIS — K449 Diaphragmatic hernia without obstruction or gangrene: Secondary | ICD-10-CM

## 2023-05-23 MED ORDER — SODIUM CHLORIDE 0.9 % IV SOLN
500.0000 mL | Freq: Once | INTRAVENOUS | Status: DC
Start: 2023-05-23 — End: 2023-05-23

## 2023-05-23 NOTE — Progress Notes (Signed)
 Pt's states no medical or surgical changes since previsit or office visit.

## 2023-05-23 NOTE — Progress Notes (Signed)
 Sedate, gd SR, tolerated procedure well, VSS, report to RN

## 2023-05-23 NOTE — Progress Notes (Signed)
 Called to room to assist during endoscopic procedure.  Patient ID and intended procedure confirmed with present staff. Received instructions for my participation in the procedure from the performing physician.

## 2023-05-23 NOTE — Patient Instructions (Addendum)
 Please read handouts provided. Continue present medications. Await pathology results.   YOU HAD AN ENDOSCOPIC PROCEDURE TODAY AT THE [facility]:   Refer to the procedure report that was given to you for any specific questions about what was found during the examination.  If the procedure report does not answer your questions, please call your gastroenterologist to clarify.  If you requested that your care partner not be given the details of your procedure findings, then the procedure report has been included in a sealed envelope for you to review at your convenience later.  YOU SHOULD EXPECT: Some feelings of bloating in the abdomen. Passage of more gas than usual.  Walking can help get rid of the air that was put into your GI tract during the procedure and reduce the bloating. If you had a lower endoscopy (such as a colonoscopy or flexible sigmoidoscopy) you may notice spotting of blood in your stool or on the toilet paper. If you underwent a bowel prep for your procedure, you may not have a normal bowel movement for a few days.  Please Note:  You might notice some irritation and congestion in your nose or some drainage.  This is from the oxygen used during your procedure.  There is no need for concern and it should clear up in a day or so.  SYMPTOMS TO REPORT IMMEDIATELY:  Following upper endoscopy (EGD)  Vomiting of blood or coffee ground material  New chest pain or pain under the shoulder blades  Painful or persistently difficult swallowing  New shortness of breath  Fever of 100F or higher  Black, tarry-looking stools  For urgent or emergent issues, a gastroenterologist can be reached at any hour by calling [phone]. Do not use MyChart messaging for urgent concerns.    DIET:  We do recommend a small meal at first, but then you may proceed to your regular diet.  Drink plenty of fluids but you should avoid alcoholic beverages for 24 hours.  ACTIVITY:  You should plan  to take it easy for the rest of today and you should NOT DRIVE or use heavy machinery until tomorrow (because of the sedation medicines used during the test).    FOLLOW UP: Our staff will call the number listed on your records the next business day following your procedure.  We will call around 7:15- 8:00 am to check on you and address any questions or concerns that you may have regarding the information given to you following your procedure. If we do not reach you, we will leave a message.     If any biopsies were taken you will be contacted by phone or by letter within the next 1-3 weeks.  Please call us at [phone] if you have not heard about the biopsies in 3 weeks.    SIGNATURES/CONFIDENTIALITY: You and/or your care partner have signed paperwork which will be entered into your electronic medical record.  These signatures attest to the fact that that the information above on your After Visit Summary has been reviewed and is understood.  Full responsibility of the confidentiality of this discharge information lies with you and/or your care-partner.There was still some food in the stomach.  The esophagus looks good.  Stomach inflamed as in past - biopsies taken and biopsy taken where polyp was removed.  I will communicate results and plans.  I appreciate the opportunity to care for you. Iva Boop, MD, [facility]

## 2023-05-23 NOTE — Op Note (Signed)
 [facility] Patient Name: Tammy Mullins Procedure Date: 05/23/2023 8:24 AM MRN: [id number] Endoscopist: Iva Boop , MD, [id number] Age: 73 Referring MD:  Date of Birth: 04/03/1950 Gender: Female Account #: [id number] Procedure:                Upper GI endoscopy Indications:              Gastro-esophageal reflux disease, Follow-up of                            gastro-esophageal reflux disease, Gastritis,                            Follow-up of gastritis, Gastric intestinal                            metaplasia without dysplasia Medicines:                Monitored Anesthesia Care Procedure:                Pre-Anesthesia Assessment:                           - Prior to the procedure, a History and Physical                            was performed, and patient medications and                            allergies were reviewed. The patient's tolerance of                            previous anesthesia was also reviewed. The risks                            and benefits of the procedure and the sedation                            options and risks were discussed with the patient.                            All questions were answered, and informed consent                            was obtained. Prior Anticoagulants: The patient has                            taken no anticoagulant or antiplatelet agents. ASA                            Grade Assessment: II - A patient with mild systemic                            disease. After reviewing the risks and benefits,  the patient was deemed in satisfactory condition to                            undergo the procedure.                           After obtaining informed consent, the endoscope was                            passed under direct vision. Throughout the                            procedure, the patient's blood pressure, pulse, and                            oxygen saturations were monitored  continuously. The                            GIF [id number] #[id number] was introduced through the                            mouth, and advanced to the second part of duodenum.                            The upper GI endoscopy was accomplished without                            difficulty. The patient tolerated the procedure                            well. Scope In: Scope Out: Findings:                 The examined esophagus was normal.                           A 5 cm hiatal hernia was present.                           A small amount of food (residue) was found in the                            gastric body and in the gastric antrum. limited                            exam soewhat but witj lavage, suction and movement                            adequate views obtained.                           Diffuse moderate inflammation characterized by                            congestion (edema), erythema and friability was  found in the entire examined stomach. Biopsies were                            taken with a cold forceps for histology.                            Verification of patient identification for the                            specimen was done. Estimated blood loss was minimal.                           A single 6 mm sessile polyp with stigmata of recent                            bleeding was found in the prepyloric region of the                            stomach. Biopsies were taken with a cold forceps                            for histology. Verification of patient                            identification for the specimen was done. Estimated                            blood loss was minimal.                           The gastroesophageal flap valve was visualized                            endoscopically and classified as Hill Grade IV (no                            fold, wide open lumen, hiatal hernia present).                           The cardia and  gastric fundus were otherwise normal                            on retroflexion. Complications:            No immediate complications. Estimated Blood Loss:     Estimated blood loss was minimal. Impression:               - Normal esophagus. Reflux esophagitis is healed.                           - 5 cm hiatal hernia.                           - A small amount of food (residue) in the stomach.  Limited exam somewhat - overall adequate views I                            think. She had food retained in january - gastric                            emptying study was normal ? Asked to go on liquids                            24 hrs before this exam.                           - Chronic gastritis. Biopsied.                           - A single gastric polyp. Biopsied. This was where                            hyperplastic polyp with GIM was snared 2024.                           - Gastroesophageal flap valve classified as Hill                            Grade IV (no fold, wide open lumen, hiatal hernia                            present). Recommendation:           - Patient has a contact number available for                            emergencies. The signs and symptoms of potential                            delayed complications were discussed with the                            patient. Return to normal activities tomorrow.                            Written discharge instructions were provided to the                            patient.                           - Resume previous diet.                           - Continue present medications.                           - Await pathology results. Iva Boop, MD 05/23/2023 9:04:22 AM This report has been signed electronically.

## 2023-05-24 ENCOUNTER — Telehealth: Payer: Self-pay | Admitting: *Deleted

## 2023-05-24 NOTE — Telephone Encounter (Signed)
  Follow up Call-     05/23/2023    7:53 AM 03/21/2023    7:44 AM 05/25/2022    2:14 PM  Call back number  Post procedure Call Back phone  # [phone]  Permission to leave phone message Yes Yes Yes     Patient questions:  Do you have a fever, pain , or abdominal swelling? No. Pain Score  0 *  Have you tolerated food without any problems? Yes.    Have you been able to return to your normal activities? Yes.    Do you have any questions about your discharge instructions: Diet   No. Medications  No. Follow up visit  No.  Do you have questions or concerns about your Care? No.  Actions: * If pain score is 4 or above: No action needed, pain <4.

## 2023-05-25 LAB — SURGICAL PATHOLOGY
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
## 2023-05-26 ENCOUNTER — Encounter: Payer: Self-pay | Admitting: Internal Medicine

## 2023-05-26 ENCOUNTER — Ambulatory Visit: Admitting: Rheumatology

## 2023-05-26 DIAGNOSIS — M47816 Spondylosis without myelopathy or radiculopathy, lumbar region: Secondary | ICD-10-CM

## 2023-05-26 DIAGNOSIS — F5101 Primary insomnia: Secondary | ICD-10-CM

## 2023-05-26 DIAGNOSIS — G894 Chronic pain syndrome: Secondary | ICD-10-CM

## 2023-05-26 DIAGNOSIS — M797 Fibromyalgia: Secondary | ICD-10-CM

## 2023-05-26 DIAGNOSIS — M1A09X0 Idiopathic chronic gout, multiple sites, without tophus (tophi): Secondary | ICD-10-CM

## 2023-05-26 DIAGNOSIS — R5383 Other fatigue: Secondary | ICD-10-CM

## 2023-05-26 DIAGNOSIS — Z5181 Encounter for therapeutic drug level monitoring: Secondary | ICD-10-CM

## 2023-05-26 DIAGNOSIS — M19041 Primary osteoarthritis, right hand: Secondary | ICD-10-CM

## 2023-05-26 DIAGNOSIS — M170 Bilateral primary osteoarthritis of knee: Secondary | ICD-10-CM

## 2023-05-26 DIAGNOSIS — M5030 Other cervical disc degeneration, unspecified cervical region: Secondary | ICD-10-CM

## 2023-05-26 DIAGNOSIS — G8929 Other chronic pain: Secondary | ICD-10-CM

## 2023-05-26 DIAGNOSIS — Z8719 Personal history of other diseases of the digestive system: Secondary | ICD-10-CM

## 2023-05-26 DIAGNOSIS — M8589 Other specified disorders of bone density and structure, multiple sites: Secondary | ICD-10-CM

## 2023-05-26 DIAGNOSIS — Z8679 Personal history of other diseases of the circulatory system: Secondary | ICD-10-CM

## 2023-05-26 DIAGNOSIS — F419 Anxiety disorder, unspecified: Secondary | ICD-10-CM

## 2023-06-05 ENCOUNTER — Other Ambulatory Visit: Payer: Self-pay | Admitting: Neurology
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
## 2023-06-07 ENCOUNTER — Encounter: Payer: Self-pay | Admitting: Neurology

## 2023-06-07 ENCOUNTER — Ambulatory Visit: Payer: PPO | Admitting: Neurology

## 2023-06-09 ENCOUNTER — Other Ambulatory Visit
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
## 2023-06-12 ENCOUNTER — Ambulatory Visit: Attending: Physician Assistant | Admitting: Physician Assistant

## 2023-06-12 ENCOUNTER — Encounter: Payer: Self-pay | Admitting: Physician Assistant

## 2023-06-12 VITALS — BP 171/96 | HR 98 | Resp 17 | Ht 65.5 in | Wt 177.2 lb

## 2023-06-12 DIAGNOSIS — M25511 Pain in right shoulder: Secondary | ICD-10-CM | POA: Diagnosis not present

## 2023-06-12 DIAGNOSIS — M1A09X0 Idiopathic chronic gout, multiple sites, without tophus (tophi): Secondary | ICD-10-CM

## 2023-06-12 DIAGNOSIS — M797 Fibromyalgia: Secondary | ICD-10-CM

## 2023-06-12 DIAGNOSIS — G8929 Other chronic pain: Secondary | ICD-10-CM

## 2023-06-12 DIAGNOSIS — Z8679 Personal history of other diseases of the circulatory system: Secondary | ICD-10-CM

## 2023-06-12 DIAGNOSIS — F32A Depression, unspecified: Secondary | ICD-10-CM

## 2023-06-12 DIAGNOSIS — F5101 Primary insomnia: Secondary | ICD-10-CM

## 2023-06-12 DIAGNOSIS — M170 Bilateral primary osteoarthritis of knee: Secondary | ICD-10-CM

## 2023-06-12 DIAGNOSIS — M19042 Primary osteoarthritis, left hand: Secondary | ICD-10-CM

## 2023-06-12 DIAGNOSIS — M8589 Other specified disorders of bone density and structure, multiple sites: Secondary | ICD-10-CM

## 2023-06-12 DIAGNOSIS — M19041 Primary osteoarthritis, right hand: Secondary | ICD-10-CM

## 2023-06-12 DIAGNOSIS — F419 Anxiety disorder, unspecified: Secondary | ICD-10-CM

## 2023-06-12 DIAGNOSIS — Z5181 Encounter for therapeutic drug level monitoring: Secondary | ICD-10-CM

## 2023-06-12 DIAGNOSIS — R5383 Other fatigue: Secondary | ICD-10-CM

## 2023-06-12 DIAGNOSIS — M5030 Other cervical disc degeneration, unspecified cervical region: Secondary | ICD-10-CM

## 2023-06-12 DIAGNOSIS — G894 Chronic pain syndrome: Secondary | ICD-10-CM

## 2023-06-12 DIAGNOSIS — M47816 Spondylosis without myelopathy or radiculopathy, lumbar region: Secondary | ICD-10-CM

## 2023-06-12 DIAGNOSIS — Z8719 Personal history of other diseases of the digestive system: Secondary | ICD-10-CM

## 2023-06-12 MED ORDER — ALLOPURINOL 300 MG PO TABS: ORAL_TABLET | ORAL | 2 refills | Status: DC

## 2023-06-13 LAB — CBC WITH DIFFERENTIAL/PLATELET
Absolute Lymphocytes: 1871 {cells}/uL (ref 850–3900)
Absolute Monocytes: 424 {cells}/uL (ref 200–950)
Basophils Absolute: 23 {cells}/uL (ref 0–200)
Basophils Relative: 0.3 %
Eosinophils Absolute: 254 {cells}/uL (ref 15–500)
Eosinophils Relative: 3.3 %
HCT: 36.7 % (ref 35.0–45.0)
Hemoglobin: 11.9 g/dL (ref 11.7–15.5)
MCH: 29.8 pg (ref 27.0–33.0)
MCHC: 32.4 g/dL (ref 32.0–36.0)
MCV: 92 fL (ref 80.0–100.0)
MPV: 9.8 fL (ref 7.5–12.5)
Monocytes Relative: 5.5 %
Neutro Abs: 5128 {cells}/uL (ref 1500–7800)
Neutrophils Relative %: 66.6 %
Platelets: 268 10*3/uL (ref 140–400)
RBC: 3.99 10*6/uL (ref 3.80–5.10)
RDW: 13.3 % (ref 11.0–15.0)
Total Lymphocyte: 24.3 %
WBC: 7.7 10*3/uL (ref 3.8–10.8)

## 2023-06-13 LAB — COMPREHENSIVE METABOLIC PANEL WITH GFR
AG Ratio: 1.6 (calc) (ref 1.0–2.5)
ALT: 12 U/L (ref 6–29)
AST: 11 U/L (ref 10–35)
Albumin: 4.2 g/dL (ref 3.6–5.1)
Alkaline phosphatase (APISO): 74 U/L (ref 37–153)
BUN: 17 mg/dL (ref 7–25)
CO2: 28 mmol/L (ref 20–32)
Calcium: 10.3 mg/dL (ref 8.6–10.4)
Chloride: 106 mmol/L (ref 98–110)
Creat: 0.97 mg/dL (ref 0.60–1.00)
Globulin: 2.6 g/dL (ref 1.9–3.7)
Glucose, Bld: 103 mg/dL — ABNORMAL HIGH (ref 65–99)
Potassium: 4.7 mmol/L (ref 3.5–5.3)
Sodium: 141 mmol/L (ref 135–146)
Total Bilirubin: 0.3 mg/dL (ref 0.2–1.2)
Total Protein: 6.8 g/dL (ref 6.1–8.1)
eGFR: 61 mL/min/{1.73_m2} (ref >=60)

## 2023-06-13 LAB — URIC ACID: Uric Acid, Serum: 5.5 mg/dL (ref 2.5–7.0)

## 2023-06-13 NOTE — Progress Notes (Signed)
 CMP WNL CBC WNL Uric acid WNL

## 2023-06-14 ENCOUNTER — Telehealth: Payer: Self-pay | Admitting: Neurology

## 2023-06-14 NOTE — Telephone Encounter (Signed)
 1 yr f/u made with wait list

## 2023-06-19 ENCOUNTER — Ambulatory Visit: Admitting: Physician Assistant

## 2023-06-19 ENCOUNTER — Encounter: Payer: Self-pay | Admitting: Physician Assistant

## 2023-06-19 DIAGNOSIS — F5105 Insomnia due to other mental disorder: Secondary | ICD-10-CM | POA: Diagnosis not present

## 2023-06-19 DIAGNOSIS — F3181 Bipolar II disorder: Secondary | ICD-10-CM

## 2023-06-19 DIAGNOSIS — F99 Mental disorder, not otherwise specified: Secondary | ICD-10-CM | POA: Diagnosis not present

## 2023-06-19 DIAGNOSIS — G894 Chronic pain syndrome: Secondary | ICD-10-CM | POA: Diagnosis not present

## 2023-06-19 MED ORDER — BUPROPION HCL ER (XL) 150 MG PO TB24: 450.0000 mg | ORAL_TABLET | Freq: Every day | ORAL | 1 refills | Status: DC

## 2023-06-19 MED ORDER — DULOXETINE HCL 60 MG PO CPEP: 120.0000 mg | ORAL_CAPSULE | Freq: Every day | ORAL | 1 refills | Status: DC

## 2023-06-19 MED ORDER — QUETIAPINE FUMARATE 300 MG PO TABS: 600.0000 mg | ORAL_TABLET | Freq: Every day | ORAL | 1 refills | Status: DC
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
## 2023-06-20 ENCOUNTER — Telehealth: Payer: Self-pay | Admitting: Internal Medicine

## 2023-06-20 NOTE — Telephone Encounter (Signed)
 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 She will follow up in office in 1 year and EGD in 3 years

## 2023-06-20 NOTE — Telephone Encounter (Signed)
 Inbound call from patient requesting a call back to discuss results from 3/25 EGD. Please advise.

## 2023-06-27 ENCOUNTER — Other Ambulatory Visit

## 2023-06-29 ENCOUNTER — Other Ambulatory Visit: Payer: Self-pay | Admitting: Physician Assistant

## 2023-08-14 ENCOUNTER — Ambulatory Visit: Admitting: Physician Assistant

## 2023-08-22 ENCOUNTER — Other Ambulatory Visit: Payer: Self-pay | Admitting: Physician Assistant

## 2023-09-26 ENCOUNTER — Other Ambulatory Visit: Payer: Self-pay | Admitting: Physician Assistant

## 2023-09-27 ENCOUNTER — Other Ambulatory Visit: Payer: Self-pay | Admitting: Neurosurgery

## 2023-09-27 DIAGNOSIS — M533 Sacrococcygeal disorders, not elsewhere classified: Secondary | ICD-10-CM

## 2023-09-27 NOTE — Telephone Encounter (Signed)
Please call to schedule FU, is past due.

## 2023-09-29 NOTE — Telephone Encounter (Signed)
Scheduled 8/7  °

## 2023-10-05 ENCOUNTER — Ambulatory Visit: Admitting: Physician Assistant

## 2023-10-05 ENCOUNTER — Encounter: Payer: Self-pay | Admitting: Physician Assistant

## 2023-10-05 DIAGNOSIS — F5105 Insomnia due to other mental disorder: Secondary | ICD-10-CM

## 2023-10-05 DIAGNOSIS — F3181 Bipolar II disorder: Secondary | ICD-10-CM

## 2023-10-05 DIAGNOSIS — F99 Mental disorder, not otherwise specified: Secondary | ICD-10-CM | POA: Diagnosis not present

## 2023-10-05 DIAGNOSIS — G894 Chronic pain syndrome: Secondary | ICD-10-CM | POA: Diagnosis not present

## 2023-10-05 MED ORDER — QUETIAPINE FUMARATE 300 MG PO TABS: 600.0000 mg | ORAL_TABLET | Freq: Every day | ORAL | 1 refills | Status: DC

## 2023-10-05 MED ORDER — BUPROPION HCL ER (XL) 150 MG PO TB24
450.0000 mg | ORAL_TABLET | Freq: Every day | ORAL | 1 refills | Status: AC
Start: 2023-10-05 — End: ?

## 2023-10-05 MED ORDER — DULOXETINE HCL 60 MG PO CPEP: 120.0000 mg | ORAL_CAPSULE | Freq: Every day | ORAL | 1 refills | Status: AC

## 2023-10-05 NOTE — Progress Notes (Signed)
 [facility]  Patient ID: Tammy Mullins,  MRN: [id number]  PCP: Wonda Worth SQUIBB, PA  Date of Evaluation: 10/05/2023 Time spent:20 minutes  Chief Complaint:  Chief Complaint   Depression; Follow-up    HISTORY/CURRENT STATUS:  HPI  overdue for f/u appt  We increased the Seroquel  in April.  States she feels a lot better.  Enjoys going out with friends occasionally.  No extreme sadness, tearfulness, or feelings of hopelessness.  Sleeps really well since increasing the Seroquel .  Personal hygiene is normal.   Denies any changes in concentration, making decisions, or remembering things.  Appetite has not changed.  Weight is stable.  No mania, delirium, AH/VH.  No SI/HI.  Individual Medical History/ Review of Systems: Changes? :Yes   chronic low back pain   Past medications for mental health diagnoses include: Cymbalta , Seroquel , Gabapentin , Wellbutrin   Allergies: Crestor  [rosuvastatin  calcium ], Dicyclomine , Topiramate , Asa [aspirin ], and Celebrex [celecoxib]  Current Medications:  Current Outpatient Medications:    allopurinol  (ZYLOPRIM ) 300 MG tablet, TAKE 1 AND 1/2 TABLETS(450 MG) BY MOUTH DAILY, Disp: 45 tablet, Rfl: 2   amLODipine  (NORVASC ) 10 MG tablet, TAKE 1 TABLET DAILY, Disp: 90 tablet, Rfl: 1   Blood Glucose Monitoring Suppl (ONETOUCH VERIO FLEX SYSTEM) w/Device KIT, Use as directed, Disp: , Rfl:    famotidine (PEPCID) 40 MG tablet, Take 40 mg by mouth daily., Disp: , Rfl:    ferrous sulfate  325 (65 FE) MG tablet, Take 325 mg by mouth daily with breakfast., Disp: , Rfl:    gabapentin  (NEURONTIN ) 300 MG capsule, Take 2 capsules (600 mg total) by mouth 3 (three) times daily., Disp: 180 capsule, Rfl: 1   glucose blood (ONETOUCH VERIO) test strip, Use as instructed, Disp: , Rfl:    hydrochlorothiazide  (HYDRODIURIL ) 50 MG tablet, Take 50 mg by mouth daily., Disp: , Rfl:    hyoscyamine  (LEVSIN  SL) 0.125 MG SL tablet, SMARTSIG:1-2 Tablet(s) Sublingual Every 4-6 Hours PRN,  Disp: , Rfl:    Lancets (ONETOUCH ULTRASOFT) lancets, Use as instructed, Disp: , Rfl:    losartan  (COZAAR ) 100 MG tablet, 1 tablet by mouth every morning, Disp: , Rfl:    methocarbamol  (ROBAXIN ) 500 MG tablet, Take 500 mg by mouth every 8 (eight) hours as needed., Disp: , Rfl:    metoprolol  (TOPROL -XL) 200 MG 24 hr tablet, Take 200 mg by mouth daily., Disp: , Rfl:    omeprazole  (PRILOSEC) 40 MG capsule, Take 1 capsule (40 mg total) by mouth in the morning and at bedtime., Disp: 180 capsule, Rfl: 3   rosuvastatin  (CRESTOR ) 5 MG tablet, Take 5 mg by mouth once a week., Disp: , Rfl:    zonisamide  (ZONEGRAN ) 100 MG capsule, TAKE 1 CAPSULE(100 MG) BY MOUTH AT BEDTIME, Disp: 30 capsule, Rfl: 11   buPROPion  (WELLBUTRIN  XL) 150 MG 24 hr tablet, Take 3 tablets (450 mg total) by mouth daily., Disp: 270 tablet, Rfl: 1   DULoxetine  (CYMBALTA ) 60 MG capsule, Take 2 capsules (120 mg total) by mouth daily., Disp: 180 capsule, Rfl: 1   gabapentin  (NEURONTIN ) 600 MG tablet, Take 600 mg by mouth every 8 (eight) hours. (Patient not taking: Reported on 06/19/2023), Disp: , Rfl:    methocarbamol  (ROBAXIN ) 750 MG tablet, Take 750 mg by mouth every 6 (six) hours as needed for muscle spasms. (Patient not taking: Reported on 03/21/2023), Disp: , Rfl:    QUEtiapine  (SEROQUEL ) 300 MG tablet, Take 2 tablets (600 mg total) by mouth at bedtime., Disp: 180 tablet, Rfl: 1 Medication Side  Effects: none  Family Medical/ Social History: Changes?  no  MENTAL HEALTH EXAM:  There were no vitals taken for this visit.There is no height or weight on file to calculate BMI.  General Appearance: Casual and Well Groomed  Eye Contact:  Good  Speech:  Clear and Coherent and Normal Rate  Volume:  Normal  Mood:  Euthymic  Affect:  Congruent  Thought Process:  Goal Directed and Descriptions of Associations: Intact  Orientation:  Full (Time, Place, and Person)  Thought Content: Logical   Suicidal Thoughts:  No  Homicidal Thoughts:  No   Memory:  WNL  Judgement:  Good  Insight:  Good  Psychomotor Activity:  walks slowly and stooped slightly 'from the back pain'  Concentration:  Concentration: Good and Attention Span: Good  Recall:  Good  Fund of Knowledge: Good  Language: Good  Assets:  Desire for Improvement Financial Resources/Insurance Housing Transportation  ADL's:  Intact  Cognition: WNL  Prognosis:  Good   DIAGNOSES:    ICD-10-CM   1. Bipolar 2 disorder (HCC)  F31.81     2. Insomnia due to other mental disorder  F51.05    F99     3. Chronic pain syndrome  G89.4       Receiving Psychotherapy: No   RECOMMENDATIONS:  PDMP was reviewed. Gabapentin  filled 09/13/2023. I provided approximately  20 minutes of face to face time during this encounter, including time spent before and after the visit in records review, medical decision making, counseling pertinent to today's visit, and charting.   She is doing well on current medications so no changes need to be made.  Continue Wellbutrin  XL 450 mg every morning. Continue Cymbalta  60 mg, 2 p.o. daily.    Continue Seroquel   600 mg p.o. nightly. Continue gabapentin  300 mg, 2 p.o. 3 times daily. (For pain) Return in 4 months.  Verneita Cooks, PA-C

## 2023-10-20 ENCOUNTER — Other Ambulatory Visit: Payer: Self-pay | Admitting: Physician Assistant

## 2023-10-20 NOTE — Telephone Encounter (Signed)
 Last Fill: 06/12/2023  Labs: 06/12/2023 CMP WNL  CBC WNL  Uric acid WNL   Next Visit: 12/12/2023  Last Visit: 03/14/2023  DX: Idiopathic chronic gout of multiple sites without tophus   Current Dose per office note on 03/14/2023: allopurinol  450 mg daily.   Okay to refill Allopurinol ?
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
## 2023-12-12 ENCOUNTER — Ambulatory Visit: Attending: Rheumatology | Admitting: Rheumatology

## 2023-12-12 ENCOUNTER — Encounter: Payer: Self-pay | Admitting: Rheumatology

## 2023-12-12 VITALS — BP 161/90 | HR 71 | Temp 98.1°F | Resp 14 | Ht 65.5 in | Wt 181.4 lb

## 2023-12-12 DIAGNOSIS — M170 Bilateral primary osteoarthritis of knee: Secondary | ICD-10-CM

## 2023-12-12 DIAGNOSIS — M47816 Spondylosis without myelopathy or radiculopathy, lumbar region: Secondary | ICD-10-CM

## 2023-12-12 DIAGNOSIS — M8589 Other specified disorders of bone density and structure, multiple sites: Secondary | ICD-10-CM

## 2023-12-12 DIAGNOSIS — M5030 Other cervical disc degeneration, unspecified cervical region: Secondary | ICD-10-CM

## 2023-12-12 DIAGNOSIS — M25511 Pain in right shoulder: Secondary | ICD-10-CM

## 2023-12-12 DIAGNOSIS — G894 Chronic pain syndrome: Secondary | ICD-10-CM

## 2023-12-12 DIAGNOSIS — F5101 Primary insomnia: Secondary | ICD-10-CM

## 2023-12-12 DIAGNOSIS — Z8679 Personal history of other diseases of the circulatory system: Secondary | ICD-10-CM

## 2023-12-12 DIAGNOSIS — G8929 Other chronic pain: Secondary | ICD-10-CM

## 2023-12-12 DIAGNOSIS — F32A Depression, unspecified: Secondary | ICD-10-CM

## 2023-12-12 DIAGNOSIS — M19041 Primary osteoarthritis, right hand: Secondary | ICD-10-CM | POA: Diagnosis not present

## 2023-12-12 DIAGNOSIS — F419 Anxiety disorder, unspecified: Secondary | ICD-10-CM

## 2023-12-12 DIAGNOSIS — Z8719 Personal history of other diseases of the digestive system: Secondary | ICD-10-CM

## 2023-12-12 DIAGNOSIS — R5383 Other fatigue: Secondary | ICD-10-CM

## 2023-12-12 DIAGNOSIS — M19042 Primary osteoarthritis, left hand: Secondary | ICD-10-CM

## 2023-12-12 DIAGNOSIS — Z5181 Encounter for therapeutic drug level monitoring: Secondary | ICD-10-CM | POA: Diagnosis not present

## 2023-12-12 DIAGNOSIS — M1A09X0 Idiopathic chronic gout, multiple sites, without tophus (tophi): Secondary | ICD-10-CM | POA: Diagnosis not present

## 2023-12-12 DIAGNOSIS — M797 Fibromyalgia: Secondary | ICD-10-CM

## 2023-12-12 NOTE — Patient Instructions (Signed)

## 2023-12-13 ENCOUNTER — Ambulatory Visit: Payer: Self-pay | Admitting: Rheumatology

## 2023-12-13 LAB — CBC WITH DIFFERENTIAL/PLATELET
Absolute Lymphocytes: 1515 {cells}/uL (ref 850–3900)
Absolute Monocytes: 390 {cells}/uL (ref 200–950)
Basophils Absolute: 30 {cells}/uL (ref 0–200)
Basophils Relative: 0.4 %
Eosinophils Absolute: 210 {cells}/uL (ref 15–500)
Eosinophils Relative: 2.8 %
HCT: 36.6 % (ref 35.0–45.0)
Hemoglobin: 11.6 g/dL — ABNORMAL LOW (ref 11.7–15.5)
MCH: 29.9 pg (ref 27.0–33.0)
MCHC: 31.7 g/dL — ABNORMAL LOW (ref 32.0–36.0)
MCV: 94.3 fL (ref 80.0–100.0)
MPV: 9.8 fL (ref 7.5–12.5)
Monocytes Relative: 5.2 %
Neutro Abs: 5355 {cells}/uL (ref 1500–7800)
Neutrophils Relative %: 71.4 %
Platelets: 318 Thousand/uL (ref 140–400)
RBC: 3.88 Million/uL (ref 3.80–5.10)
RDW: 13.9 % (ref 11.0–15.0)
Total Lymphocyte: 20.2 %
WBC: 7.5 Thousand/uL (ref 3.8–10.8)

## 2023-12-13 LAB — COMPREHENSIVE METABOLIC PANEL WITH GFR
AG Ratio: 1.5 (calc) (ref 1.0–2.5)
ALT: 9 U/L (ref 6–29)
AST: 11 U/L (ref 10–35)
Albumin: 4 g/dL (ref 3.6–5.1)
Alkaline phosphatase (APISO): 83 U/L (ref 37–153)
BUN: 12 mg/dL (ref 7–25)
CO2: 31 mmol/L (ref 20–32)
Calcium: 9.8 mg/dL (ref 8.6–10.4)
Chloride: 103 mmol/L (ref 98–110)
Creat: 1 mg/dL (ref 0.60–1.00)
Globulin: 2.6 g/dL (ref 1.9–3.7)
Glucose, Bld: 109 mg/dL — ABNORMAL HIGH (ref 65–99)
Potassium: 4.5 mmol/L (ref 3.5–5.3)
Sodium: 141 mmol/L (ref 135–146)
Total Bilirubin: 0.2 mg/dL (ref 0.2–1.2)
Total Protein: 6.6 g/dL (ref 6.1–8.1)
eGFR: 59 mL/min/1.73m2 — ABNORMAL LOW (ref >=60)

## 2023-12-13 LAB — URIC ACID: Uric Acid, Serum: 2.7 mg/dL (ref 2.5–7.0)

## 2023-12-13 NOTE — Progress Notes (Signed)
 Hemoglobin is low at 10.6.  Patient should take multivitamin with iron.  Glucose is mildly elevated, probably not a fasting sample.  Creatinine is mildly elevated.  Patient should increase water intake.  Uric acid is in the desirable range.

## 2023-12-17 ENCOUNTER — Other Ambulatory Visit (HOSPITAL_BASED_OUTPATIENT_CLINIC_OR_DEPARTMENT_OTHER): Payer: Self-pay | Admitting: Physician Assistant

## 2023-12-17 DIAGNOSIS — Z0000 Encounter for general adult medical examination without abnormal findings: Secondary | ICD-10-CM

## 2024-01-14 ENCOUNTER — Emergency Department (HOSPITAL_COMMUNITY)

## 2024-01-14 ENCOUNTER — Other Ambulatory Visit: Payer: Self-pay

## 2024-01-14 ENCOUNTER — Inpatient Hospital Stay (HOSPITAL_COMMUNITY)
Admission: EM | Admit: 2024-01-14 | Discharge: 2024-01-22 | DRG: 564 | Disposition: A | Discharge status: Skilled Nursing Facility | Attending: Internal Medicine | Admitting: Internal Medicine

## 2024-01-14 DIAGNOSIS — T796XXA Traumatic ischemia of muscle, initial encounter: Principal | ICD-10-CM | POA: Diagnosis present

## 2024-01-14 DIAGNOSIS — K589 Irritable bowel syndrome without diarrhea: Secondary | ICD-10-CM | POA: Diagnosis present

## 2024-01-14 DIAGNOSIS — N179 Acute kidney failure, unspecified: Secondary | ICD-10-CM

## 2024-01-14 DIAGNOSIS — K219 Gastro-esophageal reflux disease without esophagitis: Secondary | ICD-10-CM | POA: Diagnosis present

## 2024-01-14 DIAGNOSIS — N170 Acute kidney failure with tubular necrosis: Secondary | ICD-10-CM | POA: Diagnosis present

## 2024-01-14 DIAGNOSIS — R52 Pain, unspecified: Secondary | ICD-10-CM | POA: Diagnosis not present

## 2024-01-14 DIAGNOSIS — W010XXA Fall on same level from slipping, tripping and stumbling without subsequent striking against object, initial encounter: Secondary | ICD-10-CM | POA: Diagnosis present

## 2024-01-14 DIAGNOSIS — M1990 Unspecified osteoarthritis, unspecified site: Secondary | ICD-10-CM | POA: Diagnosis present

## 2024-01-14 DIAGNOSIS — E119 Type 2 diabetes mellitus without complications: Secondary | ICD-10-CM | POA: Diagnosis present

## 2024-01-14 DIAGNOSIS — K5730 Diverticulosis of large intestine without perforation or abscess without bleeding: Secondary | ICD-10-CM | POA: Diagnosis present

## 2024-01-14 DIAGNOSIS — Z886 Allergy status to analgesic agent status: Secondary | ICD-10-CM

## 2024-01-14 DIAGNOSIS — M797 Fibromyalgia: Secondary | ICD-10-CM | POA: Diagnosis present

## 2024-01-14 DIAGNOSIS — E66811 Obesity, class 1: Secondary | ICD-10-CM | POA: Diagnosis present

## 2024-01-14 DIAGNOSIS — F411 Generalized anxiety disorder: Secondary | ICD-10-CM | POA: Diagnosis present

## 2024-01-14 DIAGNOSIS — Z83511 Family history of glaucoma: Secondary | ICD-10-CM

## 2024-01-14 DIAGNOSIS — I700 Atherosclerosis of aorta: Secondary | ICD-10-CM | POA: Diagnosis present

## 2024-01-14 DIAGNOSIS — E7800 Pure hypercholesterolemia, unspecified: Secondary | ICD-10-CM | POA: Diagnosis present

## 2024-01-14 DIAGNOSIS — I10 Essential (primary) hypertension: Secondary | ICD-10-CM | POA: Diagnosis present

## 2024-01-14 DIAGNOSIS — E871 Hypo-osmolality and hyponatremia: Secondary | ICD-10-CM | POA: Diagnosis present

## 2024-01-14 DIAGNOSIS — F3181 Bipolar II disorder: Secondary | ICD-10-CM | POA: Diagnosis present

## 2024-01-14 DIAGNOSIS — E876 Hypokalemia: Secondary | ICD-10-CM | POA: Diagnosis present

## 2024-01-14 DIAGNOSIS — Y92019 Unspecified place in single-family (private) house as the place of occurrence of the external cause: Secondary | ICD-10-CM | POA: Diagnosis not present

## 2024-01-14 DIAGNOSIS — Z8249 Family history of ischemic heart disease and other diseases of the circulatory system: Secondary | ICD-10-CM

## 2024-01-14 DIAGNOSIS — M109 Gout, unspecified: Secondary | ICD-10-CM | POA: Diagnosis present

## 2024-01-14 DIAGNOSIS — R0902 Hypoxemia: Secondary | ICD-10-CM | POA: Diagnosis not present

## 2024-01-14 DIAGNOSIS — R7401 Elevation of levels of liver transaminase levels: Secondary | ICD-10-CM

## 2024-01-14 DIAGNOSIS — R55 Syncope and collapse: Secondary | ICD-10-CM | POA: Diagnosis not present

## 2024-01-14 DIAGNOSIS — S79912A Unspecified injury of left hip, initial encounter: Secondary | ICD-10-CM | POA: Diagnosis present

## 2024-01-14 DIAGNOSIS — E872 Acidosis, unspecified: Secondary | ICD-10-CM | POA: Diagnosis present

## 2024-01-14 DIAGNOSIS — D649 Anemia, unspecified: Secondary | ICD-10-CM | POA: Diagnosis present

## 2024-01-14 DIAGNOSIS — G894 Chronic pain syndrome: Secondary | ICD-10-CM | POA: Diagnosis present

## 2024-01-14 DIAGNOSIS — Z8042 Family history of malignant neoplasm of prostate: Secondary | ICD-10-CM

## 2024-01-14 DIAGNOSIS — Z9049 Acquired absence of other specified parts of digestive tract: Secondary | ICD-10-CM

## 2024-01-14 DIAGNOSIS — K5900 Constipation, unspecified: Secondary | ICD-10-CM | POA: Diagnosis not present

## 2024-01-14 DIAGNOSIS — E785 Hyperlipidemia, unspecified: Secondary | ICD-10-CM | POA: Diagnosis present

## 2024-01-14 DIAGNOSIS — Z6832 Body mass index (BMI) 32.0-32.9, adult: Secondary | ICD-10-CM | POA: Diagnosis not present

## 2024-01-14 DIAGNOSIS — M6282 Rhabdomyolysis: Principal | ICD-10-CM

## 2024-01-14 DIAGNOSIS — Z751 Person awaiting admission to adequate facility elsewhere: Secondary | ICD-10-CM

## 2024-01-14 DIAGNOSIS — Z888 Allergy status to other drugs, medicaments and biological substances status: Secondary | ICD-10-CM

## 2024-01-14 DIAGNOSIS — Z79899 Other long term (current) drug therapy: Secondary | ICD-10-CM

## 2024-01-14 DIAGNOSIS — M7989 Other specified soft tissue disorders: Secondary | ICD-10-CM | POA: Diagnosis not present

## 2024-01-14 LAB — CBC WITH DIFFERENTIAL/PLATELET
Abs Immature Granulocytes: 0.04 K/uL (ref 0.00–0.07)
Basophils Absolute: 0 K/uL (ref 0.0–0.1)
Basophils Relative: 0 %
Eosinophils Absolute: 0 K/uL (ref 0.0–0.5)
Eosinophils Relative: 0 %
HCT: 40.5 % (ref 36.0–46.0)
Hemoglobin: 13.1 g/dL (ref 12.0–15.0)
Immature Granulocytes: 1 %
Lymphocytes Relative: 13 %
Lymphs Abs: 1.2 K/uL (ref 0.7–4.0)
MCH: 30.2 pg (ref 26.0–34.0)
MCHC: 32.3 g/dL (ref 30.0–36.0)
MCV: 93.3 fL (ref 80.0–100.0)
Monocytes Absolute: 0.6 K/uL (ref 0.1–1.0)
Monocytes Relative: 7 %
Neutro Abs: 7 K/uL (ref 1.7–7.7)
Neutrophils Relative %: 79 %
Platelets: 251 K/uL (ref 150–400)
RBC: 4.34 MIL/uL (ref 3.87–5.11)
RDW: 14.8 % (ref 11.5–15.5)
WBC: 8.9 K/uL (ref 4.0–10.5)
nRBC: 0 % (ref 0.0–0.2)

## 2024-01-14 LAB — CK: Total CK: >20000 U/L — ABNORMAL HIGH (ref 38–234)

## 2024-01-14 LAB — COMPREHENSIVE METABOLIC PANEL WITH GFR
ALT: 171 U/L — ABNORMAL HIGH (ref 0–44)
AST: 829 U/L — ABNORMAL HIGH (ref 15–41)
Albumin: 3.8 g/dL (ref 3.5–5.0)
Alkaline Phosphatase: 89 U/L (ref 38–126)
Anion gap: 13 (ref 5–15)
BUN: 22 mg/dL (ref 8–23)
CO2: 25 mmol/L (ref 22–32)
Calcium: 9.1 mg/dL (ref 8.9–10.3)
Chloride: 100 mmol/L (ref 98–111)
Creatinine, Ser: 2.44 mg/dL — ABNORMAL HIGH (ref 0.44–1.00)
GFR, Estimated: 20 mL/min — ABNORMAL LOW (ref >60)
Glucose, Bld: 142 mg/dL — ABNORMAL HIGH (ref 70–99)
Potassium: 3.7 mmol/L (ref 3.5–5.1)
Sodium: 138 mmol/L (ref 135–145)
Total Bilirubin: 0.4 mg/dL (ref 0.0–1.2)
Total Protein: 6.8 g/dL (ref 6.5–8.1)

## 2024-01-14 MED ORDER — ACETAMINOPHEN 325 MG PO TABS
650.0000 mg | ORAL_TABLET | Freq: Once | ORAL | Status: AC
  Administered 2024-01-14: 650 mg via ORAL
  Filled 2024-01-14: qty 2

## 2024-01-14 MED ORDER — ACETAMINOPHEN 650 MG RE SUPP
650.0000 mg | Freq: Four times a day (QID) | RECTAL | Status: DC | PRN
Start: 2024-01-14 — End: 2024-01-22

## 2024-01-14 MED ORDER — SENNOSIDES-DOCUSATE SODIUM 8.6-50 MG PO TABS
1.0000 | ORAL_TABLET | Freq: Every evening | ORAL | Status: DC | PRN
Start: 2024-01-14 — End: 2024-01-17
  Administered 2024-01-15: 1 via ORAL
  Filled 2024-01-14: qty 1

## 2024-01-14 MED ORDER — SODIUM CHLORIDE 0.9% FLUSH
3.0000 mL | Freq: Two times a day (BID) | INTRAVENOUS | Status: DC
  Administered 2024-01-14 – 2024-01-22 (×13): 3 mL via INTRAVENOUS

## 2024-01-14 MED ORDER — LACTATED RINGERS IV BOLUS
2000.0000 mL | Freq: Once | INTRAVENOUS | Status: AC
  Administered 2024-01-14: 2000 mL via INTRAVENOUS

## 2024-01-14 MED ORDER — HEPARIN SODIUM (PORCINE) 5000 UNIT/ML IJ SOLN
5000.0000 [IU] | Freq: Three times a day (TID) | INTRAMUSCULAR | Status: DC
  Administered 2024-01-15 – 2024-01-22 (×23): 5000 [IU] via SUBCUTANEOUS
  Filled 2024-01-14 (×23): qty 1

## 2024-01-14 MED ORDER — BACITRACIN ZINC 500 UNIT/GM EX OINT
TOPICAL_OINTMENT | Freq: Once | CUTANEOUS | Status: AC
  Administered 2024-01-14: 2 via TOPICAL
  Filled 2024-01-14: qty 1.8

## 2024-01-14 MED ORDER — FENTANYL CITRATE (PF) 50 MCG/ML IJ SOSY
50.0000 ug | PREFILLED_SYRINGE | Freq: Once | INTRAMUSCULAR | Status: AC
  Administered 2024-01-14: 50 ug via INTRAVENOUS
  Filled 2024-01-14: qty 1

## 2024-01-14 MED ORDER — ACETAMINOPHEN 325 MG PO TABS
650.0000 mg | ORAL_TABLET | Freq: Four times a day (QID) | ORAL | Status: DC | PRN
  Administered 2024-01-15 – 2024-01-22 (×8): 650 mg via ORAL
  Filled 2024-01-14 (×8): qty 2

## 2024-01-14 MED ORDER — LACTATED RINGERS IV SOLN: INTRAVENOUS | Status: DC

## 2024-01-14 NOTE — ED Triage Notes (Signed)
 Patient to ED by POV with c/o left hip and leg pain due to a fall that occurred last night. She does not have any swelling or deformity to area. She is able to move it but pain increases when she bear weight.

## 2024-01-14 NOTE — ED Provider Notes (Signed)
 [place] [facility] DEPARTMENT AT [facility] Provider Note   CSN: [id number] Arrival date & time: 01/14/24  1735     Patient presents with: [place]   Tammy Mullins is a 73 y.o. female.   Patient is a 73 year old female with past medical history of hypertension, diabetes, bipolar disorder, GERD presenting to the emergency department after a fall.  Patient states that she was feeding her pads last night and spilled water on the floor and accidentally slipped on the water and fell.  She states that she passed out and did not wake up until this morning.  She states that she was able to scoot herself onto the couch when she was able to call her niece who was able to help her up.  She states that she initially felt sore but as the day went on she had significant pain in her left hip and is having difficulty walking due to the pain.  She states that she also noticed blistering to her left leg.  She states that she does not believe that she fell on a radiator or anything hot.  She states that prior to falling she was not feeling lightheaded or dizzy.  She states that she does feel little nauseous today.  Denies any headache.  Denies any numbness or weakness.  The history is provided by the patient.  Fall       Prior to Admission medications   Medication Sig Start Date End Date Taking? Authorizing Provider  allopurinol  (ZYLOPRIM ) 300 MG tablet TAKE 1 AND 1/2 TABLETS(450 MG) BY MOUTH DAILY 10/22/23   Dolphus Reiter, MD  amLODipine  (NORVASC ) 10 MG tablet TAKE 1 TABLET DAILY 10/21/13   Daryl Setter, NP  Blood Glucose Monitoring Suppl (ONETOUCH VERIO FLEX SYSTEM) w/Device KIT Use as directed    [provider]  buPROPion  (WELLBUTRIN  XL) 150 MG 24 hr tablet Take 3 tablets (450 mg total) by mouth daily. 10/05/23   Rhys Boyer T, PA-C  DULoxetine  (CYMBALTA ) 60 MG capsule Take 2 capsules (120 mg total) by mouth daily. 10/05/23   Rhys Boyer DASEN, PA-C  famotidine (PEPCID) 40  MG tablet Take 40 mg by mouth daily.    [provider]  ferrous sulfate  325 (65 FE) MG tablet Take 325 mg by mouth daily with breakfast.    [provider]  gabapentin  (NEURONTIN ) 600 MG tablet Take 600 mg by mouth every 8 (eight) hours. 02/27/23   [provider]  glucose blood (ONETOUCH VERIO) test strip Use as instructed    [provider]  hydrochlorothiazide  (HYDRODIURIL ) 50 MG tablet Take 50 mg by mouth daily.    [provider]  hyoscyamine  (LEVSIN  SL) 0.125 MG SL tablet SMARTSIG:1-2 Tablet(s) Sublingual Every 4-6 Hours PRN 03/29/22   [provider]  Lancets (ONETOUCH ULTRASOFT) lancets Use as instructed    [provider]  losartan  (COZAAR ) 100 MG tablet 1 tablet by mouth every morning    [provider]  metoprolol  (TOPROL -XL) 200 MG 24 hr tablet Take 200 mg by mouth daily. 04/11/22   [provider]  omeprazole  (PRILOSEC) 40 MG capsule Take 1 capsule (40 mg total) by mouth in the morning and at bedtime. 03/09/23   May, Deanna J, NP  QUEtiapine  (SEROQUEL ) 300 MG tablet Take 2 tablets (600 mg total) by mouth at bedtime. 10/05/23   Rhys Boyer T, PA-C  zonisamide  (ZONEGRAN ) 100 MG capsule TAKE 1 CAPSULE(100 MG) BY MOUTH AT BEDTIME 06/06/23   Gayland Lauraine PARAS, NP  Allergies: Crestor  [rosuvastatin  calcium ], Dicyclomine , Topiramate , Asa [aspirin ], and Celebrex [celecoxib]    Review of Systems  Updated Vital Signs BP 125/70   Pulse 84   Temp 98 F (36.7 C) (Oral)   Resp 13   Ht 5' 5.5 (1.664 m)   Wt 78.5 kg   SpO2 92%   BMI 28.35 kg/m   Physical Exam Vitals and nursing note reviewed.  Constitutional:      General: She is not in acute distress.    Appearance: Normal appearance.  HENT:     Head: Normocephalic and atraumatic.     Nose: Nose normal.     Mouth/Throat:     Mouth: Mucous membranes are moist.     Pharynx: Oropharynx is clear.  Eyes:     Extraocular Movements: Extraocular movements  intact.     Conjunctiva/sclera: Conjunctivae normal.  Neck:     Comments: No midline neck tenderness Cardiovascular:     Rate and Rhythm: Normal rate and regular rhythm.     Heart sounds: Normal heart sounds.  Pulmonary:     Effort: Pulmonary effort is normal.     Breath sounds: Normal breath sounds.  Abdominal:     General: Abdomen is flat.     Palpations: Abdomen is soft.     Tenderness: There is no abdominal tenderness.  Musculoskeletal:     Cervical back: Normal range of motion and neck supple.     Comments: No midline back tenderness, no bony tenderness to bilateral upper extremities or right lower extremities, tenderness to palpation of the left hip with increased pain with hip ROM Compartments of L thigh soft  Skin:    General: Skin is warm and dry.     Comments: Skin blistering to left posterior calf, appearing like a second-degree burn, approximately 1% TBSA  Neurological:     General: No focal deficit present.     Mental Status: She is alert and oriented to person, place, and time.     Sensory: No sensory deficit.     Motor: No weakness.  Psychiatric:        Mood and Affect: Mood normal.        Behavior: Behavior normal.     (all labs ordered are listed, but only abnormal results are displayed) Labs Reviewed  COMPREHENSIVE METABOLIC PANEL WITH GFR - Abnormal; Notable for the following components:      Result Value   Glucose, Bld 142 (*)    Creatinine, Ser 2.44 (*)    AST 829 (*)    ALT 171 (*)    GFR, Estimated 20 (*)    All other components within normal limits  CK - Abnormal; Notable for the following components:   Total CK >20,000 (*)    All other components within normal limits  CBC WITH DIFFERENTIAL/PLATELET    EKG: EKG Interpretation Date/Time:  Sunday January 14 2024 20:51:03 EST Ventricular Rate:  89 PR Interval:  181 QRS Duration:  81 QT Interval:  396 QTC Calculation: 482 R Axis:   -39  Text Interpretation: Sinus rhythm Left axis  deviation Abnormal R-wave progression, late transition No significant change since last tracing Confirmed by Ellouise Fine ([id number]) on 01/14/2024 9:19:16 PM  Radiology: ARCOLA Knee Left Port Result Date: 01/14/2024 EXAM: 4 VIEW(S) XRAY OF THE LEFT KNEE 01/14/2024 07:10:00 PM COMPARISON: None available. CLINICAL HISTORY: 809823 Fall 190176 FINDINGS: BONES AND JOINTS: No acute fracture. No focal osseous lesion. No joint dislocation. No significant joint effusion. No significant degenerative changes.  SOFT TISSUES: The soft tissues are unremarkable. IMPRESSION: 1. No acute fracture or dislocation. Electronically signed by: Lonni Necessary MD 01/14/2024 07:35 PM EST RP Workstation: [id number]   DG Tibia/Fibula Left Port Result Date: 01/14/2024 EXAM: 2 VIEWS XRAY OF THE LEFT TIBIA AND FIBULA 01/14/2024 07:10:00 PM COMPARISON: None available. CLINICAL HISTORY: 809823 Fall 190176 [id number] Fall 809823 FINDINGS: BONES AND JOINTS: No acute fracture. No focal osseous lesion. No joint dislocation. SOFT TISSUES: The soft tissues are unremarkable. IMPRESSION: 1. No significant abnormality. Electronically signed by: Lonni Necessary MD 01/14/2024 07:34 PM EST RP Workstation: [id number]   DG Hip Unilat With Pelvis 2-3 Views Left Result Date: 01/14/2024 EXAM: 3 VIEW(S) XRAY OF THE LEFT HIP 01/14/2024 07:10:00 PM COMPARISON: None available. CLINICAL HISTORY: Fall. Left lower extremity pain. FINDINGS: BONES AND JOINTS: No acute fracture or focal osseous lesion. The hip joint is maintained. No significant degenerative changes. LUMBAR SPINE: Lower lumbar fusion extends to the S1 level. SOFT TISSUES: The soft tissues are unremarkable. IMPRESSION: 1. No acute fracture or dislocation of the left hip or visualized pelvis. Electronically signed by: Lonni Necessary MD 01/14/2024 07:34 PM EST RP Workstation: [id number]   CT Cervical Spine Wo Contrast Result Date: 01/14/2024 EXAM: CT CERVICAL SPINE WITHOUT CONTRAST  01/14/2024 06:50:17 PM TECHNIQUE: CT of the cervical spine was performed without the administration of intravenous contrast. Multiplanar reformatted images are provided for review. Automated exposure control, iterative reconstruction, and/or weight based adjustment of the mA/kV was utilized to reduce the radiation dose to as low as reasonably achievable. COMPARISON: CT of the cervical spine 10/13/2017. CLINICAL HISTORY: Neck trauma (Age >= 65y). Patient reports a fall last night with left lower extremity pain. FINDINGS: CERVICAL SPINE: BONES AND ALIGNMENT: No acute fracture or traumatic malalignment. Straightening of the normal cervical lordosis is similar to the prior study. DEGENERATIVE CHANGES: Solid osteophyte formation is present at C3-C4. Uncovertebral and facet spurring result in progressive moderate foraminal narrowing bilaterally at C6-C7. SOFT TISSUES: No prevertebral soft tissue swelling. IMPRESSION: 1. No acute abnormality of the cervical spine related to the reported fall. 2. Progressive moderate foraminal narrowing bilaterally at C6-7 due to uncovertebral and facet spurring. Electronically signed by: Lonni Necessary MD 01/14/2024 06:57 PM EST RP Workstation: [id number]   CT Head Wo Contrast Result Date: 01/14/2024 EXAM: CT HEAD WITHOUT CONTRAST 01/14/2024 06:50:17 PM TECHNIQUE: CT of the head was performed without the administration of intravenous contrast. Automated exposure control, iterative reconstruction, and/or weight based adjustment of the mA/kV was utilized to reduce the radiation dose to as low as reasonably achievable. COMPARISON: None available. CLINICAL HISTORY: Head trauma, minor (Age >= 65y). Patient reports fall last evening with left lower extremity pain. FINDINGS: BRAIN AND VENTRICLES: No acute hemorrhage. No evidence of acute infarct. No hydrocephalus. No extra-axial collection. No mass effect or midline shift. Scattered areas of ossification are present along the dura  including the tentorium. Atherosclerotic calcifications are present in the cavernous carotid arteries bilaterally. No hyperdense vessel is present. ORBITS: No acute abnormality. SINUSES: No acute abnormality. SOFT TISSUES AND SKULL: No acute soft tissue abnormality. No skull fracture. IMPRESSION: 1. No acute intracranial abnormality related to the reported head trauma. Electronically signed by: Lonni Necessary MD 01/14/2024 06:55 PM EST RP Workstation: [id number]     Procedures   Medications Ordered in the ED  lactated ringers  bolus 2,000 mL (has no administration in time range)  fentaNYL  (SUBLIMAZE ) injection 50 mcg (has no administration in time range)  bacitracin  ointment (2 Applications Topical Given 01/14/24 1837)  acetaminophen  (TYLENOL ) tablet 650 mg (650 mg Oral Given 01/14/24 1838)    Clinical Course as of 01/14/24 2139  Sun Jan 14, 2024  1913 No traumatic injury on CT imaging. [VK]  1938 No bony injury on XR. Labs pending. [VK]  2114 Work up with significant CK elevation consistent with rhabdo with evidence of hepatorenal injury. Will be started on IVF and will need admission. [VK]    Clinical Course User Index [VK] Kingsley, Virdell Hoiland K, DO                                 Medical Decision Making This patient presents to the ED with chief complaint(s) of syncope, fall with pertinent past medical history of HTN, DM, GERD, bipolar disorder which further complicates the presenting complaint. The complaint involves an extensive differential diagnosis and also carries with it a high risk of complications and morbidity.    The differential diagnosis includes arrhythmia, anemia, dehydration, electrolyte abnormality, rhabdo, burn, ICH, mass effect, cervical spine injury, hip fracture, pelvis fracture, muscle strain or spasm  Additional history obtained: Additional history obtained from N/A Records reviewed Primary Care Documents and outpatient rheumatology records  ED Course and  Reassessment: On patient's arrival she is hemodynamically stable in no acute distress.  Had x-rays ordered from triage.  The patient will need labs, EKG and imaging to evaluate for traumatic injury or etiology of her syncope.  She will need to be undressed and evaluate for any further wounds from her prolonged downtime.  She will be closely reassessed.  Independent labs interpretation:  The following labs were independently interpreted: significantly elevated CK consistent with rhabdo with AKI and transaminitis  Independent visualization of imaging: - I independently visualized the following imaging with scope of interpretation limited to determining acute life threatening conditions related to emergency care: CTH/C-spine, L hip/knee/tib/fib XR, which revealed no acute traumatic injury  Consultation: - Consulted or discussed management/test interpretation w/ external professional: hospitalist  Consideration for admission or further workup: patient requires admission for rhabdo Social Determinants of health: N/A    Amount and/or Complexity of Data Reviewed Labs: ordered. Radiology: ordered.  Risk OTC drugs. Prescription drug management. Decision regarding hospitalization.       Final diagnoses:  Non-traumatic rhabdomyolysis  AKI (acute kidney injury)  Transaminitis    ED Discharge Orders     None          Kingsley, Kearra Calkin K, DO 01/14/24 2139

## 2024-01-14 NOTE — H&P (Incomplete)
 History and Physical    Tammy Mullins FMW:[id number] DOB: 1951-02-08 DOA: 01/14/2024  DOS: the patient was seen and examined on 01/14/2024  PCP: Wonda Worth SQUIBB, PA   Patient coming from: Home  I have personally briefly reviewed patient's old medical records in [facility] and CareEverywhere  HPI:   Tammy Mullins is a 73 y.o. year old female with medical history of hypertension, hyperlipidemia, bipolar disorder, GAD presenting to the ED after a fall.  She   Pt states   On arrival to the ED patient was noted to be HDS stable.  Lab work and imaging obtained.  CBC obtained that was unremarkable.  CMP notable for elevated AST, ALT and creatinine at 2.4.  CK greater than 20,000.  CT head and CT cervical spine without any acute findings.  It does show some progressive moderate foraminal narrowing bilaterally at C6/7 which appears to be a chronic finding.  X-ray of left knee and left tib-fib without any acute findings.  Given elevated CK level, TR[facility] contacted for admission.  Review of Systems: As mentioned in the history of present illness. All other systems reviewed and are negative.   Past Medical History:  Diagnosis Date   Allergic rhinitis, cause unspecified    Allergy    Anxiety state, unspecified    Aortic atherosclerosis 07/11/2022   Arthritis    Attention deficit disorder without mention of hyperactivity    Barrett's esophagus    Blood transfusion without reported diagnosis 2018-10   Chronic pain syndrome    Colon ulcer - IC valve 08/10/2016   Depression    Diabetes type 2, controlled (HCC) 01/09/2014   Diverticulosis of colon (without mention of hemorrhage)    Esophageal reflux    Fibromyalgia    Gout, unspecified    Headache    Hiatal hernia    Irritable bowel syndrome    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    Myalgia and myositis, unspecified    Osteoarthrosis, unspecified whether generalized or localized, unspecified site    Personal history  of unspecified circulatory disease    Pure hypercholesterolemia    Radial styloid tenosynovitis    Unspecified essential hypertension    Unspecified nonpsychotic mental disorder    Unspecified pruritic disorder     Past Surgical History:  Procedure Laterality Date   ANKLE SURGERY Right    APPENDECTOMY     BACK SURGERY  03/19/2021   CARPAL TUNNEL RELEASE Right    CERVICAL SPINE SURGERY     plates and pins   CHOLECYSTECTOMY N/A 06/26/2015   Procedure: LAPAROSCOPIC CHOLECYSTECTOMY;  Surgeon: Lynda Leos, MD;  Location: [facility] OR;  Service: General;  Laterality: N/A;   COLONOSCOPY     COLONOSCOPY  2019   ESOPHAGOGASTRODUODENOSCOPY     ESOPHAGOGASTRODUODENOSCOPY N/A 12/04/2016   Procedure: ESOPHAGOGASTRODUODENOSCOPY (EGD);  Surgeon: Aneita Gwendlyn DASEN, MD;  Location: [facility] ENDOSCOPY;  Service: Endoscopy;  Laterality: N/A;   LAPAROSCOPIC APPENDECTOMY N/A 06/26/2015   Procedure: LAPAROSCOPIC APPENDECTOMY;  Surgeon: Lynda Leos, MD;  Location: [facility] OR;  Service: General;  Laterality: N/A;   ROTATOR CUFF REPAIR Right    SALPINGOOPHORECTOMY  Left   ectopic 1980   TOTAL SHOULDER ARTHROPLASTY     TRANSFORAMINAL LUMBAR INTERBODY FUSION (TLIF) WITH PEDICLE SCREW FIXATION 2 LEVEL N/A 01/28/2019   Procedure: TRANSFORAMINAL LUMBAR INTERBODY FUSION LUMBAR THREE-FOUR, LUMBAR FOUR-FIVE;  Surgeon: Joshua Alm RAMAN, MD;  Location: [facility] OR;  Service: Neurosurgery;  Laterality: N/A;  posterior   UPPER GASTROINTESTINAL ENDOSCOPY  Allergies  Allergen Reactions   Crestor  [Rosuvastatin  Calcium ] Other (See Comments)    Causes severe body aches   Dicyclomine  Other (See Comments)    Body and face rash, swelling.   Topiramate  Diarrhea   Dorethia Bouillon ] Other (See Comments)    GI upset in hight doses can take a 81mg    Celebrex [Celecoxib] Swelling and Rash    Swelling in legs    Family History  Problem Relation Age of Onset   Lung disease Mother        ?    Glaucoma Mother    Cirrhosis Father     Hypertension Sister    Heart disease Sister    Heart attack Sister    Hypertension Brother    Glaucoma Brother    Prostate cancer Brother    Hypertension Brother    Cancer Maternal Grandmother        ?   Kidney disease Neg Hx    Diabetes Neg Hx    Colon cancer Neg Hx    Colon polyps Neg Hx    Rectal cancer Neg Hx    Stomach cancer Neg Hx     Prior to Admission medications   Medication Sig Start Date End Date Taking? Authorizing Provider  diphenoxylate-atropine (LOMOTIL) 2.5-0.025 MG tablet 1 -2 tablets four times a day for 2 days and then as needed Orally Four times a day 01/11/24  Yes [provider]  allopurinol  (ZYLOPRIM ) 300 MG tablet TAKE 1 AND 1/2 TABLETS(450 MG) BY MOUTH DAILY 10/22/23   Dolphus Reiter, MD  amLODipine  (NORVASC ) 10 MG tablet TAKE 1 TABLET DAILY 10/21/13   Daryl Setter, NP  Blood Glucose Monitoring Suppl (ONETOUCH VERIO FLEX SYSTEM) w/Device KIT Use as directed    [provider]  buPROPion  (WELLBUTRIN  XL) 150 MG 24 hr tablet Take 3 tablets (450 mg total) by mouth daily. 10/05/23   Rhys Boyer T, PA-C  DULoxetine  (CYMBALTA ) 60 MG capsule Take 2 capsules (120 mg total) by mouth daily. 10/05/23   Rhys Boyer DASEN, PA-C  famotidine (PEPCID) 40 MG tablet Take 40 mg by mouth daily.    [provider]  ferrous sulfate  325 (65 FE) MG tablet Take 325 mg by mouth daily with breakfast.    [provider]  gabapentin  (NEURONTIN ) 600 MG tablet Take 600 mg by mouth every 8 (eight) hours. 02/27/23   [provider]  glucose blood (ONETOUCH VERIO) test strip Use as instructed    [provider]  hydrochlorothiazide  (HYDRODIURIL ) 50 MG tablet Take 50 mg by mouth daily.    [provider]  hyoscyamine  (LEVSIN  SL) 0.125 MG SL tablet SMARTSIG:1-2 Tablet(s) Sublingual Every 4-6 Hours PRN 03/29/22   [provider]  Lancets (ONETOUCH ULTRASOFT) lancets Use as instructed    [provider]  losartan   (COZAAR ) 100 MG tablet 1 tablet by mouth every morning    [provider]  methocarbamol  (ROBAXIN ) 500 MG tablet 1 tablet by mouth 3 times a day; Duration: 30 days    [provider]  metoprolol  (TOPROL -XL) 200 MG 24 hr tablet Take 200 mg by mouth daily. 04/11/22   [provider]  omeprazole  (PRILOSEC) 40 MG capsule Take 1 capsule (40 mg total) by mouth in the morning and at bedtime. 03/09/23   May, Deanna J, NP  QUEtiapine  (SEROQUEL ) 300 MG tablet Take 2 tablets (600 mg total) by mouth at bedtime. 10/05/23   Rhys Boyer T, PA-C  zonisamide  (ZONEGRAN ) 100 MG capsule TAKE 1  CAPSULE(100 MG) BY MOUTH AT BEDTIME 06/06/23   Gayland Lauraine PARAS, NP    Social History:  reports that she has never smoked. She has never been exposed to tobacco smoke. She has never used smokeless tobacco. She reports that she does not drink alcohol and does not use drugs. Lives with Tobacco- *** ppd x **** years since age/ Denies use. EtOH- *** shots/ week, *** beers/week. Last drink was ***. Denies use.  Illicit drug use- denies use.  IADLs/ADLs- can perform independently at baseline    Physical Exam: Vitals:   01/14/24 2000 01/14/24 2030 01/14/24 2100 01/14/24 2219  BP: 137/74 131/68 125/70   Pulse: 87 85 84   Resp: 12 13 13    Temp:    (!) 97.5 F (36.4 C)  TempSrc:    Oral  SpO2: 98% 96% 92%   Weight:      Height:         Gen: HENT: CV: Resp: Abd: MSK: Skin: Neuro: Psych:   Labs on Admission: I have personally reviewed following labs and imaging studies  CBC: Recent Labs  Lab 01/14/24 2006  WBC 8.9  NEUTROABS 7.0  HGB 13.1  HCT 40.5  MCV 93.3  PLT 251   Basic Metabolic Panel: Recent Labs  Lab 01/14/24 2006  NA 138  K 3.7  CL 100  CO2 25  GLUCOSE 142*  BUN 22  CREATININE 2.44*  CALCIUM  9.1   GFR: Estimated Creatinine Clearance: 20.9 mL/min (A) (by C-G formula based on SCr of 2.44 mg/dL (H)). Liver Function Tests: Recent Labs  Lab 01/14/24 2006  AST  829*  ALT 171*  ALKPHOS 89  BILITOT 0.4  PROT 6.8  ALBUMIN 3.8   No results for input(s): LIPASE, AMYLASE in the last 168 hours. No results for input(s): AMMONIA in the last 168 hours. Coagulation Profile: No results for input(s): INR, PROTIME in the last 168 hours. Cardiac Enzymes: Recent Labs  Lab 01/14/24 2006  CKTOTAL >20,000*   BNP (last 3 results) No results for input(s): BNP in the last 8760 hours. HbA1C: No results for input(s): HGBA1C in the last 72 hours. CBG: No results for input(s): GLUCAP in the last 168 hours. Lipid Profile: No results for input(s): CHOL, HDL, LDLCALC, TRIG, CHOLHDL, LDLDIRECT in the last 72 hours. Thyroid  Function Tests: No results for input(s): TSH, T4TOTAL, FREET4, T3FREE, THYROIDAB in the last 72 hours. Anemia Panel: No results for input(s): VITAMINB12, FOLATE, FERRITIN, TIBC, IRON, RETICCTPCT in the last 72 hours. Urine analysis:    Component Value Date/Time   COLORURINE STRAW (A) 12/03/2016 0538   APPEARANCEUR CLEAR 12/03/2016 0538   LABSPEC 1.008 12/03/2016 0538   PHURINE 7.0 12/03/2016 0538   GLUCOSEU NEGATIVE 12/03/2016 0538   HGBUR NEGATIVE 12/03/2016 0538   BILIRUBINUR NEGATIVE 12/03/2016 0538   KETONESUR NEGATIVE 12/03/2016 0538   PROTEINUR NEGATIVE 12/03/2016 0538   NITRITE NEGATIVE 12/03/2016 0538   LEUKOCYTESUR NEGATIVE 12/03/2016 0538    Radiological Exams on Admission: I have personally reviewed images DG Knee Left Port Result Date: 01/14/2024 EXAM: 4 VIEW(S) XRAY OF THE LEFT KNEE 01/14/2024 07:10:00 PM COMPARISON: None available. CLINICAL HISTORY: 809823 Fall 190176 FINDINGS: BONES AND JOINTS: No acute fracture. No focal osseous lesion. No joint dislocation. No significant joint effusion. No significant degenerative changes. SOFT TISSUES: The soft tissues are unremarkable. IMPRESSION: 1. No acute fracture or dislocation. Electronically signed by: Lonni Necessary MD  01/14/2024 07:35 PM EST RP Workstation: [id number]   DG Tibia/Fibula Left Port Result Date: 01/14/2024  EXAM: 2 VIEWS XRAY OF THE LEFT TIBIA AND FIBULA 01/14/2024 07:10:00 PM COMPARISON: None available. CLINICAL HISTORY: 809823 Fall 190176 809823 Fall 809823 FINDINGS: BONES AND JOINTS: No acute fracture. No focal osseous lesion. No joint dislocation. SOFT TISSUES: The soft tissues are unremarkable. IMPRESSION: 1. No significant abnormality. Electronically signed by: Lonni Necessary MD 01/14/2024 07:34 PM EST RP Workstation: [id number]   DG Hip Unilat With Pelvis 2-3 Views Left Result Date: 01/14/2024 EXAM: 3 VIEW(S) XRAY OF THE LEFT HIP 01/14/2024 07:10:00 PM COMPARISON: None available. CLINICAL HISTORY: Fall. Left lower extremity pain. FINDINGS: BONES AND JOINTS: No acute fracture or focal osseous lesion. The hip joint is maintained. No significant degenerative changes. LUMBAR SPINE: Lower lumbar fusion extends to the S1 level. SOFT TISSUES: The soft tissues are unremarkable. IMPRESSION: 1. No acute fracture or dislocation of the left hip or visualized pelvis. Electronically signed by: Lonni Necessary MD 01/14/2024 07:34 PM EST RP Workstation: [id number]   CT Cervical Spine Wo Contrast Result Date: 01/14/2024 EXAM: CT CERVICAL SPINE WITHOUT CONTRAST 01/14/2024 06:50:17 PM TECHNIQUE: CT of the cervical spine was performed without the administration of intravenous contrast. Multiplanar reformatted images are provided for review. Automated exposure control, iterative reconstruction, and/or weight based adjustment of the mA/kV was utilized to reduce the radiation dose to as low as reasonably achievable. COMPARISON: CT of the cervical spine 10/13/2017. CLINICAL HISTORY: Neck trauma (Age >= 65y). Patient reports a fall last night with left lower extremity pain. FINDINGS: CERVICAL SPINE: BONES AND ALIGNMENT: No acute fracture or traumatic malalignment. Straightening of the normal cervical lordosis is  similar to the prior study. DEGENERATIVE CHANGES: Solid osteophyte formation is present at C3-C4. Uncovertebral and facet spurring result in progressive moderate foraminal narrowing bilaterally at C6-C7. SOFT TISSUES: No prevertebral soft tissue swelling. IMPRESSION: 1. No acute abnormality of the cervical spine related to the reported fall. 2. Progressive moderate foraminal narrowing bilaterally at C6-7 due to uncovertebral and facet spurring. Electronically signed by: Lonni Necessary MD 01/14/2024 06:57 PM EST RP Workstation: [id number]   CT Head Wo Contrast Result Date: 01/14/2024 EXAM: CT HEAD WITHOUT CONTRAST 01/14/2024 06:50:17 PM TECHNIQUE: CT of the head was performed without the administration of intravenous contrast. Automated exposure control, iterative reconstruction, and/or weight based adjustment of the mA/kV was utilized to reduce the radiation dose to as low as reasonably achievable. COMPARISON: None available. CLINICAL HISTORY: Head trauma, minor (Age >= 65y). Patient reports fall last evening with left lower extremity pain. FINDINGS: BRAIN AND VENTRICLES: No acute hemorrhage. No evidence of acute infarct. No hydrocephalus. No extra-axial collection. No mass effect or midline shift. Scattered areas of ossification are present along the dura including the tentorium. Atherosclerotic calcifications are present in the cavernous carotid arteries bilaterally. No hyperdense vessel is present. ORBITS: No acute abnormality. SINUSES: No acute abnormality. SOFT TISSUES AND SKULL: No acute soft tissue abnormality. No skull fracture. IMPRESSION: 1. No acute intracranial abnormality related to the reported head trauma. Electronically signed by: Lonni Necessary MD 01/14/2024 06:55 PM EST RP Workstation: [id number]    EKG: My personal interpretation of EKG shows: Sinus rhythm without any ST changes. Abnormal R wave progression with late transition.   Assessment/Plan Principal Problem:   Traumatic  rhabdomyolysis Active Problems:   Hyperlipidemia   GAD (generalized anxiety disorder)   Essential hypertension   Diabetes type 2, controlled (HCC)   Bipolar 2 disorder (HCC)   Patient with elevated CK after a fall with downtime.  Lab work shows patient has AKI.  UA pending.  Patient is status 2 L in the ED.  Getting continuous IVF.  Etiology is fall with downtime leading to some muscle breakdown.  Compartments are soft.  Will trend renal function, electrolytes.  Magnesium and phosphorus pending.  Chronic problems: Hypertension: Continue home amlodipine , holding ARB and hydrochlorothiazide  Hyperlipidemia: Holding home statin given elevated LFTs Type 2 diabetes: Monitor CBGs, start SSI if needed Bipolar disorder: Continue home regimen.   VTE prophylaxis:  SQ Heparin  Diet: Regular Code Status:  Full Code Telemetry:  Admission status: Inpatient, Telemetry bed Patient is from: Home Anticipated d/c is to: Home Anticipated d/c is in: 2-3 days   Family Communication: Updated at bedside  Consults called: None   Severity of Illness: The appropriate patient status for this patient is INPATIENT. Inpatient status is judged to be reasonable and necessary in order to provide the required intensity of service to ensure the patient's safety. The patient's presenting symptoms, physical exam findings, and initial radiographic and laboratory data in the context of their chronic comorbidities is felt to place them at high risk for further clinical deterioration. Furthermore, it is not anticipated that the patient will be medically stable for discharge from the hospital within 2 midnights of admission.   * I certify that at the point of admission it is my clinical judgment that the patient will require inpatient hospital care spanning beyond 2 midnights from the point of admission due to high intensity of service, high risk for further deterioration and high frequency of surveillance  required.DEWAINE Morene Bathe, MD [facility]

## 2024-01-15 ENCOUNTER — Inpatient Hospital Stay (HOSPITAL_COMMUNITY)

## 2024-01-15 ENCOUNTER — Encounter (HOSPITAL_COMMUNITY): Payer: Self-pay | Admitting: Internal Medicine

## 2024-01-15 DIAGNOSIS — T796XXA Traumatic ischemia of muscle, initial encounter: Secondary | ICD-10-CM | POA: Diagnosis not present

## 2024-01-15 LAB — CBC
HCT: 30.9 % — ABNORMAL LOW (ref 36.0–46.0)
Hemoglobin: 10.2 g/dL — ABNORMAL LOW (ref 12.0–15.0)
MCH: 31 pg (ref 26.0–34.0)
MCHC: 33 g/dL (ref 30.0–36.0)
MCV: 93.9 fL (ref 80.0–100.0)
Platelets: 190 K/uL (ref 150–400)
RBC: 3.29 MIL/uL — ABNORMAL LOW (ref 3.87–5.11)
RDW: 15 % (ref 11.5–15.5)
WBC: 7.6 K/uL (ref 4.0–10.5)
nRBC: 0 % (ref 0.0–0.2)

## 2024-01-15 LAB — BASIC METABOLIC PANEL WITH GFR
Anion gap: 11 (ref 5–15)
Anion gap: 10 (ref 5–15)
BUN: 23 mg/dL (ref 8–23)
BUN: 27 mg/dL — ABNORMAL HIGH (ref 8–23)
CO2: 25 mmol/L (ref 22–32)
CO2: 23 mmol/L (ref 22–32)
Calcium: 8.1 mg/dL — ABNORMAL LOW (ref 8.9–10.3)
Calcium: 8 mg/dL — ABNORMAL LOW (ref 8.9–10.3)
Chloride: 99 mmol/L (ref 98–111)
Chloride: 99 mmol/L (ref 98–111)
Creatinine, Ser: 2.75 mg/dL — ABNORMAL HIGH (ref 0.44–1.00)
Creatinine, Ser: 3.25 mg/dL — ABNORMAL HIGH (ref 0.44–1.00)
GFR, Estimated: 17 mL/min — ABNORMAL LOW (ref >60)
GFR, Estimated: 14 mL/min — ABNORMAL LOW (ref >60)
Glucose, Bld: 128 mg/dL — ABNORMAL HIGH (ref 70–99)
Glucose, Bld: 113 mg/dL — ABNORMAL HIGH (ref 70–99)
Potassium: 3.2 mmol/L — ABNORMAL LOW (ref 3.5–5.1)
Potassium: 3.8 mmol/L (ref 3.5–5.1)
Sodium: 135 mmol/L (ref 135–145)
Sodium: 132 mmol/L — ABNORMAL LOW (ref 135–145)

## 2024-01-15 LAB — URINALYSIS, ROUTINE W REFLEX MICROSCOPIC
Bacteria, UA: NONE SEEN
Bilirubin Urine: NEGATIVE
Glucose, UA: NEGATIVE mg/dL
Ketones, ur: NEGATIVE mg/dL
Leukocytes,Ua: NEGATIVE
Nitrite: NEGATIVE
Protein, ur: 30 mg/dL — AB
Specific Gravity, Urine: 1.01 (ref 1.005–1.030)
pH: 5 (ref 5.0–8.0)

## 2024-01-15 LAB — CBG MONITORING, ED
Glucose-Capillary: 101 mg/dL — ABNORMAL HIGH (ref 70–99)
Glucose-Capillary: 94 mg/dL (ref 70–99)

## 2024-01-15 LAB — PHOSPHORUS: Phosphorus: 5.5 mg/dL — ABNORMAL HIGH (ref 2.5–4.6)

## 2024-01-15 LAB — MAGNESIUM: Magnesium: 1.3 mg/dL — ABNORMAL LOW (ref 1.7–2.4)

## 2024-01-15 LAB — SODIUM, URINE, RANDOM: Sodium, Ur: <30 mmol/L

## 2024-01-15 LAB — HEMOGLOBIN A1C
Hgb A1c MFr Bld: 6.3 % — ABNORMAL HIGH (ref 4.8–5.6)
Mean Plasma Glucose: 134.11 mg/dL

## 2024-01-15 LAB — GLUCOSE, CAPILLARY
Glucose-Capillary: 95 mg/dL (ref 70–99)
Glucose-Capillary: 118 mg/dL — ABNORMAL HIGH (ref 70–99)

## 2024-01-15 LAB — CREATININE, URINE, RANDOM: Creatinine, Urine: 153 mg/dL

## 2024-01-15 MED ORDER — QUETIAPINE FUMARATE 100 MG PO TABS
600.0000 mg | ORAL_TABLET | Freq: Every day | ORAL | Status: DC
  Administered 2024-01-15: 600 mg via ORAL
  Filled 2024-01-15: qty 6

## 2024-01-15 MED ORDER — SODIUM CHLORIDE 0.9 % IV SOLN: INTRAVENOUS | Status: DC

## 2024-01-15 MED ORDER — POTASSIUM CHLORIDE CRYS ER 20 MEQ PO TBCR
40.0000 meq | EXTENDED_RELEASE_TABLET | Freq: Once | ORAL | Status: AC
  Administered 2024-01-15: 40 meq via ORAL
  Filled 2024-01-15: qty 2

## 2024-01-15 MED ORDER — INSULIN ASPART 100 UNIT/ML IJ SOLN
0.0000 [IU] | Freq: Three times a day (TID) | INTRAMUSCULAR | Status: DC
  Administered 2024-01-16: 1 [IU] via SUBCUTANEOUS
  Administered 2024-01-17: 2 [IU] via SUBCUTANEOUS
  Administered 2024-01-18: 3 [IU] via SUBCUTANEOUS
  Administered 2024-01-19: 2 [IU] via SUBCUTANEOUS
  Administered 2024-01-19 – 2024-01-22 (×3): 1 [IU] via SUBCUTANEOUS
  Filled 2024-01-15 (×3): qty 1
  Filled 2024-01-15: qty 2
  Filled 2024-01-15: qty 1
  Filled 2024-01-15: qty 3
  Filled 2024-01-15: qty 2

## 2024-01-15 MED ORDER — METOPROLOL SUCCINATE ER 50 MG PO TB24
200.0000 mg | ORAL_TABLET | Freq: Every day | ORAL | Status: DC
  Administered 2024-01-15 – 2024-01-22 (×8): 200 mg via ORAL
  Filled 2024-01-15 (×9): qty 4

## 2024-01-15 MED ORDER — QUETIAPINE FUMARATE 100 MG PO TABS
600.0000 mg | ORAL_TABLET | Freq: Every day | ORAL | Status: DC
  Administered 2024-01-15 – 2024-01-21 (×7): 600 mg via ORAL
  Filled 2024-01-15 (×7): qty 6

## 2024-01-15 MED ORDER — FUROSEMIDE 10 MG/ML IJ SOLN
40.0000 mg | Freq: Two times a day (BID) | INTRAMUSCULAR | Status: DC
  Administered 2024-01-15 – 2024-01-16 (×2): 40 mg via INTRAVENOUS
  Filled 2024-01-15 (×2): qty 4

## 2024-01-15 MED ORDER — PANTOPRAZOLE SODIUM 40 MG PO TBEC
40.0000 mg | DELAYED_RELEASE_TABLET | Freq: Every day | ORAL | Status: DC
  Administered 2024-01-15 – 2024-01-22 (×8): 40 mg via ORAL
  Filled 2024-01-15 (×8): qty 1

## 2024-01-15 MED ORDER — QUETIAPINE FUMARATE 100 MG PO TABS: 300.0000 mg | ORAL_TABLET | Freq: Every day | ORAL | Status: DC

## 2024-01-15 MED ORDER — AMLODIPINE BESYLATE 5 MG PO TABS
10.0000 mg | ORAL_TABLET | Freq: Every day | ORAL | Status: DC
Start: 2024-01-15 — End: 2024-01-15

## 2024-01-15 NOTE — Hospital Course (Addendum)
 Tammy Mullins is a 73 y.o. female with PMH of hypertension, hyperlipidemia, bipolar disorder, GAD presenting to the ED after a fall. Reportedly she fell last night after slipping on water hitting left side including her hips, and she believes she passed out after the fall and woke up in the morning, was able to walk and went to the bathroom at that time and having pain in her left side. In the ED vitals stable labs shows renal failure with rhabdomyolysis CT head and CT cervical spine without any acute findings. it does show some progressive moderate foraminal narrowing bilaterally at C6/7 which appears to be a chronic finding. X-ray of left knee and left tib-fib without any acute findings. Patient was admitted for further management. Nephrology consulted  Subjective: Seen and examined today Resting comfortably.  No specific complaints no nausea tremors vomiting Overnight afebrile vitals stable 1-110 systolic, on room air  Labs reviewed sodium 131 creatinine further bumped   2.4>2.7> 3.2> 4.3, [place] down to 10342  Assessment and plan:  Traumatic rhabdomyolysis Fall at home: CT head CT spine x-ray of the left knee left tibia without acute fracture. Continue aggressive IV fluid hydration for rhabdomyolysis and trend CK which is improving nicely.  Continue supportive care PT OT fall precaution  Recent Labs  Lab 01/14/24 2006 01/16/24 0615  CKTOTAL >20,000* 10,342*   Acute renal failure Hypokalemia Hyperphosphatemia: In the setting of rhabdomyolysis.  Creatinine further worsening -continue to adjust diuretics/IVF as per nephrology, Bump in creatinine noticed this morning and I have notified nephrology> stopping Lasix and IV fluids this morning, requesting to transfer to [name] for impending dialysis. Monitor intake output,renal function and  avoid nephrotoxic medication hypotension dehydration and renally dose meds. Renal US - no hydro .  Replace electrolytes. Patient is in agreement  with transfer to [name] signed out to Dr. Cherlyn. Recent Labs    06/12/23 1543 12/12/23 1535 01/14/24 2006 01/15/24 0435 01/15/24 1527 01/16/24 0615  BUN 17 12 22 23  27* 32*  CREATININE 0.97 1.00 2.44* 2.75* 3.25* 4.23*  CO2 28 31 25 25 23 22   K 4.7 4.5 3.7 3.2* 3.8 3.2*   Intake/Output Summary (Last 24 hours) at 01/16/2024 1126 Last data filed at 01/16/2024 1000 Gross per 24 hour  Intake 2049.15 ml  Output 1250 ml  Net 799.15 ml    Reported hypoxia during sleep this morning: Chest x-rary RLL opacity, which may reflecta airspace disease vs atelectasis. Cont neb prn and IS  Hyperlipidemia Holding statin.  Essential hypertension BP soft and stable will continue to hold amlodipine  continue metoprolol  only.  Diabetes type 2, controlled Controlled, cont  sliding scale insulin . Recent Labs  Lab 01/15/24 0435 01/15/24 0755 01/15/24 1155 01/15/24 1653 01/15/24 2022 01/16/24 0735  GLUCAP  --  101* 94 95 118* 108*  HGBA1C 6.3*  --   --   --   --   --    Bipolar 2 disorder GAD: Mood stable, continue home meds   Mobility: PT Orders: Active  PT Follow up Rec: Skilled Nursing-Short Term Rehab (<3 Hours/Day)01/15/2024 1256   DVT prophylaxis: heparin injection 5,000 Units Start: 01/15/24 0600 Code Status:   Code Status: Full Code Family Communication: plan of care discussed with patient at bedside. Patient status is: Remains hospitalized because of severity of illness Level of care: Telemetry   Dispo: The patient is from: home alone            Anticipated disposition: TBD Objective: Vitals last 24 hrs: Vitals:  01/16/24 0533 01/16/24 0533 01/16/24 0823 01/16/24 0823  BP: (!) 108/57  118/68 118/68  Pulse: 85 85 77 79  Resp: 18     Temp: (!) 97.5 F (36.4 C)     TempSrc: Oral     SpO2: 92% 93% 93% 94%  Weight:      Height:        Physical Examination: General exam: alert awake oriented HEENT:Oral mucosa moist, Ear/Nose WNL grossly Respiratory system:  Bilaterally clear BS,no use of accessory muscle Cardiovascular system: S1 & S2 +, No JVD. Gastrointestinal system: Abdomen soft,NT,ND, BS+ Nervous System: Alert, awake, moving all extremities,and following commands. Extremities: extremities warm, leg edema  Skin: Warm, no rashes MSK: Normal muscle bulk,tone, power   Medications reviewed:  Scheduled Meds:  Chlorhexidine  Gluconate Cloth  6 each Topical Daily   heparin  5,000 Units Subcutaneous Q8H   insulin  aspart  0-9 Units Subcutaneous TID WC   metoprolol   200 mg Oral Daily   pantoprazole   40 mg Oral Daily   QUEtiapine   600 mg Oral QHS   sodium chloride  flush  3 mL Intravenous Q12H   Continuous Infusions:   Diet: Diet Order             Diet Heart Room service appropriate? Yes; Fluid consistency: Thin  Diet effective now

## 2024-01-15 NOTE — Progress Notes (Signed)
-----------------------------------------------------------  [facility] --------------------------------------------------------D(Data) A(Action) R(response) Note------------------------------------------------  Patient Name: Tammy Mullins Patient DOB: 1950/03/11 Date: @TODAY @      Data: Reviewed labs, VS, notes.    Action: No action needed at this time.      Response:       Sharolyn Batman, RN [facility]

## 2024-01-15 NOTE — ED Notes (Signed)
 RN to assess patient this morning - patient sleeping. O2 monitoring device placed on patients warm finger to find patient at 73% with good waveform. Patient woken up and attempted to place nasal canula when patient dropped to 65%. Nonrebreather used to get patient to 97% then placed on nasal canula at 3L. Patient resting at 85%. Hospitalist notified.

## 2024-01-15 NOTE — Evaluation (Signed)
 Physical Therapy Evaluation Patient Details Name: Tammy Mullins MRN: [id number] DOB: [birth year] Today's Date: 01/15/2024  History of Present Illness  Tammy Mullins is a 73 y.o. year old female s/p fall hitting L side of head, CT no acute findings, dx with Traumatic rhabdomyolysis; placed on 3 L O2 in ED at [facility] PMH:  hypertension, hyperlipidemia, bipolar disorder, GAD  Clinical Impression  Pt admitted with above diagnosis.  Pt currently with functional limitations due to the deficits listed below (see PT Problem List). Pt will benefit from acute skilled PT to increase their independence and safety with mobility to allow discharge.     The patient  is sleepy , dozing at times until mobilized to sitting. Patient   unable to safely stand  to pivot, Used Camie Ip lift to transfer to [facility] then back to stretcher.  Patient  reporting L knee  and thigh pain. Noted blisters  on medial  left leg above and below knee which appeared to  open   when mobilized to [facility].  Patient will benefit from continued inpatient follow up therapy, <3 hours/day.  Patient's SPO2 noted to drop into 80's when on RA during mobility. Replaced back on 3 L, back to 955.       If plan is discharge home, recommend the following: Two people to help with walking and/or transfers;A lot of help with bathing/dressing/bathroom;Assist for transportation;Help with stairs or ramp for entrance   Can travel by private vehicle   No    Equipment Recommendations None recommended by PT  Recommendations for Other Services       Functional Status Assessment Patient has had a recent decline in their functional status and demonstrates the ability to make significant improvements in function in a reasonable and predictable amount of time.     Precautions / Restrictions Precautions Precautions: Fall      Mobility  Bed Mobility Overal bed mobility: Needs Assistance Bed Mobility: Rolling, Sidelying to Sit Rolling: Max assist, +2 for  physical assistance, +2 for safety/equipment Sidelying to sit: +2 for physical assistance, Max assist, +2 for safety/equipment       General bed mobility comments: assist to roll with bed sheet, max assist to sit upright and scoot to bed edged.    Transfers Overall transfer level: Needs assistance Equipment used: Rolling walker (2 wheels) Transfers: Sit to/from Stand, Bed to chair/wheelchair/BSC Sit to Stand: Max assist, +2 physical assistance, +2 safety/equipment           General transfer comment: stood at [facility]  with max support, unable to fully stand at RW. , Able to stand  at [facility] and pull to stand, LLE tends to abd. Assist to place the  foot back on footplate. Patient assisted to [facility] then back to stretcher via Camie Ip. Transfer via Lift Equipment: Stedy  Ambulation/Gait                  Stairs            Wheelchair Mobility     Tilt Bed    Modified Rankin (Stroke Patients Only)       Balance Overall balance assessment: Needs assistance Sitting-balance support: Bilateral upper extremity supported, Feet supported Sitting balance-Leahy Scale: Fair Sitting balance - Comments: close guard due to decreased  Left foot support Postural control: Posterior lean Standing balance support: During functional activity, Reliant on assistive device for balance, Bilateral upper extremity supported Standing balance-Leahy Scale: Poor Standing balance comment: in STEDY  Pertinent Vitals/Pain Pain Assessment Pain Assessment: Faces Faces Pain Scale: Hurts even more Pain Location: left leg, knee, has blisters on medial thigh and lower leg., right hans goit Pain Descriptors / Indicators: Burning, Grimacing Pain Intervention(s): Monitored during session, Repositioned, Limited activity within patient's tolerance    Home Living Family/patient expects to be discharged to:: Private residence Living Arrangements: Alone Available  Help at Discharge: Available 24 hours/day Type of Home: House Home Access: Stairs to enter Entrance Stairs-Rails: None Entrance Stairs-Number of Steps: 1   Home Layout: One level Home Equipment: Agricultural Consultant (2 wheels);Rollator (4 wheels);Cane - single point;Grab bars - tub/shower Additional Comments: states she has family close    Prior Function Prior Level of Function : Independent/Modified Independent             Mobility Comments: uses RW ADLs Comments: indep     Extremity/Trunk Assessment   Upper Extremity Assessment Upper Extremity Assessment: Right hand dominant;Generalized weakness;RUE deficits/detail RUE Deficits / Details: 3/4 ranges in distal R hand with report of hx of gout RUE Coordination: decreased fine motor    Lower Extremity Assessment Lower Extremity Assessment: RLE deficits/detail;LLE deficits/detail RLE Deficits / Details: grossly wfl LLE Deficits / Details: Leg tends to Abd in bed and when standing, reports    Cervical / Trunk Assessment Cervical / Trunk Assessment: Normal  Communication   Communication Communication: No apparent difficulties Factors Affecting Communication: Reduced clarity of speech    Cognition Arousal: Alert Behavior During Therapy: WFL for tasks assessed/performed   PT - Cognitive impairments: Difficult to assess, Orientation   Orientation impairments: Time                   PT - Cognition Comments: appears dozing, stimulation to stay alert until sitting. Following commands: Intact, Impaired Following commands impaired: Follows one step commands with increased time     Cueing Cueing Techniques: Verbal cues, Tactile cues     General Comments General comments (skin integrity, edema, etc.): L LE edema overall +2; has several open blisters from lateral and medical thigh-shin, on 3 ltrs O2 with sats 92-100%; BP 149/79, HR 90 supine; commode transfer with BP 92/56 and HR 92 ending SpO2 100% back in bed     Exercises     Assessment/Plan    PT Assessment Patient needs continued PT services  PT Problem List Decreased strength;Decreased activity tolerance;Decreased range of motion;Decreased cognition;Decreased knowledge of use of DME;Decreased safety awareness;Decreased balance;Decreased mobility;Decreased knowledge of precautions;Cardiopulmonary status limiting activity       PT Treatment Interventions DME instruction;Gait training;Cognitive remediation;Patient/family education;Functional mobility training;Therapeutic activities;Therapeutic exercise;Balance training    PT Goals (Current goals can be found in the Care Plan section)  Acute Rehab PT Goals Patient Stated Goal: agreed to mobility PT Goal Formulation: With patient Time For Goal Achievement: 01/29/24 Potential to Achieve Goals: Fair    Frequency Min 2X/week     Co-evaluation PT/OT/SLP Co-Evaluation/Treatment: Yes Reason for Co-Treatment: For patient/therapist safety;To address functional/ADL transfers PT goals addressed during session: Mobility/safety with mobility OT goals addressed during session: ADL's and self-care       AM-PAC PT 6 Clicks Mobility  Outcome Measure Help needed turning from your back to your side while in a flat bed without using bedrails?: Total Help needed moving from lying on your back to sitting on the side of a flat bed without using bedrails?: Total Help needed moving to and from a bed to a chair (including a wheelchair)?: Total Help needed standing up from a  chair using your arms (e.g., wheelchair or bedside chair)?: Total Help needed to walk in hospital room?: Total Help needed climbing 3-5 steps with a railing? : Total 6 Click Score: 6    End of Session Equipment Utilized During Treatment: Gait belt Activity Tolerance: Patient tolerated treatment well Patient left: in bed;with call bell/phone within reach Nurse Communication: Mobility status;Need for lift equipment PT Visit Diagnosis:  Unsteadiness on feet (R26.81);Muscle weakness (generalized) (M62.81);History of falling (Z91.81)    Time: 8952-8884 PT Time Calculation (min) (ACUTE ONLY): 28 min   Charges:   PT Evaluation $PT Eval Low Complexity: 1 Low   PT General Charges $$ ACUTE PT VISIT: 1 Visit         Darice Potters PT Acute Rehabilitation Services Office [phone]   Potters Darice Norris 01/15/2024, 1:01 PM

## 2024-01-15 NOTE — Plan of Care (Signed)
  Problem: Education: Goal: Ability to describe self-care measures that may prevent or decrease complications (Diabetes Survival Skills Education) will improve Outcome: Progressing   Problem: Coping: Goal: Ability to adjust to condition or change in health will improve Outcome: Progressing   Problem: Fluid Volume: Goal: Ability to maintain a balanced intake and output will improve Outcome: Progressing   Problem: Metabolic: Goal: Ability to maintain appropriate glucose levels will improve Outcome: Progressing   Problem: Nutritional: Goal: Maintenance of adequate nutrition will improve Outcome: Progressing   Problem: Tissue Perfusion: Goal: Adequacy of tissue perfusion will improve Outcome: Progressing   Problem: Clinical Measurements: Goal: Diagnostic test results will improve Outcome: Progressing   Problem: Elimination: Goal: Will not experience complications related to bowel motility Outcome: Progressing   Problem: Safety: Goal: Ability to remain free from injury will improve Outcome: Progressing   Problem: Skin Integrity: Goal: Risk for impaired skin integrity will decrease Outcome: Progressing

## 2024-01-15 NOTE — Plan of Care (Signed)
  Problem: Coping: Goal: Ability to adjust to condition or change in health will improve Outcome: Progressing   Problem: Fluid Volume: Goal: Ability to maintain a balanced intake and output will improve Outcome: Progressing   Problem: Metabolic: Goal: Ability to maintain appropriate glucose levels will improve Outcome: Progressing   Problem: Nutritional: Goal: Maintenance of adequate nutrition will improve Outcome: Progressing   Problem: Skin Integrity: Goal: Risk for impaired skin integrity will decrease Outcome: Progressing   Problem: Tissue Perfusion: Goal: Adequacy of tissue perfusion will improve Outcome: Progressing   Problem: Education: Goal: Knowledge of General Education information will improve Description: Including pain rating scale, medication(s)/side effects and non-pharmacologic comfort measures Outcome: Progressing   Problem: Clinical Measurements: Goal: Ability to maintain clinical measurements within normal limits will improve Outcome: Progressing Goal: Will remain free from infection Outcome: Progressing Goal: Diagnostic test results will improve Outcome: Progressing Goal: Respiratory complications will improve Outcome: Progressing Goal: Cardiovascular complication will be avoided Outcome: Progressing   Problem: Activity: Goal: Risk for activity intolerance will decrease Outcome: Progressing   Problem: Nutrition: Goal: Adequate nutrition will be maintained Outcome: Progressing   Problem: Coping: Goal: Level of anxiety will decrease Outcome: Progressing   Problem: Elimination: Goal: Will not experience complications related to urinary retention Outcome: Progressing   Problem: Pain Managment: Goal: General experience of comfort will improve and/or be controlled Outcome: Progressing   Problem: Safety: Goal: Ability to remain free from injury will improve Outcome: Progressing   Problem: Skin Integrity: Goal: Risk for impaired skin  integrity will decrease Outcome: Progressing

## 2024-01-15 NOTE — Consult Note (Addendum)
 Renal Service Consult Note [facility] Lamar JONETTA Fret, MD   Patient: Tammy Mullins Date: 01/15/2024 Requesting Physician: Dr. Christobal   Reason for Consult: Renal failure  HPI: The patient is a 73 y.o. year-old w/ PMH as below who presented to ED after falling at home the passed out. Was down for a time, then woke up this am and was able to walk. In ED BP 122/75, HR 86, RR 12, temp 98. Labs showed creat 2.4 (b/l 1.0), and CPK > 20,000. K+ 3.7, CO2 25, bun 22, alb 3.8. WBC 8K.  Pt rec'd 2 L bolus and LR at 150 cc/hr. Later overnight SpO2 dropped into high 80s and IVF's were stopped, and O2 [place] started. CXR taken 7 am today showed no edema, mild CM. Pt was admitted. We are asked to see for renal failure.      Pt seen in ED room. Pt is pleasant, some R leg pain and swelling since fell yesterday. No hx kidney failure. No use of nsaids at home.      ROS - denies CP, no joint pain, no HA, no blurry vision, no rash, no diarrhea, no nausea/ vomiting     Past Medical History      Past Medical History:  Diagnosis Date   Allergic rhinitis, cause unspecified     Allergy     Anxiety state, unspecified     Aortic atherosclerosis 07/11/2022   Arthritis     Attention deficit disorder without mention of hyperactivity     Barrett's esophagus     Blood transfusion without reported diagnosis 2018-10   Chronic pain syndrome     Colon ulcer - IC valve 08/10/2016   Depression     Diabetes type 2, controlled (HCC) 01/09/2014   Diverticulosis of colon (without mention of hemorrhage)     Esophageal reflux     Fibromyalgia     Gout, unspecified     Headache     Hiatal hernia     Irritable bowel syndrome     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     Myalgia and myositis, unspecified     Osteoarthrosis, unspecified whether generalized or localized, unspecified site     Personal history of unspecified circulatory disease     Pure hypercholesterolemia     Radial styloid  tenosynovitis     Unspecified essential hypertension     Unspecified nonpsychotic mental disorder     Unspecified pruritic disorder          Past Surgical History       Past Surgical History:  Procedure Laterality Date   ANKLE SURGERY Right     APPENDECTOMY       BACK SURGERY   03/19/2021   CARPAL TUNNEL RELEASE Right     CERVICAL SPINE SURGERY        plates and pins   CHOLECYSTECTOMY N/A 06/26/2015    Procedure: LAPAROSCOPIC CHOLECYSTECTOMY;  Surgeon: Lynda Leos, MD;  Location: [facility] OR;  Service: General;  Laterality: N/A;   COLONOSCOPY       COLONOSCOPY   2019   ESOPHAGOGASTRODUODENOSCOPY       ESOPHAGOGASTRODUODENOSCOPY N/A 12/04/2016    Procedure: ESOPHAGOGASTRODUODENOSCOPY (EGD);  Surgeon: Aneita Gwendlyn DASEN, MD;  Location: [facility] ENDOSCOPY;  Service: Endoscopy;  Laterality: N/A;   LAPAROSCOPIC APPENDECTOMY N/A 06/26/2015    Procedure: LAPAROSCOPIC APPENDECTOMY;  Surgeon: Lynda Leos, MD;  Location: [facility] OR;  Service: General;  Laterality: N/A;   ROTATOR CUFF REPAIR Right  SALPINGOOPHORECTOMY   Left    ectopic 1980   TOTAL SHOULDER ARTHROPLASTY       TRANSFORAMINAL LUMBAR INTERBODY FUSION (TLIF) WITH PEDICLE SCREW FIXATION 2 LEVEL N/A 01/28/2019    Procedure: TRANSFORAMINAL LUMBAR INTERBODY FUSION LUMBAR THREE-FOUR, LUMBAR FOUR-FIVE;  Surgeon: Joshua Alm RAMAN, MD;  Location: [facility] OR;  Service: Neurosurgery;  Laterality: N/A;  posterior   UPPER GASTROINTESTINAL ENDOSCOPY            Family History       Family History  Problem Relation Age of Onset   Lung disease Mother          ?    Glaucoma Mother     Cirrhosis Father     Hypertension Sister     Heart disease Sister     Heart attack Sister     Hypertension Brother     Glaucoma Brother     Prostate cancer Brother     Hypertension Brother     Cancer Maternal Grandmother          ?   Kidney disease Neg Hx     Diabetes Neg Hx     Colon cancer Neg Hx     Colon polyps Neg Hx     Rectal cancer Neg Hx     Stomach  cancer Neg Hx          Social History  reports that she has never smoked. She has never been exposed to tobacco smoke. She has never used smokeless tobacco. She reports that she does not drink alcohol and does not use drugs. Allergies  Allergies       Allergies  Allergen Reactions   Crestor  [Rosuvastatin  Calcium ] Other (See Comments)      Causes severe body aches   Dicyclomine  Other (See Comments)      Body and face rash, swelling.   Topiramate  Diarrhea   Dorethia Marten ] Other (See Comments)      GI upset in hight doses can take a 81mg    Celebrex [Celecoxib] Swelling and Rash      Swelling in legs      Home medications        Prior to Admission medications   Medication Sig Start Date End Date Taking? Authorizing Provider  allopurinol  (ZYLOPRIM ) 300 MG tablet TAKE 1 AND 1/2 TABLETS(450 MG) BY MOUTH DAILY 10/22/23   Yes Deveshwar, Maya, MD  amLODipine  (NORVASC ) 10 MG tablet TAKE 1 TABLET DAILY 10/21/13   Yes O'Sullivan, Melissa, NP  Blood Glucose Monitoring Suppl (ONETOUCH VERIO FLEX SYSTEM) w/Device KIT Use as directed     Yes [provider]  buPROPion  (WELLBUTRIN  XL) 150 MG 24 hr tablet Take 3 tablets (450 mg total) by mouth daily. 10/05/23   Yes Hurst, Teresa T, PA-C  diphenoxylate-atropine (LOMOTIL) 2.5-0.025 MG tablet 1 -2 tablets four times a day for 2 days and then as needed Orally Four times a day 01/11/24   Yes [provider]  DULoxetine  (CYMBALTA ) 60 MG capsule Take 2 capsules (120 mg total) by mouth daily. 10/05/23   Yes Rhys Boyer T, PA-C  famotidine (PEPCID) 40 MG tablet Take 40 mg by mouth daily.     Yes [provider]  ferrous sulfate  325 (65 FE) MG tablet Take 325 mg by mouth daily with breakfast.     Yes [provider]  gabapentin  (NEURONTIN ) 600 MG tablet Take 600 mg by mouth every 8 (eight) hours. 02/27/23   Yes [provider]  glucose blood (  ONETOUCH VERIO) test strip Use as instructed     Yes [provider]   hydrochlorothiazide  (HYDRODIURIL ) 50 MG tablet Take 50 mg by mouth daily.     Yes [provider]  losartan  (COZAAR ) 100 MG tablet 1 tablet by mouth every morning     Yes [provider]  methocarbamol  (ROBAXIN ) 500 MG tablet 1 tablet by mouth 3 times a day; Duration: 30 days     Yes [provider]  metoprolol  (TOPROL -XL) 200 MG 24 hr tablet Take 200 mg by mouth daily. 04/11/22   Yes [provider]  omeprazole  (PRILOSEC) 40 MG capsule Take 1 capsule (40 mg total) by mouth in the morning and at bedtime. 03/09/23   Yes May, Deanna J, NP  QUEtiapine  (SEROQUEL ) 300 MG tablet Take 2 tablets (600 mg total) by mouth at bedtime. 10/05/23   Yes Hurst, Verneita T, PA-C  zonisamide  (ZONEGRAN ) 100 MG capsule TAKE 1 CAPSULE(100 MG) BY MOUTH AT BEDTIME Patient taking differently: Take 100 mg by mouth at bedtime as needed. 06/06/23   Yes Gayland Lauraine PARAS, NP  hyoscyamine  (LEVSIN  SL) 0.125 MG SL tablet SMARTSIG:1-2 Tablet(s) Sublingual Every 4-6 Hours PRN 03/29/22     [provider]  Lancets (ONETOUCH ULTRASOFT) lancets Use as instructed       [provider]              Vitals:    01/15/24 0800 01/15/24 0959 01/15/24 1001 01/15/24 1120  BP: 135/74   (!) 156/76 (!) 140/80  Pulse: 89   90 79  Resp: 14     14  Temp:   (!) 97.5 F (36.4 C)   (!) 97.5 F (36.4 C)  TempSrc:   Oral   Oral  SpO2: 95%     100%  Weight:          Height:            Exam Gen alert, no distress, [place] O2, pleasant  Sclera anicteric, throat clear  No jvd or bruits Chest clear bilat to bases RRR no MRG Abd soft ntnd no mass or ascites +bs Ext mild 1+ RLE edema, no LLE Neuro is alert, Ox 3 , nf     Home bp meds: Norvasc  10 every day Losartan  100mg  qam Toprol  xl 200mg  every day     Date                             Creat               eGFR (ml/min) 2014- feb 2024  0.70- 0.95 May 2024                    1.03 June 2023                    0.97 12/12/23                       1.00 01/14/24                      2.44  On admit last night 01/15/24                      2.75  Day of consult     CPK > 20,000 UA pend UNa, UCr pend Renal US : 10.5/10.1 cm kidneys, no hydronephrosis CXR: mild CM,  no edema/ vasc congestion   Assessment/ Plan: AKI: b/l creatinine is 1.0 from Oct 2025, eGFR > 60 ml/min. Creatinine is 2.4 on admit in the setting of a fall at home, and lying on the floor for sometime passed out. She rec'd 2 L bolus and LR then at 150 cc/hr for AKI w/ rhabdomyolysis. Then O2 sats dropped to 88% and [place] O2 was needed. Due to SpO2 drop the high-rate IVF's were stopped. CXR showed no edema this am. AKI suspect is likely due to acute rhabdomyolysis + ARB effects.  ARB is on hold. Will resume IVFs at 85 cc/hr and add IV lasix 40mg  bid. Supportive care otherwise for AKI due to ATN/ rhabomyolysis. Will need foley cath. UA and urine lytes pending. Will follow.  Acute rhabdomyolysis: consequence of passing out and lying on a hard floor (muscle crush injury).    Myer Fret  MD [facility] 01/15/2024, 12:22 PM   Last Labs      Recent Labs  Lab 01/14/24 2006 01/15/24 0435  CREATININE 2.44* 2.75*  K 3.7 3.2*      Inpatient medications:  heparin  5,000 Units Subcutaneous Q8H   insulin  aspart  0-9 Units Subcutaneous TID WC   metoprolol   200 mg Oral Daily   pantoprazole   40 mg Oral Daily   QUEtiapine   600 mg Oral QHS   sodium chloride  flush  3 mL Intravenous Q12H         sodium chloride  65 mL/hr at 01/15/24 1001        acetaminophen  **OR** acetaminophen , senna-docusate

## 2024-01-15 NOTE — Progress Notes (Signed)
 PROGRESS NOTE Tammy Mullins  FMW:[id number] DOB: [birth year] DOA: 01/14/2024 PCP: Wonda Worth SQUIBB, PA  Brief Narrative/Hospital Course: Tammy Mullins is a 73 y.o. female with PMH of hypertension, hyperlipidemia, bipolar disorder, GAD presenting to the ED after a fall. Reportedly she fell last night after slipping on water hitting left side including her hips, and she believes she passed out after the fall and woke up in the morning, was able to walk and went to the bathroom at that time and having pain in her left side. In the ED vitals stable labs shows renal failure with rhabdomyolysis CT head and CT cervical spine without any acute findings. it does show some progressive moderate foraminal narrowing bilaterally at C6/7 which appears to be a chronic finding. X-ray of left knee and left tib-fib without any acute findings. Patient was admitted for further manageme  Subjective: Seen and examined today Alert awake having female Had some urine output earlier no new complaints Overnight on room air this morning nurse reported patient hypoxic needing oxygen subsequently needing 3 L- cxr ordered - see below afebrile, VSS, Labs mild hypokalemia 3.2 , Creatinine up 2.4>2.7  Assessment and plan:  Traumatic rhabdomyolysis Fall at home: CT head CT spine x-ray of the left knee left tibia without acute fracture. Continue aggressive IV fluid hydration monitor urine output renal function CK Recent Labs  Lab 01/14/24 2006  CKTOTAL >20,000*   Acute renal failure Hypokalemia Hyperphosphatemia: In the setting of rhabdomyolysis monitor urine output renal function avoid nephrotoxic medication hypotension dehydration and renally dose meds. C/s nephro and discussed renal ultrasound obtained no significant finding.  Monitor and replace electrolytes Recent Labs    06/12/23 1543 12/12/23 1535 01/14/24 2006 01/15/24 0435  BUN 17 12 22 23   CREATININE 0.97 1.00 2.44* 2.75*  CO2 28 31 25 25   K 4.7 4.5 3.7  3.2*   Reported hypoxia during sleep this morning: Chest x-ray DONW> right lower lung opacity, which may reflecta airspace disease versus Atelectasis.  Add neb prn and IS **  Hyperlipidemia Holding statin.  Essential hypertension BP controlled-on soft side hold amlodipine  continue metoprolol  only  Diabetes type 2, controlled Put on sliding scale insulin . Recent Labs  Lab 01/15/24 0435 01/15/24 0755  GLUCAP  --  101*  HGBA1C 6.3*  --    Bipolar 2 disorder GAD: Modest will continue home meds   Mobility: PT Orders: Active  PT Follow up Rec:    DVT prophylaxis: heparin injection 5,000 Units Start: 01/15/24 0600 Code Status:   Code Status: Full Code Family Communication: plan of care discussed with patient at bedside. Patient status is: Remains hospitalized because of severity of illness Level of care: Telemetry   Dispo: The patient is from: home alone            Anticipated disposition: TBD Objective: Vitals last 24 hrs: Vitals:   01/15/24 0800 01/15/24 0959 01/15/24 1001 01/15/24 1120  BP: 135/74  (!) 156/76 (!) 140/80  Pulse: 89  90 79  Resp: 14   14  Temp:  (!) 97.5 F (36.4 C)  (!) 97.5 F (36.4 C)  TempSrc:  Oral  Oral  SpO2: 95%   100%  Weight:      Height:        Physical Examination: General exam: alert awake HEENT:Oral mucosa moist, Ear/Nose WNL grossly Respiratory system: Bilaterally clear BS,no use of accessory muscle Cardiovascular system: S1 & S2 +, No JVD. Gastrointestinal system: Abdomen soft,NT,ND, BS+ Nervous System: Alert, awake, moving all extremities,and  following commands. Extremities: extremities warm, leg edema  Skin: Warm, no rashes MSK: Normal muscle bulk,tone, power   Medications reviewed:  Scheduled Meds:  heparin  5,000 Units Subcutaneous Q8H   insulin  aspart  0-9 Units Subcutaneous TID WC   metoprolol   200 mg Oral Daily   pantoprazole   40 mg Oral Daily   QUEtiapine   600 mg Oral QHS   sodium chloride  flush  3 mL  Intravenous Q12H   Continuous Infusions:  sodium chloride  65 mL/hr at 01/15/24 1001   Diet: Diet Order             Diet Heart Room service appropriate? Yes; Fluid consistency: Thin  Diet effective now                   Data Reviewed: I have personally reviewed following labs and imaging studies ( see epic result tab) CBC: Recent Labs  Lab 01/14/24 2006 01/15/24 0435  WBC 8.9 7.6  NEUTROABS 7.0  --   HGB 13.1 10.2*  HCT 40.5 30.9*  MCV 93.3 93.9  PLT 251 190   CMP: Recent Labs  Lab 01/14/24 2006 01/15/24 0435  NA 138 135  K 3.7 3.2*  CL 100 99  CO2 25 25  GLUCOSE 142* 128*  BUN 22 23  CREATININE 2.44* 2.75*  CALCIUM  9.1 8.1*  PHOS  --  5.5*   GFR: Estimated Creatinine Clearance: 18.5 mL/min (A) (by C-G formula based on SCr of 2.75 mg/dL (H)). Recent Labs  Lab 01/14/24 2006  AST 829*  ALT 171*  ALKPHOS 89  BILITOT 0.4  PROT 6.8  ALBUMIN 3.8   No results for input(s): LIPASE, AMYLASE in the last 168 hours. No results for input(s): AMMONIA in the last 168 hours. Coagulation Profile: No results for input(s): INR, PROTIME in the last 168 hours. Unresulted Labs (From admission, onward)     Start     Ordered   01/16/24 0500  Comprehensive metabolic panel with GFR  Daily,   R      01/15/24 0737   01/16/24 0500  CBC  Daily,   R      01/15/24 0737   01/16/24 0500  CK  Daily,   R      01/15/24 0918   01/15/24 1400  Basic metabolic panel  Now then every 24 hours,   R (with TIMED occurrences)      01/15/24 0917   01/14/24 2245  Magnesium  Add-on,   AD        01/14/24 2246           Antimicrobials/Microbiology: Anti-infectives (From admission, onward)    None         Component Value Date/Time   SDES URINE, CLEAN CATCH 05/14/2015 1213   SPECREQUEST Normal 05/14/2015 1213   CULT MULTIPLE SPECIES PRESENT, SUGGEST RECOLLECTION 05/14/2015 1213   REPTSTATUS 05/16/2015 FINAL 05/14/2015 1213    Procedures:  Mennie LAMY, MD [facility] 01/15/2024, 11:47 AM

## 2024-01-15 NOTE — ED Notes (Signed)
 Patient does not have to urinate at this time. Lab called to see if any urine specimens were sent down overnight to which there were not.

## 2024-01-15 NOTE — Evaluation (Signed)
 Occupational Therapy Evaluation Patient Details Name: Tammy Mullins MRN: [id number] DOB: 1951-01-06 Today's Date: 01/15/2024   History of Present Illness   Tammy Mullins is a 73 y.o. year old female s/p fall hitting L side of head, CT spine, hip and head with no acute findings, dx with Traumatic rhabdomyolysis; placed on 3 L O2 in ED at [facility] PMH:  hypertension, hyperlipidemia, bipolar disorder, GAD. gout     Clinical Impressions PTA, patient lives at home alone, was indep and with local family who are able to assist intermittently from outside home. Currently, patient presents with deficits outlined below (see OT Problem List for details) most significantly pain, decreased cognitive/processing, skin integrity, activity tolerance now on supplemental O2, muscle weakness, balance and coordination deficits impacting ADL's (max A LB) and functional mobility (+2 STEDY) performance. Patient will benefit from continued inpatient follow up therapy, <3 hours/day. Patient requires continued Acute care hospital level OT services to progress safety and functional performance and allow for discharge.       If plan is discharge home, recommend the following:   Two people to help with walking and/or transfers;A lot of help with bathing/dressing/bathroom;Assistance with cooking/housework;Assistance with feeding;Direct supervision/assist for medications management;Direct supervision/assist for financial management;Assist for transportation;Help with stairs or ramp for entrance;Supervision due to cognitive status     Functional Status Assessment   Patient has had a recent decline in their functional status and demonstrates the ability to make significant improvements in function in a reasonable and predictable amount of time.     Equipment Recommendations   Other (comment) (TBD)      Precautions/Restrictions   Precautions Precautions: Fall Recall of Precautions/Restrictions:  Intact Precaution/Restrictions Comments: L LE blisters Restrictions Weight Bearing Restrictions Per Provider Order: No     Mobility Bed Mobility Overal bed mobility: Needs Assistance Bed Mobility: Rolling, Sidelying to Sit Rolling: Max assist, +2 for physical assistance, +2 for safety/equipment Sidelying to sit: +2 for physical assistance, Max assist, +2 for safety/equipment       General bed mobility comments: assist to roll with bed sheet, max assist to sit upright and scoot to bed edged.    Transfers Overall transfer level: Needs assistance Equipment used: Rolling walker (2 wheels) Transfers: Sit to/from Stand, Bed to chair/wheelchair/BSC Sit to Stand: Max assist, +2 physical assistance, +2 safety/equipment           General transfer comment: stood at [facility]  with max support, unable to fully stand at RW. , Able to stand  at [facility] and pull to stand, LLE tends to abd. Assist to place the  foot back on footplate. patient assisted to [facility] then back to stretcher via Tammy Mullins. Transfer via Lift Equipment: Stedy    Balance Overall balance assessment: Needs assistance Sitting-balance support: Bilateral upper extremity supported, Feet supported Sitting balance-Tammy Mullins Scale: Fair Sitting balance - Comments: close guard due to decreased  Left foot support Postural control: Posterior lean Standing balance support: During functional activity, Reliant on assistive device for balance, Bilateral upper extremity supported Standing balance-Tammy Mullins Scale: Poor Standing balance comment: in STEDY                           ADL either performed or assessed with clinical judgement   ADL Overall ADL's : Needs assistance/impaired Eating/Feeding: Set up;Bed level   Grooming: Wash/dry hands;Wash/dry face;Contact guard assist;Sitting   Upper Body Bathing: Minimal assistance;Sitting   Lower Body Bathing: Maximal assistance;Bed level   Upper Body Dressing :  Minimal assistance;Sitting    Lower Body Dressing: Total assistance;+2 for physical assistance;+2 for safety/equipment;Sitting/lateral leans   Toilet Transfer: +2 for physical assistance;+2 for safety/equipment;Maximal assistance;BSC/3in1 Toilet Transfer Details (indicate cue type and reason): use of STEDY Toileting- Clothing Manipulation and Hygiene: Maximal assistance;Sitting/lateral lean       Functional mobility during ADLs: Maximal assistance;+2 for physical assistance;+2 for safety/equipment (use of STEDY) General ADL Comments: unable to perform reach to L LE, unable to STS without STEDY support     Vision Baseline Vision/History: 0 No visual deficits              Pertinent Vitals/Pain Pain Assessment Pain Assessment: Faces Faces Pain Scale: Hurts even more Pain Location: L LE Pain Descriptors / Indicators: Aching, Grimacing, Guarding Pain Intervention(s): Monitored during session, Premedicated before session, Repositioned     Extremity/Trunk Assessment Upper Extremity Assessment Upper Extremity Assessment: Right hand dominant;Generalized weakness RUE Deficits / Details: 3/4 ranges in distal R hand with report of hx of gout RUE Coordination: decreased fine motor   Lower Extremity Assessment Lower Extremity Assessment: Defer to PT evaluation RLE Deficits / Details: grossly wfl LLE Deficits / Details: Leg tends to Abd in bed and when standing, reports   Cervical / Trunk Assessment Cervical / Trunk Assessment: Other exceptions (increased body habitus)   Communication Communication Communication: No apparent difficulties Factors Affecting Communication: Reduced clarity of speech   Cognition Arousal: Lethargic, Alert (improved throughout session) Behavior During Therapy: WFL for tasks assessed/performed Cognition: Cognition impaired   Orientation impairments: Time Awareness: Intellectual awareness impaired, Online awareness impaired Memory impairment (select all impairments): Short-term  memory Attention impairment (select first level of impairment): Sustained attention Executive functioning impairment (select all impairments): Reasoning, Sequencing, Problem solving OT - Cognition Comments: slower processing, increased time and repetition for commands during transfers and motor skills                 Following commands: Impaired Following commands impaired: Follows one step commands with increased time     Cueing  General Comments   Cueing Techniques: Verbal cues;Gestural cues  L LE edema overall +2; has several open blisters from lateral and medical thigh-shin, on 3 ltrs O2 with sats 92-100%; BP 149/79, HR 90 supine; commode transfer with BP 92/56 and HR 92 ending SpO2 100% back in bed           Home Living Family/patient expects to be discharged to:: Private residence Living Arrangements: Alone Available Help at Discharge: Available 24 hours/day;Family Type of Home: House Home Access: Stairs to enter [employer] of Steps: 1 Entrance Stairs-Rails: None Home Layout: One level     Bathroom Shower/Tub: Producer, Television/film/video: Handicapped height     Home Equipment: Agricultural Consultant (2 wheels);Rollator (4 wheels);Cane - single point;Grab bars - tub/shower   Additional Comments: has family that can stay with her if needed      Prior Functioning/Environment Prior Level of Function : Independent/Modified Independent             Mobility Comments: uses RW ADLs Comments: indep    OT Problem List: Decreased strength;Decreased range of motion;Decreased activity tolerance;Impaired balance (sitting and/or standing);Decreased coordination;Decreased cognition;Decreased safety awareness;Decreased knowledge of use of DME or AE;Decreased knowledge of precautions;Cardiopulmonary status limiting activity;Obesity;Pain;Increased edema;Impaired UE functional use   OT Treatment/Interventions: Self-care/ADL training;Therapeutic  exercise;Neuromuscular education;Energy conservation;DME and/or AE instruction;Manual therapy;Modalities;Therapeutic activities;Cognitive remediation/compensation;Patient/family education;Balance training      OT Goals(Current goals can be found in the care plan section)  Acute Rehab OT Goals Patient Stated Goal: to get my L leg stronger OT Goal Formulation: With patient Time For Goal Achievement: 01/29/24 Potential to Achieve Goals: Fair ADL Goals Pt Will Perform Lower Body Bathing: with min assist;sit to/from stand;with adaptive equipment Pt Will Perform Lower Body Dressing: with mod assist;with adaptive equipment;sit to/from stand Pt Will Transfer to Toilet: with mod assist;bedside commode;stand pivot transfer Pt Will Perform Toileting - Clothing Manipulation and hygiene: with min assist;sitting/lateral leans   OT Frequency:  Min 2X/week    Co-evaluation PT/OT/SLP Co-Evaluation/Treatment: Yes Reason for Co-Treatment: For patient/therapist safety PT goals addressed during session: Mobility/safety with mobility;Balance;Proper use of DME OT goals addressed during session: ADL's and self-care;Proper use of Adaptive equipment and DME;Strengthening/ROM      AM-PAC OT 6 Clicks Daily Activity     Outcome Measure Help from another person eating meals?: A Little Help from another person taking care of personal grooming?: A Little Help from another person toileting, which includes using toliet, bedpan, or urinal?: Total Help from another person bathing (including washing, rinsing, drying)?: A Lot Help from another person to put on and taking off regular upper body clothing?: A Little Help from another person to put on and taking off regular lower body clothing?: Total 6 Click Score: 13   End of Session Equipment Utilized During Treatment: Gait belt;Other (comment);Rolling walker (2 wheels) (STEDY) Nurse Communication: Mobility status;Other (comment);Need for lift equipment (reported  a pill on floor untaken and STEDY lift needs with +2)  Activity Tolerance: Patient limited by pain Patient left: in bed;with call bell/phone within reach;with bed alarm set  OT Visit Diagnosis: Unsteadiness on feet (R26.81);Muscle weakness (generalized) (M62.81);History of falling (Z91.81);Pain Pain - Right/Left: Left Pain - part of body: Leg;Hip                Time: 8952-8884 OT Time Calculation (min): 28 min Charges:  OT General Charges $OT Visit: 1 Visit OT Evaluation $OT Eval Low Complexity: 1 Low  Xariah Silvernail OT/L Acute Rehabilitation Department  [phone]  01/15/2024, 2:00 PM

## 2024-01-16 DIAGNOSIS — T796XXA Traumatic ischemia of muscle, initial encounter: Secondary | ICD-10-CM | POA: Diagnosis not present

## 2024-01-16 LAB — GLUCOSE, CAPILLARY
Glucose-Capillary: 108 mg/dL — ABNORMAL HIGH (ref 70–99)
Glucose-Capillary: 120 mg/dL — ABNORMAL HIGH (ref 70–99)
Glucose-Capillary: 125 mg/dL — ABNORMAL HIGH (ref 70–99)
Glucose-Capillary: 113 mg/dL — ABNORMAL HIGH (ref 70–99)

## 2024-01-16 LAB — CBC
HCT: 28.3 % — ABNORMAL LOW (ref 36.0–46.0)
Hemoglobin: 9.3 g/dL — ABNORMAL LOW (ref 12.0–15.0)
MCH: 30.6 pg (ref 26.0–34.0)
MCHC: 32.9 g/dL (ref 30.0–36.0)
MCV: 93.1 fL (ref 80.0–100.0)
Platelets: 175 K/uL (ref 150–400)
RBC: 3.04 MIL/uL — ABNORMAL LOW (ref 3.87–5.11)
RDW: 15 % (ref 11.5–15.5)
WBC: 7.7 K/uL (ref 4.0–10.5)
nRBC: 0 % (ref 0.0–0.2)

## 2024-01-16 LAB — COMPREHENSIVE METABOLIC PANEL WITH GFR
ALT: 94 U/L — ABNORMAL HIGH (ref 0–44)
AST: 238 U/L — ABNORMAL HIGH (ref 15–41)
Albumin: 2.9 g/dL — ABNORMAL LOW (ref 3.5–5.0)
Alkaline Phosphatase: 67 U/L (ref 38–126)
Anion gap: 11 (ref 5–15)
BUN: 32 mg/dL — ABNORMAL HIGH (ref 8–23)
CO2: 22 mmol/L (ref 22–32)
Calcium: 7.9 mg/dL — ABNORMAL LOW (ref 8.9–10.3)
Chloride: 99 mmol/L (ref 98–111)
Creatinine, Ser: 4.23 mg/dL — ABNORMAL HIGH (ref 0.44–1.00)
GFR, Estimated: 10 mL/min — ABNORMAL LOW (ref >60)
Glucose, Bld: 109 mg/dL — ABNORMAL HIGH (ref 70–99)
Potassium: 3.2 mmol/L — ABNORMAL LOW (ref 3.5–5.1)
Sodium: 131 mmol/L — ABNORMAL LOW (ref 135–145)
Total Bilirubin: 0.3 mg/dL (ref 0.0–1.2)
Total Protein: 5.1 g/dL — ABNORMAL LOW (ref 6.5–8.1)

## 2024-01-16 LAB — CK: Total CK: 10342 U/L — ABNORMAL HIGH (ref 38–234)

## 2024-01-16 MED ORDER — CHLORHEXIDINE GLUCONATE CLOTH 2 % EX PADS
6.0000 | MEDICATED_PAD | Freq: Every day | CUTANEOUS | Status: DC
  Administered 2024-01-16 – 2024-01-20 (×5): 6 via TOPICAL

## 2024-01-16 MED ORDER — POLYETHYLENE GLYCOL 3350 17 G PO PACK
17.0000 g | PACK | Freq: Every day | ORAL | Status: DC
  Administered 2024-01-16 – 2024-01-19 (×4): 17 g via ORAL
  Filled 2024-01-16 (×4): qty 1

## 2024-01-16 MED ORDER — POTASSIUM CHLORIDE CRYS ER 20 MEQ PO TBCR
40.0000 meq | EXTENDED_RELEASE_TABLET | Freq: Once | ORAL | Status: AC
  Administered 2024-01-16: 40 meq via ORAL
  Filled 2024-01-16: qty 2

## 2024-01-16 MED ORDER — SODIUM CHLORIDE 0.9 % IV SOLN: INTRAVENOUS | Status: DC

## 2024-01-16 NOTE — Progress Notes (Addendum)
 [facility] Progress Note  Subjective:  Creat up to 4.2 UOP 1.2 L   Vitals:   01/16/24 0533 01/16/24 0823 01/16/24 0823 01/16/24 1229  BP:  118/68 118/68 120/62  Pulse: 85 77 79 79  Resp:    16  Temp:    98.7 F (37.1 C)  TempSrc:      SpO2: 93% 93% 94% 96%  Weight:      Height:        Exam: Gen alert, no distress, Wall O2, pleasant  Sclera anicteric, throat clear  No jvd or bruits Chest clear bilat to bases RRR no MRG Abd soft ntnd no mass or ascites +bs Ext mild 1+ RLE edema, no LLE Neuro is alert, Ox 3 , nf     Home bp meds: Norvasc  10 every day Losartan  100mg  qam Toprol  xl 200mg  every day     Date                             Creat               eGFR (ml/min) 2014- feb 2024            0.70- 0.95 May 2024                    1.03 June 2023                    0.97 12/12/23                      1.00 01/14/24                      2.44                 On admit last night 01/15/24                      2.75                 Day of consult     CPK > 20,000 UA 11/17 - large Hb, prot 30, 0-5 rbc/wbc UNa, UCr pend Renal US : 10.5/10.1 cm kidneys, no hydronephrosis CXR: mild CM, no edema/ vasc congestion   Assessment/ Plan: AKI: b/l creatinine is 1.0 from Oct 2025, eGFR > 60 ml/min. Creatinine is 2.4 on admit in the setting of a fall at home, and lying on the floor for sometime passed out. She rec'd IVF bolus and 150cc/hr in ED, then O2 sats dropped to 88% and [place] O2 was needed so IVFs were stopped. AKI likely due to acute rhabdomyolysis + ARB effects.  ARB is on hold. She is making urine, but creat up mid 4's today. No signs of vol overload - L thigh edema is local and likely due to muscle injury. No signs of uremia. Will lower IVFs to 75 cc/hr and dc lasix.  Recommend transfer to [facility] in case needs dialysis, have d/w pmd.  Acute rhabdomyolysis: consequence of passing out and lying on a hard floor (muscle crush injury). CK down to 10K today.     Myer Fret MD  [facility] 01/16/2024, 2:16 PM  Recent Labs  Lab 01/14/24 2006 01/15/24 0435 01/15/24 1527 01/16/24 0615  HGB 13.1 10.2*  --  9.3*  ALBUMIN 3.8  --   --  2.9*  CALCIUM  9.1 8.1* 8.0* 7.9*  PHOS  --  5.5*  --   --   CREATININE 2.44* 2.75* 3.25* 4.23*  K 3.7 3.2* 3.8 3.2*   No results for input(s): IRON, TIBC, FERRITIN in the last 168 hours. Inpatient medications:  Chlorhexidine  Gluconate Cloth  6 each Topical Daily   heparin  5,000 Units Subcutaneous Q8H   insulin  aspart  0-9 Units Subcutaneous TID WC   metoprolol   200 mg Oral Daily   pantoprazole   40 mg Oral Daily   polyethylene glycol  17 g Oral Daily   QUEtiapine   600 mg Oral QHS   sodium chloride  flush  3 mL Intravenous Q12H    acetaminophen  **OR** acetaminophen , senna-docusate

## 2024-01-16 NOTE — TOC Progression Note (Addendum)
 Transition of Care [facility]) - Progression Note    Patient Details  Name: Tammy Mullins MRN: [id number] Date of Birth: 12-16-1950  Transition of Care [facility]) CM/SW Contact  Heather DELENA Saltness, LCSW Phone Number: 01/16/2024, 1:14 PM  Clinical Narrative:    Per attending provider, pt will transfer to [facility] for acute renal failure, accepting provider is Dr. Cherlyn. Awaiting bed to become available. TOC will continue to follow.   Expected Discharge Plan and Services  Transfer to [name]   Social Drivers of Health (SDOH) Interventions SDOH Screenings   Food Insecurity: No Food Insecurity (01/15/2024)  Housing: Low Risk  (01/15/2024)  Transportation Needs: No Transportation Needs (01/15/2024)  Utilities: Not At Risk (01/15/2024)  Alcohol Screen: Low Risk  (07/11/2022)  Physical Activity: Inactive (07/11/2022)  Social Connections: Unknown (01/15/2024)  Tobacco Use: Low Risk  (01/15/2024)    Readmission Risk Interventions    01/16/2024    1:14 PM  Readmission Risk Prevention Plan  Transportation Screening Complete  PCP or Specialist Appt within 5-7 Days Complete  Home Care Screening Complete  Medication Review (RN CM) Complete    Signed: Heather Saltness, MSW, LCSW Clinical Social Worker Inpatient Care Management 01/16/2024 1:14 PM

## 2024-01-16 NOTE — Progress Notes (Signed)
 PROGRESS NOTE Tammy Mullins  FMW:[id number] DOB: [birth year] DOA: 01/14/2024 PCP: Wonda Worth SQUIBB, PA  Brief Narrative/Hospital Course: Tammy Mullins is a 73 y.o. female with PMH of hypertension, hyperlipidemia, bipolar disorder, GAD presenting to the ED after a fall. Reportedly she fell last night after slipping on water hitting left side including her hips, and she believes she passed out after the fall and woke up in the morning, was able to walk and went to the bathroom at that time and having pain in her left side. In the ED vitals stable labs shows renal failure with rhabdomyolysis CT head and CT cervical spine without any acute findings. it does show some progressive moderate foraminal narrowing bilaterally at C6/7 which appears to be a chronic finding. X-ray of left knee and left tib-fib without any acute findings. Patient was admitted for further management. Nephrology consulted  Subjective: Seen and examined today Resting comfortably.  No specific complaints no nausea tremors vomiting Overnight afebrile vitals stable 1-110 systolic, on room air  Labs reviewed sodium 131 creatinine further bumped   2.4>2.7> 3.2> 4.3, [place] to 10[address]  Assessment and plan:  Traumatic rhabdomyolysis Fall at home: CT head CT spine x-ray of the left knee left tibia without acute fracture. Continue aggressive IV fluid hydration for rhabdomyolysis and trend CK which is improving nicely.  Continue supportive care PT OT fall precaution  Recent Labs  Lab 01/14/24 2006 01/16/24 0615  CKTOTAL >20,000* 10,342*   Acute renal failure Hypokalemia Hyperphosphatemia: In the setting of rhabdomyolysis.  Creatinine further worsening -continue to adjust diuretics/IVF as per nephrology, Bump in creatinine noticed this morning and I have notified nephrology> stopping Lasix and IV fluids this morning, requesting to transfer to [name] for impending dialysis. Monitor intake output,renal function and  avoid  nephrotoxic medication hypotension dehydration and renally dose meds. Renal US - no hydro .  Replace electrolytes. Patient is in agreement with transfer to [name] signed out to Dr. Cherlyn. Recent Labs    06/12/23 1543 12/12/23 1535 01/14/24 2006 01/15/24 0435 01/15/24 1527 01/16/24 0615  BUN 17 12 22 23  27* 32*  CREATININE 0.97 1.00 2.44* 2.75* 3.25* 4.23*  CO2 28 31 25 25 23 22   K 4.7 4.5 3.7 3.2* 3.8 3.2*   Intake/Output Summary (Last 24 hours) at 01/16/2024 1126 Last data filed at 01/16/2024 1000 Gross per 24 hour  Intake 2049.15 ml  Output 1250 ml  Net 799.15 ml    Reported hypoxia during sleep this morning: Chest x-rary RLL opacity, which may reflecta airspace disease vs atelectasis. Cont neb prn and IS  Hyperlipidemia Holding statin.  Essential hypertension BP soft and stable will continue to hold amlodipine  continue metoprolol  only.  Diabetes type 2, controlled Controlled, cont  sliding scale insulin . Recent Labs  Lab 01/15/24 0435 01/15/24 0755 01/15/24 1155 01/15/24 1653 01/15/24 2022 01/16/24 0735  GLUCAP  --  101* 94 95 118* 108*  HGBA1C 6.3*  --   --   --   --   --    Bipolar 2 disorder GAD: Mood stable, continue home meds   Mobility: PT Orders: Active  PT Follow up Rec: Skilled Nursing-Short Term Rehab (<3 Hours/Day)01/15/2024 1256   DVT prophylaxis: heparin injection 5,000 Units Start: 01/15/24 0600 Code Status:   Code Status: Full Code Family Communication: plan of care discussed with patient at bedside. Patient status is: Remains hospitalized because of severity of illness Level of care: Telemetry   Dispo: The patient is from: home alone  Anticipated disposition: TBD Objective: Vitals last 24 hrs: Vitals:   01/16/24 0533 01/16/24 0533 01/16/24 0823 01/16/24 0823  BP: (!) 108/57  118/68 118/68  Pulse: 85 85 77 79  Resp: 18     Temp: (!) 97.5 F (36.4 C)     TempSrc: Oral     SpO2: 92% 93% 93% 94%  Weight:       Height:        Physical Examination: General exam: alert awake oriented HEENT:Oral mucosa moist, Ear/Nose WNL grossly Respiratory system: Bilaterally clear BS,no use of accessory muscle Cardiovascular system: S1 & S2 +, No JVD. Gastrointestinal system: Abdomen soft,NT,ND, BS+ Nervous System: Alert, awake, moving all extremities,and following commands. Extremities: extremities warm, leg edema  Skin: Warm, no rashes MSK: Normal muscle bulk,tone, power   Medications reviewed:  Scheduled Meds:  Chlorhexidine  Gluconate Cloth  6 each Topical Daily   heparin  5,000 Units Subcutaneous Q8H   insulin  aspart  0-9 Units Subcutaneous TID WC   metoprolol   200 mg Oral Daily   pantoprazole   40 mg Oral Daily   QUEtiapine   600 mg Oral QHS   sodium chloride  flush  3 mL Intravenous Q12H   Continuous Infusions:   Diet: Diet Order             Diet Heart Room service appropriate? Yes; Fluid consistency: Thin  Diet effective now                   Data Reviewed: I have personally reviewed following labs and imaging studies ( see epic result tab) CBC: Recent Labs  Lab 01/14/24 2006 01/15/24 0435 01/16/24 0615  WBC 8.9 7.6 7.7  NEUTROABS 7.0  --   --   HGB 13.1 10.2* 9.3*  HCT 40.5 30.9* 28.3*  MCV 93.3 93.9 93.1  PLT 251 190 175   CMP: Recent Labs  Lab 01/14/24 2006 01/15/24 0435 01/15/24 1527 01/16/24 0615  NA 138 135 132* 131*  K 3.7 3.2* 3.8 3.2*  CL 100 99 99 99  CO2 25 25 23 22   GLUCOSE 142* 128* 113* 109*  BUN 22 23 27* 32*  CREATININE 2.44* 2.75* 3.25* 4.23*  CALCIUM  9.1 8.1* 8.0* 7.9*  MG  --   --  1.3*  --   PHOS  --  5.5*  --   --    GFR: Estimated Creatinine Clearance: 12.7 mL/min (A) (by C-G formula based on SCr of 4.23 mg/dL (H)). Recent Labs  Lab 01/14/24 2006 01/16/24 0615  AST 829* 238*  ALT 171* 94*  ALKPHOS 89 67  BILITOT 0.4 0.3  PROT 6.8 5.1*  ALBUMIN 3.8 2.9*   No results for input(s): LIPASE, AMYLASE in the last 168 hours. No results  for input(s): AMMONIA in the last 168 hours. Coagulation Profile: No results for input(s): INR, PROTIME in the last 168 hours. Unresulted Labs (From admission, onward)     Start     Ordered   01/16/24 0500  Comprehensive metabolic panel with GFR  Daily,   R      01/15/24 0737   01/16/24 0500  CBC  Daily,   R      01/15/24 0737   01/16/24 0500  CK  Daily,   R      01/15/24 0918   01/15/24 1400  Basic metabolic panel  Now then every 24 hours,   R (with TIMED occurrences)      01/15/24 9082  Antimicrobials/Microbiology: Anti-infectives (From admission, onward)    None         Component Value Date/Time   SDES URINE, CLEAN CATCH 05/14/2015 1213   SPECREQUEST Normal 05/14/2015 1213   CULT MULTIPLE SPECIES PRESENT, SUGGEST RECOLLECTION 05/14/2015 1213   REPTSTATUS 05/16/2015 FINAL 05/14/2015 1213    Procedures:  Mennie LAMY, MD [facility] 01/16/2024, 11:26 AM

## 2024-01-16 NOTE — Progress Notes (Signed)
 Report is given to Loren in [address] at [place] [employer]

## 2024-01-16 NOTE — Plan of Care (Addendum)
 Attempt to place patient on telemetry. Telemetry awake we have no other boxes and we are trying to obtain one to replace due to issue with current box. Charge nurse aware on nightshift.    Problem: Education: Goal: Ability to describe self-care measures that may prevent or decrease complications (Diabetes Survival Skills Education) will improve Outcome: Progressing   Problem: Skin Integrity: Goal: Risk for impaired skin integrity will decrease Outcome: Progressing

## 2024-01-17 ENCOUNTER — Inpatient Hospital Stay (HOSPITAL_COMMUNITY)

## 2024-01-17 DIAGNOSIS — T796XXA Traumatic ischemia of muscle, initial encounter: Secondary | ICD-10-CM | POA: Diagnosis not present

## 2024-01-17 DIAGNOSIS — R7401 Elevation of levels of liver transaminase levels: Secondary | ICD-10-CM | POA: Diagnosis not present

## 2024-01-17 DIAGNOSIS — N179 Acute kidney failure, unspecified: Secondary | ICD-10-CM | POA: Diagnosis not present

## 2024-01-17 DIAGNOSIS — R55 Syncope and collapse: Secondary | ICD-10-CM

## 2024-01-17 LAB — GLUCOSE, CAPILLARY
Glucose-Capillary: 96 mg/dL (ref 70–99)
Glucose-Capillary: 164 mg/dL — ABNORMAL HIGH (ref 70–99)
Glucose-Capillary: 117 mg/dL — ABNORMAL HIGH (ref 70–99)
Glucose-Capillary: 130 mg/dL — ABNORMAL HIGH (ref 70–99)

## 2024-01-17 LAB — COMPREHENSIVE METABOLIC PANEL WITH GFR
ALT: 70 U/L — ABNORMAL HIGH (ref 0–44)
AST: 109 U/L — ABNORMAL HIGH (ref 15–41)
Albumin: 2.2 g/dL — ABNORMAL LOW (ref 3.5–5.0)
Alkaline Phosphatase: 51 U/L (ref 38–126)
Anion gap: 13 (ref 5–15)
BUN: 39 mg/dL — ABNORMAL HIGH (ref 8–23)
CO2: 19 mmol/L — ABNORMAL LOW (ref 22–32)
Calcium: 8.3 mg/dL — ABNORMAL LOW (ref 8.9–10.3)
Chloride: 100 mmol/L (ref 98–111)
Creatinine, Ser: 5.31 mg/dL — ABNORMAL HIGH (ref 0.44–1.00)
GFR, Estimated: 8 mL/min — ABNORMAL LOW (ref >60)
Glucose, Bld: 101 mg/dL — ABNORMAL HIGH (ref 70–99)
Potassium: 3.4 mmol/L — ABNORMAL LOW (ref 3.5–5.1)
Sodium: 132 mmol/L — ABNORMAL LOW (ref 135–145)
Total Bilirubin: 0.6 mg/dL (ref 0.0–1.2)
Total Protein: 4.9 g/dL — ABNORMAL LOW (ref 6.5–8.1)

## 2024-01-17 LAB — CBC
HCT: 28.2 % — ABNORMAL LOW (ref 36.0–46.0)
Hemoglobin: 9.4 g/dL — ABNORMAL LOW (ref 12.0–15.0)
MCH: 30.5 pg (ref 26.0–34.0)
MCHC: 33.3 g/dL (ref 30.0–36.0)
MCV: 91.6 fL (ref 80.0–100.0)
Platelets: 173 K/uL (ref 150–400)
RBC: 3.08 MIL/uL — ABNORMAL LOW (ref 3.87–5.11)
RDW: 14.9 % (ref 11.5–15.5)
WBC: 7.3 K/uL (ref 4.0–10.5)
nRBC: 0 % (ref 0.0–0.2)

## 2024-01-17 LAB — ECHOCARDIOGRAM COMPLETE
Area-P 1/2: 4.15 cm2
Height: 65.5 in
S' Lateral: 2.1 cm
Weight: 3142.88 [oz_av]

## 2024-01-17 LAB — CK: Total CK: 7898 U/L — ABNORMAL HIGH (ref 38–234)

## 2024-01-17 MED ORDER — SENNOSIDES-DOCUSATE SODIUM 8.6-50 MG PO TABS
2.0000 | ORAL_TABLET | Freq: Two times a day (BID) | ORAL | Status: DC
  Administered 2024-01-18 – 2024-01-20 (×5): 2 via ORAL
  Filled 2024-01-17 (×6): qty 2

## 2024-01-17 MED ORDER — METHOCARBAMOL 500 MG PO TABS
500.0000 mg | ORAL_TABLET | Freq: Four times a day (QID) | ORAL | Status: DC | PRN
Start: 2024-01-17 — End: 2024-01-22
  Administered 2024-01-17 – 2024-01-22 (×9): 500 mg via ORAL
  Filled 2024-01-17 (×9): qty 1

## 2024-01-17 MED ORDER — GABAPENTIN 300 MG PO CAPS
300.0000 mg | ORAL_CAPSULE | Freq: Every day | ORAL | Status: DC
  Administered 2024-01-17 – 2024-01-21 (×5): 300 mg via ORAL
  Filled 2024-01-17 (×5): qty 1

## 2024-01-17 MED ORDER — LACTATED RINGERS IV SOLN: INTRAVENOUS | Status: DC

## 2024-01-17 MED ORDER — BUPROPION HCL ER (XL) 150 MG PO TB24
300.0000 mg | ORAL_TABLET | Freq: Every day | ORAL | Status: DC
  Administered 2024-01-17 – 2024-01-22 (×6): 300 mg via ORAL
  Filled 2024-01-17 (×6): qty 2

## 2024-01-17 NOTE — Consult Note (Signed)
/  This remote consultation was conducted using EMR wound digital images downloaded by the staff member. Pertinent information was reviewed in order to determine recommendations. Coordinated care of patient with primary nurse via secure chat.  WOC Nurse Consult Note: Reason for Consult: leg wound Wound type: blistering; trauma Measurement: wide area of involvement Wound azi:izwlizi in some sites, unroofed blistering Drainage (amount, consistency, odor)  see flowsheet Periwound: appearing as high moisture, erythema Dressing procedure/placement/frequency:  Cleanse wound with normal saline, pat dry with guaze and apply 2 layers of Xeroform yellow guaze over wound, cover with 4x4 Mepilex foam dressing or if foam is not optimal can use rolled Kerlix gauze, secure in place with tape. Off-load heels onto pillows at all times. BID  Nursing staff to assess wound every shift. Leg 01/17/24   WOC will not follow and will remove patient from census task list. Please reconsult if wound worsens in condition and notify provider.   Sherrilyn Hals MSN RN CWOCN WOC [facility]  [phone] (Available from 7-3 pm Mon-Friday)

## 2024-01-17 NOTE — Progress Notes (Signed)
 Nephrology Follow-Up Consult note   Assessment/Recommendations: Tammy Mullins is a/an 73 y.o. female with a past medical history significant for hypertension, hyperlipidemia, GAD, admitted for rhabdomyolysis.       Severe nonoliguric AKI: Secondary to rhabdomyolysis.  Urine output is improving.  Creatinine still rising however BUN not elevated and no uremic symptoms - Continue IV fluids.  CK downtrending -Continue to monitor daily Cr, Dose meds for GFR -Monitor Daily I/Os, Daily weight  -Maintain MAP>65 for optimal renal perfusion.  -Avoid nephrotoxic medications including NSAIDs -Use synthetic opioids (Fentanyl /Dilaudid ) if needed -Currently no indication for HD  Hyponatremia: Mild likely associated with free water excess in the setting of AKI.  Continue to monitor  Hypokalemia: Mild decrease.  Replace as needed.  Metabolic acidosis: Mild with level of 19.  Will switch fluids to lactated Ringer 's  Essential hypertension: On blood pressure medications at home.  Restart them as needed   Recommendations conveyed to primary service.    [facility] 01/17/2024 1:44 PM  ___________________________________________________________  CC: Fall  Interval History/Subjective: Patient states she feels well today.  Denies shortness of breath or chest pain.  Good urine output over the past 24 hours.   Medications:  Current Facility-Administered Medications  Medication Dose Route Frequency Provider Last Rate Last Admin   0.9 %  sodium chloride  infusion   Intravenous Continuous Geralynn Charleston, MD 75 mL/hr at 01/16/24 2126 New Bag at 01/16/24 2126   acetaminophen  (TYLENOL ) tablet 650 mg  650 mg Oral Q6H PRN Khan, Ghalib, MD   650 mg at 01/16/24 1728   Or   acetaminophen  (TYLENOL ) suppository 650 mg  650 mg Rectal Q6H PRN Fernand Prost, MD       Chlorhexidine  Gluconate Cloth 2 % PADS 6 each  6 each Topical Daily Christobal Guadalajara, MD   6 each at 01/17/24 1005    heparin  injection 5,000 Units  5,000 Units Subcutaneous Q8H Khan, Ghalib, MD   5,000 Units at 01/17/24 1253   insulin  aspart (novoLOG ) injection 0-9 Units  0-9 Units Subcutaneous TID WC Christobal Guadalajara, MD   2 Units at 01/17/24 1252   metoprolol  succinate (TOPROL -XL) 24 hr tablet 200 mg  200 mg Oral Daily Khan, Ghalib, MD   200 mg at 01/17/24 1002   pantoprazole  (PROTONIX ) EC tablet 40 mg  40 mg Oral Daily Khan, Ghalib, MD   40 mg at 01/17/24 1003   polyethylene glycol (MIRALAX  / GLYCOLAX ) packet 17 g  17 g Oral Daily Kc, Guadalajara, MD   17 g at 01/17/24 1003   QUEtiapine  (SEROQUEL ) tablet 600 mg  600 mg Oral QHS Khan, Ghalib, MD   600 mg at 01/16/24 2219   senna-docusate (Senokot-S) tablet 1 tablet  1 tablet Oral QHS PRN Khan, Ghalib, MD   1 tablet at 01/15/24 2008   sodium chloride  flush (NS) 0.9 % injection 3 mL  3 mL Intravenous Q12H Khan, Ghalib, MD   3 mL at 01/16/24 2219      Review of Systems: 10 systems reviewed and negative except per interval history/subjective  Physical Exam: Vitals:   01/17/24 0419 01/17/24 0700  BP: 128/68 (!) 154/74  Pulse: 75 79  Resp: 15 16  Temp: 98.5 F (36.9 C) 98.1 F (36.7 C)  SpO2: 95%    Total I/O In: -  Out: 1050 [Urine:1050]  Intake/Output Summary (Last 24 hours) at 01/17/2024 1344 Last data filed at 01/17/2024 1100 Gross per 24 hour  Intake 1478.21 ml  Output 2700 ml  Net -1221.79 ml   Constitutional: well-appearing, no acute distress ENMT: ears and nose without scars or lesions, MMM CV: normal rate, no edema Respiratory: Bilateral chest rise, normal work of breathing Gastrointestinal: soft, non-tender, no palpable masses or hernias Skin: no visible lesions or rashes Psych: alert, appropriate mood and affect   Test Results I personally reviewed new and old clinical labs and radiology tests Lab Results  Component Value Date   NA 132 (L) 01/17/2024   K 3.4 (L) 01/17/2024   CL 100 01/17/2024   CO2 19 (L) 01/17/2024   BUN 39 (H)  01/17/2024   CREATININE 5.31 (H) 01/17/2024   GFR 85.07 01/02/2014   CALCIUM  8.3 (L) 01/17/2024   ALBUMIN 2.2 (L) 01/17/2024   PHOS 5.5 (H) 01/15/2024    CBC Recent Labs  Lab 01/14/24 2006 01/15/24 0435 01/16/24 0615 01/17/24 0531  WBC 8.9 7.6 7.7 7.3  NEUTROABS 7.0  --   --   --   HGB 13.1 10.2* 9.3* 9.4*  HCT 40.5 30.9* 28.3* 28.2*  MCV 93.3 93.9 93.1 91.6  PLT 251 190 175 173

## 2024-01-17 NOTE — Plan of Care (Signed)
  Problem: Fluid Volume: Goal: Ability to maintain a balanced intake and output will improve Outcome: Progressing   Problem: Skin Integrity: Goal: Risk for impaired skin integrity will decrease Outcome: Progressing   

## 2024-01-17 NOTE — Progress Notes (Signed)
  Echocardiogram 2D Echocardiogram has been performed.  Tinnie FORBES Gosling RDCS 01/17/2024, 9:52 AM
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
## 2024-01-17 NOTE — Care Management Important Message (Signed)
 Important Message  Patient Details  Name: Khia Dieterich MRN: [id number] Date of Birth: 22-Oct-1950   Important Message Given:  Yes - Medicare IM     Jennie Laneta Dragon 01/17/2024, 11:57 AM
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
## 2024-01-17 NOTE — Progress Notes (Signed)
 RE:  Tammy Mullins       Date of Birth:  [birth year]     Date:   01/17/24       To Whom It May Concern:  Please be advised that the above-named patient will require a short-term nursing home stay - anticipated 30 days or less for rehabilitation and strengthening.  The plan is for return home.                 MD signature                Date

## 2024-01-17 NOTE — Progress Notes (Signed)
 [facility] PROGRESS NOTE   Tammy Mullins FMW:[id number] DOB: 07-28-1950 DOA: 01/14/2024  PCP: Wonda Worth SQUIBB, PA  Brief History: 73 y.o. female with PMH of hypertension, hyperlipidemia, bipolar disorder, GAD presenting to the ED after a fall.  Patient had a fall while she was going to the kitchen to get some water.  She thinks she may have had a syncopal episode.  She was on the floor for several hours.  Evaluation in the ED raised concern for rhabdomyolysis.  She was noted to have acute kidney injury.  She was hospitalized for further management.  Consultants: Nephrology  Procedures: None yet    Subjective/Interval History: Patient complains of blister formation in the left lower extremity.  This is new.  She thinks this is related to her fall.  She has never had blistering before.  Also has weakness in the left lower extremity which she is unable to mention if it is new or not.  She does have chronic low back pain.    Assessment/Plan:  Traumatic rhabdomyolysis secondary to fall at home CK level was greater than 20,000.  Patient was aggressively hydrated.  CK levels gradually trending down limits.  Acute kidney injury/hypokalemia/hyperphosphatemia/hyponatremia Most likely secondary to rhabdomyolysis.  She has had good urine output in the last 24 to 36 hours however creatinine continues to climb.  Hopefully this will plateau and start improving soon.  Avoid nephrotoxic agents.  Continue to monitor urine output.  Nephrology is following. Electrolyte management per nephrology.  Concern for syncope Echocardiogram is pending.  Left lower extremity weakness Could be related to trauma.  No obvious injuries noted on x-ray of the hip or knee.  She does have chronic low back pain.  Will proceed with MRI of the lumbar spine.  She has had lumbar surgeries previously.  Blisters left lower extremity Does not appear to be infected.  No previous history of same.  Etiology unclear.   Monitor for now.  Normocytic anemia No evidence for overt bleeding.  Monitor closely.  Transaminitis Secondary to rhabdomyolysis.  Hold statin.  Hyperlipidemia Holding statin due to abnormal LFTs from rhabdomyolysis.  Essential hypertension Monitor blood pressures.  Noted to be on metoprolol .  Amlodipine  is on hold.  Diabetes mellitus type 2, controlled Monitor CBGs. HbA1c 6.3.  Bipolar disorder Stable.  Class I obesity Estimated body mass index is 32.19 kg/m as calculated from the following:   Height as of this encounter: 5' 5.5 (1.664 m).   Weight as of this encounter: 89.1 kg.   DVT Prophylaxis: Subcutaneous heparin Code Status: Full code Family Communication: Discussed with patient Disposition Plan: SNF is recommended     Medications: Scheduled:  Chlorhexidine  Gluconate Cloth  6 each Topical Daily   heparin  5,000 Units Subcutaneous Q8H   insulin  aspart  0-9 Units Subcutaneous TID WC   metoprolol   200 mg Oral Daily   pantoprazole   40 mg Oral Daily   polyethylene glycol  17 g Oral Daily   QUEtiapine   600 mg Oral QHS   sodium chloride  flush  3 mL Intravenous Q12H   Continuous:  sodium chloride  75 mL/hr at 01/16/24 2126   PRN:acetaminophen  **OR** acetaminophen , senna-docusate  Antibiotics: Anti-infectives (From admission, onward)    None       Objective:  Vital Signs  Vitals:   01/16/24 2024 01/17/24 0419 01/17/24 0500 01/17/24 0700  BP: (!) 145/59 128/68  (!) 154/74  Pulse: 77 75  79  Resp: 16 15  16   Temp: 100 F (  37.8 C) 98.5 F (36.9 C)  98.1 F (36.7 C)  TempSrc: Oral Oral  Oral  SpO2: 95% 95%    Weight:   89.1 kg   Height:        Intake/Output Summary (Last 24 hours) at 01/17/2024 1118 Last data filed at 01/17/2024 0900 Gross per 24 hour  Intake 1478.21 ml  Output 2150 ml  Net -671.79 ml   Filed Weights   01/15/24 1447 01/16/24 0500 01/17/24 0500  Weight: 85.2 kg 88.2 kg 89.1 kg    General appearance: Awake alert.  In  no distress Resp: Clear to auscultation bilaterally.  Normal effort Cardio: S1-S2 is normal regular.  No S3-S4.  No rubs murmurs or bruit GI: Abdomen is soft.  Nontender nondistended.  Bowel sounds are present normal.  No masses organomegaly Extremities: No edema.  Limited range of motion of the left lower extremity Skin: Blisters/bullae noted left lower extremity medially. Neurologic: Alert and oriented x3.  No focal neurological deficits.    Lab Results:  Data Reviewed: I have personally reviewed following labs and reports of the imaging studies  CBC: Recent Labs  Lab 01/14/24 2006 01/15/24 0435 01/16/24 0615 01/17/24 0531  WBC 8.9 7.6 7.7 7.3  NEUTROABS 7.0  --   --   --   HGB 13.1 10.2* 9.3* 9.4*  HCT 40.5 30.9* 28.3* 28.2*  MCV 93.3 93.9 93.1 91.6  PLT 251 190 175 173    Basic Metabolic Panel: Recent Labs  Lab 01/14/24 2006 01/15/24 0435 01/15/24 1527 01/16/24 0615 01/17/24 0531  NA 138 135 132* 131* 132*  K 3.7 3.2* 3.8 3.2* 3.4*  CL 100 99 99 99 100  CO2 25 25 23 22  19*  GLUCOSE 142* 128* 113* 109* 101*  BUN 22 23 27* 32* 39*  CREATININE 2.44* 2.75* 3.25* 4.23* 5.31*  CALCIUM  9.1 8.1* 8.0* 7.9* 8.3*  MG  --   --  1.3*  --   --   PHOS  --  5.5*  --   --   --     GFR: Estimated Creatinine Clearance: 10.2 mL/min (A) (by C-G formula based on SCr of 5.31 mg/dL (H)).  Liver Function Tests: Recent Labs  Lab 01/14/24 2006 01/16/24 0615 01/17/24 0531  AST 829* 238* 109*  ALT 171* 94* 70*  ALKPHOS 89 67 51  BILITOT 0.4 0.3 0.6  PROT 6.8 5.1* 4.9*  ALBUMIN 3.8 2.9* 2.2*    Cardiac Enzymes: Recent Labs  Lab 01/14/24 2006 01/16/24 0615 01/17/24 0531  CKTOTAL >20,000* 10,342* 7,898*   HbA1C: Recent Labs    01/15/24 0435  HGBA1C 6.3*    CBG: Recent Labs  Lab 01/16/24 0735 01/16/24 1237 01/16/24 1635 01/16/24 2113 01/17/24 0633  GLUCAP 108* 120* 125* 113* 96     Radiology Studies: No results found.     LOS: 3 days   Zaneta Lightcap  [employer] on [email or web address]  01/17/2024, 11:18 AM

## 2024-01-17 NOTE — Progress Notes (Signed)
 Physical Therapy Treatment Patient Details Name: Tammy Mullins MRN: [id number] DOB: 05-21-1950 Today's Date: 01/17/2024   History of Present Illness Tammy Mullins is a 73 y.o. year old female who presented 11/16 s/p fall hitting L side of head, CT spine, hip and head with no acute findings, dx with Traumatic rhabdomyolysis; placed on 3 L O2 in ED at [facility]. Transferred to [facility] 11/18. PMH:  hypertension, hyperlipidemia, bipolar disorder, ADD, Barrett's esophagus, DM2, fibromyalgia, gout, arthritis, aortic atherosclerosis, IBS, myalgia and myositis    PT Comments  The pt is making gradual functional progress, but remains at high risk for falls, relying on minA for bed mobility, modA to transfer to stand, and maxA to step/stand pivot with HHA. She displays poor motor planning/initiation with extending her legs to power up to stand. She also display L leg apraxia, often abducting her leg when cued to adduct her leg to reach a target. Will continue to follow acutely.    If plan is discharge home, recommend the following: Two people to help with walking and/or transfers;A lot of help with bathing/dressing/bathroom;Assist for transportation;Help with stairs or ramp for entrance;Assistance with cooking/housework;Direct supervision/assist for medications management;Direct supervision/assist for financial management   Can travel by private vehicle     No  Equipment Recommendations  BSC/3in1;Wheelchair (measurements PT);Wheelchair cushion (measurements PT);Hospital bed    Recommendations for Other Services       Precautions / Restrictions Precautions Precautions: Fall Recall of Precautions/Restrictions: Intact Precaution/Restrictions Comments: L LE blisters Restrictions Weight Bearing Restrictions Per Provider Order: No     Mobility  Bed Mobility Overal bed mobility: Needs Assistance Bed Mobility: Supine to Sit, Sit to Supine     Supine to sit: Min assist, HOB elevated Sit to supine: Min  assist, HOB elevated   General bed mobility comments: Cues needed to sequence directing legs off L EOB and ascending trunk to sit up, needing minA at trunk and hips to square hips with EOB. MinA to lift legs back onto bed with return to supine    Transfers Overall transfer level: Needs assistance Equipment used: 1 person hand held assist Transfers: Sit to/from Stand, Bed to chair/wheelchair/BSC Sit to Stand: Mod assist Stand pivot transfers: Max assist Step pivot transfers: Max assist       General transfer comment: Bil knees blocked and cues provided for pt to hold onto therapist anterior to her and pull up to stand, delayed initiation noted through pt's legs, modA to power up to stand and gain balance. MaxA needed for balance and directing pt to step pivot to L bed to commode 1x then stand pivot to R from commode to bed 1x. Poor L leg motor planning noted, needing max cues to direct it and intermittently advance it    Ambulation/Gait               General Gait Details: unable to take more than pivotal steps from bed to commode with maxA and bil HHA today   Stairs             Wheelchair Mobility     Tilt Bed    Modified Rankin (Stroke Patients Only)       Balance Overall balance assessment: Needs assistance Sitting-balance support: No upper extremity supported, Feet supported Sitting balance-Leahy Scale: Fair Sitting balance - Comments: static sitting EOB with CGA for safety Postural control: Posterior lean Standing balance support: Bilateral upper extremity supported, During functional activity Standing balance-Leahy Scale: Poor Standing balance comment: reliant on mod-maxA and bil  UE support                            Communication Communication Communication: No apparent difficulties  Cognition Arousal: Alert Behavior During Therapy: WFL for tasks assessed/performed   PT - Cognitive impairments: Awareness, Sequencing, Problem solving,  Safety/Judgement, Initiation, Attention                       PT - Cognition Comments: Pt needing cues to sequence mobility. Poor initiation through legs to stand, displaying apparent L leg apraxia at times. Following commands: Impaired Following commands impaired: Follows one step commands with increased time, Follows one step commands inconsistently    Cueing Cueing Techniques: Verbal cues, Tactile cues, Visual cues  Exercises General Exercises - Lower Extremity Hip ABduction/ADduction: AROM, Both, 5 reps, Supine (focused on adduction) Other Exercises Other Exercises: supine L hip flexion + adduction to target >3x, but pt often abducting L hip instead    General Comments        Pertinent Vitals/Pain Pain Assessment Pain Assessment: Faces Faces Pain Scale: Hurts little more Pain Location: L LE Pain Descriptors / Indicators: Discomfort, Grimacing, Guarding, Sore Pain Intervention(s): Limited activity within patient's tolerance, Monitored during session, Repositioned    Home Living                          Prior Function            PT Goals (current goals can now be found in the care plan section) Acute Rehab PT Goals Patient Stated Goal: to have a BM PT Goal Formulation: With patient/family Time For Goal Achievement: 01/29/24 Potential to Achieve Goals: Fair Progress towards PT goals: Progressing toward goals    Frequency    Min 2X/week      PT Plan      Co-evaluation              AM-PAC PT 6 Clicks Mobility   Outcome Measure  Help needed turning from your back to your side while in a flat bed without using bedrails?: A Lot Help needed moving from lying on your back to sitting on the side of a flat bed without using bedrails?: A Lot Help needed moving to and from a bed to a chair (including a wheelchair)?: A Lot Help needed standing up from a chair using your arms (e.g., wheelchair or bedside chair)?: A Lot Help needed to walk in  hospital room?: Total Help needed climbing 3-5 steps with a railing? : Total 6 Click Score: 10    End of Session Equipment Utilized During Treatment: Gait belt Activity Tolerance: Patient tolerated treatment well Patient left: in bed;with call bell/phone within reach;with bed alarm set;with family/visitor present Nurse Communication: Mobility status;Need for lift equipment (stedy) PT Visit Diagnosis: Unsteadiness on feet (R26.81);Muscle weakness (generalized) (M62.81);History of falling (Z91.81);Other abnormalities of gait and mobility (R26.89);Difficulty in walking, not elsewhere classified (R26.2)     Time: 8386-8369 PT Time Calculation (min) (ACUTE ONLY): 17 min  Charges:    $Therapeutic Activity: 8-22 mins PT General Charges $$ ACUTE PT VISIT: 1 Visit                     Theo Ferretti, PT, DPT Acute Rehabilitation Services  Office: [phone]    Theo CHRISTELLA Ferretti 01/17/2024, 5:53 PM

## 2024-01-18 DIAGNOSIS — R7401 Elevation of levels of liver transaminase levels: Secondary | ICD-10-CM | POA: Diagnosis not present

## 2024-01-18 DIAGNOSIS — T796XXA Traumatic ischemia of muscle, initial encounter: Secondary | ICD-10-CM | POA: Diagnosis not present

## 2024-01-18 DIAGNOSIS — N179 Acute kidney failure, unspecified: Secondary | ICD-10-CM | POA: Diagnosis not present

## 2024-01-18 LAB — COMPREHENSIVE METABOLIC PANEL WITH GFR
ALT: 64 U/L — ABNORMAL HIGH (ref 0–44)
AST: 84 U/L — ABNORMAL HIGH (ref 15–41)
Albumin: 2 g/dL — ABNORMAL LOW (ref 3.5–5.0)
Alkaline Phosphatase: 54 U/L (ref 38–126)
Anion gap: 14 (ref 5–15)
BUN: 42 mg/dL — ABNORMAL HIGH (ref 8–23)
CO2: 19 mmol/L — ABNORMAL LOW (ref 22–32)
Calcium: 8 mg/dL — ABNORMAL LOW (ref 8.9–10.3)
Chloride: 101 mmol/L (ref 98–111)
Creatinine, Ser: 4.86 mg/dL — ABNORMAL HIGH (ref 0.44–1.00)
GFR, Estimated: 9 mL/min — ABNORMAL LOW (ref >60)
Glucose, Bld: 101 mg/dL — ABNORMAL HIGH (ref 70–99)
Potassium: 3.5 mmol/L (ref 3.5–5.1)
Sodium: 134 mmol/L — ABNORMAL LOW (ref 135–145)
Total Bilirubin: 0.7 mg/dL (ref 0.0–1.2)
Total Protein: 4.9 g/dL — ABNORMAL LOW (ref 6.5–8.1)

## 2024-01-18 LAB — CBC
HCT: 27.7 % — ABNORMAL LOW (ref 36.0–46.0)
Hemoglobin: 9.4 g/dL — ABNORMAL LOW (ref 12.0–15.0)
MCH: 30.6 pg (ref 26.0–34.0)
MCHC: 33.9 g/dL (ref 30.0–36.0)
MCV: 90.2 fL (ref 80.0–100.0)
Platelets: 198 K/uL (ref 150–400)
RBC: 3.07 MIL/uL — ABNORMAL LOW (ref 3.87–5.11)
RDW: 14.9 % (ref 11.5–15.5)
WBC: 9.3 K/uL (ref 4.0–10.5)
nRBC: 0 % (ref 0.0–0.2)

## 2024-01-18 LAB — GLUCOSE, CAPILLARY
Glucose-Capillary: 103 mg/dL — ABNORMAL HIGH (ref 70–99)
Glucose-Capillary: 114 mg/dL — ABNORMAL HIGH (ref 70–99)
Glucose-Capillary: 226 mg/dL — ABNORMAL HIGH (ref 70–99)
Glucose-Capillary: 88 mg/dL (ref 70–99)
Glucose-Capillary: 97 mg/dL (ref 70–99)

## 2024-01-18 LAB — CK: Total CK: 5013 U/L — ABNORMAL HIGH (ref 38–234)

## 2024-01-18 NOTE — Progress Notes (Signed)
 [facility] PROGRESS NOTE   Tammy Mullins FMW:[id number] DOB: 1950-11-28 DOA: 01/14/2024  PCP: Wonda Worth SQUIBB, PA  Brief History: 73 y.o. female with PMH of hypertension, hyperlipidemia, bipolar disorder, GAD presenting to the ED after a fall.  Patient had a fall while she was going to the kitchen to get some water.  She thinks she may have had a syncopal episode.  She was on the floor for several hours.  Evaluation in the ED raised concern for rhabdomyolysis.  She was noted to have acute kidney injury.  She was hospitalized for further management.  Consultants: Nephrology  Procedures: None yet    Subjective/Interval History: Patient mentioned that she is feeling slightly better today.  More stronger.  Denies any chest pain shortness of breath.  No nausea or vomiting.      Assessment/Plan:  Traumatic rhabdomyolysis secondary to fall at home CK level was greater than 20,000.  Patient was aggressively hydrated.  CK levels gradually trending down.  Acute kidney injury/hypokalemia/hyperphosphatemia/hyponatremia Most likely secondary to rhabdomyolysis.  Renal function noted to be better today.  Has had good urine output in the last 3 days.  Hopefully can avoid hemodialysis. Nephrology is following closely.   Electrolyte management per nephrology.  Concern for syncope Echocardiogram shows normal LVEF.  Grade 1 diastolic dysfunction noted.  Right ventricular function was normal.  No significant valvular abnormalities noted.  No arrhythmia on telemetry.  No further workup at this time.  Cardiac monitor can be considered in the outpatient setting.  Left lower extremity weakness Most likely due to rhabdomyolysis.  MRI lumbar spine without acute findings.  Able to move leg better today.  PT and OT.  Blisters left lower extremity Etiology unclear but could be related to trauma.  Stable this morning.  Wound care.    Normocytic anemia No evidence for overt bleeding.  Monitor  closely.  Transaminitis Secondary to rhabdomyolysis.  Hold statin.  LFTs are improving.  Hyperlipidemia Holding statin due to abnormal LFTs from rhabdomyolysis.  Essential hypertension Monitor blood pressures.  Noted to be on metoprolol .  Amlodipine  is on hold.  Diabetes mellitus type 2, controlled Monitor CBGs. HbA1c 6.3.  Bipolar disorder Stable.  Class I obesity Estimated body mass index is 32.19 kg/m as calculated from the following:   Height as of this encounter: 5' 5.5 (1.664 m).   Weight as of this encounter: 89.1 kg.   DVT Prophylaxis: Subcutaneous heparin Code Status: Full code Family Communication: Discussed with patient Disposition Plan: SNF is recommended     Medications: Scheduled:  buPROPion   300 mg Oral Daily   Chlorhexidine  Gluconate Cloth  6 each Topical Daily   gabapentin   300 mg Oral QHS   heparin  5,000 Units Subcutaneous Q8H   insulin  aspart  0-9 Units Subcutaneous TID WC   metoprolol   200 mg Oral Daily   pantoprazole   40 mg Oral Daily   polyethylene glycol  17 g Oral Daily   QUEtiapine   600 mg Oral QHS   senna-docusate  2 tablet Oral BID   sodium chloride  flush  3 mL Intravenous Q12H   Continuous:   PRN:acetaminophen  **OR** acetaminophen , methocarbamol    Objective:  Vital Signs  Vitals:   01/17/24 1415 01/17/24 1946 01/18/24 0351 01/18/24 0746  BP: 119/64 (!) 148/67 130/62 (!) 143/69  Pulse: 77 87 82 82  Resp: 16  18 17   Temp: 98.2 F (36.8 C) 98 F (36.7 C)  98 F (36.7 C)  TempSrc: Oral  SpO2: 95% 93% 97% 95%  Weight:      Height:        Intake/Output Summary (Last 24 hours) at 01/18/2024 0930 Last data filed at 01/18/2024 9360 Gross per 24 hour  Intake 1259.69 ml  Output 1550 ml  Net -290.31 ml   Filed Weights   01/15/24 1447 01/16/24 0500 01/17/24 0500  Weight: 85.2 kg 88.2 kg 89.1 kg    General appearance: Awake alert.  In no distress Resp: Clear to auscultation bilaterally.  Normal effort Cardio:  S1-S2 is normal regular.  No S3-S4.  No rubs murmurs or bruit GI: Abdomen is soft.  Nontender nondistended.  Bowel sounds are present normal.  No masses organomegaly Extremities: Improved mobility of the left lower extremity. Blisters are stable.  No new lesions noted. No focal neurological deficits.  Lab Results:  Data Reviewed: I have personally reviewed following labs and reports of the imaging studies  CBC: Recent Labs  Lab 01/14/24 2006 01/15/24 0435 01/16/24 0615 01/17/24 0531 01/18/24 0520  WBC 8.9 7.6 7.7 7.3 9.3  NEUTROABS 7.0  --   --   --   --   HGB 13.1 10.2* 9.3* 9.4* 9.4*  HCT 40.5 30.9* 28.3* 28.2* 27.7*  MCV 93.3 93.9 93.1 91.6 90.2  PLT 251 190 175 173 198    Basic Metabolic Panel: Recent Labs  Lab 01/15/24 0435 01/15/24 1527 01/16/24 0615 01/17/24 0531 01/18/24 0520  NA 135 132* 131* 132* 134*  K 3.2* 3.8 3.2* 3.4* 3.5  CL 99 99 99 100 101  CO2 25 23 22  19* 19*  GLUCOSE 128* 113* 109* 101* 101*  BUN 23 27* 32* 39* 42*  CREATININE 2.75* 3.25* 4.23* 5.31* 4.86*  CALCIUM  8.1* 8.0* 7.9* 8.3* 8.0*  MG  --  1.3*  --   --   --   PHOS 5.5*  --   --   --   --     GFR: Estimated Creatinine Clearance: 11.1 mL/min (A) (by C-G formula based on SCr of 4.86 mg/dL (H)).  Liver Function Tests: Recent Labs  Lab 01/14/24 2006 01/16/24 0615 01/17/24 0531 01/18/24 0520  AST 829* 238* 109* 84*  ALT 171* 94* 70* 64*  ALKPHOS 89 67 51 54  BILITOT 0.4 0.3 0.6 0.7  PROT 6.8 5.1* 4.9* 4.9*  ALBUMIN 3.8 2.9* 2.2* 2.0*    Cardiac Enzymes: Recent Labs  Lab 01/14/24 2006 01/16/24 0615 01/17/24 0531 01/18/24 0520  CKTOTAL >20,000* 10,342* 7,898* 5,013*    CBG: Recent Labs  Lab 01/17/24 1136 01/17/24 1607 01/17/24 2108 01/18/24 0613 01/18/24 0830  GLUCAP 164* 117* 130* 103* 114*     Radiology Studies: MR LUMBAR SPINE WO CONTRAST Result Date: 01/17/2024 EXAM: MRI LUMBAR SPINE 01/17/2024 03:25:00 PM TECHNIQUE: Multiplanar multisequence MRI of  the lumbar spine was performed without the administration of intravenous contrast. COMPARISON: MR Lumbar Spine Without IV Contrast 01/24/2018. CT lumbar spine 03/03/2022. CLINICAL HISTORY: Low back pain, trauma. FINDINGS: BONES AND ALIGNMENT: Posterior instrumented fusion spanning L3 to S1 with bilateral pedicle screws and vertical interconnecting rods. Interbody spacers are noted at the L3-L4 through L5-S1 levels. Postsurgical changes are present within the midline paraspinal soft tissues of the lower lumbar spine. Lumbar lordosis is maintained. Normal vertebral body heights. Bone marrow signal is mildly heterogeneous. There is no bone marrow edema or evidence of acute fracture. Mild degenerative endplate changes are present at L2-L3. SPINAL CORD: The conus medullaris extends to the L2 level. SOFT TISSUES: Postsurgical changes are present  within the midline paraspinal soft tissues of the lower lumbar spine. Mild atrophy of the paraspinal musculature. T12-L1: No significant spinal canal or foraminal stenosis. L1-L2: There is a small disc bulge and mild facet arthrosis. No significant spinal canal or foraminal stenosis. L2-L3: There is mild disc height loss and disc desiccation. A diffuse disc bulge indents the ventral thecal sac and contributes to lateral recess narrowing. Moderate facet arthrosis with bilateral facet effusions. Thickening of the ligamentum flavum. Moderate spinal canal stenosis with crowding of the cauda equina roots. There is mild bilateral foraminal stenosis. The disc bulge and facet osteophytes possibly contact the exiting right L2 nerve root. L3-L4: Postsurgical changes are present. No significant residual spinal canal stenosis. Small right subarticular disc protrusion. No significant foraminal stenosis. L4-L5: Postsurgical changes are present. No significant residual spinal canal stenosis. No significant foraminal stenosis. L5-S1: Postsurgical changes are present. Small disc bulge. No  significant spinal canal stenosis. There is moderate foraminal stenosis on the right. IMPRESSION: 1. No acute findings. 2. Posterior instrumented fusion spanning L3 to S1. No significant residual spinal canal stenosis at operative levels. 3. Moderate spinal canal stenosis at L2-3 with crowding of the cauda equina roots and mild bilateral foraminal stenosis. Disc bulge and facet osteophytes may contact the exiting right L2 nerve root. Findings suggestive of adjacent segment disease. 4. Additional degenerative changes as above. Moderate right foraminal stenosis at L5-S1. Electronically signed by: Donnice Mania MD 01/17/2024 04:33 PM EST RP Workstation: [id number]   ECHOCARDIOGRAM COMPLETE Result Date: 01/17/2024    ECHOCARDIOGRAM REPORT   Patient Name:   Tammy Mullins Date of Exam: 01/17/2024 Medical Rec #:  [id number]      Height:       65.5 in Accession #:    [id number]     Weight:       196.4 lb Date of Birth:  02/18/1951      BSA:          1.974 m Patient Age:    73 years       BP:           128/68 mmHg Patient Gender: F              HR:           84 bpm. Exam Location:  Inpatient Procedure: 2D Echo, Cardiac Doppler and Color Doppler (Both Spectral and Color            Flow Doppler were utilized during procedure). Indications:    Syncope R55  History:        Patient has no prior history of Echocardiogram examinations.                 Risk Factors:Diabetes.  Sonographer:    Tinnie Gosling RDCS Referring Phys: [id number] RAMESH KC IMPRESSIONS  1. Left ventricular ejection fraction, by estimation, is 60 to 65%. The left ventricle has normal function. The left ventricle has no regional wall motion abnormalities. There is mild concentric left ventricular hypertrophy. Left ventricular diastolic parameters are consistent with Grade I diastolic dysfunction (impaired relaxation).  2. Right ventricular systolic function is normal. The right ventricular size is normal.  3. Left atrial size was mildly dilated.  4. The mitral  valve is normal in structure. Trivial mitral valve regurgitation. No evidence of mitral stenosis.  5. The aortic valve is tricuspid. There is mild calcification of the aortic valve. Aortic valve regurgitation is not visualized. Aortic valve sclerosis/calcification is present, without any evidence of aortic stenosis.  6. The inferior vena cava is normal in size with greater than 50% respiratory variability, suggesting right atrial pressure of 3 mmHg. FINDINGS  Left Ventricle: Left ventricular ejection fraction, by estimation, is 60 to 65%. The left ventricle has normal function. The left ventricle has no regional wall motion abnormalities. The left ventricular internal cavity size was normal in size. There is  mild concentric left ventricular hypertrophy. Left ventricular diastolic parameters are consistent with Grade I diastolic dysfunction (impaired relaxation). Right Ventricle: The right ventricular size is normal. No increase in right ventricular wall thickness. Right ventricular systolic function is normal. Left Atrium: Left atrial size was mildly dilated. Right Atrium: Right atrial size was normal in size. Pericardium: There is no evidence of pericardial effusion. Mitral Valve: The mitral valve is normal in structure. Trivial mitral valve regurgitation. No evidence of mitral valve stenosis. Tricuspid Valve: The tricuspid valve is normal in structure. Tricuspid valve regurgitation is trivial. No evidence of tricuspid stenosis. Aortic Valve: The aortic valve is tricuspid. There is mild calcification of the aortic valve. Aortic valve regurgitation is not visualized. Aortic valve sclerosis/calcification is present, without any evidence of aortic stenosis. Pulmonic Valve: The pulmonic valve was normal in structure. Pulmonic valve regurgitation is trivial. No evidence of pulmonic stenosis. Aorta: The aortic root is normal in size and structure. Venous: The inferior vena cava is normal in size with greater than 50%  respiratory variability, suggesting right atrial pressure of 3 mmHg. IAS/Shunts: No atrial level shunt detected by color flow Doppler.  LEFT VENTRICLE PLAX 2D LVIDd:         3.50 cm   Diastology LVIDs:         2.10 cm   LV e' medial:    6.20 cm/s LV PW:         1.20 cm   LV E/e' medial:  13.9 LV IVS:        1.20 cm   LV e' lateral:   7.83 cm/s LVOT diam:     2.00 cm   LV E/e' lateral: 11.0 LV SV:         76 LV SV Index:   38 LVOT Area:     3.14 cm LV IVRT:       111 msec  IVC IVC diam: 1.40 cm LEFT ATRIUM             Index        RIGHT ATRIUM           Index LA diam:        3.50 cm 1.77 cm/m   RA Area:     15.10 cm LA Vol (A2C):   62.0 ml 31.40 ml/m  RA Volume:   43.70 ml  22.14 ml/m LA Vol (A4C):   54.4 ml 27.55 ml/m LA Biplane Vol: 61.7 ml 31.25 ml/m  AORTIC VALVE LVOT Vmax:   98.90 cm/s LVOT Vmean:  72.700 cm/s LVOT VTI:    0.241 m  AORTA Ao Root diam: 2.50 cm Ao Asc diam:  2.70 cm MITRAL VALVE                TRICUSPID VALVE MV Area (PHT): 4.15 cm     TR Peak grad:   33.4 mmHg MV Decel Time: 183 msec     TR Vmax:        289.00 cm/s MV E velocity: 85.90 cm/s MV A velocity: 146.00 cm/s  SHUNTS MV E/A ratio:  0.59  Systemic VTI:  0.24 m                             Systemic Diam: 2.00 cm Toribio Fuel MD Electronically signed by Toribio Fuel MD Signature Date/Time: 01/17/2024/3:28:15 PM    Final        LOS: 4 days   Joette Pebbles  [facility] Pager on [email or web address]  01/18/2024, 9:30 AM

## 2024-01-18 NOTE — Progress Notes (Signed)
 Occupational Therapy Treatment Patient Details Name: Tammy Mullins MRN: [id number] DOB: [birth year] Today's Date: 01/18/2024   History of present illness Tammy Mullins is a 73 y.o. year old female who presented 11/16 s/p fall hitting L side of head, CT spine, hip and head with no acute findings, dx with Traumatic rhabdomyolysis; placed on 3 L O2 in ED at [facility]. Transferred to [facility] 11/18. PMH:  hypertension, hyperlipidemia, bipolar disorder, ADD, Barrett's esophagus, DM2, fibromyalgia, gout, arthritis, aortic atherosclerosis, IBS, myalgia and myositis   OT comments  Pt making incremental progress towards OT goals. Pt able to manage UB ADLs sitting EOB with Setup-Min A. Able to progress transfers using RW with Mod Ax 2 needed d/t impaired motor planning and pt reporting L foot numbness. Pt setup in recliner, able to return demo ability to use [place] as well. Recommend nursing staff to use Stedy for BSC/recliner transfers at this time. Patient will benefit from continued inpatient follow up therapy, <3 hours/day at DC.      If plan is discharge home, recommend the following:  Two people to help with walking and/or transfers;A lot of help with bathing/dressing/bathroom;Assistance with cooking/housework;Assistance with feeding;Direct supervision/assist for medications management;Direct supervision/assist for financial management;Assist for transportation;Help with stairs or ramp for entrance;Supervision due to cognitive status   Equipment Recommendations  Other (comment) (TBD)    Recommendations for Other Services      Precautions / Restrictions Precautions Precautions: Fall Recall of Precautions/Restrictions: Intact Precaution/Restrictions Comments: L LE blisters Restrictions Weight Bearing Restrictions Per Provider Order: No       Mobility Bed Mobility Overal bed mobility: Needs Assistance Bed Mobility: Supine to Sit     Supine to sit: Mod assist, HOB elevated, Used rails      General bed mobility comments: cues for bedrail use, assist to scoot hips fully EOB and lift trunk    Transfers Overall transfer level: Needs assistance Equipment used: Rolling walker (2 wheels), Ambulation equipment used Transfers: Sit to/from Stand, Bed to chair/wheelchair/BSC Sit to Stand: Mod assist     Step pivot transfers: Mod assist, +2 safety/equipment, +2 physical assistance     General transfer comment: Mod A to stand from bedside with RW, difficulty bringing LLE in to narrow base of support. Mod A x 2 for pivot to recliner using RW with assist for RW advancement needed and cues to sequence steps appropriately. Once in recliner, introduced Stedy with pt able to pull self into with Mod A. Encouraged NT to use Stedy with pt during the day     Balance Overall balance assessment: Needs assistance Sitting-balance support: No upper extremity supported, Feet supported Sitting balance-Leahy Scale: Fair     Standing balance support: Bilateral upper extremity supported, During functional activity Standing balance-Leahy Scale: Poor                             ADL either performed or assessed with clinical judgement   ADL Overall ADL's : Needs assistance/impaired Eating/Feeding: Set up;Sitting   Grooming: Set up;Sitting;Wash/dry face           Upper Body Dressing : Minimal assistance;Sitting                          Extremity/Trunk Assessment Upper Extremity Assessment Upper Extremity Assessment: Generalized weakness;Right hand dominant   Lower Extremity Assessment Lower Extremity Assessment: Defer to PT evaluation        Vision   Vision  Assessment?: No apparent visual deficits   Perception     Praxis     Communication Communication Communication: No apparent difficulties   Cognition Arousal: Alert Behavior During Therapy: WFL for tasks assessed/performed Cognition: Cognition impaired     Awareness: Intellectual awareness impaired,  Online awareness impaired Memory impairment (select all impairments): Short-term memory, Working memory Attention impairment (select first level of impairment): Sustained attention Executive functioning impairment (select all impairments): Reasoning, Sequencing, Problem solving OT - Cognition Comments: slower processing, increased time and repetition for commands during transfers and motor skills                 Following commands: Impaired Following commands impaired: Follows one step commands with increased time, Follows one step commands inconsistently      Cueing   Cueing Techniques: Verbal cues, Tactile cues, Visual cues  Exercises      Shoulder Instructions       General Comments      Pertinent Vitals/ Pain       Pain Assessment Pain Assessment: Faces Faces Pain Scale: Hurts little more Pain Location: L LE Pain Descriptors / Indicators: Discomfort, Grimacing, Guarding, Sore Pain Intervention(s): Monitored during session, Limited activity within patient's tolerance  Home Living                                          Prior Functioning/Environment              Frequency  Min 2X/week        Progress Toward Goals  OT Goals(current goals can now be found in the care plan section)  Progress towards OT goals: Progressing toward goals  Acute Rehab OT Goals Patient Stated Goal: go to rehab, get home to cat and dog OT Goal Formulation: With patient Time For Goal Achievement: 01/29/24 Potential to Achieve Goals: Fair ADL Goals Pt Will Perform Lower Body Bathing: with min assist;sit to/from stand;with adaptive equipment Pt Will Perform Lower Body Dressing: with mod assist;with adaptive equipment;sit to/from stand Pt Will Transfer to Toilet: with mod assist;bedside commode;stand pivot transfer Pt Will Perform Toileting - Clothing Manipulation and hygiene: with min assist;sitting/lateral leans  Plan      Co-evaluation                  AM-PAC OT 6 Clicks Daily Activity     Outcome Measure   Help from another person eating meals?: A Little Help from another person taking care of personal grooming?: A Little Help from another person toileting, which includes using toliet, bedpan, or urinal?: Total Help from another person bathing (including washing, rinsing, drying)?: A Lot Help from another person to put on and taking off regular upper body clothing?: A Little Help from another person to put on and taking off regular lower body clothing?: Total 6 Click Score: 13    End of Session Equipment Utilized During Treatment: Gait belt;Rolling walker (2 wheels)  OT Visit Diagnosis: Unsteadiness on feet (R26.81);Muscle weakness (generalized) (M62.81);History of falling (Z91.81);Pain Pain - Right/Left: Left Pain - part of body: Leg;Hip   Activity Tolerance Patient tolerated treatment well   Patient Left in chair;with call bell/phone within reach;with chair alarm set   Nurse Communication Mobility status        Time: 8977-8947 OT Time Calculation (min): 30 min  Charges: OT General Charges $OT Visit: 1 Visit OT Treatments $Self Care/Home Management :  8-22 mins $Therapeutic Activity: 8-22 mins  Mliss NOVAK, OTR/L Acute Rehab Services Office: [phone]   Mliss Fish 01/18/2024, 1:50 PM

## 2024-01-18 NOTE — Plan of Care (Signed)
  Problem: Fluid Volume: Goal: Ability to maintain a balanced intake and output will improve Outcome: Progressing   Problem: Health Behavior/Discharge Planning: Goal: Ability to manage health-related needs will improve Outcome: Progressing   Problem: Nutritional: Goal: Progress toward achieving an optimal weight will improve Outcome: Progressing

## 2024-01-18 NOTE — Progress Notes (Signed)
 Nephrology Follow-Up Consult note   Assessment/Recommendations: Tammy Mullins is a/an 73 y.o. female with a past medical history significant for hypertension, hyperlipidemia, GAD, admitted for rhabdomyolysis.       Severe nonoliguric AKI: Secondary to rhabdomyolysis.  Urine output is improving.  Creatinine still rising however BUN not elevated and no uremic symptoms - Creatinine improving -Stop IV fluids and encourage oral hydration -Continue to monitor daily Cr, Dose meds for GFR -Monitor Daily I/Os, Daily weight  -Maintain MAP>65 for optimal renal perfusion.  -Avoid nephrotoxic medications including NSAIDs -Use synthetic opioids (Fentanyl /Dilaudid ) if needed -Currently no indication for HD  Hyponatremia: Mild likely associated with free water excess in the setting of AKI.  Continue to monitor  Hypokalemia: Mild decrease.  Replace as needed.  Metabolic acidosis: Mild.  Continue to monitor.  Likely to improve with improving kidney function  Essential hypertension: Reinstitute home blood pressure medications as needed   Recommendations conveyed to primary service.    [facility] 01/18/2024 11:55 AM  ___________________________________________________________  CC: Fall  Interval History/Subjective: Patient feels well today with no complaints.  Good urine output.  Creatinine improving   Medications:  Current Facility-Administered Medications  Medication Dose Route Frequency Provider Last Rate Last Admin   acetaminophen  (TYLENOL ) tablet 650 mg  650 mg Oral Q6H PRN Khan, Ghalib, MD   650 mg at 01/18/24 0850   Or   acetaminophen  (TYLENOL ) suppository 650 mg  650 mg Rectal Q6H PRN Khan, Ghalib, MD       buPROPion  (WELLBUTRIN  XL) 24 hr tablet 300 mg  300 mg Oral Daily Krishnan, Gokul, MD   300 mg at 01/18/24 9171   Chlorhexidine  Gluconate Cloth 2 % PADS 6 each  6 each Topical Daily Christobal Guadalajara, MD   6 each at 01/17/24 1005   gabapentin   (NEURONTIN ) capsule 300 mg  300 mg Oral QHS Krishnan, Gokul, MD   300 mg at 01/17/24 2215   heparin injection 5,000 Units  5,000 Units Subcutaneous Q8H Fernand Prost, MD   5,000 Units at 01/18/24 0549   insulin  aspart (novoLOG ) injection 0-9 Units  0-9 Units Subcutaneous TID WC Kc, Guadal[place]ara, MD   2 Units at 01/17/24 1252   methocarbamol  (ROBAXIN ) tablet 500 mg  500 mg Oral Q6H PRN Krishnan, Gokul, MD   500 mg at 01/18/24 9150   metoprolol  succinate (TOPROL -XL) 24 hr tablet 200 mg  200 mg Oral Daily Khan, Ghalib, MD   200 mg at 01/18/24 9171   pantoprazole  (PROTONIX ) EC tablet 40 mg  40 mg Oral Daily Khan, Ghalib, MD   40 mg at 01/18/24 0828   polyethylene glycol (MIRALAX / GLYCOLAX) packet 17 g  17 g Oral Daily Kc, Guadalajara, MD   17 g at 01/18/24 9171   QUEtiapine  (SEROQUEL ) tablet 600 mg  600 mg Oral QHS Khan, Ghalib, MD   600 mg at 01/17/24 2215   senna-docusate (Senokot-S) tablet 2 tablet  2 tablet Oral BID Krishnan, Gokul, MD   2 tablet at 01/18/24 9167   sodium chloride  flush (NS) 0.9 % injection 3 mL  3 mL Intravenous Q12H Khan, Ghalib, MD   3 mL at 01/16/24 2219      Review of Systems: 10 systems reviewed and negative except per interval history/subjective  Physical Exam: Vitals:   01/18/24 0351 01/18/24 0746  BP: 130/62 (!) 143/69  Pulse: 82 82  Resp: 18 17  Temp:  98 F (36.7 C)  SpO2: 97% 95%   Total I/O In: 240 [  P.O.:240] Out: 900 [Urine:900]  Intake/Output Summary (Last 24 hours) at 01/18/2024 1155 Last data filed at 01/18/2024 0900 Gross per 24 hour  Intake 1499.69 ml  Output 1900 ml  Net -400.31 ml   Constitutional: well-appearing, no acute distress ENMT: ears and nose without scars or lesions, MMM CV: normal rate, no edema Respiratory: Bilateral chest rise, normal work of breathing Gastrointestinal: soft, non-tender, no palpable masses or hernias Skin: no visible lesions or rashes Psych: alert, appropriate mood and affect   Test Results I personally reviewed  new and old clinical labs and radiology tests Lab Results  Component Value Date   NA 134 (L) 01/18/2024   K 3.5 01/18/2024   CL 101 01/18/2024   CO2 19 (L) 01/18/2024   BUN 42 (H) 01/18/2024   CREATININE 4.86 (H) 01/18/2024   GFR 85.07 01/02/2014   CALCIUM  8.0 (L) 01/18/2024   ALBUMIN 2.0 (L) 01/18/2024   PHOS 5.5 (H) 01/15/2024    CBC Recent Labs  Lab 01/14/24 2006 01/15/24 0435 01/16/24 0615 01/17/24 0531 01/18/24 0520  WBC 8.9   < > 7.7 7.3 9.3  NEUTROABS 7.0  --   --   --   --   HGB 13.1   < > 9.3* 9.4* 9.4*  HCT 40.5   < > 28.3* 28.2* 27.7*  MCV 93.3   < > 93.1 91.6 90.2  PLT 251   < > 175 173 198   < > = values in this interval not displayed.

## 2024-01-18 NOTE — TOC Progression Note (Addendum)
 Transition of Care [facility]) - Progression Note    Patient Details  Name: Tammy Mullins MRN: [id number] Date of Birth: March 01, 1950  [facility]) CM/SW Contact  Bridget Cordella Simmonds, LCSW Phone Number: 01/18/2024, 8:42 AM  Clinical Narrative:  Bed offers provided to pt on medicare choice document.  Pt will review.  SNF auth request made with [facility], facility choice pending.  PASSR still pending.    1135: PASSR received: [id number].  1415: CSW spoke with pt, she is still looking at her choices for SNF.  1535: TC Tammy/[facility]: SNF auth approved, 7 days: [id number], PTAR approved: [id number].  Expected Discharge Plan: Skilled Nursing Facility Barriers to Discharge: Continued Medical Work up, SNF Pending bed offer               Expected Discharge Plan and Services In-house Referral: Clinical Social Work   Post Acute Care Choice: Skilled Nursing Facility Living arrangements for the past 2 months: Single Family Home                                       Social Drivers of Health (SDOH) Interventions SDOH Screenings   Food Insecurity: No Food Insecurity (01/15/2024)  Housing: Low Risk  (01/15/2024)  Transportation Needs: No Transportation Needs (01/15/2024)  Utilities: Not At Risk (01/15/2024)  Alcohol Screen: Low Risk  (07/11/2022)  Physical Activity: Inactive (07/11/2022)  Social Connections: Unknown (01/15/2024)  Tobacco Use: Low Risk  (01/15/2024)    Readmission Risk Interventions    01/16/2024    1:14 PM  Readmission Risk Prevention Plan  Transportation Screening Complete  PCP or Specialist Appt within 5-7 Days Complete  Home Care Screening Complete  Medication Review (RN CM) Complete

## 2024-01-19 DIAGNOSIS — T796XXA Traumatic ischemia of muscle, initial encounter: Secondary | ICD-10-CM | POA: Diagnosis not present

## 2024-01-19 DIAGNOSIS — N179 Acute kidney failure, unspecified: Secondary | ICD-10-CM | POA: Diagnosis not present

## 2024-01-19 DIAGNOSIS — R7401 Elevation of levels of liver transaminase levels: Secondary | ICD-10-CM | POA: Diagnosis not present

## 2024-01-19 LAB — GLUCOSE, CAPILLARY
Glucose-Capillary: 105 mg/dL — ABNORMAL HIGH (ref 70–99)
Glucose-Capillary: 175 mg/dL — ABNORMAL HIGH (ref 70–99)
Glucose-Capillary: 140 mg/dL — ABNORMAL HIGH (ref 70–99)
Glucose-Capillary: 110 mg/dL — ABNORMAL HIGH (ref 70–99)

## 2024-01-19 LAB — COMPREHENSIVE METABOLIC PANEL WITH GFR
ALT: 80 U/L — ABNORMAL HIGH (ref 0–44)
AST: 101 U/L — ABNORMAL HIGH (ref 15–41)
Albumin: 2.3 g/dL — ABNORMAL LOW (ref 3.5–5.0)
Alkaline Phosphatase: 55 U/L (ref 38–126)
Anion gap: 9 (ref 5–15)
BUN: 43 mg/dL — ABNORMAL HIGH (ref 8–23)
CO2: 24 mmol/L (ref 22–32)
Calcium: 8.6 mg/dL — ABNORMAL LOW (ref 8.9–10.3)
Chloride: 102 mmol/L (ref 98–111)
Creatinine, Ser: 4.38 mg/dL — ABNORMAL HIGH (ref 0.44–1.00)
GFR, Estimated: 10 mL/min — ABNORMAL LOW (ref >60)
Glucose, Bld: 101 mg/dL — ABNORMAL HIGH (ref 70–99)
Potassium: 4.2 mmol/L (ref 3.5–5.1)
Sodium: 135 mmol/L (ref 135–145)
Total Bilirubin: 0.7 mg/dL (ref 0.0–1.2)
Total Protein: 5.5 g/dL — ABNORMAL LOW (ref 6.5–8.1)

## 2024-01-19 LAB — CBC
HCT: 29.2 % — ABNORMAL LOW (ref 36.0–46.0)
Hemoglobin: 9.8 g/dL — ABNORMAL LOW (ref 12.0–15.0)
MCH: 30.7 pg (ref 26.0–34.0)
MCHC: 33.6 g/dL (ref 30.0–36.0)
MCV: 91.5 fL (ref 80.0–100.0)
Platelets: 236 K/uL (ref 150–400)
RBC: 3.19 MIL/uL — ABNORMAL LOW (ref 3.87–5.11)
RDW: 14.9 % (ref 11.5–15.5)
WBC: 8 K/uL (ref 4.0–10.5)
nRBC: 0 % (ref 0.0–0.2)

## 2024-01-19 LAB — CK: Total CK: 5241 U/L — ABNORMAL HIGH (ref 38–234)

## 2024-01-19 MED ORDER — POLYETHYLENE GLYCOL 3350 17 G PO PACK
17.0000 g | PACK | Freq: Two times a day (BID) | ORAL | Status: DC
  Filled 2024-01-19 (×3): qty 1

## 2024-01-19 MED ORDER — BISACODYL 10 MG RE SUPP
10.0000 mg | Freq: Once | RECTAL | Status: AC
  Administered 2024-01-19: 10 mg via RECTAL
  Filled 2024-01-19: qty 1

## 2024-01-19 MED ORDER — OXYCODONE HCL 5 MG PO TABS
5.0000 mg | ORAL_TABLET | Freq: Four times a day (QID) | ORAL | Status: DC | PRN
  Administered 2024-01-20 – 2024-01-22 (×5): 5 mg via ORAL
  Filled 2024-01-19 (×6): qty 1

## 2024-01-19 NOTE — Progress Notes (Signed)
 Nephrology Follow-Up Consult note   Assessment/Recommendations: Tammy Mullins is a/an 73 y.o. female with a past medical history significant for hypertension, hyperlipidemia, GAD, admitted for rhabdomyolysis.       Severe nonoliguric AKI: Secondary to rhabdomyolysis.  Urine output is improving.  Creatinine now improving.  CK elevated around 5000 but likely will improve with time - Creatinine improving -Stop IV fluids and encourage oral hydration -Continue to monitor daily Cr, Dose meds for GFR -Monitor Daily I/Os, Daily weight  -Maintain MAP>65 for optimal renal perfusion.  -Avoid nephrotoxic medications including NSAIDs -Use synthetic opioids (Fentanyl /Dilaudid ) if needed -Currently no indication for HD - Labs are stable enough for I feel like we can sign off.  Continue to encourage oral hydration.  Likely able to leave over the weekend from my perspective.  I will set up follow-up in the outpatient setting  Hyponatremia: Resolved with improving kidney function  Hypokalemia: Resolved  Metabolic acidosis: Resolved with improving kidney function  Essential hypertension: Reinstitute home blood pressure medications as needed   Recommendations conveyed to primary service.    [facility] 01/19/2024 1:17 PM  ___________________________________________________________  CC: Fall  Interval History/Subjective: Patient feels well today with no complaints.  Good urine output.  Creatinine continues to improve   Medications:  Current Facility-Administered Medications  Medication Dose Route Frequency Provider Last Rate Last Admin   acetaminophen  (TYLENOL ) tablet 650 mg  650 mg Oral Q6H PRN Khan, Ghalib, MD   650 mg at 01/19/24 9161   Or   acetaminophen  (TYLENOL ) suppository 650 mg  650 mg Rectal Q6H PRN Fernand Prost, MD       bisacodyl  (DULCOLAX) suppository 10 mg  10 mg Rectal Once Krishnan, Gokul, MD       buPROPion  (WELLBUTRIN  XL) 24 hr tablet  300 mg  300 mg Oral Daily Krishnan, Gokul, MD   300 mg at 01/19/24 9165   Chlorhexidine  Gluconate Cloth 2 % PADS 6 each  6 each Topical Daily Christobal Guadalajara, MD   6 each at 01/19/24 0836   gabapentin  (NEURONTIN ) capsule 300 mg  300 mg Oral QHS Krishnan, Gokul, MD   300 mg at 01/18/24 2200   heparin  injection 5,000 Units  5,000 Units Subcutaneous Q8H Fernand Prost, MD   5,000 Units at 01/19/24 0602   insulin  aspart (novoLOG ) injection 0-9 Units  0-9 Units Subcutaneous TID WC Kc, Gu[place], MD   2 Units at 01/19/24 1214   methocarbamol  (ROBAXIN ) tablet 500 mg  500 mg Oral Q6H PRN Krishnan, Gokul, MD   500 mg at 01/19/24 9161   metoprolol  succinate (TOPROL -XL) 24 hr tablet 200 mg  200 mg Oral Daily Khan, Ghalib, MD   200 mg at 01/19/24 9165   oxyCODONE  (Oxy IR/ROXICODONE ) immediate release tablet 5 mg  5 mg Oral Q6H PRN Krishnan, Gokul, MD       pantoprazole  (PROTONIX ) EC tablet 40 mg  40 mg Oral Daily Khan, Ghalib, MD   40 mg at 01/19/24 0834   polyethylene glycol (MIRALAX  / GLYCOLAX ) packet 17 g  17 g Oral BID Krishnan, Gokul, MD       QUEtiapine  (SEROQUEL ) tablet 600 mg  600 mg Oral QHS Khan, Ghalib, MD   600 mg at 01/18/24 2159   senna-docusate (Senokot-S) tablet 2 tablet  2 tablet Oral BID Krishnan, Gokul, MD   2 tablet at 01/19/24 0834   sodium chloride  flush (NS) 0.9 % injection 3 mL  3 mL Intravenous Q12H Khan, Ghalib, MD   3 mL  at 01/19/24 0840      Review of Systems: 10 systems reviewed and negative except per interval history/subjective  Physical Exam: Vitals:   01/19/24 0327 01/19/24 0745  BP: (!) 155/76 (!) 160/82  Pulse: 84 93  Resp:  16  Temp: 98.6 F (37 C) 98.9 F (37.2 C)  SpO2: 94% 92%   No intake/output data recorded.  Intake/Output Summary (Last 24 hours) at 01/19/2024 1317 Last data filed at 01/19/2024 0700 Gross per 24 hour  Intake --  Output 2500 ml  Net -2500 ml   Constitutional: well-appearing, no acute distress ENMT: ears and nose without scars or lesions,  MMM CV: normal rate, no edema Respiratory: Bilateral chest rise, normal work of breathing Gastrointestinal: soft, non-tender, no palpable masses or hernias Skin: no visible lesions or rashes Psych: alert, appropriate mood and affect   Test Results I personally reviewed new and old clinical labs and radiology tests Lab Results  Component Value Date   NA 135 01/19/2024   K 4.2 01/19/2024   CL 102 01/19/2024   CO2 24 01/19/2024   BUN 43 (H) 01/19/2024   CREATININE 4.38 (H) 01/19/2024   GFR 85.07 01/02/2014   CALCIUM  8.6 (L) 01/19/2024   ALBUMIN 2.3 (L) 01/19/2024   PHOS 5.5 (H) 01/15/2024    CBC Recent Labs  Lab 01/14/24 2006 01/15/24 0435 01/17/24 0531 01/18/24 0520 01/19/24 0432  WBC 8.9   < > 7.3 9.3 8.0  NEUTROABS 7.0  --   --   --   --   HGB 13.1   < > 9.4* 9.4* 9.8*  HCT 40.5   < > 28.2* 27.7* 29.2*  MCV 93.3   < > 91.6 90.2 91.5  PLT 251   < > 173 198 236   < > = values in this interval not displayed.

## 2024-01-19 NOTE — TOC Progression Note (Addendum)
 Transition of Care [facility]) - Progression Note    Patient Details  Name: Tammy Mullins MRN: [id number] Date of Birth: 12/24/1950  [facility]) CM/SW Contact  Bridget Cordella Simmonds, LCSW Phone Number: 01/19/2024, 9:45 AM  Clinical Narrative:   CSW spoke with pt and she would like to accept offer at [facility].  CSW message with Nikki[facility]: bed likely not available until Monday.   [facility] informed of facility choice.    Expected Discharge Plan: Skilled Nursing Facility Barriers to Discharge: Continued Medical Work up, SNF Pending bed offer               Expected Discharge Plan and Services In-house Referral: Clinical Social Work   Post Acute Care Choice: Skilled Nursing Facility Living arrangements for the past 2 months: Single Family Home                                       Social Drivers of Health (SDOH) Interventions SDOH Screenings   Food Insecurity: No Food Insecurity (01/15/2024)  Housing: Low Risk  (01/15/2024)  Transportation Needs: No Transportation Needs (01/15/2024)  Utilities: Not At Risk (01/15/2024)  Alcohol Screen: Low Risk  (07/11/2022)  Physical Activity: Inactive (07/11/2022)  Social Connections: Unknown (01/15/2024)  Tobacco Use: Low Risk  (01/15/2024)    Readmission Risk Interventions    01/16/2024    1:14 PM  Readmission Risk Prevention Plan  Transportation Screening Complete  PCP or Specialist Appt within 5-7 Days Complete  Home Care Screening Complete  Medication Review (RN CM) Complete

## 2024-01-19 NOTE — Progress Notes (Addendum)
 [facility] PROGRESS NOTE   Tammy Mullins FMW:[id number] DOB: 1950/10/28 DOA: 01/14/2024  PCP: Wonda Worth SQUIBB, PA  Brief History: 73 y.o. female with PMH of hypertension, hyperlipidemia, bipolar disorder, GAD presenting to the ED after a fall.  Patient had a fall while she was going to the kitchen to get some water.  She thinks she may have had a syncopal episode.  She was on the floor for several hours.  Evaluation in the ED raised concern for rhabdomyolysis.  She was noted to have acute kidney injury.  She was hospitalized for further management.  Consultants: Nephrology  Procedures: None yet    Subjective/Interval History: Patient complains of pain in the left lower extremity.  Also complains of pain in the lower back which is chronic.  Does complain of constipation as well.  Assessment/Plan:  Traumatic rhabdomyolysis secondary to fall at home CK level was greater than 20,000.  Patient was aggressively hydrated.  CK levels gradually trending down.  Continue to monitor every so often.  Acute kidney injury/hypokalemia/hyperphosphatemia/hyponatremia Most likely secondary to rhabdomyolysis.   Nephrology was consulted.  Patient was given IV fluids.  Patient has had good urine output the last few days.  Creatinine appears to have plateaued some.  IV fluids were discontinued by nephrology yesterday.  Will see how she does over the next 24 hours. Electrolytes are stable.   Continue Foley catheter for now.  Concern for syncope Echocardiogram shows normal LVEF.  Grade 1 diastolic dysfunction noted.  Right ventricular function was normal.  No significant valvular abnormalities noted.  No arrhythmia on telemetry.  No further workup at this time.   Cardiac monitor can be considered in the outpatient setting.  Left lower extremity weakness Most likely due to rhabdomyolysis.  MRI lumbar spine without acute findings.  New PT and OT.  Pain medications. Imaging studies were reviewed  again.  No evidence for any fractures.  Blisters left lower extremity Etiology unclear but could be related to trauma.  Mains stable.  Continue wound care.  Normocytic anemia No evidence for overt bleeding.  Monitor closely.  Transaminitis Secondary to rhabdomyolysis.  Hold statin.  LFTs had improved.  Noted to be slightly higher today.  Continue to monitor daily for now.  Hyperlipidemia Holding statin due to abnormal LFTs from rhabdomyolysis.  Essential hypertension Monitor blood pressures.  Noted to be on metoprolol .  Amlodipine  is on hold.  Avoid aggressive control of blood pressure due to AKI.  Constipation Has not had a bowel movement in several days.  Continue with bowel regimen.  Suppository will be ordered.  Diabetes mellitus type 2, controlled Monitor CBGs. HbA1c 6.3.  Bipolar disorder Stable.  Class I obesity Estimated body mass index is 30.71 kg/m as calculated from the following:   Height as of this encounter: 5' 5.5 (1.664 m).   Weight as of this encounter: 85 kg.   DVT Prophylaxis: Subcutaneous heparin  Code Status: Full code Family Communication: Discussed with patient Disposition Plan: SNF is recommended when medically stable.  Not quite ready for discharge yet.     Medications: Scheduled:  bisacodyl   10 mg Rectal Once   buPROPion   300 mg Oral Daily   Chlorhexidine  Gluconate Cloth  6 each Topical Daily   gabapentin   300 mg Oral QHS   heparin   5,000 Units Subcutaneous Q8H   insulin  aspart  0-9 Units Subcutaneous TID WC   metoprolol   200 mg Oral Daily   pantoprazole   40 mg Oral Daily   polyethylene glycol  17 g Oral BID   QUEtiapine   600 mg Oral QHS   senna-docusate  2 tablet Oral BID   sodium chloride  flush  3 mL Intravenous Q12H   Continuous:   PRN:acetaminophen  **OR** acetaminophen , methocarbamol , oxyCODONE    Objective:  Vital Signs  Vitals:   01/18/24 2000 01/19/24 0327 01/19/24 0500 01/19/24 0745  BP: (!) 152/78 (!) 155/76  (!)  160/82  Pulse: 79 84  93  Resp: 17   16  Temp: 99.1 F (37.3 C) 98.6 F (37 C)  98.9 F (37.2 C)  TempSrc: Oral Oral  Oral  SpO2: 97% 94%  92%  Weight:   85 kg   Height:        Intake/Output Summary (Last 24 hours) at 01/19/2024 0944 Last data filed at 01/19/2024 0700 Gross per 24 hour  Intake 240 ml  Output 2500 ml  Net -2260 ml   Filed Weights   01/16/24 0500 01/17/24 0500 01/19/24 0500  Weight: 88.2 kg 89.1 kg 85 kg    General appearance: Awake alert.  In no distress Resp: Clear to auscultation bilaterally.  Normal effort Cardio: S1-S2 is normal regular.  No S3-S4.  No rubs murmurs or bruit GI: Abdomen is soft.  Nontender nondistended.  Bowel sounds are present normal.  No masses organomegaly Extremities: Continues to have significant discomfort with moving her left lower extremity.  Some edema is noted.  Good pedal pulses. No obvious focal neurological deficits.   Lab Results:  Data Reviewed: I have personally reviewed following labs and reports of the imaging studies  CBC: Recent Labs  Lab 01/14/24 2006 01/15/24 0435 01/16/24 0615 01/17/24 0531 01/18/24 0520 01/19/24 0432  WBC 8.9 7.6 7.7 7.3 9.3 8.0  NEUTROABS 7.0  --   --   --   --   --   HGB 13.1 10.2* 9.3* 9.4* 9.4* 9.8*  HCT 40.5 30.9* 28.3* 28.2* 27.7* 29.2*  MCV 93.3 93.9 93.1 91.6 90.2 91.5  PLT 251 190 175 173 198 236    Basic Metabolic Panel: Recent Labs  Lab 01/15/24 0435 01/15/24 1527 01/16/24 0615 01/17/24 0531 01/18/24 0520 01/19/24 0432  NA 135 132* 131* 132* 134* 135  K 3.2* 3.8 3.2* 3.4* 3.5 4.2  CL 99 99 99 100 101 102  CO2 25 23 22  19* 19* 24  GLUCOSE 128* 113* 109* 101* 101* 101*  BUN 23 27* 32* 39* 42* 43*  CREATININE 2.75* 3.25* 4.23* 5.31* 4.86* 4.38*  CALCIUM  8.1* 8.0* 7.9* 8.3* 8.0* 8.6*  MG  --  1.3*  --   --   --   --   PHOS 5.5*  --   --   --   --   --     GFR: Estimated Creatinine Clearance: 12.1 mL/min (A) (by C-G formula based on SCr of 4.38 mg/dL  (H)).  Liver Function Tests: Recent Labs  Lab 01/14/24 2006 01/16/24 0615 01/17/24 0531 01/18/24 0520 01/19/24 0432  AST 829* 238* 109* 84* 101*  ALT 171* 94* 70* 64* 80*  ALKPHOS 89 67 51 54 55  BILITOT 0.4 0.3 0.6 0.7 0.7  PROT 6.8 5.1* 4.9* 4.9* 5.5*  ALBUMIN 3.8 2.9* 2.2* 2.0* 2.3*    Cardiac Enzymes: Recent Labs  Lab 01/14/24 2006 01/16/24 0615 01/17/24 0531 01/18/24 0520 01/19/24 0432  CKTOTAL >20,000* 10,342* 7,898* 5,013* 5,241*    CBG: Recent Labs  Lab 01/18/24 0830 01/18/24 1135 01/18/24 1603 01/18/24 2106 01/19/24 0618  GLUCAP 114* 226* 88 97 105*  Radiology Studies: MR LUMBAR SPINE WO CONTRAST Result Date: 01/17/2024 EXAM: MRI LUMBAR SPINE 01/17/2024 03:25:00 PM TECHNIQUE: Multiplanar multisequence MRI of the lumbar spine was performed without the administration of intravenous contrast. COMPARISON: MR Lumbar Spine Without IV Contrast 01/24/2018. CT lumbar spine 03/03/2022. CLINICAL HISTORY: Low back pain, trauma. FINDINGS: BONES AND ALIGNMENT: Posterior instrumented fusion spanning L3 to S1 with bilateral pedicle screws and vertical interconnecting rods. Interbody spacers are noted at the L3-L4 through L5-S1 levels. Postsurgical changes are present within the midline paraspinal soft tissues of the lower lumbar spine. Lumbar lordosis is maintained. Normal vertebral body heights. Bone marrow signal is mildly heterogeneous. There is no bone marrow edema or evidence of acute fracture. Mild degenerative endplate changes are present at L2-L3. SPINAL CORD: The conus medullaris extends to the L2 level. SOFT TISSUES: Postsurgical changes are present within the midline paraspinal soft tissues of the lower lumbar spine. Mild atrophy of the paraspinal musculature. T12-L1: No significant spinal canal or foraminal stenosis. L1-L2: There is a small disc bulge and mild facet arthrosis. No significant spinal canal or foraminal stenosis. L2-L3: There is mild disc height loss  and disc desiccation. A diffuse disc bulge indents the ventral thecal sac and contributes to lateral recess narrowing. Moderate facet arthrosis with bilateral facet effusions. Thickening of the ligamentum flavum. Moderate spinal canal stenosis with crowding of the cauda equina roots. There is mild bilateral foraminal stenosis. The disc bulge and facet osteophytes possibly contact the exiting right L2 nerve root. L3-L4: Postsurgical changes are present. No significant residual spinal canal stenosis. Small right subarticular disc protrusion. No significant foraminal stenosis. L4-L5: Postsurgical changes are present. No significant residual spinal canal stenosis. No significant foraminal stenosis. L5-S1: Postsurgical changes are present. Small disc bulge. No significant spinal canal stenosis. There is moderate foraminal stenosis on the right. IMPRESSION: 1. No acute findings. 2. Posterior instrumented fusion spanning L3 to S1. No significant residual spinal canal stenosis at operative levels. 3. Moderate spinal canal stenosis at L2-3 with crowding of the cauda equina roots and mild bilateral foraminal stenosis. Disc bulge and facet osteophytes may contact the exiting right L2 nerve root. Findings suggestive of adjacent segment disease. 4. Additional degenerative changes as above. Moderate right foraminal stenosis at L5-S1. Electronically signed by: Donnice Mania MD 01/17/2024 04:33 PM EST RP Workstation: [id number]   ECHOCARDIOGRAM COMPLETE Result Date: 01/17/2024    ECHOCARDIOGRAM REPORT   Patient Name:   Tammy Mullins Date of Exam: 01/17/2024 Medical Rec #:  [id number]      Height:       65.5 in Accession #:    [id number]     Weight:       196.4 lb Date of Birth:  Jan 16, 1951      BSA:          1.974 m Patient Age:    73 years       BP:           128/68 mmHg Patient Gender: F              HR:           84 bpm. Exam Location:  Inpatient Procedure: 2D Echo, Cardiac Doppler and Color Doppler (Both Spectral and Color             Flow Doppler were utilized during procedure). Indications:    Syncope R55  History:        Patient has no prior history of Echocardiogram examinations.  Risk Factors:Diabetes.  Sonographer:    Tinnie Gosling RDCS Referring Phys: [id number] RAMESH KC IMPRESSIONS  1. Left ventricular ejection fraction, by estimation, is 60 to 65%. The left ventricle has normal function. The left ventricle has no regional wall motion abnormalities. There is mild concentric left ventricular hypertrophy. Left ventricular diastolic parameters are consistent with Grade I diastolic dysfunction (impaired relaxation).  2. Right ventricular systolic function is normal. The right ventricular size is normal.  3. Left atrial size was mildly dilated.  4. The mitral valve is normal in structure. Trivial mitral valve regurgitation. No evidence of mitral stenosis.  5. The aortic valve is tricuspid. There is mild calcification of the aortic valve. Aortic valve regurgitation is not visualized. Aortic valve sclerosis/calcification is present, without any evidence of aortic stenosis.  6. The inferior vena cava is normal in size with greater than 50% respiratory variability, suggesting right atrial pressure of 3 mmHg. FINDINGS  Left Ventricle: Left ventricular ejection fraction, by estimation, is 60 to 65%. The left ventricle has normal function. The left ventricle has no regional wall motion abnormalities. The left ventricular internal cavity size was normal in size. There is  mild concentric left ventricular hypertrophy. Left ventricular diastolic parameters are consistent with Grade I diastolic dysfunction (impaired relaxation). Right Ventricle: The right ventricular size is normal. No increase in right ventricular wall thickness. Right ventricular systolic function is normal. Left Atrium: Left atrial size was mildly dilated. Right Atrium: Right atrial size was normal in size. Pericardium: There is no evidence of pericardial  effusion. Mitral Valve: The mitral valve is normal in structure. Trivial mitral valve regurgitation. No evidence of mitral valve stenosis. Tricuspid Valve: The tricuspid valve is normal in structure. Tricuspid valve regurgitation is trivial. No evidence of tricuspid stenosis. Aortic Valve: The aortic valve is tricuspid. There is mild calcification of the aortic valve. Aortic valve regurgitation is not visualized. Aortic valve sclerosis/calcification is present, without any evidence of aortic stenosis. Pulmonic Valve: The pulmonic valve was normal in structure. Pulmonic valve regurgitation is trivial. No evidence of pulmonic stenosis. Aorta: The aortic root is normal in size and structure. Venous: The inferior vena cava is normal in size with greater than 50% respiratory variability, suggesting right atrial pressure of 3 mmHg. IAS/Shunts: No atrial level shunt detected by color flow Doppler.  LEFT VENTRICLE PLAX 2D LVIDd:         3.50 cm   Diastology LVIDs:         2.10 cm   LV e' medial:    6.20 cm/s LV PW:         1.20 cm   LV E/e' medial:  13.9 LV IVS:        1.20 cm   LV e' lateral:   7.83 cm/s LVOT diam:     2.00 cm   LV E/e' lateral: 11.0 LV SV:         76 LV SV Index:   38 LVOT Area:     3.14 cm LV IVRT:       111 msec  IVC IVC diam: 1.40 cm LEFT ATRIUM             Index        RIGHT ATRIUM           Index LA diam:        3.50 cm 1.77 cm/m   RA Area:     15.10 cm LA Vol (A2C):   62.0 ml 31.40 ml/m  RA Volume:  43.70 ml  22.14 ml/m LA Vol (A4C):   54.4 ml 27.55 ml/m LA Biplane Vol: 61.7 ml 31.25 ml/m  AORTIC VALVE LVOT Vmax:   98.90 cm/s LVOT Vmean:  72.700 cm/s LVOT VTI:    0.241 m  AORTA Ao Root diam: 2.50 cm Ao Asc diam:  2.70 cm MITRAL VALVE                TRICUSPID VALVE MV Area (PHT): 4.15 cm     TR Peak grad:   33.4 mmHg MV Decel Time: 183 msec     TR Vmax:        289.00 cm/s MV E velocity: 85.90 cm/s MV A velocity: 146.00 cm/s  SHUNTS MV E/A ratio:  0.59         Systemic VTI:  0.24 m                              Systemic Diam: 2.00 cm Toribio Fuel MD Electronically signed by Toribio Fuel MD Signature Date/Time: 01/17/2024/3:28:15 PM    Final        LOS: 5 days   Tammy Mullins  [facility] Pager on [email or web address]  01/19/2024, 9:44 AM

## 2024-01-19 NOTE — Progress Notes (Signed)
 Physical Therapy Treatment Patient Details Name: Tammy Mullins MRN: [id number] DOB: [birth year] Today's Date: 01/19/2024   History of Present Illness Tammy Mullins is a 73 y.o. year old female who presented 11/16 s/p fall hitting L side of head, CT spine, hip and head with no acute findings, dx with Traumatic rhabdomyolysis; placed on 3 L O2 in ED at [facility]. Transferred to [facility] 11/18. PMH:  hypertension, hyperlipidemia, bipolar disorder, ADD, Barrett's esophagus, DM2, fibromyalgia, gout, arthritis, aortic atherosclerosis, IBS, myalgia and myositis    PT Comments  Pt required mod assist bed mobility and mod assist lateral scoot transfer bed to drop arm recliner. Pt in recliner with feet elevated at end of session. Current POC remains appropriate.     If plan is discharge home, recommend the following: Two people to help with walking and/or transfers;A lot of help with bathing/dressing/bathroom;Assist for transportation;Help with stairs or ramp for entrance;Assistance with cooking/housework;Direct supervision/assist for medications management;Direct supervision/assist for financial management   Can travel by private vehicle     No  Equipment Recommendations  BSC/3in1;Wheelchair (measurements PT);Wheelchair cushion (measurements PT);Hospital bed    Recommendations for Other Services       Precautions / Restrictions Precautions Precautions: Fall;Other (comment) Recall of Precautions/Restrictions: Intact Precaution/Restrictions Comments: L LE blisters     Mobility  Bed Mobility Overal bed mobility: Needs Assistance Bed Mobility: Supine to Sit     Supine to sit: Mod assist, Used rails, HOB elevated     General bed mobility comments: assist with trunk and LLE, increased time, use of bed pad to scoot to EOB    Transfers Overall transfer level: Needs assistance   Transfers: Bed to chair/wheelchair/BSC             General transfer comment: lateral scoot bed to drop arm recliner  toward R    Ambulation/Gait                   Stairs             Wheelchair Mobility     Tilt Bed    Modified Rankin (Stroke Patients Only)       Balance Overall balance assessment: Needs assistance Sitting-balance support: No upper extremity supported, Feet supported Sitting balance-Leahy Scale: Fair                                      Hotel Manager: No apparent difficulties  Cognition Arousal: Alert Behavior During Therapy: WFL for tasks assessed/performed   PT - Cognitive impairments: Awareness, Sequencing, Problem solving, Safety/Judgement, Initiation, Attention                           Following commands impaired: Only follows one step commands consistently, Follows one step commands with increased time    Cueing Cueing Techniques: Verbal cues, Tactile cues, Visual cues  Exercises      General Comments        Pertinent Vitals/Pain Pain Assessment Pain Assessment: Faces Faces Pain Scale: Hurts little more Pain Location: L LE Pain Descriptors / Indicators: Discomfort, Grimacing, Guarding, Sore Pain Intervention(s): Monitored during session, Repositioned    Home Living                          Prior Function            PT Goals (current  goals can now be found in the care plan section) Acute Rehab PT Goals Patient Stated Goal: rehab then home Progress towards PT goals: Progressing toward goals    Frequency    Min 2X/week      PT Plan      Co-evaluation              AM-PAC PT 6 Clicks Mobility   Outcome Measure  Help needed turning from your back to your side while in a flat bed without using bedrails?: A Little Help needed moving from lying on your back to sitting on the side of a flat bed without using bedrails?: A Lot Help needed moving to and from a bed to a chair (including a wheelchair)?: A Lot Help needed standing up from a chair using your  arms (e.g., wheelchair or bedside chair)?: A Lot Help needed to walk in hospital room?: Total Help needed climbing 3-5 steps with a railing? : Total 6 Click Score: 11    End of Session Equipment Utilized During Treatment: Gait belt Activity Tolerance: Patient tolerated treatment well Patient left: in chair;with call bell/phone within reach;with chair alarm set Nurse Communication: Mobility status PT Visit Diagnosis: Unsteadiness on feet (R26.81);Muscle weakness (generalized) (M62.81);History of falling (Z91.81);Other abnormalities of gait and mobility (R26.89);Difficulty in walking, not elsewhere classified (R26.2)     Time: 8954-8940 PT Time Calculation (min) (ACUTE ONLY): 14 min  Charges:    $Therapeutic Activity: 8-22 mins PT General Charges $$ ACUTE PT VISIT: 1 Visit                     Sari MATSU., PT  Office # [phone]    Erven Sari Shaker 01/19/2024, 12:32 PM

## 2024-01-19 NOTE — Plan of Care (Signed)
  Problem: Skin Integrity: Goal: Risk for impaired skin integrity will decrease Outcome: Progressing   Problem: Activity: Goal: Risk for activity intolerance will decrease Outcome: Progressing   Problem: Nutrition: Goal: Adequate nutrition will be maintained Outcome: Progressing   Problem: Elimination: Goal: Will not experience complications related to bowel motility Outcome: Progressing   Problem: Pain Managment: Goal: General experience of comfort will improve and/or be controlled Outcome: Progressing   Problem: Safety: Goal: Ability to remain free from injury will improve Outcome: Progressing

## 2024-01-20 DIAGNOSIS — R7401 Elevation of levels of liver transaminase levels: Secondary | ICD-10-CM | POA: Diagnosis not present

## 2024-01-20 DIAGNOSIS — T796XXA Traumatic ischemia of muscle, initial encounter: Secondary | ICD-10-CM | POA: Diagnosis not present

## 2024-01-20 DIAGNOSIS — N179 Acute kidney failure, unspecified: Secondary | ICD-10-CM | POA: Diagnosis not present

## 2024-01-20 LAB — COMPREHENSIVE METABOLIC PANEL WITH GFR
ALT: 87 U/L — ABNORMAL HIGH (ref 0–44)
AST: 88 U/L — ABNORMAL HIGH (ref 15–41)
Albumin: 2.4 g/dL — ABNORMAL LOW (ref 3.5–5.0)
Alkaline Phosphatase: 57 U/L (ref 38–126)
Anion gap: 15 (ref 5–15)
BUN: 37 mg/dL — ABNORMAL HIGH (ref 8–23)
CO2: 20 mmol/L — ABNORMAL LOW (ref 22–32)
Calcium: 8.8 mg/dL — ABNORMAL LOW (ref 8.9–10.3)
Chloride: 104 mmol/L (ref 98–111)
Creatinine, Ser: 3.28 mg/dL — ABNORMAL HIGH (ref 0.44–1.00)
GFR, Estimated: 14 mL/min — ABNORMAL LOW (ref >60)
Glucose, Bld: 115 mg/dL — ABNORMAL HIGH (ref 70–99)
Potassium: 4 mmol/L (ref 3.5–5.1)
Sodium: 139 mmol/L (ref 135–145)
Total Bilirubin: 0.7 mg/dL (ref 0.0–1.2)
Total Protein: 5.5 g/dL — ABNORMAL LOW (ref 6.5–8.1)

## 2024-01-20 LAB — CBC
HCT: 29.2 % — ABNORMAL LOW (ref 36.0–46.0)
Hemoglobin: 9.8 g/dL — ABNORMAL LOW (ref 12.0–15.0)
MCH: 30.2 pg (ref 26.0–34.0)
MCHC: 33.6 g/dL (ref 30.0–36.0)
MCV: 90.1 fL (ref 80.0–100.0)
Platelets: 221 K/uL (ref 150–400)
RBC: 3.24 MIL/uL — ABNORMAL LOW (ref 3.87–5.11)
RDW: 15.2 % (ref 11.5–15.5)
WBC: 7.6 K/uL (ref 4.0–10.5)
nRBC: 0 % (ref 0.0–0.2)

## 2024-01-20 LAB — GLUCOSE, CAPILLARY
Glucose-Capillary: 118 mg/dL — ABNORMAL HIGH (ref 70–99)
Glucose-Capillary: 116 mg/dL — ABNORMAL HIGH (ref 70–99)
Glucose-Capillary: 109 mg/dL — ABNORMAL HIGH (ref 70–99)
Glucose-Capillary: 100 mg/dL — ABNORMAL HIGH (ref 70–99)

## 2024-01-20 LAB — CK: Total CK: 3193 U/L — ABNORMAL HIGH (ref 38–234)

## 2024-01-20 NOTE — Plan of Care (Signed)
   Problem: Nutritional: Goal: Maintenance of adequate nutrition will improve Outcome: Progressing   Problem: Skin Integrity: Goal: Risk for impaired skin integrity will decrease Outcome: Progressing

## 2024-01-20 NOTE — Progress Notes (Signed)
 [facility] PROGRESS NOTE   Tammy Mullins FMW:[id number] DOB: November 10, 1950 DOA: 01/14/2024  PCP: Wonda Worth SQUIBB, PA  Brief History: 73 y.o. female with PMH of hypertension, hyperlipidemia, bipolar disorder, GAD presenting to the ED after a fall.  Patient had a fall while she was going to the kitchen to get some water.  She thinks she may have had a syncopal episode.  She was on the floor for several hours.  Evaluation in the ED raised concern for rhabdomyolysis.  She was noted to have acute kidney injury.  She was hospitalized for further management.  Consultants: Nephrology  Procedures: None yet    Subjective/Interval History: Patient mentioned that she is feeling better.  Able to move her left leg better than yesterday.  Pain in the lower back is reasonably well-controlled.    Assessment/Plan:  Traumatic rhabdomyolysis secondary to fall at home CK level was greater than 20,000.  Patient was aggressively hydrated.  CK levels gradually trending down.  Continue to monitor every so often.  Acute kidney injury/hypokalemia/hyperphosphatemia/hyponatremia Most likely secondary to rhabdomyolysis.   Nephrology was consulted.  Patient was given IV fluids.  Patient's urine output picked up.  Creatinine appears to be improving.  IV fluids were discontinued. Electrolytes are stable. Continue to monitor urine output.  Avoid nephrotoxic agents. If creatinine continues to improve then Foley catheter can be discontinued tomorrow.  Concern for syncope Echocardiogram shows normal LVEF.  Grade 1 diastolic dysfunction noted.  Right ventricular function was normal.  No significant valvular abnormalities noted.  No arrhythmia on telemetry.  No further workup at this time.   Cardiac monitor can be considered in the outpatient setting.  Left lower extremity weakness Most likely due to rhabdomyolysis.  MRI lumbar spine without acute findings.  New PT and OT.  Pain medications. Old reports of  imaging studies were reviewed again.  No evidence for any fractures.  Blisters left lower extremity Etiology unclear but could be related to trauma.  Mains stable.  Continue wound care.  Normocytic anemia No evidence for overt bleeding.  Monitor closely.  Transaminitis Secondary to rhabdomyolysis.  Hold statin.  LFTs continue to improve.  Hyperlipidemia Holding statin due to abnormal LFTs from rhabdomyolysis.  Essential hypertension Monitor blood pressures.  Noted to be on metoprolol .  Amlodipine  is on hold.  Avoid aggressive control of blood pressure due to AKI.  Constipation Seems to have had bowel movements yesterday after she was started on bowel regimen.  Diabetes mellitus type 2, controlled Monitor CBGs. HbA1c 6.3.  Bipolar disorder Stable.  Class I obesity Estimated body mass index is 30.13 kg/m as calculated from the following:   Height as of this encounter: 5' 5.5 (1.664 m).   Weight as of this encounter: 83.4 kg.   DVT Prophylaxis: Subcutaneous heparin  Code Status: Full code Family Communication: Discussed with patient Disposition Plan: SNF is recommended when medically stable.  Not quite ready for discharge yet.     Medications: Scheduled:  buPROPion   300 mg Oral Daily   Chlorhexidine  Gluconate Cloth  6 each Topical Daily   gabapentin   300 mg Oral QHS   heparin   5,000 Units Subcutaneous Q8H   insulin  aspart  0-9 Units Subcutaneous TID WC   metoprolol   200 mg Oral Daily   pantoprazole   40 mg Oral Daily   polyethylene glycol  17 g Oral BID   QUEtiapine   600 mg Oral QHS   senna-docusate  2 tablet Oral BID   sodium chloride  flush  3 mL  Intravenous Q12H   Continuous:   PRN:acetaminophen  **OR** acetaminophen , methocarbamol , oxyCODONE    Objective:  Vital Signs  Vitals:   01/19/24 0745 01/19/24 1929 01/20/24 0332 01/20/24 0500  BP: (!) 160/82 (!) 152/65 (!) 155/71   Pulse: 93 74 88   Resp: 16 17 17    Temp: 98.9 F (37.2 C) 98.7 F (37.1 C)  98.7 F (37.1 C)   TempSrc: Oral Oral    SpO2: 92% 94% 93%   Weight:    83.4 kg  Height:        Intake/Output Summary (Last 24 hours) at 01/20/2024 1002 Last data filed at 01/20/2024 0544 Gross per 24 hour  Intake 480 ml  Output 2650 ml  Net -2170 ml   Filed Weights   01/17/24 0500 01/19/24 0500 01/20/24 0500  Weight: 89.1 kg 85 kg 83.4 kg    General appearance: Awake alert.  In no distress Resp: Clear to auscultation bilaterally.  Normal effort Cardio: S1-S2 is normal regular.  No S3-S4.  No rubs murmurs or bruit GI: Abdomen is soft.  Nontender nondistended.  Bowel sounds are present normal.  No masses organomegaly Extremities: Improved mobility of the left lower extremity noted. No obvious focal neurological deficits.   Lab Results:  Data Reviewed: I have personally reviewed following labs and reports of the imaging studies  CBC: Recent Labs  Lab 01/14/24 2006 01/15/24 0435 01/16/24 0615 01/17/24 0531 01/18/24 0520 01/19/24 0432 01/20/24 0622  WBC 8.9   < > 7.7 7.3 9.3 8.0 7.6  NEUTROABS 7.0  --   --   --   --   --   --   HGB 13.1   < > 9.3* 9.4* 9.4* 9.8* 9.8*  HCT 40.5   < > 28.3* 28.2* 27.7* 29.2* 29.2*  MCV 93.3   < > 93.1 91.6 90.2 91.5 90.1  PLT 251   < > 175 173 198 236 221   < > = values in this interval not displayed.    Basic Metabolic Panel: Recent Labs  Lab 01/15/24 0435 01/15/24 1527 01/16/24 0615 01/17/24 0531 01/18/24 0520 01/19/24 0432 01/20/24 0622  NA 135 132* 131* 132* 134* 135 139  K 3.2* 3.8 3.2* 3.4* 3.5 4.2 4.0  CL 99 99 99 100 101 102 104  CO2 25 23 22  19* 19* 24 20*  GLUCOSE 128* 113* 109* 101* 101* 101* 115*  BUN 23 27* 32* 39* 42* 43* 37*  CREATININE 2.75* 3.25* 4.23* 5.31* 4.86* 4.38* 3.28*  CALCIUM  8.1* 8.0* 7.9* 8.3* 8.0* 8.6* 8.8*  MG  --  1.3*  --   --   --   --   --   PHOS 5.5*  --   --   --   --   --   --     GFR: Estimated Creatinine Clearance: 16 mL/min (A) (by C-G formula based on SCr of 3.28 mg/dL  (H)).  Liver Function Tests: Recent Labs  Lab 01/16/24 0615 01/17/24 0531 01/18/24 0520 01/19/24 0432 01/20/24 0622  AST 238* 109* 84* 101* 88*  ALT 94* 70* 64* 80* 87*  ALKPHOS 67 51 54 55 57  BILITOT 0.3 0.6 0.7 0.7 0.7  PROT 5.1* 4.9* 4.9* 5.5* 5.5*  ALBUMIN 2.9* 2.2* 2.0* 2.3* 2.4*    Cardiac Enzymes: Recent Labs  Lab 01/16/24 0615 01/17/24 0531 01/18/24 0520 01/19/24 0432 01/20/24 0622  CKTOTAL 10,342* 7,898* 5,013* 5,241* 3,193*    CBG: Recent Labs  Lab 01/19/24 0618 01/19/24 1201 01/19/24 1628 01/19/24  2046 01/20/24 0611  GLUCAP 105* 175* 140* 110* 118*     Radiology Studies: No results found.      LOS: 6 days   Mekiyah Gladwell [employer] on [email or web address]  01/20/2024, 10:02 AM

## 2024-01-21 DIAGNOSIS — T796XXA Traumatic ischemia of muscle, initial encounter: Secondary | ICD-10-CM | POA: Diagnosis not present

## 2024-01-21 DIAGNOSIS — R7401 Elevation of levels of liver transaminase levels: Secondary | ICD-10-CM | POA: Diagnosis not present

## 2024-01-21 DIAGNOSIS — N179 Acute kidney failure, unspecified: Secondary | ICD-10-CM | POA: Diagnosis not present

## 2024-01-21 LAB — COMPREHENSIVE METABOLIC PANEL WITH GFR
ALT: 96 U/L — ABNORMAL HIGH (ref 0–44)
AST: 81 U/L — ABNORMAL HIGH (ref 15–41)
Albumin: 2.5 g/dL — ABNORMAL LOW (ref 3.5–5.0)
Alkaline Phosphatase: 56 U/L (ref 38–126)
Anion gap: 11 (ref 5–15)
BUN: 30 mg/dL — ABNORMAL HIGH (ref 8–23)
CO2: 23 mmol/L (ref 22–32)
Calcium: 9 mg/dL (ref 8.9–10.3)
Chloride: 103 mmol/L (ref 98–111)
Creatinine, Ser: 2.64 mg/dL — ABNORMAL HIGH (ref 0.44–1.00)
GFR, Estimated: 18 mL/min — ABNORMAL LOW (ref >60)
Glucose, Bld: 90 mg/dL (ref 70–99)
Potassium: 4 mmol/L (ref 3.5–5.1)
Sodium: 137 mmol/L (ref 135–145)
Total Bilirubin: 0.8 mg/dL (ref 0.0–1.2)
Total Protein: 5.4 g/dL — ABNORMAL LOW (ref 6.5–8.1)

## 2024-01-21 LAB — GLUCOSE, CAPILLARY
Glucose-Capillary: 106 mg/dL — ABNORMAL HIGH (ref 70–99)
Glucose-Capillary: 111 mg/dL — ABNORMAL HIGH (ref 70–99)
Glucose-Capillary: 140 mg/dL — ABNORMAL HIGH (ref 70–99)
Glucose-Capillary: 116 mg/dL — ABNORMAL HIGH (ref 70–99)

## 2024-01-21 LAB — CK: Total CK: 1637 U/L — ABNORMAL HIGH (ref 38–234)

## 2024-01-21 MED ORDER — AMLODIPINE BESYLATE 5 MG PO TABS
5.0000 mg | ORAL_TABLET | Freq: Every day | ORAL | Status: DC
  Administered 2024-01-21 – 2024-01-22 (×2): 5 mg via ORAL
  Filled 2024-01-21 (×2): qty 1

## 2024-01-21 NOTE — Progress Notes (Signed)
 [facility] PROGRESS NOTE   Tammy Mullins FMW:[id number] DOB: Mar 07, 1950 DOA: 01/14/2024  PCP: Wonda Worth SQUIBB, PA  Brief History: 73 y.o. female with PMH of hypertension, hyperlipidemia, bipolar disorder, GAD presenting to the ED after a fall.  Patient had a fall while she was going to the kitchen to get some water.  She thinks she may have had a syncopal episode.  She was on the floor for several hours.  Evaluation in the ED raised concern for rhabdomyolysis.  She was noted to have acute kidney injury.  She was hospitalized for further management.  Consultants: Nephrology has signed off  Procedures: None yet   Subjective/Interval History: Patient continues to feel better.  Occasional pain in the left lower extremity is present.  Occasional pain in the lower back.      Assessment/Plan:  Traumatic rhabdomyolysis secondary to fall at home CK level was greater than 20,000.  Patient was aggressively hydrated.  CK levels gradually trending down.  No need to check CK levels daily.  Acute kidney injury/hypokalemia/hyperphosphatemia/hyponatremia Baseline creatinine is normal.  Came in with a creatinine of 2.44.  Peaked at 5.31. Most likely secondary to rhabdomyolysis.   Nephrology was consulted.  Patient was given IV fluids.  Patient's urine output picked up.   Creatinine is gradually improving.  Creatinine is now down to 2.64. IV fluids have been discontinued.  Electrolytes are stable. Okay to discontinue Foley catheter. Avoid nephrotoxic agents.    Concern for syncope Echocardiogram shows normal LVEF.  Grade 1 diastolic dysfunction noted.  Right ventricular function was normal.  No significant valvular abnormalities noted.  No arrhythmia on telemetry.  No further workup at this time.   Cardiac monitor can be considered in the outpatient setting.  Left lower extremity weakness Most likely due to rhabdomyolysis.  MRI lumbar spine without acute findings.  New PT and OT.  Pain  medications. Reports of imaging studies were reviewed again.  No evidence for any fractures.  Blisters left lower extremity Etiology unclear but could be related to trauma.  Remains stable.  Continue wound care.  Normocytic anemia No evidence for overt bleeding.  Hemoglobin has been stable.  Transaminitis Secondary to rhabdomyolysis.  Hold statin.  LFTs continue to improve.  Hyperlipidemia Holding statin due to abnormal LFTs from rhabdomyolysis.  Essential hypertension Patient was continued on metoprolol .  Okay to resume amlodipine  due to high blood pressure readings.    Constipation Seems to have had bowel movements after she was started on bowel regimen.  Diabetes mellitus type 2, controlled Monitor CBGs. HbA1c 6.3.  Bipolar disorder Stable.  Class I obesity Estimated body mass index is 30.06 kg/m as calculated from the following:   Height as of this encounter: 5' 5.5 (1.664 m).   Weight as of this encounter: 83.2 kg.   DVT Prophylaxis: Subcutaneous heparin  Code Status: Full code Family Communication: Discussed with patient Disposition Plan: SNF is recommended.  Patient should be ready for discharge tomorrow.    Medications: Scheduled:  amLODipine   5 mg Oral Daily   buPROPion   300 mg Oral Daily   Chlorhexidine  Gluconate Cloth  6 each Topical Daily   gabapentin   300 mg Oral QHS   heparin   5,000 Units Subcutaneous Q8H   insulin  aspart  0-9 Units Subcutaneous TID WC   metoprolol   200 mg Oral Daily   pantoprazole   40 mg Oral Daily   polyethylene glycol  17 g Oral BID   QUEtiapine   600 mg Oral QHS   senna-docusate  2 tablet Oral BID   sodium chloride  flush  3 mL Intravenous Q12H   Continuous:   PRN:acetaminophen  **OR** acetaminophen , methocarbamol , oxyCODONE    Objective:  Vital Signs  Vitals:   01/21/24 0348 01/21/24 0701 01/21/24 0803 01/21/24 0916  BP: (!) 167/83  (!) 145/76 (!) 145/76  Pulse: 83  85 85  Resp: 17  16   Temp: 98.1 F (36.7 C)   98.5 F (36.9 C)   TempSrc:   Oral   SpO2: 95%  96%   Weight:  83.2 kg    Height:        Intake/Output Summary (Last 24 hours) at 01/21/2024 1001 Last data filed at 01/21/2024 0900 Gross per 24 hour  Intake 360 ml  Output 1750 ml  Net -1390 ml   Filed Weights   01/19/24 0500 01/20/24 0500 01/21/24 0701  Weight: 85 kg 83.4 kg 83.2 kg    General appearance: Awake alert.  In no distress Resp: Clear to auscultation bilaterally.  Normal effort Cardio: S1-S2 is normal regular.  No S3-S4.  No rubs murmurs or bruit GI: Abdomen is soft.  Nontender nondistended.  Bowel sounds are present normal.  No masses organomegaly   Lab Results:  Data Reviewed: I have personally reviewed following labs and reports of the imaging studies  CBC: Recent Labs  Lab 01/14/24 2006 01/15/24 0435 01/16/24 0615 01/17/24 0531 01/18/24 0520 01/19/24 0432 01/20/24 0622  WBC 8.9   < > 7.7 7.3 9.3 8.0 7.6  NEUTROABS 7.0  --   --   --   --   --   --   HGB 13.1   < > 9.3* 9.4* 9.4* 9.8* 9.8*  HCT 40.5   < > 28.3* 28.2* 27.7* 29.2* 29.2*  MCV 93.3   < > 93.1 91.6 90.2 91.5 90.1  PLT 251   < > 175 173 198 236 221   < > = values in this interval not displayed.    Basic Metabolic Panel: Recent Labs  Lab 01/15/24 0435 01/15/24 1527 01/16/24 0615 01/17/24 0531 01/18/24 0520 01/19/24 0432 01/20/24 0622 01/21/24 0554  NA 135 132*   < > 132* 134* 135 139 137  K 3.2* 3.8   < > 3.4* 3.5 4.2 4.0 4.0  CL 99 99   < > 100 101 102 104 103  CO2 25 23   < > 19* 19* 24 20* 23  GLUCOSE 128* 113*   < > 101* 101* 101* 115* 90  BUN 23 27*   < > 39* 42* 43* 37* 30*  CREATININE 2.75* 3.25*   < > 5.31* 4.86* 4.38* 3.28* 2.64*  CALCIUM  8.1* 8.0*   < > 8.3* 8.0* 8.6* 8.8* 9.0  MG  --  1.3*  --   --   --   --   --   --   PHOS 5.5*  --   --   --   --   --   --   --    < > = values in this interval not displayed.    GFR: Estimated Creatinine Clearance: 19.8 mL/min (A) (by C-G formula based on SCr of 2.64 mg/dL  (H)).  Liver Function Tests: Recent Labs  Lab 01/17/24 0531 01/18/24 0520 01/19/24 0432 01/20/24 0622 01/21/24 0554  AST 109* 84* 101* 88* 81*  ALT 70* 64* 80* 87* 96*  ALKPHOS 51 54 55 57 56  BILITOT 0.6 0.7 0.7 0.7 0.8  PROT 4.9* 4.9* 5.5* 5.5* 5.4*  ALBUMIN  2.2* 2.0* 2.3* 2.4* 2.5*    Cardiac Enzymes: Recent Labs  Lab 01/17/24 0531 01/18/24 0520 01/19/24 0432 01/20/24 0622 01/21/24 0554  CKTOTAL 7,898* 5,013* 5,241* 3,193* 1,637*    CBG: Recent Labs  Lab 01/20/24 0611 01/20/24 1115 01/20/24 1642 01/20/24 2059 01/21/24 0617  GLUCAP 118* 116* 109* 100* 106*     Radiology Studies: No results found.    LOS: 7 days   Rateel Beldin [employer] on [email or web address]  01/21/2024, 10:01 AM

## 2024-01-22 ENCOUNTER — Other Ambulatory Visit: Payer: Self-pay | Admitting: Cardiology

## 2024-01-22 ENCOUNTER — Inpatient Hospital Stay (HOSPITAL_COMMUNITY)

## 2024-01-22 DIAGNOSIS — T796XXA Traumatic ischemia of muscle, initial encounter: Secondary | ICD-10-CM | POA: Diagnosis not present

## 2024-01-22 DIAGNOSIS — R55 Syncope and collapse: Secondary | ICD-10-CM

## 2024-01-22 DIAGNOSIS — M7989 Other specified soft tissue disorders: Secondary | ICD-10-CM

## 2024-01-22 DIAGNOSIS — R52 Pain, unspecified: Secondary | ICD-10-CM

## 2024-01-22 LAB — BASIC METABOLIC PANEL WITH GFR
Anion gap: 13 (ref 5–15)
BUN: 28 mg/dL — ABNORMAL HIGH (ref 8–23)
CO2: 23 mmol/L (ref 22–32)
Calcium: 9 mg/dL (ref 8.9–10.3)
Chloride: 101 mmol/L (ref 98–111)
Creatinine, Ser: 2.36 mg/dL — ABNORMAL HIGH (ref 0.44–1.00)
GFR, Estimated: 21 mL/min — ABNORMAL LOW (ref >60)
Glucose, Bld: 108 mg/dL — ABNORMAL HIGH (ref 70–99)
Potassium: 4.2 mmol/L (ref 3.5–5.1)
Sodium: 137 mmol/L (ref 135–145)

## 2024-01-22 LAB — GLUCOSE, CAPILLARY
Glucose-Capillary: 105 mg/dL — ABNORMAL HIGH (ref 70–99)
Glucose-Capillary: 135 mg/dL — ABNORMAL HIGH (ref 70–99)

## 2024-01-22 MED ORDER — ZONISAMIDE 100 MG PO CAPS: 100.0000 mg | ORAL_CAPSULE | Freq: Every day | ORAL | 0 refills | Status: DC

## 2024-01-22 MED ORDER — GABAPENTIN 600 MG PO TABS: 300.0000 mg | ORAL_TABLET | Freq: Two times a day (BID) | ORAL | 0 refills | Status: AC

## 2024-01-22 MED ORDER — OXYCODONE HCL 5 MG PO TABS: 5.0000 mg | ORAL_TABLET | Freq: Four times a day (QID) | ORAL | 0 refills | Status: AC | PRN

## 2024-01-22 MED ORDER — AMLODIPINE BESYLATE 10 MG PO TABS: 10.0000 mg | ORAL_TABLET | Freq: Every day | ORAL | Status: AC

## 2024-01-22 MED ORDER — QUETIAPINE FUMARATE 300 MG PO TABS: 600.0000 mg | ORAL_TABLET | Freq: Every day | ORAL | 0 refills | Status: AC

## 2024-01-22 MED ORDER — POLYETHYLENE GLYCOL 3350 17 G PO PACK: 17.0000 g | PACK | Freq: Two times a day (BID) | ORAL | Status: AC

## 2024-01-22 MED ORDER — METHOCARBAMOL 500 MG PO TABS: 500.0000 mg | ORAL_TABLET | Freq: Four times a day (QID) | ORAL | Status: AC | PRN

## 2024-01-22 MED ORDER — SENNOSIDES-DOCUSATE SODIUM 8.6-50 MG PO TABS: 2.0000 | ORAL_TABLET | Freq: Two times a day (BID) | ORAL | Status: AC

## 2024-01-22 NOTE — Progress Notes (Signed)
 VASCULAR LAB    Left lower extremity venous duplex has been performed.  See CV proc for preliminary results.   Yamileth Hayse, RVT 01/22/2024, 12:18 PM

## 2024-01-22 NOTE — Progress Notes (Signed)
 Attempted report to [facility].  Nurse put on hold for 10 min with no answer.  Will try to call back.

## 2024-01-22 NOTE — Discharge Summary (Signed)
 [facility]  Physician Discharge Summary   Patient ID: Tammy Mullins MRN: [id number] DOB/AGE: 1950-09-12 73 y.o.  Admit date: 01/14/2024 Discharge date:   01/22/2024   PCP: Tammy Worth SQUIBB, PA  DISCHARGE DIAGNOSES:    Traumatic rhabdomyolysis Acute kidney injury, or improved Left lower extremity pain likely due to rhabdomyolysis    Hyperlipidemia   GAD (generalized anxiety disorder)   Essential hypertension   Diabetes type 2, controlled (HCC)   Bipolar 2 disorder (HCC)   RECOMMENDATIONS FOR OUTPATIENT FOLLOW UP: Check CBC and complete metabolic panel in 3 to 4 days  Resume statin once LFTs have improved to normal levels. Will send message to cardiology to arrange cardiac monitor in the outpatient setting.    Home Health: Going to SNF Equipment/Devices: None  CODE STATUS: Full code  DISCHARGE CONDITION: fair  Diet recommendation: Heart healthy  INITIAL HISTORY: 73 y.o. female with PMH of hypertension, hyperlipidemia, bipolar disorder, GAD presenting to the ED after a fall.  Patient had a fall while she was going to the kitchen to get some water.  She thinks she may have had a syncopal episode.  She was on the floor for several hours.  Evaluation in the ED raised concern for rhabdomyolysis.  She was noted to have acute kidney injury.  She was hospitalized for further management.   Consultants: Nephrology has signed off   Procedures: None   HOSPITAL COURSE:   Traumatic rhabdomyolysis secondary to fall at home CK level was greater than 20,000.  Patient was aggressively hydrated.  CK levels gradually trending down.    Acute kidney injury/hypokalemia/hyperphosphatemia/hyponatremia Baseline creatinine is normal.  Came in with a creatinine of 2.44.  Peaked at 5.31. Most likely secondary to rhabdomyolysis.   Nephrology was consulted.  Patient was given IV fluids.  Patient's urine output picked up.   Creatinine is gradually improving.  Creatinine is now down to  2.3.  Check labs periodically. Foley catheter was discontinued.  Patient has been able to void urine.   Concern for syncope Echocardiogram shows normal LVEF.  Grade 1 diastolic dysfunction noted.  Right ventricular function was normal.  No significant valvular abnormalities noted.  No arrhythmia on telemetry.  No further workup at this time.   Cardiac monitor can be considered in the outpatient setting.   Left lower extremity weakness and pain Most likely due to rhabdomyolysis.  MRI lumbar spine without acute findings.  New PT and OT.  Pain medications. Reports of imaging studies were reviewed again.  No evidence for any fractures. Patient continues to have pain in the left leg.  Some swelling is also noted.  Most likely due to rhabdo.  Imaging studies have been negative.  Doppler study negative for DVT.    Blisters left lower extremity Etiology unclear but could be related to trauma.  Improvement noted.  Continue wound care.   Normocytic anemia No evidence for overt bleeding.  Hemoglobin has been stable.   Transaminitis Secondary to rhabdomyolysis.  Hold statin.  LFTs continue to improve.   Hyperlipidemia Holding statin due to abnormal LFTs from rhabdomyolysis.   Essential hypertension Continue metoprolol .  Amlodipine  dose to be increased.  Monitor closely at SNF.   Constipation Seems to have had bowel movements after she was started on bowel regimen.   Diabetes mellitus type 2, controlled HbA1c 6.3.   Bipolar disorder Stable.   Class I obesity Estimated body mass index is 30.06 kg/m as calculated from the following:   Height as of this encounter: 5'  5.5 (1.664 m).   Weight as of this encounter: 83.2 kg.       Patient is stable.  Hopefully discharge later today if Doppler studies are negative.PERTINENT LABS:  The results of significant diagnostics from this hospitalization (including imaging, microbiology, ancillary and laboratory) are listed below for reference.     Labs:   Basic Metabolic Panel: Recent Labs  Lab 01/15/24 1527 01/16/24 0615 01/18/24 0520 01/19/24 0432 01/20/24 0622 01/21/24 0554 01/22/24 0526  NA 132*   < > 134* 135 139 137 137  K 3.8   < > 3.5 4.2 4.0 4.0 4.2  CL 99   < > 101 102 104 103 101  CO2 23   < > 19* 24 20* 23 23  GLUCOSE 113*   < > 101* 101* 115* 90 108*  BUN 27*   < > 42* 43* 37* 30* 28*  CREATININE 3.25*   < > 4.86* 4.38* 3.28* 2.64* 2.36*  CALCIUM  8.0*   < > 8.0* 8.6* 8.8* 9.0 9.0  MG 1.3*  --   --   --   --   --   --    < > = values in this interval not displayed.   Liver Function Tests: Recent Labs  Lab 01/17/24 0531 01/18/24 0520 01/19/24 0432 01/20/24 0622 01/21/24 0554  AST 109* 84* 101* 88* 81*  ALT 70* 64* 80* 87* 96*  ALKPHOS 51 54 55 57 56  BILITOT 0.6 0.7 0.7 0.7 0.8  PROT 4.9* 4.9* 5.5* 5.5* 5.4*  ALBUMIN 2.2* 2.0* 2.3* 2.4* 2.5*    CBC: Recent Labs  Lab 01/16/24 0615 01/17/24 0531 01/18/24 0520 01/19/24 0432 01/20/24 0622  WBC 7.7 7.3 9.3 8.0 7.6  HGB 9.3* 9.4* 9.4* 9.8* 9.8*  HCT 28.3* 28.2* 27.7* 29.2* 29.2*  MCV 93.1 91.6 90.2 91.5 90.1  PLT 175 173 198 236 221   Cardiac Enzymes: Recent Labs  Lab 01/17/24 0531 01/18/24 0520 01/19/24 0432 01/20/24 0622 01/21/24 0554  CKTOTAL 7,898* 5,013* 5,241* 3,193* 1,637*     CBG: Recent Labs  Lab 01/21/24 1103 01/21/24 1609 01/21/24 2119 01/22/24 0610 01/22/24 1112  GLUCAP 111* 140* 116* 105* 135*     IMAGING STUDIES VAS US  LOWER EXTREMITY VENOUS (DVT) Result Date: 01/22/2024  Lower Venous DVT Study Patient Name:  Tammy Mullins  Date of Exam:   01/22/2024 Medical Rec #: [id number]       Accession #:    [id number] Date of Birth: 29-Oct-1950       Patient Gender: F Patient Age:   84 years Exam Location:  [facility] Procedure:      VAS US  LOWER EXTREMITY VENOUS (DVT) Referring Phys: Tammy Mullins --------------------------------------------------------------------------------  Indications: Pain, Swelling,  and Status post fall. Patient on floor overnight, rhabdomyolysis.  Limitations: Significant tight swelling, bandages covering blisters on legs and poor ultrasound/tissue interface. Comparison Study: No prior study on file Performing Technologist: Tammy Mullins RVS  Examination Guidelines: A complete evaluation includes B-mode imaging, spectral Doppler, color Doppler, and power Doppler as needed of all accessible portions of each vessel. Bilateral testing is considered an integral part of a complete examination. Limited examinations for reoccurring indications may be performed as noted. The reflux portion of the exam is performed with the patient in reverse Trendelenburg.  +-----+---------------+---------+-----------+----------+--------------+ RIGHTCompressibilityPhasicitySpontaneityPropertiesThrombus Aging +-----+---------------+---------+-----------+----------+--------------+ CFV  Full                                                        +-----+---------------+---------+-----------+----------+--------------+  SFJ  Full                                                        +-----+---------------+---------+-----------+----------+--------------+   +---------+---------------+---------+-----------+----------+-------------------+ LEFT     CompressibilityPhasicitySpontaneityPropertiesThrombus Aging      +---------+---------------+---------+-----------+----------+-------------------+ CFV      Full           Yes      Yes                                      +---------+---------------+---------+-----------+----------+-------------------+ SFJ      Full                                                             +---------+---------------+---------+-----------+----------+-------------------+ FV Prox  Full           Yes      No                                       +---------+---------------+---------+-----------+----------+-------------------+ FV Mid   Full           Yes       No                                       +---------+---------------+---------+-----------+----------+-------------------+ FV Distal               Yes      No                   patent by color and                                                       Doppler             +---------+---------------+---------+-----------+----------+-------------------+ PFV                                                   patent by color     +---------+---------------+---------+-----------+----------+-------------------+ POP                     Yes      No                   patent by color and  Doppler             +---------+---------------+---------+-----------+----------+-------------------+ PTV                                                   patent by color     +---------+---------------+---------+-----------+----------+-------------------+ PERO                                                  Not well visualized +---------+---------------+---------+-----------+----------+-------------------+    Summary: RIGHT: - No evidence of common femoral vein obstruction.   LEFT: - There is no evidence of deep vein thrombosis in the lower extremity. However, portions of this examination were limited- see technologist comments above.  - Ultrasound characteristics of enlarged lymph nodes noted in the groin.  *See table(s) above for measurements and observations.    Preliminary    MR LUMBAR SPINE WO CONTRAST Result Date: 01/17/2024 EXAM: MRI LUMBAR SPINE 01/17/2024 03:25:00 PM TECHNIQUE: Multiplanar multisequence MRI of the lumbar spine was performed without the administration of intravenous contrast. COMPARISON: MR Lumbar Spine Without IV Contrast 01/24/2018. CT lumbar spine 03/03/2022. CLINICAL HISTORY: Low back pain, trauma. FINDINGS: BONES AND ALIGNMENT: Posterior instrumented fusion spanning L3 to S1 with bilateral pedicle screws  and vertical interconnecting rods. Interbody spacers are noted at the L3-L4 through L5-S1 levels. Postsurgical changes are present within the midline paraspinal soft tissues of the lower lumbar spine. Lumbar lordosis is maintained. Normal vertebral body heights. Bone marrow signal is mildly heterogeneous. There is no bone marrow edema or evidence of acute fracture. Mild degenerative endplate changes are present at L2-L3. SPINAL CORD: The conus medullaris extends to the L2 level. SOFT TISSUES: Postsurgical changes are present within the midline paraspinal soft tissues of the lower lumbar spine. Mild atrophy of the paraspinal musculature. T12-L1: No significant spinal canal or foraminal stenosis. L1-L2: There is a small disc bulge and mild facet arthrosis. No significant spinal canal or foraminal stenosis. L2-L3: There is mild disc height loss and disc desiccation. A diffuse disc bulge indents the ventral thecal sac and contributes to lateral recess narrowing. Moderate facet arthrosis with bilateral facet effusions. Thickening of the ligamentum flavum. Moderate spinal canal stenosis with crowding of the cauda equina roots. There is mild bilateral foraminal stenosis. The disc bulge and facet osteophytes possibly contact the exiting right L2 nerve root. L3-L4: Postsurgical changes are present. No significant residual spinal canal stenosis. Small right subarticular disc protrusion. No significant foraminal stenosis. L4-L5: Postsurgical changes are present. No significant residual spinal canal stenosis. No significant foraminal stenosis. L5-S1: Postsurgical changes are present. Small disc bulge. No significant spinal canal stenosis. There is moderate foraminal stenosis on the right. IMPRESSION: 1. No acute findings. 2. Posterior instrumented fusion spanning L3 to S1. No significant residual spinal canal stenosis at operative levels. 3. 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 Disc bulge and facet osteophytes may contact the exiting right L2 nerve root. Findings suggestive of adjacent segment disease. 4. Additional degenerative changes as above. Moderate right foraminal stenosis at L5-S1. Electronically signed by: Donnice Mania MD 01/17/2024 04:33 PM EST RP Workstation: [id number]   ECHOCARDIOGRAM COMPLETE Result Date: 01/17/2024  ECHOCARDIOGRAM REPORT   Patient Name:   KELINA BEAUCHAMP Date of Exam: 01/17/2024 Medical Rec #:  [id number]      Height:       65.5 in Accession #:    [id number]     Weight:       196.4 lb Date of Birth:  06-19-1950      BSA:          1.974 m Patient Age:    73 years       BP:           128/68 mmHg Patient Gender: F              HR:           84 bpm. Exam Location:  Inpatient Procedure: 2D Echo, Cardiac Doppler and Color Doppler (Both Spectral and Color            Flow Doppler were utilized during procedure). Indications:    Syncope R55  History:        Patient has no prior history of Echocardiogram examinations.                 Risk Factors:Diabetes.  Sonographer:    Tinnie Gosling RDCS Referring Phys: [id number] RAMESH KC IMPRESSIONS  1. Left ventricular ejection fraction, by estimation, is 60 to 65%. The left ventricle has normal function. The left ventricle has no regional wall motion abnormalities. There is mild concentric left ventricular hypertrophy. Left ventricular diastolic parameters are consistent with Grade I diastolic dysfunction (impaired relaxation).  2. Right ventricular systolic function is normal. The right ventricular size is normal.  3. Left atrial size was mildly dilated.  4. The mitral valve is normal in structure. Trivial mitral valve regurgitation. No evidence of mitral stenosis.  5. The aortic valve is tricuspid. There is mild calcification of the aortic valve. Aortic valve regurgitation is not visualized. Aortic valve sclerosis/calcification is present, without any evidence of aortic stenosis.  6. The  inferior vena cava is normal in size with greater than 50% respiratory variability, suggesting right atrial pressure of 3 mmHg. FINDINGS  Left Ventricle: Left ventricular ejection fraction, by estimation, is 60 to 65%. The left ventricle has normal function. The left ventricle has no regional wall motion abnormalities. The left ventricular internal cavity size was normal in size. There is  mild concentric left ventricular hypertrophy. Left ventricular diastolic parameters are consistent with Grade I diastolic dysfunction (impaired relaxation). Right Ventricle: The right ventricular size is normal. No increase in right ventricular wall thickness. Right ventricular systolic function is normal. Left Atrium: Left atrial size was mildly dilated. Right Atrium: Right atrial size was normal in size. Pericardium: There is no evidence of pericardial effusion. Mitral Valve: The mitral valve is normal in structure. Trivial mitral valve regurgitation. No evidence of mitral valve stenosis. Tricuspid Valve: The tricuspid valve is normal in structure. Tricuspid valve regurgitation is trivial. No evidence of tricuspid stenosis. Aortic Valve: The aortic valve is tricuspid. There is mild calcification of the aortic valve. Aortic valve regurgitation is not visualized. Aortic valve sclerosis/calcification is present, without any evidence of aortic stenosis. Pulmonic Valve: The pulmonic valve was normal in structure. Pulmonic valve regurgitation is trivial. No evidence of pulmonic stenosis. Aorta: The aortic root is normal in size and structure. Venous: The inferior vena cava is normal in size with greater than 50% respiratory variability, suggesting right atrial pressure of 3 mmHg. IAS/Shunts: No atrial level shunt detected by color flow Doppler.  LEFT VENTRICLE  PLAX 2D LVIDd:         3.50 cm   Diastology LVIDs:         2.10 cm   LV e' medial:    6.20 cm/s LV PW:         1.20 cm   LV E/e' medial:  13.9 LV IVS:        1.20 cm   LV e'  lateral:   7.83 cm/s LVOT diam:     2.00 cm   LV E/e' lateral: 11.0 LV SV:         76 LV SV Index:   38 LVOT Area:     3.14 cm LV IVRT:       111 msec  IVC IVC diam: 1.40 cm LEFT ATRIUM             Index        RIGHT ATRIUM           Index LA diam:        3.50 cm 1.77 cm/m   RA Area:     15.10 cm LA Vol (A2C):   62.0 ml 31.40 ml/m  RA Volume:   43.70 ml  22.14 ml/m LA Vol (A4C):   54.4 ml 27.55 ml/m LA Biplane Vol: 61.7 ml 31.25 ml/m  AORTIC VALVE LVOT Vmax:   98.90 cm/s LVOT Vmean:  72.700 cm/s LVOT VTI:    0.241 m  AORTA Ao Root diam: 2.50 cm Ao Asc diam:  2.70 cm MITRAL VALVE                TRICUSPID VALVE MV Area (PHT): 4.15 cm     TR Peak grad:   33.4 mmHg MV Decel Time: 183 msec     TR Vmax:        289.00 cm/s MV E velocity: 85.90 cm/s MV A velocity: 146.00 cm/s  SHUNTS MV E/A ratio:  0.59         Systemic VTI:  0.24 m                             Systemic Diam: 2.00 cm Toribio Fuel MD Electronically signed by Toribio Fuel MD Signature Date/Time: 01/17/2024/3:28:15 PM    Final    US  RENAL Result Date: 01/15/2024 CLINICAL DATA:  AKI. EXAM: RENAL / URINARY TRACT ULTRASOUND COMPLETE COMPARISON:  None Available. FINDINGS: Right Kidney: Renal measurements: 10.5 x 4.5 x 5.1 cm = volume: 124.4 mL. Echogenicity within normal limits. No mass or hydronephrosis visualized. Left Kidney: Renal measurements: 10.1 x 6.0 x 4.3 cm = volume: 134.7 mL. Echogenicity within normal limits. No mass or hydronephrosis visualized. Bladder: Appears normal for degree of bladder distention. Bilateral jets not visualized on this exam, although, evaluation is limited due to obscuration from overlying bowel-gas soft tissues. Other: None. IMPRESSION: No specific abnormality identified. Electronically Signed   By: Harrietta Sherry M.D.   On: 01/15/2024 10:26   DG Chest Port 1 View Result Date: 01/15/2024 EXAM: 1 VIEW(S) XRAY OF THE CHEST 01/15/2024 07:54:00 AM COMPARISON: 01/25/2019 CLINICAL HISTORY: Shortness of breath.  FINDINGS: LUNGS AND PLEURA: Asymmetric elevation of the right hemidiaphragm, mild and new from previous exam. Opacity within the right lower lung is noted, which may reflect atelectasis or airspace disease. No pleural effusion. No pneumothorax. HEART AND MEDIASTINUM: Mild cardiac enlargement. BONES AND SOFT TISSUES: No acute osseous abnormality. IMPRESSION: 1. Right lower lung opacity, which may reflecta airspace disease  versus atelectasis. Electronically signed by: Waddell Calk MD 01/15/2024 08:00 AM EST RP Workstation: [id number]   DG Knee Left Port Result Date: 01/14/2024 EXAM: 4 VIEW(S) XRAY OF THE LEFT KNEE 01/14/2024 07:10:00 PM COMPARISON: None available. CLINICAL HISTORY: 809823 Fall 190176 FINDINGS: BONES AND JOINTS: No acute fracture. No focal osseous lesion. No joint dislocation. No significant joint effusion. No significant degenerative changes. SOFT TISSUES: The soft tissues are unremarkable. IMPRESSION: 1. No acute fracture or dislocation. Electronically signed by: Lonni Necessary MD 01/14/2024 07:35 PM EST RP Workstation: [id number]   DG Tibia/Fibula Left Port Result Date: 01/14/2024 EXAM: 2 VIEWS XRAY OF THE LEFT TIBIA AND FIBULA 01/14/2024 07:10:00 PM COMPARISON: None available. CLINICAL HISTORY: 809823 Fall 190176 809823 Fall 809823 FINDINGS: BONES AND JOINTS: No acute fracture. No focal osseous lesion. No joint dislocation. SOFT TISSUES: The soft tissues are unremarkable. IMPRESSION: 1. No significant abnormality. Electronically signed by: Lonni Necessary MD 01/14/2024 07:34 PM EST RP Workstation: [id number]   DG Hip Unilat With Pelvis 2-3 Views Left Result Date: 01/14/2024 EXAM: 3 VIEW(S) XRAY OF THE LEFT HIP 01/14/2024 07:10:00 PM COMPARISON: None available. CLINICAL HISTORY: Fall. Left lower extremity pain. FINDINGS: BONES AND JOINTS: No acute fracture or focal osseous lesion. The hip joint is maintained. No significant degenerative changes. LUMBAR SPINE: Lower lumbar  fusion extends to the S1 level. SOFT TISSUES: The soft tissues are unremarkable. IMPRESSION: 1. No acute fracture or dislocation of the left hip or visualized pelvis. Electronically signed by: Lonni Necessary MD 01/14/2024 07:34 PM EST RP Workstation: [id number]   CT Cervical Spine Wo Contrast Result Date: 01/14/2024 EXAM: CT CERVICAL SPINE WITHOUT CONTRAST 01/14/2024 06:50:17 PM TECHNIQUE: CT of the cervical spine was performed without the administration of intravenous contrast. Multiplanar reformatted images are provided for review. Automated exposure control, iterative reconstruction, and/or weight based adjustment of the mA/kV was utilized to reduce the radiation dose to as low as reasonably achievable. COMPARISON: CT of the cervical spine 10/13/2017. CLINICAL HISTORY: Neck trauma (Age >= 65y). Patient reports a fall last night with left lower extremity pain. FINDINGS: CERVICAL SPINE: BONES AND ALIGNMENT: No acute fracture or traumatic malalignment. Straightening of the normal cervical lordosis is similar to the prior study. DEGENERATIVE CHANGES: Solid osteophyte formation is present at C3-C4. Uncovertebral and facet spurring result in progressive moderate foraminal narrowing bilaterally at C6-C7. SOFT TISSUES: No prevertebral soft tissue swelling. IMPRESSION: 1. No acute abnormality of the cervical spine related to the reported fall. 2. Progressive moderate foraminal narrowing bilaterally at C6-7 due to uncovertebral and facet spurring. Electronically signed by: Lonni Necessary MD 01/14/2024 06:57 PM EST RP Workstation: [id number]   CT Head Wo Contrast Result Date: 01/14/2024 EXAM: CT HEAD WITHOUT CONTRAST 01/14/2024 06:50:17 PM TECHNIQUE: CT of the head was performed without the administration of intravenous contrast. Automated exposure control, iterative reconstruction, and/or weight based adjustment of the mA/kV was utilized to reduce the radiation dose to as low as reasonably achievable.  COMPARISON: None available. CLINICAL HISTORY: Head trauma, minor (Age >= 65y). Patient reports fall last evening with left lower extremity pain. FINDINGS: BRAIN AND VENTRICLES: No acute hemorrhage. No evidence of acute infarct. No hydrocephalus. No extra-axial collection. No mass effect or midline shift. Scattered areas of ossification are present along the dura including the tentorium. Atherosclerotic calcifications are present in the cavernous carotid arteries bilaterally. No hyperdense vessel is present. ORBITS: No acute abnormality. SINUSES: No acute abnormality. SOFT TISSUES AND SKULL: No acute soft tissue abnormality. No skull fracture. IMPRESSION: 1. No acute intracranial abnormality  related to the reported head trauma. Electronically signed by: Lonni Necessary MD 01/14/2024 06:55 PM EST RP Workstation: [id number]    DISCHARGE EXAMINATION: Vitals:   01/21/24 0916 01/21/24 1941 01/22/24 0328 01/22/24 0748  BP: (!) 145/76 (!) 145/74 (!) 141/65 (!) 167/77  Pulse: 85 78 82 87  Resp:    17  Temp:  98.1 F (36.7 C) 98.6 F (37 C) 98.5 F (36.9 C)  TempSrc:      SpO2:  96% 93% 94%  Weight:      Height:       General appearance: Awake alert.  In no distress Resp: Clear to auscultation bilaterally.  Normal effort Cardio: S1-S2 is normal regular.  No S3-S4.  No rubs murmurs or bruit GI: Abdomen is soft.  Nontender nondistended.  Bowel sounds are present normal.  No masses organomegaly Extremities: Improved range of motion of the left leg though some swelling is noted.  Pain is present.   DISPOSITION: SNF  Discharge Instructions     Call MD for:  difficulty breathing, headache or visual disturbances   Complete by: As directed    Call MD for:  extreme fatigue   Complete by: As directed    Call MD for:  persistant dizziness or light-headedness   Complete by: As directed    Call MD for:  persistant nausea and vomiting   Complete by: As directed    Call MD for:  severe uncontrolled  pain   Complete by: As directed    Call MD for:  temperature >100.4   Complete by: As directed    Diet - low sodium heart healthy   Complete by: As directed    Discharge instructions   Complete by: As directed    Please review instructions on the discharge summary.  You were cared for by a hospitalist during your hospital stay. If you have any questions about your discharge medications or the care you received while you were in the hospital after you are discharged, you can call the unit and asked to speak with the hospitalist on call if the hospitalist that took care of you is not available. Once you are discharged, your primary care physician will handle any further medical issues. Please note that NO REFILLS for any discharge medications will be authorized once you are discharged, as it is imperative that you return to your primary care physician (or establish a relationship with a primary care physician if you do not have one) for your aftercare needs so that they can reassess your need for medications and monitor your lab values. If you do not have a primary care physician, you can call [phone] for a physician referral.   Discharge wound care:   Complete by: As directed    Blisters/wound over left lower extremity: Wound care  Every 12 hours    Comments: Cleanse wound with normal saline, pat dry with guaze and apply 2 layers of Xeroform yellow guaze over wound, cover with 4x4 Mepilex foam dressing or if foam is not optimal can use rolled Kerlix gauze, secure in place with tape. Off-load heels onto pillows at all times.   Increase activity slowly   Complete by: As directed          Allergies as of 01/22/2024       Reactions   Crestor  [rosuvastatin  Calcium ] Other (See Comments)   Causes severe body aches   Dicyclomine  Other (See Comments)   Body and face rash, swelling.   Topiramate  Diarrhea  Asa [aspirin ] Other (See Comments)   GI upset in hight doses can take a 81mg    Celebrex  [celecoxib] Swelling, Rash   Swelling in legs        Medication List     STOP taking these medications    diphenoxylate-atropine 2.5-0.025 MG tablet Commonly known as: LOMOTIL   hydrochlorothiazide  50 MG tablet Commonly known as: HYDRODIURIL    losartan  100 MG tablet Commonly known as: COZAAR    zonisamide  100 MG capsule Commonly known as: ZONEGRAN        TAKE these medications    allopurinol  300 MG tablet Commonly known as: ZYLOPRIM  TAKE 1 AND 1/2 TABLETS(450 MG) BY MOUTH DAILY   amLODipine  10 MG tablet Commonly known as: NORVASC  Take 1 tablet (10 mg total) by mouth daily. TAKE 1 TABLET DAILY What changed:  how much to take how to take this when to take this   buPROPion  150 MG 24 hr tablet Commonly known as: WELLBUTRIN  XL Take 3 tablets (450 mg total) by mouth daily.   DULoxetine  60 MG capsule Commonly known as: CYMBALTA  Take 2 capsules (120 mg total) by mouth daily.   famotidine 40 MG tablet Commonly known as: PEPCID Take 40 mg by mouth daily.   ferrous sulfate  325 (65 FE) MG tablet Take 325 mg by mouth daily with breakfast.   gabapentin  600 MG tablet Commonly known as: NEURONTIN  Take 0.5 tablets (300 mg total) by mouth 2 (two) times daily. What changed:  how much to take when to take this   hyoscyamine  0.125 MG SL tablet Commonly known as: LEVSIN  SL SMARTSIG:1-2 Tablet(s) Sublingual Every 4-6 Hours PRN   methocarbamol  500 MG tablet Commonly known as: ROBAXIN  Take 1 tablet (500 mg total) by mouth every 6 (six) hours as needed for muscle spasms. What changed: See the new instructions.   metoprolol  200 MG 24 hr tablet Commonly known as: TOPROL -XL Take 200 mg by mouth daily.   omeprazole  40 MG capsule Commonly known as: PRILOSEC Take 1 capsule (40 mg total) by mouth in the morning and at bedtime.   onetouch ultrasoft lancets Use as instructed   OneTouch Verio Flex System w/Device Kit Use as directed   OneTouch Verio test strip Generic  drug: glucose blood Use as instructed   oxyCODONE  5 MG immediate release tablet Commonly known as: Oxy IR/ROXICODONE  Take 1 tablet (5 mg total) by mouth every 6 (six) hours as needed for moderate pain (pain score 4-6).   polyethylene glycol 17 g packet Commonly known as: MIRALAX  / GLYCOLAX  Take 17 g by mouth 2 (two) times daily.   QUEtiapine  300 MG tablet Commonly known as: SEROQUEL  Take 2 tablets (600 mg total) by mouth at bedtime.   senna-docusate 8.6-50 MG tablet Commonly known as: Senokot-S Take 2 tablets by mouth 2 (two) times daily.               Discharge Care Instructions  (From admission, onward)           Start     Ordered   01/22/24 0000  Discharge wound care:       Comments: Blisters/wound over left lower extremity: Wound care  Every 12 hours    Comments: Cleanse wound with normal saline, pat dry with guaze and apply 2 layers of Xeroform yellow guaze over wound, cover with 4x4 Mepilex foam dressing or if foam is not optimal can use rolled Kerlix gauze, secure in place with tape. Off-load heels onto pillows at all times.   01/22/24 [phone]  Contact information for after-discharge care     Destination     [facility] .   Service: Skilled Nursing Contact information: [address] [place]  [address] [phone]                     TOTAL DISCHARGE TIME: 35 minutes  Delsin Copen Verdene  [facility] Pager on [email or web address]  01/22/2024, 12:34 PM

## 2024-01-22 NOTE — TOC Transition Note (Signed)
 Transition of Care [facility]) - Discharge Note   Patient Details  Name: Tammy Mullins MRN: [id number] Date of Birth: 1950/09/03  [facility] CM/SW Contact:  Bridget Cordella Simmonds, LCSW Phone Number: 01/22/2024, 1:28 PM   Clinical Narrative:   Pt discharging to [facility], room 103.  RN call report to [phone].  PTAR called [phone].     0845: CSW confirmed with Nikki/[facility] that they can receive pt today.     Final next level of care: Skilled Nursing Facility Barriers to Discharge: Barriers Resolved   Patient Goals and CMS Choice Patient states their goals for this hospitalization and ongoing recovery are:: get back to regular schedule          Discharge Placement              Patient chooses bed at: [facility] Patient to be transferred to facility by: ptar Name of family member notified: left message with niece Shaunice Patient and family notified of of transfer: 01/22/24  Discharge Plan and Services Additional resources added to the After Visit Summary for   In-house Referral: Clinical Social Work   Post Acute Care Choice: Skilled Nursing Facility                               Social Drivers of Health (SDOH) Interventions SDOH Screenings   Food Insecurity: No Food Insecurity (01/15/2024)  Housing: Low Risk  (01/15/2024)  Transportation Needs: No Transportation Needs (01/15/2024)  Utilities: Not At Risk (01/15/2024)  Alcohol Screen: Low Risk  (07/11/2022)  Physical Activity: Inactive (07/11/2022)  Social Connections: Unknown (01/15/2024)  Tobacco Use: Low Risk  (01/15/2024)     Readmission Risk Interventions    01/16/2024    1:14 PM  Readmission Risk Prevention Plan  Transportation Screening Complete  PCP or Specialist Appt within 5-7 Days Complete  Home Care Screening Complete  Medication Review (RN CM) Complete

## 2024-01-22 NOTE — Progress Notes (Signed)
 Physical Therapy Treatment Patient Details Name: Tammy Mullins MRN: [id number] DOB: 14-Nov-1950 Today's Date: 01/22/2024   History of Present Illness Tammy Mullins is a 73 y.o. year old female who presented 11/16 s/p fall hitting L side of head, CT spine, hip and head with no acute findings, dx with Traumatic rhabdomyolysis; placed on 3 L O2 in ED at [facility]. Transferred to [facility] 11/18. PMH:  hypertension, hyperlipidemia, bipolar disorder, ADD, Barrett's esophagus, DM2, fibromyalgia, gout, arthritis, aortic atherosclerosis, IBS, myalgia and myositis    PT Comments  Pt much improved from both cognitive and functional standpoint this PT session. Pt able to amb 100' with RW and minA this date. Pt reports Ive been rubbing and moving my legs and they feel so much better. Pt appreciative of services. Pt continues to require assist for ADLs, transfers, ambulation, and demo's impaired cognition. Pt to benefit from inpatient rehab program < 3 hrs a day to address above deficits and achieve safe mod I level of function for safe transition home.    If plan is discharge home, recommend the following: Two people to help with walking and/or transfers;A lot of help with bathing/dressing/bathroom;Assist for transportation;Help with stairs or ramp for entrance;Assistance with cooking/housework;Direct supervision/assist for medications management;Direct supervision/assist for financial management   Can travel by private vehicle     No  Equipment Recommendations  BSC/3in1;Wheelchair (measurements PT);Wheelchair cushion (measurements PT);Hospital bed    Recommendations for Other Services       Precautions / Restrictions Precautions Precautions: Fall;Other (comment) Recall of Precautions/Restrictions: Intact Precaution/Restrictions Comments: L LE blisters Restrictions Weight Bearing Restrictions Per Provider Order: No     Mobility  Bed Mobility               General bed mobility comments: pt received  sitting on BSC    Transfers Overall transfer level: Needs assistance Equipment used: Rolling walker (2 wheels), Ambulation equipment used Transfers: Bed to chair/wheelchair/BSC Sit to Stand: Min assist Stand pivot transfers: Min assist         General transfer comment: verbal cues to push up from BSC/bed, increased time, minA to steady during transition of hands, better power up with LEs this date. completed 3 sit to stand transfers, minA for walker management during step pvt transfer back to EOB    Ambulation/Gait Ambulation/Gait assistance: Min assist, +2 safety/equipment (rehab tech for chair follow) Gait Distance (Feet): 100 Feet Assistive device: Rolling walker (2 wheels) Gait Pattern/deviations: Step-through pattern, Decreased stride length Gait velocity: dec Gait velocity interpretation: 1.31 - 2.62 ft/sec, indicative of limited community ambulator   General Gait Details: pt with noted L LE antalgia with decreased WBing tolerance compared to R LE. verbal cues to stay in walker, short step length, near step to pattern   Stairs             Wheelchair Mobility     Tilt Bed    Modified Rankin (Stroke Patients Only)       Balance Overall balance assessment: Needs assistance Sitting-balance support: No upper extremity supported, Feet supported Sitting balance-Leahy Scale: Fair Sitting balance - Comments: static sitting EOB with CGA for safety   Standing balance support: Bilateral upper extremity supported, During functional activity Standing balance-Leahy Scale: Poor Standing balance comment: reliant on mod-maxA and bil UE support                            Communication Communication Communication: No apparent difficulties  Cognition Arousal: Alert  Behavior During Therapy: WFL for tasks assessed/performed   PT - Cognitive impairments: Safety/Judgement                       PT - Cognition Comments: pt much improved, pt continues  with decreased insight to safety but aware she is mobilizing much better but not safe to get up on own however does initiate movement setting bed alarm off Following commands: Impaired Following commands impaired: Only follows one step commands consistently, Follows one step commands with increased time    Cueing Cueing Techniques: Verbal cues, Tactile cues, Visual cues  Exercises      General Comments General comments (skin integrity, edema, etc.): LEs with dressings. Pt dependent for hygiene s/p BM in standing      Pertinent Vitals/Pain Pain Assessment Pain Assessment: Faces Faces Pain Scale: Hurts a little bit Pain Location: L LE Pain Descriptors / Indicators: Discomfort, Grimacing, Guarding, Sore    Home Living                          Prior Function            PT Goals (current goals can now be found in the care plan section) Acute Rehab PT Goals Patient Stated Goal: rehab then home PT Goal Formulation: With patient/family Time For Goal Achievement: 01/29/24 Potential to Achieve Goals: Fair Progress towards PT goals: Progressing toward goals    Frequency    Min 2X/week      PT Plan      Co-evaluation              AM-PAC PT 6 Clicks Mobility   Outcome Measure  Help needed turning from your back to your side while in a flat bed without using bedrails?: A Little Help needed moving from lying on your back to sitting on the side of a flat bed without using bedrails?: A Lot Help needed moving to and from a bed to a chair (including a wheelchair)?: A Lot Help needed standing up from a chair using your arms (e.g., wheelchair or bedside chair)?: A Lot Help needed to walk in hospital room?: A Lot Help needed climbing 3-5 steps with a railing? : A Lot 6 Click Score: 13    End of Session Equipment Utilized During Treatment: Gait belt Activity Tolerance: Patient tolerated treatment well Patient left: in chair;with call bell/phone within reach;with  chair alarm set Nurse Communication: Mobility status PT Visit Diagnosis: Unsteadiness on feet (R26.81);Muscle weakness (generalized) (M62.81);History of falling (Z91.81);Other abnormalities of gait and mobility (R26.89);Difficulty in walking, not elsewhere classified (R26.2)     Time: 9067-9050 PT Time Calculation (min) (ACUTE ONLY): 17 min  Charges:    $Gait Training: 8-22 mins PT General Charges $$ ACUTE PT VISIT: 1 Visit                     Norene Ames, PT, DPT Acute Rehabilitation Services Secure chat preferred Office #: [phone]    Norene CHRISTELLA Ames 01/22/2024, 12:23 PM

## 2024-01-22 NOTE — Progress Notes (Signed)
 Ordering 30 day monitor for syncope. Results to Dr. Raford

## 2024-02-06 ENCOUNTER — Ambulatory Visit: Admitting: Physician Assistant

## 2024-02-08 ENCOUNTER — Ambulatory Visit: Admitting: Neurology

## 2024-02-13 ENCOUNTER — Inpatient Hospital Stay (HOSPITAL_COMMUNITY)
Admission: EM | Admit: 2024-02-13 | Discharge: 2024-02-16 | DRG: 871 | Disposition: A | Discharge status: Home Health | Attending: Internal Medicine | Admitting: Internal Medicine

## 2024-02-13 ENCOUNTER — Encounter (HOSPITAL_COMMUNITY): Payer: Self-pay

## 2024-02-13 ENCOUNTER — Other Ambulatory Visit: Payer: Self-pay

## 2024-02-13 ENCOUNTER — Emergency Department (HOSPITAL_COMMUNITY)

## 2024-02-13 DIAGNOSIS — Z886 Allergy status to analgesic agent status: Secondary | ICD-10-CM

## 2024-02-13 DIAGNOSIS — I10 Essential (primary) hypertension: Secondary | ICD-10-CM | POA: Diagnosis present

## 2024-02-13 DIAGNOSIS — J189 Pneumonia, unspecified organism: Secondary | ICD-10-CM | POA: Diagnosis present

## 2024-02-13 DIAGNOSIS — Z1152 Encounter for screening for COVID-19: Secondary | ICD-10-CM

## 2024-02-13 DIAGNOSIS — E119 Type 2 diabetes mellitus without complications: Secondary | ICD-10-CM | POA: Diagnosis present

## 2024-02-13 DIAGNOSIS — Z981 Arthrodesis status: Secondary | ICD-10-CM

## 2024-02-13 DIAGNOSIS — I440 Atrioventricular block, first degree: Secondary | ICD-10-CM | POA: Diagnosis present

## 2024-02-13 DIAGNOSIS — Z8042 Family history of malignant neoplasm of prostate: Secondary | ICD-10-CM

## 2024-02-13 DIAGNOSIS — M797 Fibromyalgia: Secondary | ICD-10-CM | POA: Diagnosis present

## 2024-02-13 DIAGNOSIS — A419 Sepsis, unspecified organism: Principal | ICD-10-CM | POA: Diagnosis present

## 2024-02-13 DIAGNOSIS — E876 Hypokalemia: Secondary | ICD-10-CM | POA: Diagnosis present

## 2024-02-13 DIAGNOSIS — Z8249 Family history of ischemic heart disease and other diseases of the circulatory system: Secondary | ICD-10-CM

## 2024-02-13 DIAGNOSIS — I3139 Other pericardial effusion (noninflammatory): Secondary | ICD-10-CM | POA: Diagnosis present

## 2024-02-13 DIAGNOSIS — R5381 Other malaise: Secondary | ICD-10-CM | POA: Diagnosis present

## 2024-02-13 DIAGNOSIS — Z96619 Presence of unspecified artificial shoulder joint: Secondary | ICD-10-CM | POA: Diagnosis present

## 2024-02-13 DIAGNOSIS — K2270 Barrett's esophagus without dysplasia: Secondary | ICD-10-CM | POA: Diagnosis present

## 2024-02-13 DIAGNOSIS — Z8679 Personal history of other diseases of the circulatory system: Secondary | ICD-10-CM

## 2024-02-13 DIAGNOSIS — G894 Chronic pain syndrome: Secondary | ICD-10-CM | POA: Diagnosis present

## 2024-02-13 DIAGNOSIS — F319 Bipolar disorder, unspecified: Secondary | ICD-10-CM | POA: Diagnosis present

## 2024-02-13 DIAGNOSIS — Z888 Allergy status to other drugs, medicaments and biological substances status: Secondary | ICD-10-CM

## 2024-02-13 DIAGNOSIS — Z9049 Acquired absence of other specified parts of digestive tract: Secondary | ICD-10-CM

## 2024-02-13 DIAGNOSIS — Z83511 Family history of glaucoma: Secondary | ICD-10-CM

## 2024-02-13 DIAGNOSIS — Z79899 Other long term (current) drug therapy: Secondary | ICD-10-CM

## 2024-02-13 DIAGNOSIS — E7800 Pure hypercholesterolemia, unspecified: Secondary | ICD-10-CM | POA: Diagnosis present

## 2024-02-13 DIAGNOSIS — K5900 Constipation, unspecified: Secondary | ICD-10-CM | POA: Diagnosis present

## 2024-02-13 LAB — D-DIMER, QUANTITATIVE: D-Dimer, Quant: 2.55 ug{FEU}/mL — ABNORMAL HIGH (ref 0.00–0.50)

## 2024-02-13 LAB — CBC
HCT: 34.8 % — ABNORMAL LOW (ref 36.0–46.0)
Hemoglobin: 11.1 g/dL — ABNORMAL LOW (ref 12.0–15.0)
MCH: 31.8 pg (ref 26.0–34.0)
MCHC: 31.9 g/dL (ref 30.0–36.0)
MCV: 99.7 fL (ref 80.0–100.0)
Platelets: 291 K/uL (ref 150–400)
RBC: 3.49 MIL/uL — ABNORMAL LOW (ref 3.87–5.11)
RDW: 15.5 % (ref 11.5–15.5)
WBC: 6.6 K/uL (ref 4.0–10.5)
nRBC: 0 % (ref 0.0–0.2)

## 2024-02-13 LAB — BASIC METABOLIC PANEL WITH GFR
Anion gap: 13 (ref 5–15)
BUN: 10 mg/dL (ref 8–23)
CO2: 25 mmol/L (ref 22–32)
Calcium: 10.3 mg/dL (ref 8.9–10.3)
Chloride: 102 mmol/L (ref 98–111)
Creatinine, Ser: 0.99 mg/dL (ref 0.44–1.00)
GFR, Estimated: 59 mL/min — ABNORMAL LOW (ref >60)
Glucose, Bld: 110 mg/dL — ABNORMAL HIGH (ref 70–99)
Potassium: 3 mmol/L — ABNORMAL LOW (ref 3.5–5.1)
Sodium: 140 mmol/L (ref 135–145)

## 2024-02-13 LAB — CK: Total CK: 492 U/L — ABNORMAL HIGH (ref 38–234)

## 2024-02-13 LAB — PRO BRAIN NATRIURETIC PEPTIDE: Pro Brain Natriuretic Peptide: 112 pg/mL (ref <300.0)

## 2024-02-13 LAB — TROPONIN T, HIGH SENSITIVITY: Troponin T High Sensitivity: 63 ng/L — ABNORMAL HIGH (ref 0–19)

## 2024-02-13 MED ORDER — IOHEXOL 350 MG/ML SOLN
75.0000 mL | Freq: Once | INTRAVENOUS | Status: AC | PRN
  Administered 2024-02-13: 22:00:00 75 mL via INTRAVENOUS

## 2024-02-13 NOTE — ED Triage Notes (Signed)
 Quick triage note: Pt to ED from PCP . Reports was at PCP today for well visit and was noted to have abnormal EKG and recommended to come to ED for further evaluation. Pt currently voicing no complaints.

## 2024-02-13 NOTE — ED Notes (Signed)
 CT called to notify pt has IV and new scan orders.

## 2024-02-13 NOTE — ED Provider Triage Note (Signed)
 Emergency Medicine Provider Triage Evaluation Note  Tammy Mullins , a 73 y.o. female  was evaluated in triage.  Pt complains of abnormal EKG at PCP appointment today.  Patient was told by her PCP that her heart rate was elevated and this was concerning, thus he instructed her to come to the emergency department.  Patient has a history of a recent hospitalization after a fall resulting in rhabdomyolysis, she was then discharged to a rehab facility and has since been discharged home.  She denies chest pain, shortness of breath, lower extremity edema.  No known history of DVT/PE.  Review of Systems  Positive: As above Negative: As above  Physical Exam  BP (!) 166/93 (BP Location: Right Arm)   Pulse (!) 138   Temp 97.9 F (36.6 C)   Resp 17   Ht 5' 5 (1.651 m)   Wt 77.1 kg   SpO2 99%   BMI 28.29 kg/m  Gen:   Awake, no distress   Resp:  Normal effort  MSK:   Moves extremities without difficulty  Other:  Tachycardic  Medical Decision Making  Medically screening exam initiated at 6:52 PM.  Appropriate orders placed.  Tammy Mullins was informed that the remainder of the evaluation will be completed by another provider, this initial triage assessment does not replace that evaluation, and the importance of remaining in the ED until their evaluation is complete.     Tammy Mullins, [place] 02/13/24 [phone]

## 2024-02-14 ENCOUNTER — Emergency Department (HOSPITAL_COMMUNITY)

## 2024-02-14 DIAGNOSIS — J189 Pneumonia, unspecified organism: Secondary | ICD-10-CM | POA: Diagnosis present

## 2024-02-14 DIAGNOSIS — Z8042 Family history of malignant neoplasm of prostate: Secondary | ICD-10-CM | POA: Diagnosis not present

## 2024-02-14 DIAGNOSIS — Z8249 Family history of ischemic heart disease and other diseases of the circulatory system: Secondary | ICD-10-CM | POA: Diagnosis not present

## 2024-02-14 DIAGNOSIS — Z888 Allergy status to other drugs, medicaments and biological substances status: Secondary | ICD-10-CM | POA: Diagnosis not present

## 2024-02-14 DIAGNOSIS — I3139 Other pericardial effusion (noninflammatory): Secondary | ICD-10-CM | POA: Diagnosis present

## 2024-02-14 DIAGNOSIS — E876 Hypokalemia: Secondary | ICD-10-CM | POA: Diagnosis present

## 2024-02-14 DIAGNOSIS — G894 Chronic pain syndrome: Secondary | ICD-10-CM | POA: Diagnosis present

## 2024-02-14 DIAGNOSIS — Z96619 Presence of unspecified artificial shoulder joint: Secondary | ICD-10-CM | POA: Diagnosis present

## 2024-02-14 DIAGNOSIS — Z9049 Acquired absence of other specified parts of digestive tract: Secondary | ICD-10-CM | POA: Diagnosis not present

## 2024-02-14 DIAGNOSIS — F319 Bipolar disorder, unspecified: Secondary | ICD-10-CM | POA: Diagnosis present

## 2024-02-14 DIAGNOSIS — A419 Sepsis, unspecified organism: Secondary | ICD-10-CM

## 2024-02-14 DIAGNOSIS — Z79899 Other long term (current) drug therapy: Secondary | ICD-10-CM | POA: Diagnosis not present

## 2024-02-14 DIAGNOSIS — I10 Essential (primary) hypertension: Secondary | ICD-10-CM | POA: Diagnosis present

## 2024-02-14 DIAGNOSIS — E7800 Pure hypercholesterolemia, unspecified: Secondary | ICD-10-CM | POA: Diagnosis present

## 2024-02-14 DIAGNOSIS — Z981 Arthrodesis status: Secondary | ICD-10-CM | POA: Diagnosis not present

## 2024-02-14 DIAGNOSIS — Z8679 Personal history of other diseases of the circulatory system: Secondary | ICD-10-CM | POA: Diagnosis not present

## 2024-02-14 DIAGNOSIS — Z886 Allergy status to analgesic agent status: Secondary | ICD-10-CM | POA: Diagnosis not present

## 2024-02-14 DIAGNOSIS — M797 Fibromyalgia: Secondary | ICD-10-CM | POA: Diagnosis present

## 2024-02-14 DIAGNOSIS — Z83511 Family history of glaucoma: Secondary | ICD-10-CM | POA: Diagnosis not present

## 2024-02-14 DIAGNOSIS — I440 Atrioventricular block, first degree: Secondary | ICD-10-CM | POA: Diagnosis present

## 2024-02-14 DIAGNOSIS — K5900 Constipation, unspecified: Secondary | ICD-10-CM | POA: Diagnosis present

## 2024-02-14 DIAGNOSIS — Z1152 Encounter for screening for COVID-19: Secondary | ICD-10-CM | POA: Diagnosis not present

## 2024-02-14 DIAGNOSIS — E119 Type 2 diabetes mellitus without complications: Secondary | ICD-10-CM | POA: Diagnosis present

## 2024-02-14 DIAGNOSIS — K2270 Barrett's esophagus without dysplasia: Secondary | ICD-10-CM | POA: Diagnosis present

## 2024-02-14 LAB — URINALYSIS, W/ REFLEX TO CULTURE (INFECTION SUSPECTED)
Bacteria, UA: NONE SEEN
Bilirubin Urine: NEGATIVE
Glucose, UA: NEGATIVE mg/dL
Hgb urine dipstick: NEGATIVE
Ketones, ur: 20 mg/dL — AB
Leukocytes,Ua: NEGATIVE
Nitrite: NEGATIVE
Protein, ur: NEGATIVE mg/dL
Specific Gravity, Urine: 1.019 (ref 1.005–1.030)
pH: 5 (ref 5.0–8.0)

## 2024-02-14 LAB — COMPREHENSIVE METABOLIC PANEL WITH GFR
ALT: 20 U/L (ref 0–44)
AST: 29 U/L (ref 15–41)
Albumin: 4.6 g/dL (ref 3.5–5.0)
Alkaline Phosphatase: 108 U/L (ref 38–126)
Anion gap: 15 (ref 5–15)
BUN: 12 mg/dL (ref 8–23)
CO2: 26 mmol/L (ref 22–32)
Calcium: 10.9 mg/dL — ABNORMAL HIGH (ref 8.9–10.3)
Chloride: 102 mmol/L (ref 98–111)
Creatinine, Ser: 0.99 mg/dL (ref 0.44–1.00)
GFR, Estimated: 59 mL/min — ABNORMAL LOW (ref >60)
Glucose, Bld: 130 mg/dL — ABNORMAL HIGH (ref 70–99)
Potassium: 3.2 mmol/L — ABNORMAL LOW (ref 3.5–5.1)
Sodium: 142 mmol/L (ref 135–145)
Total Bilirubin: 0.4 mg/dL (ref 0.0–1.2)
Total Protein: 7.6 g/dL (ref 6.5–8.1)

## 2024-02-14 LAB — PRO BRAIN NATRIURETIC PEPTIDE: Pro Brain Natriuretic Peptide: 117 pg/mL (ref <300.0)

## 2024-02-14 LAB — CBC
HCT: 34 % — ABNORMAL LOW (ref 36.0–46.0)
Hemoglobin: 10.8 g/dL — ABNORMAL LOW (ref 12.0–15.0)
MCH: 31.4 pg (ref 26.0–34.0)
MCHC: 31.8 g/dL (ref 30.0–36.0)
MCV: 98.8 fL (ref 80.0–100.0)
Platelets: 262 K/uL (ref 150–400)
RBC: 3.44 MIL/uL — ABNORMAL LOW (ref 3.87–5.11)
RDW: 15.7 % — ABNORMAL HIGH (ref 11.5–15.5)
WBC: 6.9 K/uL (ref 4.0–10.5)
nRBC: 0 % (ref 0.0–0.2)

## 2024-02-14 LAB — I-STAT CG4 LACTIC ACID, ED
Lactic Acid, Venous: 2 mmol/L (ref 0.5–1.9)
Lactic Acid, Venous: 1.2 mmol/L (ref 0.5–1.9)

## 2024-02-14 LAB — TROPONIN T, HIGH SENSITIVITY: Troponin T High Sensitivity: 75 ng/L — ABNORMAL HIGH (ref 0–19)

## 2024-02-14 LAB — RESP PANEL BY RT-PCR (RSV, FLU A&B, COVID)  RVPGX2
Influenza A by PCR: NEGATIVE
Influenza B by PCR: NEGATIVE
Resp Syncytial Virus by PCR: NEGATIVE
SARS Coronavirus 2 by RT PCR: NEGATIVE

## 2024-02-14 LAB — CREATININE, SERUM
Creatinine, Ser: 0.91 mg/dL (ref 0.44–1.00)
GFR, Estimated: >60 mL/min (ref >60)

## 2024-02-14 LAB — PROTIME-INR
INR: 1 (ref 0.8–1.2)
Prothrombin Time: 13.8 s (ref 11.4–15.2)

## 2024-02-14 MED ORDER — SENNOSIDES-DOCUSATE SODIUM 8.6-50 MG PO TABS
2.0000 | ORAL_TABLET | Freq: Every day | ORAL | Status: DC
  Filled 2024-02-14: qty 2

## 2024-02-14 MED ORDER — AMLODIPINE BESYLATE 10 MG PO TABS
10.0000 mg | ORAL_TABLET | Freq: Every day | ORAL | Status: DC
  Administered 2024-02-14 – 2024-02-16 (×3): 10 mg via ORAL
  Filled 2024-02-14 (×2): qty 2
  Filled 2024-02-14: qty 1

## 2024-02-14 MED ORDER — PSYLLIUM 95 % PO PACK
1.0000 | PACK | Freq: Every day | ORAL | Status: DC
  Administered 2024-02-14: 20:00:00 1 via ORAL
  Filled 2024-02-14 (×3): qty 1

## 2024-02-14 MED ORDER — ENOXAPARIN SODIUM 40 MG/0.4ML IJ SOSY
40.0000 mg | PREFILLED_SYRINGE | INTRAMUSCULAR | Status: DC
  Administered 2024-02-14 – 2024-02-16 (×3): 40 mg via SUBCUTANEOUS
  Filled 2024-02-14 (×3): qty 0.4

## 2024-02-14 MED ORDER — LACTATED RINGERS IV BOLUS (SEPSIS)
1000.0000 mL | Freq: Once | INTRAVENOUS | Status: AC
  Administered 2024-02-14: 09:00:00 1000 mL via INTRAVENOUS

## 2024-02-14 MED ORDER — SODIUM CHLORIDE 0.9 % IV SOLN
1.0000 g | INTRAVENOUS | Status: DC
  Administered 2024-02-15 – 2024-02-16 (×2): 1 g via INTRAVENOUS
  Filled 2024-02-14 (×2): qty 10

## 2024-02-14 MED ORDER — GABAPENTIN 300 MG PO CAPS
300.0000 mg | ORAL_CAPSULE | Freq: Two times a day (BID) | ORAL | Status: DC
  Administered 2024-02-14 – 2024-02-16 (×5): 300 mg via ORAL
  Filled 2024-02-14 (×5): qty 1

## 2024-02-14 MED ORDER — POTASSIUM CHLORIDE CRYS ER 20 MEQ PO TBCR
40.0000 meq | EXTENDED_RELEASE_TABLET | Freq: Two times a day (BID) | ORAL | Status: AC
  Administered 2024-02-14 (×2): 40 meq via ORAL
  Filled 2024-02-14 (×2): qty 2

## 2024-02-14 MED ORDER — LACTATED RINGERS IV SOLN: INTRAVENOUS | Status: AC

## 2024-02-14 MED ORDER — MAGNESIUM SULFATE 2 GM/50ML IV SOLN
2.0000 g | Freq: Once | INTRAVENOUS | Status: AC
  Administered 2024-02-14: 14:00:00 2 g via INTRAVENOUS
  Filled 2024-02-14: qty 50

## 2024-02-14 MED ORDER — BUPROPION HCL ER (XL) 300 MG PO TB24
450.0000 mg | ORAL_TABLET | Freq: Every day | ORAL | Status: DC
  Administered 2024-02-14 – 2024-02-16 (×3): 450 mg via ORAL
  Filled 2024-02-14 (×2): qty 3
  Filled 2024-02-14: qty 1

## 2024-02-14 MED ORDER — DULOXETINE HCL 20 MG PO CPEP
120.0000 mg | ORAL_CAPSULE | Freq: Every day | ORAL | Status: DC
  Administered 2024-02-14 – 2024-02-16 (×3): 120 mg via ORAL
  Filled 2024-02-14 (×2): qty 2
  Filled 2024-02-14: qty 6

## 2024-02-14 MED ORDER — SODIUM CHLORIDE 0.9 % IV SOLN
2.0000 g | Freq: Once | INTRAVENOUS | Status: AC
  Administered 2024-02-14: 10:00:00 2 g via INTRAVENOUS
  Filled 2024-02-14: qty 20

## 2024-02-14 MED ORDER — PANTOPRAZOLE SODIUM 40 MG PO TBEC
40.0000 mg | DELAYED_RELEASE_TABLET | Freq: Every day | ORAL | Status: DC
  Administered 2024-02-14 – 2024-02-16 (×3): 40 mg via ORAL
  Filled 2024-02-14 (×3): qty 1

## 2024-02-14 MED ORDER — SODIUM CHLORIDE 0.9 % IV SOLN
500.0000 mg | Freq: Once | INTRAVENOUS | Status: AC
  Administered 2024-02-14: 10:00:00 500 mg via INTRAVENOUS
  Filled 2024-02-14: qty 5

## 2024-02-14 MED ORDER — OXYCODONE HCL 5 MG PO TABS
5.0000 mg | ORAL_TABLET | Freq: Four times a day (QID) | ORAL | Status: DC | PRN
  Administered 2024-02-14 – 2024-02-16 (×3): 5 mg via ORAL
  Filled 2024-02-14 (×3): qty 1

## 2024-02-14 MED ORDER — METOPROLOL SUCCINATE ER 100 MG PO TB24
200.0000 mg | ORAL_TABLET | Freq: Every day | ORAL | Status: DC
  Administered 2024-02-14: 13:00:00 200 mg via ORAL
  Filled 2024-02-14: qty 2

## 2024-02-14 MED ORDER — SODIUM CHLORIDE 0.9 % IV SOLN: INTRAVENOUS | Status: DC

## 2024-02-14 MED ORDER — QUETIAPINE FUMARATE 100 MG PO TABS
600.0000 mg | ORAL_TABLET | Freq: Every day | ORAL | Status: DC
  Administered 2024-02-14 – 2024-02-15 (×2): 600 mg via ORAL
  Filled 2024-02-14 (×2): qty 6

## 2024-02-14 MED ORDER — POLYETHYLENE GLYCOL 3350 17 G PO PACK
17.0000 g | PACK | Freq: Every day | ORAL | Status: DC
  Administered 2024-02-14 – 2024-02-15 (×2): 17 g via ORAL
  Filled 2024-02-14 (×3): qty 1

## 2024-02-14 MED ORDER — POTASSIUM CHLORIDE CRYS ER 20 MEQ PO TBCR
40.0000 meq | EXTENDED_RELEASE_TABLET | Freq: Once | ORAL | Status: AC
  Administered 2024-02-14: 09:00:00 40 meq via ORAL
  Filled 2024-02-14: qty 2

## 2024-02-14 MED ORDER — AZITHROMYCIN 250 MG PO TABS
500.0000 mg | ORAL_TABLET | Freq: Every day | ORAL | Status: AC
  Administered 2024-02-15 – 2024-02-16 (×2): 500 mg via ORAL
  Filled 2024-02-14 (×2): qty 2

## 2024-02-14 NOTE — Sepsis Progress Note (Signed)
 eLink is following this Code Sepsis.

## 2024-02-14 NOTE — ED Notes (Signed)
 Patient ambulated to the bathroom with assistance. Patient is now back in bed.

## 2024-02-14 NOTE — ED Notes (Signed)
 Awaiting patient from lobby.

## 2024-02-14 NOTE — H&P (Addendum)
 History and Physical    Patient: Tammy Mullins FMW:[id number] DOB: 1950/10/01 DOA: 02/13/2024 DOS: the patient was seen and examined on 02/14/2024 PCP: Wonda Worth SQUIBB, PA  Patient coming from: Home  Chief Complaint:  Chief Complaint  Patient presents with   Abnormal ECG   HPI: Tammy Mullins is a 73 y.o. female with medical history significant of fibromyalgia, IBS, HTN, HLD, DM2, bipolar disorder and depression p/w  tachycardia and found to have first degree AV block iso multifocal CAP.  The patient reported visiting their medical doctor for a checkup, where an EKG was performed. The doctor noted an increased heart rate and was concerned about the EKG results. Consequently, the patient was sent to the hospital for further evaluation. The patient mentioned taking metoprolol  for high blood pressure and confirmed taking their medication that morning. The patient reported not eating much the past few days and feeling dehydrated. Although the patient reported no symptoms of cough or fever, it was suggested that they might have been in the early stages of pneumonia, which could be contributing to the increased heart rate and difficulty breathing.   In the ED, pt tachycardic and hypertensive. No hypoxia. Labs notable for K 3.0-->3.2, BNP 112, troponin 63-->75, CK 492, D-dimer 2.55, and lactic acid 2.0-->1.2. CTA chest PE protocol neg for acute PE, but showed mild bilateral perihilar upper lobe and right lower lobe ground-glass pulmonary infiltrate. EDP started IV CTX/azithromycin  and requested medicine admission for CAP.   Review of Systems: As mentioned in the history of present illness. All other systems reviewed and are negative. Past Medical History:  Diagnosis Date   Allergic rhinitis, cause unspecified    Allergy    Anxiety state, unspecified    Aortic atherosclerosis 07/11/2022   Arthritis    Attention deficit disorder without mention of hyperactivity    Barrett's esophagus     Blood transfusion without reported diagnosis 2018-10   Chronic pain syndrome    Colon ulcer - IC valve 08/10/2016   Depression    Diabetes type 2, controlled (HCC) 01/09/2014   Diverticulosis of colon (without mention of hemorrhage)    Esophageal reflux    Fibromyalgia    Gout, unspecified    Headache    Hiatal hernia    Irritable bowel syndrome    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    Myalgia and myositis, unspecified    Osteoarthrosis, unspecified whether generalized or localized, unspecified site    Personal history of unspecified circulatory disease    Pure hypercholesterolemia    Radial styloid tenosynovitis    Unspecified essential hypertension    Unspecified nonpsychotic mental disorder    Unspecified pruritic disorder    Past Surgical History:  Procedure Laterality Date   ANKLE SURGERY Right    APPENDECTOMY     BACK SURGERY  03/19/2021   CARPAL TUNNEL RELEASE Right    CERVICAL SPINE SURGERY     plates and pins   CHOLECYSTECTOMY N/A 06/26/2015   Procedure: LAPAROSCOPIC CHOLECYSTECTOMY;  Surgeon: Lynda Leos, MD;  Location: [facility] OR;  Service: General;  Laterality: N/A;   COLONOSCOPY     COLONOSCOPY  2019   ESOPHAGOGASTRODUODENOSCOPY     ESOPHAGOGASTRODUODENOSCOPY N/A 12/04/2016   Procedure: ESOPHAGOGASTRODUODENOSCOPY (EGD);  Surgeon: Aneita Gwendlyn DASEN, MD;  Location: [facility] ENDOSCOPY;  Service: Endoscopy;  Laterality: N/A;   LAPAROSCOPIC APPENDECTOMY N/A 06/26/2015   Procedure: LAPAROSCOPIC APPENDECTOMY;  Surgeon: Lynda Leos, MD;  Location: [facility] OR;  Service: General;  Laterality: N/A;   ROTATOR CUFF  REPAIR Right    SALPINGOOPHORECTOMY  Left   ectopic 1980   TOTAL SHOULDER ARTHROPLASTY     TRANSFORAMINAL LUMBAR INTERBODY FUSION (TLIF) WITH PEDICLE SCREW FIXATION 2 LEVEL N/A 01/28/2019   Procedure: TRANSFORAMINAL LUMBAR INTERBODY FUSION LUMBAR THREE-FOUR, LUMBAR FOUR-FIVE;  Surgeon: Joshua Alm RAMAN, MD;  Location: [facility] OR;  Service: Neurosurgery;   Laterality: N/A;  posterior   UPPER GASTROINTESTINAL ENDOSCOPY     Social History:  reports that she has never smoked. She has never been exposed to tobacco smoke. She has never used smokeless tobacco. She reports that she does not drink alcohol and does not use drugs.  Allergies[1]  Family History  Problem Relation Age of Onset   Lung disease Mother        ?    Glaucoma Mother    Cirrhosis Father    Hypertension Sister    Heart disease Sister    Heart attack Sister    Hypertension Brother    Glaucoma Brother    Prostate cancer Brother    Hypertension Brother    Cancer Maternal Grandmother        ?   Kidney disease Neg Hx    Diabetes Neg Hx    Colon cancer Neg Hx    Colon polyps Neg Hx    Rectal cancer Neg Hx    Stomach cancer Neg Hx     Prior to Admission medications  Medication Sig Start Date End Date Taking? Authorizing Provider  allopurinol  (ZYLOPRIM ) 300 MG tablet TAKE 1 AND 1/2 TABLETS(450 MG) BY MOUTH DAILY 10/22/23   Dolphus Reiter, MD  amLODipine  (NORVASC ) 10 MG tablet Take 1 tablet (10 mg total) by mouth daily. TAKE 1 TABLET DAILY 01/22/24   Krishnan, Gokul, MD  Blood Glucose Monitoring Suppl (ONETOUCH VERIO FLEX SYSTEM) w/Device KIT Use as directed    [provider]  buPROPion  (WELLBUTRIN  XL) 150 MG 24 hr tablet Take 3 tablets (450 mg total) by mouth daily. 10/05/23   Rhys Verneita DASEN, PA-C  DULoxetine  (CYMBALTA ) 60 MG capsule Take 2 capsules (120 mg total) by mouth daily. 10/05/23   Rhys Verneita DASEN, PA-C  famotidine (PEPCID) 40 MG tablet Take 40 mg by mouth daily.    [provider]  ferrous sulfate  325 (65 FE) MG tablet Take 325 mg by mouth daily with breakfast.    [provider]  gabapentin  (NEURONTIN ) 600 MG tablet Take 0.5 tablets (300 mg total) by mouth 2 (two) times daily. 01/22/24   Verdene Purchase, MD  glucose blood (ONETOUCH VERIO) test strip Use as instructed    [provider]  hyoscyamine  (LEVSIN  SL) 0.125 MG SL  tablet SMARTSIG:1-2 Tablet(s) Sublingual Every 4-6 Hours PRN 03/29/22   [provider]  Lancets (ONETOUCH ULTRASOFT) lancets Use as instructed    [provider]  methocarbamol  (ROBAXIN ) 500 MG tablet Take 1 tablet (500 mg total) by mouth every 6 (six) hours as needed for muscle spasms. 01/22/24   Krishnan, Gokul, MD  metoprolol  (TOPROL -XL) 200 MG 24 hr tablet Take 200 mg by mouth daily. 04/11/22   [provider]  omeprazole  (PRILOSEC) 40 MG capsule Take 1 capsule (40 mg total) by mouth in the morning and at bedtime. 03/09/23   May, Deanna J, NP  oxyCODONE  (OXY IR/ROXICODONE ) 5 MG immediate release tablet Take 1 tablet (5 mg total) by mouth every 6 (six) hours as needed for moderate pain (pain score 4-6). 01/22/24   Krishnan, Gokul, MD  polyethylene glycol (MIRALAX  / GLYCOLAX ) 17  g packet Take 17 g by mouth 2 (two) times daily. 01/22/24   Krishnan, Gokul, MD  QUEtiapine  (SEROQUEL ) 300 MG tablet Take 2 tablets (600 mg total) by mouth at bedtime. 01/22/24   Verdene Purchase, MD  senna-docusate (SENOKOT-S) 8.6-50 MG tablet Take 2 tablets by mouth 2 (two) times daily. 01/22/24   Verdene Purchase, MD    Physical Exam: Vitals:   02/14/24 0636 02/14/24 0924 02/14/24 1015 02/14/24 1037  BP: (!) 184/99 (!) 191/92 (!) 159/61   Pulse: (!) 117 (!) 109 (!) 101   Resp: 20 12 (!) 22   Temp: 98.4 F (36.9 C)   98.6 F (37 C)  TempSrc:    Oral  SpO2: 98% 100% 100%   Weight:      Height:       General: Alert, oriented x3, resting comfortably in no acute distress Respiratory: Diffuse rhonchi; no w/r/r Cardiovascular: Regular rate and rhythm w/o m/r/g   Data Reviewed:  Lab Results  Component Value Date   WBC 6.9 02/14/2024   HGB 10.8 (L) 02/14/2024   HCT 34.0 (L) 02/14/2024   MCV 98.8 02/14/2024   PLT 262 02/14/2024   Lab Results  Component Value Date   GLUCOSE 130 (H) 02/14/2024   CALCIUM  10.9 (H) 02/14/2024   NA 142 02/14/2024   K 3.2 (L) 02/14/2024   CO2 26  02/14/2024   CL 102 02/14/2024   BUN 12 02/14/2024   CREATININE 0.99 02/14/2024   Lab Results  Component Value Date   ALT 20 02/14/2024   AST 29 02/14/2024   ALKPHOS 108 02/14/2024   BILITOT 0.4 02/14/2024   Lab Results  Component Value Date   INR 1.0 02/14/2024   INR 1.0 03/17/2021   INR 0.9 01/25/2019   Radiology: DG Chest 1 View Result Date: 02/14/2024 CLINICAL DATA:  Sepsis EXAM: CHEST  1 VIEW COMPARISON:  January 15, 2024 FINDINGS: The heart size and mediastinal contours are within normal limits. Both lungs are clear. The visualized skeletal structures are unremarkable. IMPRESSION: No active disease. Electronically Signed   By: Lynwood Landy Raddle M.D.   On: 02/14/2024 08:57   CT Angio Chest PE W and/or Wo Contrast Result Date: 02/13/2024 EXAM: CTA CHEST 02/13/2024 10:27:50 PM TECHNIQUE: CTA of the chest was performed without and with the administration of 75 mL of intravenous contrast (iohexol  (OMNIPAQUE ) 350 MG/ML injection). Multiplanar reformatted images are provided for review. MIP images are provided for review. Automated exposure control, iterative reconstruction, and/or weight based adjustment of the mA/kV was utilized to reduce the radiation dose to as low as reasonably achievable. COMPARISON: Prior examination of 12/12/2022. CLINICAL HISTORY: Pulmonary embolism (PE) suspected, low to intermediate prob, positive D-dimer. FINDINGS: PULMONARY ARTERIES: Pulmonary arteries are adequately opacified for evaluation. Central pulmonary arteries are of normal caliber. No acute pulmonary embolus. MEDIASTINUM: No significant coronary artery calcification. Global cardiac size within normal limits. A small pericardial effusion is present, which is new from prior examination of 12/12/2022. Mild atherosclerotic calcification within the thoracic aorta. No aortic aneurysm. The esophagus is patulous, which may reflect changes of esophageal dysmotility or gastroesophageal reflux. LYMPH NODES: No  mediastinal, hilar or axillary lymphadenopathy. LUNGS AND PLEURA: There is mild bilateral perihilar upper lobe and right lower lobe ground-glass pulmonary infiltrate, likely infectious or inflammatory in the acute setting. No evidence of pleural effusion or pneumothorax. UPPER ABDOMEN: Limited images of the upper abdomen are unremarkable. SOFT TISSUES AND BONES: Osseous structures are age appropriate. No acute bone abnormality. No lytic  or blastic bone lesion. No acute soft tissue abnormality. IMPRESSION: 1. No pulmonary embolism. 2. Mild bilateral perihilar upper lobe and right lower lobe ground-glass pulmonary infiltrate, likely infectious or inflammatory in the acute setting. 3. Small pericardial effusion, new from prior examination of 12/12/2022. 4. Mild thoracic aortic atherosclerosis. Electronically signed by: Dorethia Molt MD 02/13/2024 11:09 PM EST RP Workstation: [id number]    Assessment and Plan: 27F h/o fibromyalgia, IBS, HTN, HLD, DM2, bipolar disorder and depression p/w  tachycardia and found to have first degree AV block iso multifocal CAP.  Tachycardia  First degree AV block Hypokalemia Likely related to sepsis given rapid correction of lactic acid vs dehydration vs electrolyte abnl -HOLD pta Toprol  XL 200mg  daily (received in ED); resume as able -MIVF: LR at 100cc/h for now -IV Mg 2g x1 -Kdur 40mEq x2 doses -F/u Mg and replete prn  Multifocal CAP -IV CTX 1g daily to complete 5 day CAP course -PO azithromycin  500mg  daily to complete 3 day CAP course -Duonebs prn -Wean O2 as tolerated -Ambulatory pulse ox prior to d/c  HTN -PTA amlodipine  10mg  daily  Bipolar disorder Depression  -PTA Wellbutrin , Cymbalta , Neurontin , Seroquel   Constipation -PTA miralax  and senna/docusate   Advance Care Planning:   Code Status: Full Code   Consults: N/A  Family Communication: Niece  Severity of Illness: The appropriate patient status for this patient is INPATIENT. Inpatient status  is judged to be reasonable and necessary in order to provide the required intensity of service to ensure the patient's safety. The patient's presenting symptoms, physical exam findings, and initial radiographic and laboratory data in the context of their chronic comorbidities is felt to place them at high risk for further clinical deterioration. Furthermore, it is not anticipated that the patient will be medically stable for discharge from the hospital within 2 midnights of admission.   * I certify that at the point of admission it is my clinical judgment that the patient will require inpatient hospital care spanning beyond 2 midnights from the point of admission due to high intensity of service, high risk for further deterioration and high frequency of surveillance required.*   ------- I spent 57 minutes reviewing previous notes, at the bedside counseling/discussing the treatment plan, and performing clinical documentation.  Author: Marsha Ada, MD 02/14/2024 12:33 PM  For on call review [email or web address].      [1]  Allergies Allergen Reactions   Crestor  [Rosuvastatin  Calcium ] Other (See Comments)    Causes severe body aches   Dicyclomine  Other (See Comments)    Body and face rash, swelling.   Topiramate  Diarrhea   Asa [Aspirin ] Other (See Comments)    GI upset in hight doses can take a 81mg    Celebrex [Celecoxib] Swelling and Rash    Swelling in legs

## 2024-02-14 NOTE — ED Notes (Signed)
 IV infiltrated running azithromycin . [facility] Pharm[facility] notified. IV removed and cool compress applied.

## 2024-02-14 NOTE — ED Notes (Addendum)
 First set of cultures colleted by phlebotomy before abx started

## 2024-02-14 NOTE — ED Provider Notes (Signed)
 [place] EMERGENCY DEPARTMENT AT [facility] Provider Note   CSN: [id number] Arrival date & time: 02/13/24  1801     History {Add pertinent medical, surgical, social history, OB history to HPI:1} Chief Complaint  Patient presents with   Abnormal ECG    Tammy Mullins is a 73 y.o. female with PMH as listed below who presents after seeing her PCP yesterday afternoon and had an abnormal EKG and then told her to come to ED. She endorses feeling cold, loose bowels, and runny nose. No cough, SOB, fevers/chills. No weak feeling. No falls recently. No urinary sxs.    Past Medical History:  Diagnosis Date   Allergic rhinitis, cause unspecified    Allergy    Anxiety state, unspecified    Aortic atherosclerosis 07/11/2022   Arthritis    Attention deficit disorder without mention of hyperactivity    Barrett's esophagus    Blood transfusion without reported diagnosis 2018-10   Chronic pain syndrome    Colon ulcer - IC valve 08/10/2016   Depression    Diabetes type 2, controlled (HCC) 01/09/2014   Diverticulosis of colon (without mention of hemorrhage)    Esophageal reflux    Fibromyalgia    Gout, unspecified    Headache    Hiatal hernia    Irritable bowel syndrome    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 Authorizing Provider  allopurinol  (ZYLOPRIM ) 300 MG tablet TAKE 1 AND 1/2 TABLETS(450 MG) BY MOUTH DAILY 10/22/23   Dolphus Reiter, MD  amLODipine  (NORVASC ) 10 MG tablet Take 1 tablet (10 mg total) by  mouth daily. TAKE 1 TABLET DAILY 01/22/24   Krishnan, Gokul, MD  Blood Glucose Monitoring Suppl (ONETOUCH VERIO FLEX SYSTEM) w/Device KIT Use as directed    [provider]  buPROPion  (WELLBUTRIN  XL) 150 MG 24 hr tablet Take 3 tablets (450 mg total) by mouth daily. 10/05/23   Rhys Verneita DASEN, PA-C  DULoxetine  (CYMBALTA ) 60 MG capsule Take 2 capsules (120 mg total) by mouth daily. 10/05/23   Rhys Verneita DASEN, PA-C  famotidine (PEPCID) 40 MG tablet Take 40 mg by mouth daily.    [provider]  ferrous sulfate  325 (65 FE) MG tablet Take 325 mg by mouth daily with breakfast.    [provider]  gabapentin  (NEURONTIN ) 600 MG tablet Take 0.5 tablets (300 mg total) by mouth 2 (two) times daily. 01/22/24   Verdene Purchase, MD  glucose blood (ONETOUCH VERIO) test strip Use as instructed    [provider]  hyoscyamine  (LEVSIN  SL) 0.125 MG SL tablet SMARTSIG:1-2 Tablet(s) Sublingual Every 4-6 Hours PRN 03/29/22   [provider]  Lancets (ONETOUCH ULTRASOFT) lancets Use as instructed    [provider]  methocarbamol  (ROBAXIN ) 500 MG tablet Take 1 tablet (500 mg total) by mouth every 6 (six) hours as needed for muscle spasms. 01/22/24   Krishnan, Gokul, MD  metoprolol  (TOPROL -XL) 200 MG 24 hr tablet Take 200 mg by mouth daily. 04/11/22   [provider]  omeprazole  (PRILOSEC) 40 MG capsule  Take 1 capsule (40 mg total) by mouth in the morning and at bedtime. 03/09/23   May, Deanna J, NP  oxyCODONE  (OXY IR/ROXICODONE ) 5 MG immediate release tablet Take 1 tablet (5 mg total) by mouth every 6 (six) hours as needed for moderate pain (pain score 4-6). 01/22/24   Krishnan, Gokul, MD  polyethylene glycol (MIRALAX  / GLYCOLAX ) 17 g packet Take 17 g by mouth 2 (two) times daily. 01/22/24   Krishnan, Gokul, MD  QUEtiapine  (SEROQUEL ) 300 MG tablet Take 2 tablets (600 mg total) by mouth at bedtime. 01/22/24   Verdene Purchase, MD  senna-docusate (SENOKOT-S) 8.6-50 MG  tablet Take 2 tablets by mouth 2 (two) times daily. 01/22/24   Krishnan, Gokul, MD      Allergies    Crestor  [rosuvastatin  calcium ], Dicyclomine , Topiramate , Asa [aspirin ], and Celebrex [celecoxib]    Review of Systems   Review of Systems A 10 point review of systems was performed and is negative unless otherwise reported in HPI.  Physical Exam Updated Vital Signs BP (!) 184/99 (BP Location: Right Arm)   Pulse (!) 117   Temp 98.4 F (36.9 C)   Resp 20   Ht 5' 5 (1.651 m)   Wt 77.1 kg   SpO2 98%   BMI 28.29 kg/m  Physical Exam General: Normal appearing elderly female, lying in bed.  HEENT: PERRLA, Sclera anicteric, MMM, trachea midline.  Cardiology: RRR, no murmurs/rubs/gallops. BL radial and DP pulses equal bilaterally.  Resp: Normal respiratory rate and effort. CTAB, no wheezes, rhonchi, crackles.  Abd: Soft, non-tender, non-distended. No rebound tenderness or guarding.  GU: Deferred. MSK: No peripheral edema or signs of trauma. Extremities without deformity or TTP. No cyanosis or clubbing. Skin: warm, dry. Neuro: A&Ox4, CNs II-XII grossly intact. MAEs. Sensation grossly intact.  Psych: Normal mood and affect.   ED Results / Procedures / Treatments   Labs (all labs ordered are listed, but only abnormal results are displayed) Labs Reviewed  CBC - Abnormal; Notable for the following components:      Result Value   RBC 3.49 (*)    Hemoglobin 11.1 (*)    HCT 34.8 (*)    All other components within normal limits  BASIC METABOLIC PANEL WITH GFR - Abnormal; Notable for the following components:   Potassium 3.0 (*)    Glucose, Bld 110 (*)    GFR, Estimated 59 (*)    All other components within normal limits  D-DIMER, QUANTITATIVE - Abnormal; Notable for the following components:   D-Dimer, Quant 2.55 (*)    All other components within normal limits  CK - Abnormal; Notable for the following components:   Total CK 492 (*)    All other components within normal limits   TROPONIN T, HIGH SENSITIVITY - Abnormal; Notable for the following components:   Troponin T High Sensitivity 63 (*)    All other components within normal limits  TROPONIN T, HIGH SENSITIVITY - Abnormal; Notable for the following components:   Troponin T High Sensitivity 75 (*)    All other components within normal limits  RESP PANEL BY RT-PCR (RSV, FLU A&B, COVID)  RVPGX2  CULTURE, BLOOD (ROUTINE X 2)  CULTURE, BLOOD (ROUTINE X 2)  PRO BRAIN NATRIURETIC PEPTIDE  COMPREHENSIVE METABOLIC PANEL WITH GFR  PROTIME-INR  URINALYSIS, W/ REFLEX TO CULTURE (INFECTION SUSPECTED)  I-STAT CG4 LACTIC ACID, ED    EKG EKG Interpretation Date/Time:  Wednesday February 14 2024 02:01:06 EST Ventricular Rate:  115 PR Interval:  170 QRS  Duration:  70 QT Interval:  318 QTC Calculation: 439 R Axis:   8  Text Interpretation: Sinus tachycardia Minimal voltage criteria for LVH, may be normal variant ( R in aVL ) Anterior infarct , age undetermined  Confirmed by Franklyn Gills [phone]) on 02/14/2024 8:08:49 AM  Radiology CT Angio Chest PE W and/or Wo Contrast Result Date: 02/13/2024 EXAM: CTA CHEST 02/13/2024 10:27:50 PM TECHNIQUE: CTA of the chest was performed without and with the administration of 75 mL of intravenous contrast (iohexol  (OMNIPAQUE ) 350 MG/ML injection). Multiplanar reformatted images are provided for review. MIP images are provided for review. Automated exposure control, iterative reconstruction, and/or weight based adjustment of the mA/kV was utilized to reduce the radiation dose to as low as reasonably achievable. COMPARISON: Prior examination of 12/12/2022. CLINICAL HISTORY: Pulmonary embolism (PE) suspected, low to intermediate prob, positive D-dimer. FINDINGS: PULMONARY ARTERIES: Pulmonary arteries are adequately opacified for evaluation. Central pulmonary arteries are of normal caliber. No acute pulmonary embolus. MEDIASTINUM: No significant coronary artery calcification. Global  cardiac size within normal limits. A small pericardial effusion is present, which is new from prior examination of 12/12/2022. Mild atherosclerotic calcification within the thoracic aorta. No aortic aneurysm. The esophagus is patulous, which may reflect changes of esophageal dysmotility or gastroesophageal reflux. LYMPH NODES: No mediastinal, hilar or axillary lymphadenopathy. LUNGS AND PLEURA: There is mild bilateral perihilar upper lobe and right lower lobe ground-glass pulmonary infiltrate, likely infectious or inflammatory in the acute setting. No evidence of pleural effusion or pneumothorax. UPPER ABDOMEN: Limited images of the upper abdomen are unremarkable. SOFT TISSUES AND BONES: Osseous structures are age appropriate. No acute bone abnormality. No lytic or blastic bone lesion. No acute soft tissue abnormality. IMPRESSION: 1. No pulmonary embolism. 2. Mild bilateral perihilar upper lobe and right lower lobe ground-glass pulmonary infiltrate, likely infectious or inflammatory in the acute setting. 3. Small pericardial effusion, new from prior examination of 12/12/2022. 4. Mild thoracic aortic atherosclerosis. Electronically signed by: Dorethia Molt MD 02/13/2024 11:09 PM EST RP Workstation: [id number]    Procedures Procedures  {Document cardiac monitor, telemetry assessment procedure when appropriate:1}  Medications Ordered in ED Medications  lactated ringers  infusion (has no administration in time range)  lactated ringers  bolus 1,000 mL (has no administration in time range)  cefTRIAXone  (ROCEPHIN ) 2 g in sodium chloride  0.9 % 100 mL IVPB (has no administration in time range)  azithromycin  (ZITHROMAX ) 500 mg in sodium chloride  0.9 % 250 mL IVPB (has no administration in time range)  potassium chloride  SA (KLOR-CON  M) CR tablet 40 mEq (has no administration in time range)  iohexol  (OMNIPAQUE ) 350 MG/ML injection 75 mL (75 mLs Intravenous Contrast Given 02/13/24 2228)    ED Course/ Medical  Decision Making/ A&P                          Medical Decision Making Amount and/or Complexity of Data Reviewed Labs: ordered. Radiology: ordered.  Risk Prescription drug management.    This patient presents to the ED for concern of ***, this involves an extensive number of treatment options, and is a complaint that carries with it a high risk of complications and morbidity.  I considered the following differential and admission for this acute, potentially life threatening condition.   MDM:    ***  Clinical Course as of 02/14/24 0842  Wed Feb 14, 2024  0840 Pro Brain Natriuretic Peptide: 112.0 neg [HN]  0840 Resp(!): 22 [HN]  0840 Pulse Rate(!): 123 [HN]  0840 Pulse Rate(!): 117 Will treat w/ fluids, 12/2023 EF 60-65%, will give 1L IVF and obtain blood cultures, lactic, and give IV abx for PNA.  [HN]  0842 Troponin T High Sensitivity(!): 75 63-->75. Mildly elevated but stable [HN]  0842 D-Dimer, Quant(!): 2.55 Elevated, so got CT PE, which showed infiltrate and small pericardial effusion [HN]  0842 WBC: 6.6 No leukocytosis  [HN]  0842 Potassium(!): 3.0 Mild hypokalemia, will replete [HN]    Clinical Course User Index [HN] Franklyn Sid SAILOR, MD    Labs: I Ordered, and personally interpreted labs.  The pertinent results include:  ***  Imaging Studies ordered: I ordered imaging studies including *** I independently visualized and interpreted imaging. I agree with the radiologist interpretation  Additional history obtained from ***.  External records from outside source obtained and reviewed including ***  Cardiac Monitoring: The patient was maintained on a cardiac monitor.  I personally viewed and interpreted the cardiac monitored which showed an underlying rhythm of: ***  Reevaluation: After the interventions noted above, I reevaluated the patient and found that they have :{resolved/improved/worsened:23923::improved}  Social Determinants of  Health: ***  Disposition:  ***  Co morbidities that complicate the patient evaluation  Past Medical History:  Diagnosis Date   Allergic rhinitis, cause unspecified    Allergy    Anxiety state, unspecified    Aortic atherosclerosis 07/11/2022   Arthritis    Attention deficit disorder without mention of hyperactivity    Barrett's esophagus    Blood transfusion without reported diagnosis 2018-10   Chronic pain syndrome    Colon ulcer - IC valve 08/10/2016   Depression    Diabetes type 2, controlled (HCC) 01/09/2014   Diverticulosis of colon (without mention of hemorrhage)    Esophageal reflux    Fibromyalgia    Gout, unspecified    Headache    Hiatal hernia    Irritable bowel syndrome    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    Myalgia and myositis, unspecified    Osteoarthrosis, unspecified whether generalized or localized, unspecified site    Personal history of unspecified circulatory disease    Pure hypercholesterolemia    Radial styloid tenosynovitis    Unspecified essential hypertension    Unspecified nonpsychotic mental disorder    Unspecified pruritic disorder      Medicines Meds ordered this encounter  Medications   iohexol  (OMNIPAQUE ) 350 MG/ML injection 75 mL   lactated ringers  infusion   lactated ringers  bolus 1,000 mL    Reason 30 mL/kg dose is not being ordered:   First Lactic Acid Pending   cefTRIAXone  (ROCEPHIN ) 2 g in sodium chloride  0.9 % 100 mL IVPB    Antibiotic Indication::   CAP   azithromycin  (ZITHROMAX ) 500 mg in sodium chloride  0.9 % 250 mL IVPB    Antibiotic Indication::   CAP   potassium chloride  SA (KLOR-CON  M) CR tablet 40 mEq    I have reviewed the patients home medicines and have made adjustments as needed  Problem List / ED Course: Problem List Items Addressed This Visit   None Visit Diagnoses       Pneumonia of right middle lobe due to infectious organism    -  Primary   Relevant Medications   cefTRIAXone   (ROCEPHIN ) 2 g in sodium chloride  0.9 % 100 mL IVPB   azithromycin  (ZITHROMAX ) 500 mg in sodium chloride  0.9 % 250 mL IVPB     Pericardial effusion                {  Document critical care time when appropriate:1} {Document review of labs and clinical decision tools ie heart score, Chads2Vasc2 etc:1}  {Document your independent review of radiology images, and any outside records:1} {Document your discussion with family members, caretakers, and with consultants:1} {Document social determinants of health affecting pt's care:1} {Document your decision making why or why not admission, treatments were needed:1}  This note was created using dictation software, which may contain spelling or grammatical errors.

## 2024-02-15 DIAGNOSIS — A419 Sepsis, unspecified organism: Secondary | ICD-10-CM | POA: Diagnosis not present

## 2024-02-15 DIAGNOSIS — J189 Pneumonia, unspecified organism: Secondary | ICD-10-CM | POA: Diagnosis not present

## 2024-02-15 LAB — BASIC METABOLIC PANEL WITH GFR
Anion gap: 8 (ref 5–15)
BUN: 9 mg/dL (ref 8–23)
CO2: 25 mmol/L (ref 22–32)
Calcium: 9.6 mg/dL (ref 8.9–10.3)
Chloride: 106 mmol/L (ref 98–111)
Creatinine, Ser: 0.73 mg/dL (ref 0.44–1.00)
GFR, Estimated: >60 mL/min (ref >60)
Glucose, Bld: 103 mg/dL — ABNORMAL HIGH (ref 70–99)
Potassium: 4 mmol/L (ref 3.5–5.1)
Sodium: 139 mmol/L (ref 135–145)

## 2024-02-15 LAB — CBC
HCT: 30.8 % — ABNORMAL LOW (ref 36.0–46.0)
Hemoglobin: 9.8 g/dL — ABNORMAL LOW (ref 12.0–15.0)
MCH: 32 pg (ref 26.0–34.0)
MCHC: 31.8 g/dL (ref 30.0–36.0)
MCV: 100.7 fL — ABNORMAL HIGH (ref 80.0–100.0)
Platelets: 214 K/uL (ref 150–400)
RBC: 3.06 MIL/uL — ABNORMAL LOW (ref 3.87–5.11)
RDW: 15.9 % — ABNORMAL HIGH (ref 11.5–15.5)
WBC: 6 K/uL (ref 4.0–10.5)
nRBC: 0 % (ref 0.0–0.2)

## 2024-02-15 LAB — MAGNESIUM: Magnesium: 1.7 mg/dL (ref 1.7–2.4)

## 2024-02-15 MED ORDER — METOPROLOL SUCCINATE ER 50 MG PO TB24
100.0000 mg | ORAL_TABLET | Freq: Every day | ORAL | Status: DC
  Administered 2024-02-15 – 2024-02-16 (×2): 100 mg via ORAL
  Filled 2024-02-15 (×2): qty 2

## 2024-02-15 NOTE — Hospital Course (Signed)
 Tammy Mullins is a 73 y.o. female with past medical history significant of fibromyalgia, irritable bowel syndrome, hypertension, hyperlipidemia, diabetes mellitus type 2, bipolar disorder and depression presented to hospital with tachycardia and was found to have first-degree AV block due to multifocal pneumonia.  She had gone to her PCP when EKG was done and was sent to the hospital for further evaluation.  In the ED patient was tachycardic and hypotensive without any hypoxia.  Labs were notable for hypokalemia with potassium of 3.0 BNP of 112, troponin 63-->75, CK 492, D-dimer 2.55, and lactic acid 2.0-->1.2. CTA chest PE protocol neg for acute PE, but showed mild bilateral perihilar upper lobe and right lower lobe ground-glass pulmonary infiltrate.  Urinalysis was negative for infection.  Patient was then admitted hospital for further evaluation and treatment  Tachycardia  First degree AV block  Improved at this time. Hypokalemia .  Received IV fluids magnesium  and potassium supplements.  Improved at this time.  Latest potassium of 4.0.  Community-acquired pneumonia multifocal. Patient is afebrile.  On room air.  No leukocytosis.  Continue Rocephin  and Zithromax  DuoNebs.  Blood cultures negative less than 24 hours  HTN Continue amlodipine    Bipolar disorder Depression  Patient was on  Cymbalta  Neurontin  and Seroquel  as outpatient.   Constipation Continue MiraLAX  Senokot and Depakote

## 2024-02-15 NOTE — Progress Notes (Signed)
° °  Brief Progress Note   _____________________________________________________________________________________________________________  Patient Name: Tammy Mullins Patient DOB: [birth year] Date: @TODAY @      Data: Reviewed vital signs, labs, and clinical notes.    Action: Reached out to attending regarding patient being downgraded.    Response:  Pt downgraded to tele.   _____________________________________________________________________________________________________________  [facility] RN Expeditor Malgorzata Albert S Cherylynn Liszewski Please contact us  directly via secure chat (search for [facility]) or by calling us  at [phone] [facility]).

## 2024-02-15 NOTE — Progress Notes (Addendum)
 PROGRESS NOTE  Tammy Mullins FMW:[id number] DOB: December 16, 1950 DOA: 02/13/2024 PCP: Wonda Worth SQUIBB, PA   LOS: 1 day   Brief narrative:   Tammy Mullins is a 73 y.o. female with past medical history significant of fibromyalgia, irritable bowel syndrome, hypertension, hyperlipidemia, diabetes mellitus type 2, bipolar disorder and depression presented to hospital with tachycardia and was found to have first-degree AV block due to multifocal pneumonia.  She had gone to her PCP when EKG was done and was sent to the hospital for further evaluation.  In the ED patient was tachycardic and hypertensive.  Labs were notable for hypokalemia with potassium of 3.0 BNP of 112, troponin 63-->75, CK 492, D-dimer 2.55, and lactic acid 2.0-->1.2. CTA chest PE protocol neg for acute PE, but showed mild bilateral perihilar upper lobe and right lower lobe ground-glass pulmonary infiltrate.  Urinalysis was negative for infection.  Patient was then admitted hospital for further evaluation and treatment    Assessment/Plan: Principal Problem:   Sepsis due to pneumonia (HCC)  Tachycardia  First degree AV block Improved at this time.  Will continue to monitor in telemetry.  See for ongoing arrhythmias.  Replenish electrolytes.  Patient did have a history of cardiac event monitoring on 01/22/2024 for syncope..  Review of 2D echocardiogram from 12/2023 showed LV ejection fraction of 60 to 65% with grade 1 diastolic dysfunction. Was on 200mg  of metoprolol  now BP is high, will restart at 100mg /day  Hypokalemia .  Received IV fluids magnesium  and potassium supplements.  Improved at this time.  Latest potassium of 4.0.  Community-acquired pneumonia multifocal. Patient is afebrile.  On room air.  No leukocytosis.  Continue Rocephin  and Zithromax  DuoNebs.  Blood cultures negative less than 24 hours.  Will continue to monitor closely.  HTN Continue amlodipine    Bipolar disorder Depression  Patient was on  Cymbalta   Neurontin  and Seroquel  as outpatient.   Constipation Continue MiraLAX  Senokot and Depakote  Debility weakness.  Will get PT evaluation.  DVT prophylaxis: enoxaparin  (LOVENOX ) injection 40 mg Start: 02/14/24 1130   Disposition: Home likely in 1 to 2 days  Status is: Inpatient Remains inpatient appropriate because: Pending clinical improvement    Code Status:     Code Status: Full Code  Family Communication: None at bedside  Consultants: None yet  Procedures: None  Anti-infectives:  Rocephin  and Zithromax  IV  Anti-infectives (From admission, onward)    Start     Dose/Rate Route Frequency Ordered Stop   02/15/24 1000  cefTRIAXone  (ROCEPHIN ) 1 g in sodium chloride  0.9 % 100 mL IVPB        1 g 200 mL/hr over 30 Minutes Intravenous Every 24 hours 02/14/24 1127 02/19/24 0959   02/15/24 1000  azithromycin  (ZITHROMAX ) tablet 500 mg        500 mg Oral Daily 02/14/24 1127 02/17/24 0959   02/14/24 0830  cefTRIAXone  (ROCEPHIN ) 2 g in sodium chloride  0.9 % 100 mL IVPB        2 g 200 mL/hr over 30 Minutes Intravenous Once 02/14/24 0815 02/14/24 1003   02/14/24 0830  azithromycin  (ZITHROMAX ) 500 mg in sodium chloride  0.9 % 250 mL IVPB        500 mg 250 mL/hr over 60 Minutes Intravenous  Once 02/14/24 0815 02/14/24 1108        Subjective: Today, patient was seen and examined at bedside.  Patient complains of fatigue and weakness with some back pain.  Denies any nausea vomiting fever or chills.  Has mild cough  but no overt dyspnea.  Objective: Vitals:   02/15/24 0500 02/15/24 0559  BP: 126/64   Pulse: 71   Resp: 13   Temp:  98 F (36.7 C)  SpO2: 93%    No intake or output data in the 24 hours ending 02/15/24 1055 Filed Weights   02/13/24 1810  Weight: 77.1 kg   Body mass index is 28.29 kg/m.   Physical Exam: GENERAL: Patient is alert awake and oriented. Not in obvious distress. HENT: No scleral pallor or icterus. Pupils equally reactive to light. Oral mucosa is  moist NECK: is supple, no gross swelling noted. CHEST: Coarse breath sounds noted bilaterally.  Diminished breath sounds bilaterally. CVS: S1 and S2 heard, no murmur. Regular rate and rhythm.  ABDOMEN: Soft, non-tender, bowel sounds are present. EXTREMITIES: No edema. CNS: Cranial nerves are intact. No focal motor deficits. SKIN: warm and dry without rashes.  Data Review: I have personally reviewed the following laboratory data and studies,  CBC: Recent Labs  Lab 02/13/24 1853 02/14/24 1206 02/15/24 0220  WBC 6.6 6.9 6.0  HGB 11.1* 10.8* 9.8*  HCT 34.8* 34.0* 30.8*  MCV 99.7 98.8 100.7*  PLT 291 262 214   Basic Metabolic Panel: Recent Labs  Lab 02/13/24 1853 02/14/24 0857 02/14/24 1206 02/15/24 0220  NA 140 142  --  139  K 3.0* 3.2*  --  4.0  CL 102 102  --  106  CO2 25 26  --  25  GLUCOSE 110* 130*  --  103*  BUN 10 12  --  9  CREATININE 0.99 0.99 0.91 0.73  CALCIUM  10.3 10.9*  --  9.6  MG  --   --   --  1.7   Liver Function Tests: Recent Labs  Lab 02/14/24 0857  AST 29  ALT 20  ALKPHOS 108  BILITOT 0.4  PROT 7.6  ALBUMIN 4.6   No results for input(s): LIPASE, AMYLASE in the last 168 hours. No results for input(s): AMMONIA in the last 168 hours. Cardiac Enzymes: Recent Labs  Lab 02/13/24 1853  CKTOTAL 492*   BNP (last 3 results) No results for input(s): BNP in the last 8760 hours.  ProBNP (last 3 results) Recent Labs    02/13/24 2145 02/14/24 1206  PROBNP 112.0 117.0    CBG: No results for input(s): GLUCAP in the last 168 hours. Recent Results (from the past 240 hours)  Resp panel by RT-PCR (RSV, Flu A&B, Covid) Anterior Nasal Swab     Status: None   Collection Time: 02/14/24  8:15 AM   Specimen: Anterior Nasal Swab  Result Value Ref Range Status   SARS Coronavirus 2 by RT PCR NEGATIVE NEGATIVE Final   Influenza A by PCR NEGATIVE NEGATIVE Final   Influenza B by PCR NEGATIVE NEGATIVE Final    Comment: (NOTE) The Xpert Xpress  SARS-CoV-2/FLU/RSV plus assay is intended as an aid in the diagnosis of influenza from Nasopharyngeal swab specimens and should not be used as a sole basis for treatment. Nasal washings and aspirates are unacceptable for Xpert Xpress SARS-CoV-2/FLU/RSV testing.  Fact Sheet for Patients: bloggercourse.com  Fact Sheet for Healthcare Providers: seriousbroker.it  This test is not yet approved or cleared by the [place]  FDA and has been authorized for detection and/or diagnosis of SARS-CoV-2 by FDA under an Emergency Use Authorization (EUA). This EUA will remain in effect (meaning this test can be used) for the duration of the COVID-19 declaration under Section 564(b)(1) of the Act, 21 U.S.C.  section 360bbb-3(b)(1), unless the authorization is terminated or revoked.     Resp Syncytial Virus by PCR NEGATIVE NEGATIVE Final    Comment: (NOTE) Fact Sheet for Patients: bloggercourse.com  Fact Sheet for Healthcare Providers: seriousbroker.it  This test is not yet approved or cleared by the [place]  FDA and has been authorized for detection and/or diagnosis of SARS-CoV-2 by FDA under an Emergency Use Authorization (EUA). This EUA will remain in effect (meaning this test can be used) for the duration of the COVID-19 declaration under Section 564(b)(1) of the Act, 21 U.S.C. section 360bbb-3(b)(1), unless the authorization is terminated or revoked.  Performed at [facility], [address], [place], [place] [address]   Blood Culture (routine x 2)     Status: None (Preliminary result)   Collection Time: 02/14/24  8:57 AM   Specimen: BLOOD RIGHT ARM  Result Value Ref Range Status   Specimen Description BLOOD RIGHT ARM  Final   Special Requests   Final    BOTTLES DRAWN AEROBIC AND ANAEROBIC Blood Culture adequate volume   Culture   Final    NO GROWTH < 24 HOURS Performed  at [facility], [address], [place], [place] [address]    Report Status PENDING  Incomplete     Studies: DG Chest 1 View Result Date: 02/14/2024 CLINICAL DATA:  Sepsis EXAM: CHEST  1 VIEW COMPARISON:  January 15, 2024 FINDINGS: The heart size and mediastinal contours are within normal limits. Both lungs are clear. The visualized skeletal structures are unremarkable. IMPRESSION: No active disease. Electronically Signed   By: Lynwood Landy Raddle M.D.   On: 02/14/2024 08:57   CT Angio Chest PE W and/or Wo Contrast Result Date: 02/13/2024 EXAM: CTA CHEST 02/13/2024 10:27:50 PM TECHNIQUE: CTA of the chest was performed without and with the administration of 75 mL of intravenous contrast (iohexol  (OMNIPAQUE ) 350 MG/ML injection). Multiplanar reformatted images are provided for review. MIP images are provided for review. Automated exposure control, iterative reconstruction, and/or weight based adjustment of the mA/kV was utilized to reduce the radiation dose to as low as reasonably achievable. COMPARISON: Prior examination of 12/12/2022. CLINICAL HISTORY: Pulmonary embolism (PE) suspected, low to intermediate prob, positive D-dimer. FINDINGS: PULMONARY ARTERIES: Pulmonary arteries are adequately opacified for evaluation. Central pulmonary arteries are of normal caliber. No acute pulmonary embolus. MEDIASTINUM: No significant coronary artery calcification. Global cardiac size within normal limits. A small pericardial effusion is present, which is new from prior examination of 12/12/2022. Mild atherosclerotic calcification within the thoracic aorta. No aortic aneurysm. The esophagus is patulous, which may reflect changes of esophageal dysmotility or gastroesophageal reflux. LYMPH NODES: No mediastinal, hilar or axillary lymphadenopathy. LUNGS AND PLEURA: There is mild bilateral perihilar upper lobe and right lower lobe ground-glass pulmonary infiltrate, likely infectious or inflammatory in the acute  setting. No evidence of pleural effusion or pneumothorax. UPPER ABDOMEN: Limited images of the upper abdomen are unremarkable. SOFT TISSUES AND BONES: Osseous structures are age appropriate. No acute bone abnormality. No lytic or blastic bone lesion. No acute soft tissue abnormality. IMPRESSION: 1. No pulmonary embolism. 2. Mild bilateral perihilar upper lobe and right lower lobe ground-glass pulmonary infiltrate, likely infectious or inflammatory in the acute setting. 3. Small pericardial effusion, new from prior examination of 12/12/2022. 4. Mild thoracic aortic atherosclerosis. Electronically signed by: Dorethia Molt MD 02/13/2024 11:09 PM EST RP Workstation: [id number]      Vernal Alstrom, MD  [facility] 02/15/2024  If 7PM-7AM, please contact night-coverage

## 2024-02-15 NOTE — Evaluation (Signed)
 Physical Therapy Evaluation Patient Details Name: Tammy Mullins MRN: [id number] DOB: Nov 03, 1950 Today's Date: 02/15/2024  History of Present Illness  73 y.o. female  p/w  tachycardia and found to have first degree AV block iso multifocal CAP. Recent admission 01/14/24-01/22/24 with fall and rhabdo. Pt with medical history significant of fibromyalgia, IBS, HTN, HLD, DM2, bipolar disorder and depression  Clinical Impression  Pt admitted with above diagnosis. Pt ambulated 160' with RW with verbal cues for safety. Noted recent hospitalization for fall with rhabdomyolysis 11/16-11/24/25. Pt reports L foot/calf pain and tingling from an injury in that fall, pain meds requested. HR 102 with walking, HR 90 at rest. No dyspnea with walking, pulse oximeter did not give a reading for SpO2 during ambulation.  Pt currently with functional limitations due to the deficits listed below (see PT Problem List). Pt will benefit from acute skilled PT to increase their independence and safety with mobility to allow discharge.           If plan is discharge home, recommend the following: A little help with bathing/dressing/bathroom;Assistance with cooking/housework;Assist for transportation;Help with stairs or ramp for entrance   Can travel by private vehicle        Equipment Recommendations None recommended by PT  Recommendations for Other Services       Functional Status Assessment Patient has had a recent decline in their functional status and demonstrates the ability to make significant improvements in function in a reasonable and predictable amount of time.     Precautions / Restrictions Precautions Precautions: Fall Recall of Precautions/Restrictions: Intact Precaution/Restrictions Comments: fall in November with rhabdo Restrictions Weight Bearing Restrictions Per Provider Order: No      Mobility  Bed Mobility Overal bed mobility: Modified Independent             General bed mobility  comments: HOB up, used rail    Transfers Overall transfer level: Needs assistance Equipment used: Rolling walker (2 wheels) Transfers: Sit to/from Stand Sit to Stand: Supervision           General transfer comment: VCs hand placement    Ambulation/Gait Ambulation/Gait assistance: Supervision Gait Distance (Feet): 160 Feet Assistive device: Rolling walker (2 wheels) Gait Pattern/deviations: Step-through pattern, Decreased stride length Gait velocity: decr     General Gait Details: VCs for proximity to RW, HR 102 walking, HR 90 at rest, no dyspnea, no loss of balance  Stairs            Wheelchair Mobility     Tilt Bed    Modified Rankin (Stroke Patients Only)       Balance Overall balance assessment: Modified Independent, History of Falls                                           Pertinent Vitals/Pain Pain Assessment Pain Assessment: 0-10 Pain Score: 6  Pain Location: L foot Pain Descriptors / Indicators: Sore Pain Intervention(s): Limited activity within patient's tolerance, Monitored during session, Patient requesting pain meds-RN notified, Repositioned    Home Living Family/patient expects to be discharged to:: Private residence Living Arrangements: Alone Available Help at Discharge: Available 24 hours/day;Family Type of Home: House Home Access: Stairs to enter Entrance Stairs-Rails: None Entrance Stairs-Number of Steps: 1   Home Layout: One level Home Equipment: Agricultural Consultant (2 wheels);Rollator (4 wheels);Cane - single point;Grab bars - tub/shower Additional Comments: 2 nieces can assist  at times but not 24/7    Prior Function Prior Level of Function : Independent/Modified Independent             Mobility Comments: uses RW ADLs Comments: indep     Extremity/Trunk Assessment   Upper Extremity Assessment Upper Extremity Assessment: Overall WFL for tasks assessed    Lower Extremity Assessment Lower Extremity  Assessment: LLE deficits/detail LLE Deficits / Details: pt reports coldness and tingling in L lower leg/foot since her fall in mid November, pt reports she had a blistering wound on L calf from that fall (she spent the night on the floor) LLE Sensation: decreased light touch    Cervical / Trunk Assessment Cervical / Trunk Assessment: Normal  Communication   Communication Communication: No apparent difficulties    Cognition Arousal: Alert Behavior During Therapy: WFL for tasks assessed/performed   PT - Cognitive impairments: Safety/Judgement                       PT - Cognition Comments: some VCs required for safety (such as not releasing RW prior to reaching recliner)         Cueing Cueing Techniques: Verbal cues     General Comments      Exercises     Assessment/Plan    PT Assessment Patient needs continued PT services  PT Problem List Decreased activity tolerance;Decreased balance       PT Treatment Interventions Gait training;Therapeutic activities;Balance training;Patient/family education    PT Goals (Current goals can be found in the Care Plan section)  Acute Rehab PT Goals Patient Stated Goal: L foot to stop hurting PT Goal Formulation: With patient Time For Goal Achievement: 02/29/24 Potential to Achieve Goals: Good    Frequency Min 3X/week     Co-evaluation               AM-PAC PT 6 Clicks Mobility  Outcome Measure Help needed turning from your back to your side while in a flat bed without using bedrails?: None Help needed moving from lying on your back to sitting on the side of a flat bed without using bedrails?: None Help needed moving to and from a bed to a chair (including a wheelchair)?: A Little Help needed standing up from a chair using your arms (e.g., wheelchair or bedside chair)?: A Little Help needed to walk in hospital room?: A Little Help needed climbing 3-5 steps with a railing? : A Little 6 Click Score: 20    End  of Session Equipment Utilized During Treatment: Gait belt Activity Tolerance: Patient tolerated treatment well Patient left: in chair;with chair alarm set;with call bell/phone within reach Nurse Communication: Mobility status PT Visit Diagnosis: Difficulty in walking, not elsewhere classified (R26.2);Other abnormalities of gait and mobility (R26.89)    Time: 1424-1450 PT Time Calculation (min) (ACUTE ONLY): 26 min   Charges:   PT Evaluation $PT Eval Moderate Complexity: 1 Mod PT Treatments $Gait Training: 8-22 mins PT General Charges $$ ACUTE PT VISIT: 1 Visit        Sylvan Delon Copp PT 02/15/2024  Acute Rehabilitation Services  Office [phone]

## 2024-02-15 NOTE — Progress Notes (Signed)
° ° °  EXPEDITER LEVEL LOADING ASSESSMENT NOTE  Patient Name: Tammy Mullins  DOB:[birth year] Date of Admission: 02/13/2024  Date of Assessment:02/15/2024   -------------------------------------------------------------------------------------------------------------------   Brief clinical summary: 73 y.o. female with tachycardia/first degree AV block iso multifocal CAP.   Is there Bed Availability at another [facility]? Yes  If yes, what facility: [facility]  Level of Care Needed:  Yes  MD Agree to transfer: Yes  Patient agrees to transfer: Yes    -------------------------------------------------------------------------------------------------------------------  [facility] RN Expediter, [place] [address] [place] Please contact us  directly via secure chat (search for [facility]) or by calling us  at [phone] [facility]).

## 2024-02-15 NOTE — ED Notes (Signed)
 [facility] en [facility] to transport patient to [facility]. RN and Charge made aware of update.

## 2024-02-16 ENCOUNTER — Other Ambulatory Visit (HOSPITAL_COMMUNITY): Payer: Self-pay

## 2024-02-16 DIAGNOSIS — E876 Hypokalemia: Secondary | ICD-10-CM | POA: Insufficient documentation

## 2024-02-16 DIAGNOSIS — R000 Tachycardia, unspecified: Secondary | ICD-10-CM

## 2024-02-16 DIAGNOSIS — A419 Sepsis, unspecified organism: Secondary | ICD-10-CM | POA: Diagnosis not present

## 2024-02-16 DIAGNOSIS — J189 Pneumonia, unspecified organism: Secondary | ICD-10-CM | POA: Diagnosis not present

## 2024-02-16 LAB — CBC
HCT: 29.9 % — ABNORMAL LOW (ref 36.0–46.0)
Hemoglobin: 9.4 g/dL — ABNORMAL LOW (ref 12.0–15.0)
MCH: 31.4 pg (ref 26.0–34.0)
MCHC: 31.4 g/dL (ref 30.0–36.0)
MCV: 100 fL (ref 80.0–100.0)
Platelets: 226 K/uL (ref 150–400)
RBC: 2.99 MIL/uL — ABNORMAL LOW (ref 3.87–5.11)
RDW: 15.9 % — ABNORMAL HIGH (ref 11.5–15.5)
WBC: 5.7 K/uL (ref 4.0–10.5)
nRBC: 0 % (ref 0.0–0.2)

## 2024-02-16 LAB — BASIC METABOLIC PANEL WITH GFR
Anion gap: 7 (ref 5–15)
BUN: 6 mg/dL — ABNORMAL LOW (ref 8–23)
CO2: 28 mmol/L (ref 22–32)
Calcium: 9.7 mg/dL (ref 8.9–10.3)
Chloride: 107 mmol/L (ref 98–111)
Creatinine, Ser: 0.82 mg/dL (ref 0.44–1.00)
GFR, Estimated: >60 mL/min
Glucose, Bld: 100 mg/dL — ABNORMAL HIGH (ref 70–99)
Potassium: 3.8 mmol/L (ref 3.5–5.1)
Sodium: 142 mmol/L (ref 135–145)

## 2024-02-16 LAB — MAGNESIUM: Magnesium: 1.9 mg/dL (ref 1.7–2.4)

## 2024-02-16 MED ORDER — DOXYCYCLINE HYCLATE 100 MG PO TABS
100.0000 mg | ORAL_TABLET | Freq: Two times a day (BID) | ORAL | 0 refills | Status: AC
  Filled 2024-02-16: qty 4, 2d supply, fill #0

## 2024-02-16 MED ORDER — AMOXICILLIN-POT CLAVULANATE 875-125 MG PO TABS
1.0000 | ORAL_TABLET | Freq: Two times a day (BID) | ORAL | 0 refills | Status: AC
  Filled 2024-02-16: qty 4, 2d supply, fill #0

## 2024-02-16 NOTE — Progress Notes (Signed)
 Discharge medications delivered to patient at the bedside.

## 2024-02-16 NOTE — TOC Transition Note (Signed)
 Transition of Care [facility]) - Discharge Note   Patient Details  Name: Tammy Mullins MRN: [id number] Date of Birth: October 05, 1950  Transition of Care [facility]) CM/SW Contact:  Sonda Manuella Quill, RN Phone Number: 02/16/2024, 1:16 PM   Clinical Narrative:    Orders received for HHPT; spoke w/ pt in room; pt said she lives at home; she plans to return w/ family support; she identified POC niece [name] [phone]); family will provide transportation; pt verified insurance/PCP; she denied SDOH risks; pt has cane and walker; she does not have HH services or home oxygen; she agreed to receive recc HHPT; pt said she does not have agency preference; referral sent via hub; accepted by [facility] at [facility]; pt notified and agreed to d/c plan; agency contact info placed in follow up provider section of d/c instructions; no IP CM needs.  Final next level of care: Home w Home Health Services Barriers to Discharge: No Barriers Identified   Patient Goals and CMS Choice Patient states their goals for this hospitalization and ongoing recovery are:: home     [place] ownership interest in [facility].provided to:: Patient    Discharge Placement                       Discharge Plan and Services Additional resources added to the After Visit Summary for     Discharge Planning Services: CM Consult            DME Arranged: N/A DME Agency: NA       HH Arranged: PT HH Agency: [facility] Date HH Agency Contacted: 02/16/24 Time HH Agency Contacted: 1252 Representative spoke with at [facility] Agency: [facility]  Social Drivers of Health (SDOH) Interventions SDOH Screenings   Food Insecurity: No Food Insecurity (02/16/2024)  Housing: Low Risk (02/16/2024)  Transportation Needs: No Transportation Needs (02/16/2024)  Utilities: Not At Risk (02/16/2024)  Alcohol Screen: Low Risk (07/11/2022)  Physical Activity: Inactive (07/11/2022)  Social Connections: Unknown  (02/15/2024)  Tobacco Use: Low Risk (02/13/2024)     Readmission Risk Interventions    02/16/2024   12:13 PM 01/16/2024    1:14 PM  Readmission Risk Prevention Plan  Transportation Screening Complete Complete  PCP or Specialist Appt within 5-7 Days Complete Complete  Home Care Screening Complete Complete  Medication Review (RN CM) Complete Complete

## 2024-02-16 NOTE — Progress Notes (Signed)
 AVS reviewed with patient at the bedside. All questions answered, and patient verbalized understanding. IV removed per order without complications. Patient to be discharge home via vehicle. Patient escorted to main entrance via WC by staff.

## 2024-02-16 NOTE — Plan of Care (Signed)

## 2024-02-16 NOTE — Discharge Summary (Signed)
 " Physician Discharge Summary   Tammy Mullins FMW:[id number] DOB: [birth year] DOA: 02/13/2024  PCP: Wonda Worth SQUIBB, PA  Admit date: 02/13/2024 Discharge date: 02/16/2024  Admitted From: Home Disposition:  Home Discharging physician: Alm Apo, MD Barriers to discharge: none   Discharge Condition: stable CODE STATUS: Full  Diet recommendation:  Diet Orders (From admission, onward)     Start     Ordered   02/16/24 0000  Diet general        02/16/24 1011   02/14/24 1127  Diet regular Room service appropriate? Yes; Fluid consistency: Thin  Diet effective now       Question Answer Comment  Room service appropriate? Yes   Fluid consistency: Thin      02/14/24 1127            Hospital Course: Tammy Mullins is a 73 y.o. female with past medical history significant of fibromyalgia, irritable bowel syndrome, hypertension, hyperlipidemia, diabetes mellitus type 2, bipolar disorder and depression presented to hospital with tachycardia and was found to have first-degree AV block due to multifocal pneumonia.  She had gone to her PCP when EKG was done and was sent to the hospital for further evaluation.  In the ED patient was tachycardic and hypotensive without any hypoxia.  Labs were notable for hypokalemia with potassium of 3, BNP of 112, troponin 63-->75, CK 492, D-dimer 2.55, and lactic acid 2.0-->1.2. CTA chest PE protocol neg for acute PE, but showed mild bilateral perihilar upper lobe and right lower lobe ground-glass pulmonary infiltrate.  Urinalysis was negative for infection.  Patient was then admitted hospital for further evaluation and treatment  CAP - s/p Rocephin  and azithromycin  during hospitalization - Transitioned to Augmentin  and doxycycline to complete course at discharge  Tachycardia  First degree AV block - stable on tele - resumed back on home Toprol    Hypokalemia - Repleted  HTN Continue amlodipine    Bipolar disorder Depression  Patient was on  Cymbalta  Neurontin  and Seroquel  as outpatient.   Constipation Continue MiraLAX  Senokot and Depakote      The patient's acute and chronic medical conditions were treated accordingly. On day of discharge, patient was felt deemed stable for discharge. Patient/family member advised to call PCP or come back to ER if needed.   Principal Diagnosis: Sepsis due to pneumonia [facility])  Discharge Diagnoses: Active Hospital Problems   Diagnosis Date Noted   Hypokalemia 02/16/2024    Resolved Hospital Problems   Diagnosis Date Noted Date Resolved   Sepsis due to pneumonia [facility]) 02/14/2024 02/16/2024    Priority: 1.   Tachycardia 02/16/2024 02/16/2024     Discharge Instructions     Diet general   Complete by: As directed    Increase activity slowly   Complete by: As directed    No wound care   Complete by: As directed       Allergies as of 02/16/2024       Reactions   Crestor  [rosuvastatin  Calcium ] Other (See Comments)   Causes severe body aches   Dicyclomine  Other (See Comments)   Body and face rash, swelling.   Topiramate  Diarrhea   Dorethia Piety ] Other (See Comments)   GI upset in hight doses can take a 81mg    Celebrex [celecoxib] Swelling, Rash   Swelling in legs        Medication List     TAKE these medications    allopurinol  300 MG tablet Commonly known as: ZYLOPRIM  TAKE 1 AND 1/2 TABLETS(450 MG) BY MOUTH DAILY  amLODipine  10 MG tablet Commonly known as: NORVASC  Take 1 tablet (10 mg total) by mouth daily. TAKE 1 TABLET DAILY   amoxicillin -clavulanate 875-125 MG tablet Commonly known as: AUGMENTIN  Take 1 tablet by mouth 2 (two) times daily for 2 days. Start taking on: February 17, 2024   buPROPion  150 MG 24 hr tablet Commonly known as: WELLBUTRIN  XL Take 3 tablets (450 mg total) by mouth daily.   doxycycline 100 MG tablet Commonly known as: VIBRA-TABS Take 1 tablet (100 mg total) by mouth 2 (two) times daily for 2 days. Start taking on: February 17, 2024   DULoxetine  60 MG capsule Commonly known as: CYMBALTA  Take 2 capsules (120 mg total) by mouth daily.   famotidine 40 MG tablet Commonly known as: PEPCID Take 40 mg by mouth daily.   ferrous sulfate  325 (65 FE) MG tablet Take 325 mg by mouth daily with breakfast.   gabapentin  600 MG tablet Commonly known as: NEURONTIN  Take 0.5 tablets (300 mg total) by mouth 2 (two) times daily. What changed:  how much to take when to take this   hyoscyamine  0.125 MG SL tablet Commonly known as: LEVSIN  SL SMARTSIG:1-2 Tablet(s) Sublingual Every 4-6 Hours PRN   losartan  100 MG tablet Commonly known as: COZAAR  Take 100 mg by mouth daily.   methocarbamol  500 MG tablet Commonly known as: ROBAXIN  Take 1 tablet (500 mg total) by mouth every 6 (six) hours as needed for muscle spasms. What changed: when to take this   metoprolol  200 MG 24 hr tablet Commonly known as: TOPROL -XL Take 200 mg by mouth daily.   omeprazole  40 MG capsule Commonly known as: PRILOSEC Take 1 capsule (40 mg total) by mouth in the morning and at bedtime.   onetouch ultrasoft lancets Use as instructed   OneTouch Verio Flex System w/Device Kit Use as directed   OneTouch Verio test strip Generic drug: glucose blood Use as instructed   oxyCODONE  5 MG immediate release tablet Commonly known as: Oxy IR/ROXICODONE  Take 1 tablet (5 mg total) by mouth every 6 (six) hours as needed for moderate pain (pain score 4-6).   polyethylene glycol 17 g packet Commonly known as: MIRALAX  / GLYCOLAX  Take 17 g by mouth 2 (two) times daily. What changed:  when to take this reasons to take this   QUEtiapine  300 MG tablet Commonly known as: SEROQUEL  Take 2 tablets (600 mg total) by mouth at bedtime.   senna-docusate 8.6-50 MG tablet Commonly known as: Senokot-S Take 2 tablets by mouth 2 (two) times daily.        Allergies[1]  Consultations:   Procedures:   Discharge Exam: BP (!) 147/77   Pulse 75   Temp  98.2 F (36.8 C) (Oral)   Resp 18   Ht 5' 5 (1.651 m)   Wt 77.1 kg   SpO2 97%   BMI 28.29 kg/m  Physical Exam Constitutional:      Appearance: Normal appearance.  HENT:     Head: Normocephalic and atraumatic.     Mouth/Throat:     Mouth: Mucous membranes are moist.  Eyes:     Extraocular Movements: Extraocular movements intact.  Cardiovascular:     Rate and Rhythm: Normal rate and regular rhythm.  Pulmonary:     Effort: Pulmonary effort is normal. No respiratory distress.     Breath sounds: Normal breath sounds. No wheezing.  Abdominal:     General: Bowel sounds are normal. There is no distension.     Palpations: Abdomen  is soft.     Tenderness: There is no abdominal tenderness.  Musculoskeletal:        General: Normal range of motion.     Cervical back: Normal range of motion and neck supple.  Skin:    General: Skin is warm and dry.  Neurological:     General: No focal deficit present.     Mental Status: She is alert.  Psychiatric:        Mood and Affect: Mood normal.      The results of significant diagnostics from this hospitalization (including imaging, microbiology, ancillary and laboratory) are listed below for reference.   Microbiology: Recent Results (from the past 240 hours)  Resp panel by RT-PCR (RSV, Flu A&B, Covid) Anterior Nasal Swab     Status: None   Collection Time: 02/14/24  8:15 AM   Specimen: Anterior Nasal Swab  Result Value Ref Range Status   SARS Coronavirus 2 by RT PCR NEGATIVE NEGATIVE Final   Influenza A by PCR NEGATIVE NEGATIVE Final   Influenza B by PCR NEGATIVE NEGATIVE Final    Comment: (NOTE) The Xpert Xpress SARS-CoV-2/FLU/RSV plus assay is intended as an aid in the diagnosis of influenza from Nasopharyngeal swab specimens and should not be used as a sole basis for treatment. Nasal washings and aspirates are unacceptable for Xpert Xpress SARS-CoV-2/FLU/RSV testing.  Fact Sheet for  Patients: bloggercourse.com  Fact Sheet for Healthcare Providers: seriousbroker.it  This test is not yet approved or cleared by the [place]  FDA and has been authorized for detection and/or diagnosis of SARS-CoV-2 by FDA under an Emergency Use Authorization (EUA). This EUA will remain in effect (meaning this test can be used) for the duration of the COVID-19 declaration under Section 564(b)(1) of the Act, 21 U.S.C. section 360bbb-3(b)(1), unless the authorization is terminated or revoked.     Resp Syncytial Virus by PCR NEGATIVE NEGATIVE Final    Comment: (NOTE) Fact Sheet for Patients: bloggercourse.com  Fact Sheet for Healthcare Providers: seriousbroker.it  This test is not yet approved or cleared by the [place]  FDA and has been authorized for detection and/or diagnosis of SARS-CoV-2 by FDA under an Emergency Use Authorization (EUA). This EUA will remain in effect (meaning this test can be used) for the duration of the COVID-19 declaration under Section 564(b)(1) of the Act, 21 U.S.C. section 360bbb-3(b)(1), unless the authorization is terminated or revoked.  Performed at [facility], [address], [place], [place] [address]   Blood Culture (routine x 2)     Status: None (Preliminary result)   Collection Time: 02/14/24  8:57 AM   Specimen: BLOOD RIGHT ARM  Result Value Ref Range Status   Specimen Description BLOOD RIGHT ARM  Final   Special Requests   Final    BOTTLES DRAWN AEROBIC AND ANAEROBIC Blood Culture adequate volume   Culture   Final    NO GROWTH 2 DAYS Performed at [facility], [address] [place], [place] [address]    Report Status PENDING  Incomplete  Blood Culture (routine x 2)     Status: None (Preliminary result)   Collection Time: 02/15/24  2:06 PM   Specimen: BLOOD LEFT ARM  Result Value Ref Range Status   Specimen  Description   Final    BLOOD LEFT ARM Performed at [facility], [address], [place], [place] [address]    Special Requests   Final    BOTTLES DRAWN AEROBIC AND ANAEROBIC Blood  Culture adequate volume Performed at [facility], [address], [place], [place] [address]    Culture   Final    NO GROWTH < 24 HOURS Performed at [facility], [address], [place], [place] [address]    Report Status PENDING  Incomplete     Labs: BNP (last 3 results) No results for input(s): BNP in the last 8760 hours. Basic Metabolic Panel: Recent Labs  Lab 02/13/24 1853 02/14/24 0857 02/14/24 1206 02/15/24 0220 02/16/24 0602  NA 140 142  --  139 142  K 3.0* 3.2*  --  4.0 3.8  CL 102 102  --  106 107  CO2 25 26  --  25 28  GLUCOSE 110* 130*  --  103* 100*  BUN 10 12  --  9 6*  CREATININE 0.99 0.99 0.91 0.73 0.82  CALCIUM  10.3 10.9*  --  9.6 9.7  MG  --   --   --  1.7 1.9   Liver Function Tests: Recent Labs  Lab 02/14/24 0857  AST 29  ALT 20  ALKPHOS 108  BILITOT 0.4  PROT 7.6  ALBUMIN 4.6   No results for input(s): LIPASE, AMYLASE in the last 168 hours. No results for input(s): AMMONIA in the last 168 hours. CBC: Recent Labs  Lab 02/13/24 1853 02/14/24 1206 02/15/24 0220 02/16/24 0602  WBC 6.6 6.9 6.0 5.7  HGB 11.1* 10.8* 9.8* 9.4*  HCT 34.8* 34.0* 30.8* 29.9*  MCV 99.7 98.8 100.7* 100.0  PLT 291 262 214 226   Cardiac Enzymes: Recent Labs  Lab 02/13/24 1853  CKTOTAL 492*   BNP: Invalid input(s): POCBNP CBG: No results for input(s): GLUCAP in the last 168 hours. D-Dimer Recent Labs    02/13/24 1853  DDIMER 2.55*   Hgb A1c No results for input(s): HGBA1C in the last 72 hours. Lipid Profile No results for input(s): CHOL, HDL, LDLCALC, TRIG, CHOLHDL, LDLDIRECT in the last 72 hours. Thyroid  function studies No results for input(s): TSH, T4TOTAL, T3FREE, THYROIDAB in the last 72  hours.  Invalid input(s): FREET3 Anemia work up No results for input(s): VITAMINB12, FOLATE, FERRITIN, TIBC, IRON, RETICCTPCT in the last 72 hours. Urinalysis    Component Value Date/Time   COLORURINE YELLOW 02/14/2024 1944   APPEARANCEUR CLEAR 02/14/2024 1944   LABSPEC 1.019 02/14/2024 1944   PHURINE 5.0 02/14/2024 1944   GLUCOSEU NEGATIVE 02/14/2024 1944   HGBUR NEGATIVE 02/14/2024 1944   BILIRUBINUR NEGATIVE 02/14/2024 1944   KETONESUR 20 (A) 02/14/2024 1944   PROTEINUR NEGATIVE 02/14/2024 1944   NITRITE NEGATIVE 02/14/2024 1944   LEUKOCYTESUR NEGATIVE 02/14/2024 1944   Sepsis Labs Recent Labs  Lab 02/13/24 1853 02/14/24 1206 02/15/24 0220 02/16/24 0602  WBC 6.6 6.9 6.0 5.7   Microbiology Recent Results (from the past 240 hours)  Resp panel by RT-PCR (RSV, Flu A&B, Covid) Anterior Nasal Swab     Status: None   Collection Time: 02/14/24  8:15 AM   Specimen: Anterior Nasal Swab  Result Value Ref Range Status   SARS Coronavirus 2 by RT PCR NEGATIVE NEGATIVE Final   Influenza A by PCR NEGATIVE NEGATIVE Final   Influenza B by PCR NEGATIVE NEGATIVE Final    Comment: (NOTE) The Xpert Xpress SARS-CoV-2/FLU/RSV plus assay is intended as an aid in the diagnosis of influenza from Nasopharyngeal swab specimens and should not be used as a sole basis for treatment. Nasal washings and aspirates are unacceptable for Xpert Xpress SARS-CoV-2/FLU/RSV testing.  Fact Sheet  for Patients: bloggercourse.com  Fact Sheet for Healthcare Providers: seriousbroker.it  This test is not yet approved or cleared by the [place]  FDA and has been authorized for detection and/or diagnosis of SARS-CoV-2 by FDA under an Emergency Use Authorization (EUA). This EUA will remain in effect (meaning this test can be used) for the duration of the COVID-19 declaration under Section 564(b)(1) of the Act, 21 U.S.C. section 360bbb-3(b)(1),  unless the authorization is terminated or revoked.     Resp Syncytial Virus by PCR NEGATIVE NEGATIVE Final    Comment: (NOTE) Fact Sheet for Patients: bloggercourse.com  Fact Sheet for Healthcare Providers: seriousbroker.it  This test is not yet approved or cleared by the [place]  FDA and has been authorized for detection and/or diagnosis of SARS-CoV-2 by FDA under an Emergency Use Authorization (EUA). This EUA will remain in effect (meaning this test can be used) for the duration of the COVID-19 declaration under Section 564(b)(1) of the Act, 21 U.S.C. section 360bbb-3(b)(1), unless the authorization is terminated or revoked.  Performed at [facility], [address], [place], [place] [address]   Blood Culture (routine x 2)     Status: None (Preliminary result)   Collection Time: 02/14/24  8:57 AM   Specimen: BLOOD RIGHT ARM  Result Value Ref Range Status   Specimen Description BLOOD RIGHT ARM  Final   Special Requests   Final    BOTTLES DRAWN AEROBIC AND ANAEROBIC Blood Culture adequate volume   Culture   Final    NO GROWTH 2 DAYS Performed at [facility], [address] [place], [place] [address]    Report Status PENDING  Incomplete  Blood Culture (routine x 2)     Status: None (Preliminary result)   Collection Time: 02/15/24  2:06 PM   Specimen: BLOOD LEFT ARM  Result Value Ref Range Status   Specimen Description   Final    BLOOD LEFT ARM Performed at [facility] [place], [address] [place], [place] [address]    Special Requests   Final    BOTTLES DRAWN AEROBIC AND ANAEROBIC Blood Culture adequate volume Performed at [facility], [address], [place], [place] [address]    Culture   Final    NO GROWTH < 24 HOURS Performed at [facility], [address], [place], [place] [address]    Report Status PENDING  Incomplete    Procedures/Studies: DG Chest 1  View Result Date: 02/14/2024 CLINICAL DATA:  Sepsis EXAM: CHEST  1 VIEW COMPARISON:  January 15, 2024 FINDINGS: The heart size and mediastinal contours are within normal limits. Both lungs are clear. The visualized skeletal structures are unremarkable. IMPRESSION: No active disease. Electronically Signed   By: Lynwood Landy Raddle M.D.   On: 02/14/2024 08:57   CT Angio Chest PE W and/or Wo Contrast Result Date: 02/13/2024 EXAM: CTA CHEST 02/13/2024 10:27:50 PM TECHNIQUE: CTA of the chest was performed without and with the administration of 75 mL of intravenous contrast (iohexol  (OMNIPAQUE ) 350 MG/ML injection). Multiplanar reformatted images are provided for review. MIP images are provided for review. Automated exposure control, iterative reconstruction, and/or weight based adjustment of the mA/kV was utilized to reduce the radiation dose to as low as reasonably achievable. COMPARISON: Prior examination of 12/12/2022. CLINICAL HISTORY: Pulmonary embolism (PE) suspected, low to intermediate prob, positive D-dimer. FINDINGS: PULMONARY ARTERIES: Pulmonary arteries are adequately opacified for evaluation. Central pulmonary arteries are of normal caliber. No acute pulmonary embolus.  MEDIASTINUM: No significant coronary artery calcification. Global cardiac size within normal limits. A small pericardial effusion is present, which is new from prior examination of 12/12/2022. Mild atherosclerotic calcification within the thoracic aorta. No aortic aneurysm. The esophagus is patulous, which may reflect changes of esophageal dysmotility or gastroesophageal reflux. LYMPH NODES: No mediastinal, hilar or axillary lymphadenopathy. LUNGS AND PLEURA: There is mild bilateral perihilar upper lobe and right lower lobe ground-glass pulmonary infiltrate, likely infectious or inflammatory in the acute setting. No evidence of pleural effusion or pneumothorax. UPPER ABDOMEN: Limited images of the upper abdomen are unremarkable. SOFT  TISSUES AND BONES: Osseous structures are age appropriate. No acute bone abnormality. No lytic or blastic bone lesion. No acute soft tissue abnormality. IMPRESSION: 1. No pulmonary embolism. 2. Mild bilateral perihilar upper lobe and right lower lobe ground-glass pulmonary infiltrate, likely infectious or inflammatory in the acute setting. 3. Small pericardial effusion, new from prior examination of 12/12/2022. 4. Mild thoracic aortic atherosclerosis. Electronically signed by: Dorethia Molt MD 02/13/2024 11:09 PM EST RP Workstation: [id number]   VAS US  LOWER EXTREMITY VENOUS (DVT) Result Date: 01/22/2024  Lower Venous DVT Study Patient Name:  Tammy Mullins  Date of Exam:   01/22/2024 Medical Rec #: [id number]       Accession #:    [id number] Date of Birth: 1950/06/02       Patient Gender: F Patient Age:   15 years Exam Location:  [facility] Procedure:      VAS US  LOWER EXTREMITY VENOUS (DVT) Referring Phys: JOETTE PEBBLES --------------------------------------------------------------------------------  Indications: Pain, Swelling, and Status post fall. Patient on floor overnight, rhabdomyolysis.  Limitations: Significant tight swelling, bandages covering blisters on legs and poor ultrasound/tissue interface. Comparison Study: No prior study on file Performing Technologist: Alberta Lis RVS  Examination Guidelines: A complete evaluation includes B-mode imaging, spectral Doppler, color Doppler, and power Doppler as needed of all accessible portions of each vessel. Bilateral testing is considered an integral part of a complete examination. Limited examinations for reoccurring indications may be performed as noted. The reflux portion of the exam is performed with the patient in reverse Trendelenburg.  +-----+---------------+---------+-----------+----------+--------------+ RIGHTCompressibilityPhasicitySpontaneityPropertiesThrombus Aging  +-----+---------------+---------+-----------+----------+--------------+ CFV  Full                                                        +-----+---------------+---------+-----------+----------+--------------+ SFJ  Full                                                        +-----+---------------+---------+-----------+----------+--------------+   +---------+---------------+---------+-----------+----------+-------------------+ LEFT     CompressibilityPhasicitySpontaneityPropertiesThrombus Aging      +---------+---------------+---------+-----------+----------+-------------------+ CFV      Full           Yes      Yes                                      +---------+---------------+---------+-----------+----------+-------------------+ SFJ      Full                                                             +---------+---------------+---------+-----------+----------+-------------------+  FV Prox  Full           Yes      No                                       +---------+---------------+---------+-----------+----------+-------------------+ FV Mid   Full           Yes      No                                       +---------+---------------+---------+-----------+----------+-------------------+ FV Distal               Yes      No                   patent by color and                                                       Doppler             +---------+---------------+---------+-----------+----------+-------------------+ PFV                                                   patent by color     +---------+---------------+---------+-----------+----------+-------------------+ POP                     Yes      No                   patent by color and                                                       Doppler             +---------+---------------+---------+-----------+----------+-------------------+ PTV                                                    patent by color     +---------+---------------+---------+-----------+----------+-------------------+ PERO                                                  Not well visualized +---------+---------------+---------+-----------+----------+-------------------+     Summary: RIGHT: - No evidence of common femoral vein obstruction.   LEFT: - There is no evidence of deep vein thrombosis in the lower extremity. However, portions of this examination were limited- see technologist comments above.  - Ultrasound characteristics of enlarged lymph nodes noted in the groin.  *See table(s) above for measurements and observations. Electronically signed by Lonni Gaskins MD on 01/22/2024 at 1:41:13 PM.  Final    MR LUMBAR SPINE WO CONTRAST Result Date: 01/17/2024 EXAM: MRI LUMBAR SPINE 01/17/2024 03:25:00 PM TECHNIQUE: Multiplanar multisequence MRI of the lumbar spine was performed without the administration of intravenous contrast. COMPARISON: MR Lumbar Spine Without IV Contrast 01/24/2018. CT lumbar spine 03/03/2022. CLINICAL HISTORY: Low back pain, trauma. FINDINGS: BONES AND ALIGNMENT: Posterior instrumented fusion spanning L3 to S1 with bilateral pedicle screws and vertical interconnecting rods. Interbody spacers are noted at the L3-L4 through L5-S1 levels. Postsurgical changes are present within the midline paraspinal soft tissues of the lower lumbar spine. Lumbar lordosis is maintained. Normal vertebral body heights. Bone marrow signal is mildly heterogeneous. There is no bone marrow edema or evidence of acute fracture. Mild degenerative endplate changes are present at L2-L3. SPINAL CORD: The conus medullaris extends to the L2 level. SOFT TISSUES: Postsurgical changes are present within the midline paraspinal soft tissues of the lower lumbar spine. Mild atrophy of the paraspinal musculature. T12-L1: No significant spinal canal or foraminal stenosis. L1-L2: There is a small disc bulge and mild facet  arthrosis. No significant spinal canal or foraminal stenosis. L2-L3: There is mild disc height loss and disc desiccation. A diffuse disc bulge indents the ventral thecal sac and contributes to lateral recess narrowing. Moderate facet arthrosis with bilateral facet effusions. Thickening of the ligamentum flavum. Moderate spinal canal stenosis with crowding of the cauda equina roots. There is mild bilateral foraminal stenosis. The disc bulge and facet osteophytes possibly contact the exiting right L2 nerve root. L3-L4: Postsurgical changes are present. No significant residual spinal canal stenosis. Small right subarticular disc protrusion. No significant foraminal stenosis. L4-L5: Postsurgical changes are present. No significant residual spinal canal stenosis. No significant foraminal stenosis. L5-S1: Postsurgical changes are present. Small disc bulge. No significant spinal canal stenosis. There is moderate foraminal stenosis on the right. IMPRESSION: 1. No acute findings. 2. Posterior instrumented fusion spanning L3 to S1. No significant residual spinal canal stenosis at operative levels. 3. Moderate spinal canal stenosis at L2-3 with crowding of the cauda equina roots and mild bilateral foraminal stenosis. Disc bulge and facet osteophytes may contact the exiting right L2 nerve root. Findings suggestive of adjacent segment disease. 4. Additional degenerative changes as above. Moderate right foraminal stenosis at L5-S1. Electronically signed by: Donnice Mania MD 01/17/2024 04:33 PM EST RP Workstation: [id number]     Time coordinating discharge: Over 30 minutes    Alm Apo, MD  [facility] 02/16/2024, 12:25 PM    [1]  Allergies Allergen Reactions   Crestor  [Rosuvastatin  Calcium ] Other (See Comments)    Causes severe body aches   Dicyclomine  Other (See Comments)    Body and face rash, swelling.   Topiramate  Diarrhea   Dorethia Lords ] Other (See Comments)    GI upset in hight doses can take  a 81mg    Celebrex [Celecoxib] Swelling and Rash    Swelling in legs   "

## 2024-02-16 NOTE — Progress Notes (Signed)
 Mobility Specialist Progress Note:  RA   02/16/24 1226  Mobility  Activity Ambulated with assistance  Level of Assistance Contact guard assist, steadying assist  Assistive Device Front wheel walker  Distance Ambulated (ft) 260 ft  Activity Response Tolerated well  Mobility Referral Yes  Mobility visit 1 Mobility  Mobility Specialist Start Time (ACUTE ONLY) 1118  Mobility Specialist Stop Time (ACUTE ONLY) 1135  Mobility Specialist Time Calculation (min) (ACUTE ONLY) 17 min   Nurse requested Mobility Specialist to perform oxygen saturation test with pt which includes removing pt from oxygen both at rest and while ambulating.  Below are the results from that testing.     Patient Saturations on Room Air at Rest = spO2 94%  Patient Saturations on Room Air while Ambulating = sp02 92-93% .    Pt had no complaints/issues during session. Pt returned to bed on room air with all needs met. Call bell in reach.  Reported results to RN  [facility] - Mobility Specialist

## 2024-02-19 LAB — CULTURE, BLOOD (ROUTINE X 2)
Culture: NO GROWTH
Special Requests: ADEQUATE

## 2024-02-20 LAB — CULTURE, BLOOD (ROUTINE X 2)
Culture: NO GROWTH
Special Requests: ADEQUATE

## 2024-03-12 ENCOUNTER — Other Ambulatory Visit (HOSPITAL_BASED_OUTPATIENT_CLINIC_OR_DEPARTMENT_OTHER): Payer: Self-pay | Admitting: Physician Assistant

## 2024-03-12 DIAGNOSIS — Z1231 Encounter for screening mammogram for malignant neoplasm of breast: Secondary | ICD-10-CM

## 2024-03-12 NOTE — Progress Notes (Unsigned)
" °  Cardiology Office Note:  .   Date:  03/12/2024  ID:  Tammy Mullins, DOB 1950-08-11, MRN [id number] PCP: Tammy Worth SQUIBB, PA  [facility] Providers Cardiologist:  Tammy Scarce, MD {  History of Present Illness: .   Tammy Mullins is a 74 y.o. female with history of hypertension, diabetes, OSA, hyperlipidemia, and bipolar disorder      Hypertension 06/2022 referred to advanced hypertension clinic.  Reported every morning waking up to headaches.  Severely elevated readings as high as 250/200.  TSH, metanephrines, aldosterone and renin, renal artery duplex ultrasound was also ordered.  All studies were normal.  Had norepinephrine levels that were elevated but 24-hour urine collection did not show evidence of pheochromocytoma.   Syncope 12/2022 admission for traumatic rhabdo secondary to fall at home.  CK levels greater than 20,000.  Concern for syncope, echocardiogram with preserved LVEF with no valve disease noted.  30-day heart monitor ordered at discharge.  CAC 11/2022 CAC score 0  Social history      Patient with history of resistant hypertension extensive workup for secondary etiologies although without any remarkable findings.  Since then admitted 12/2022 for traumatic rhabdo, question of syncope during admission so 30-day heart monitor ordered, not completed.Tammy Mullins  She was admitted again 01/2024 for pneumonia.  Resistant hypertension TSH, metanephrines, aldosterone and renin, renal artery duplex ultrasound was also ordered.  All studies were normal.  Had norepinephrine levels that were elevated but 24-hour urine collection did not show evidence of pheochromocytoma.   Syncope Heart monitor never completed.  Echocardiogram unremarkable.    ROS: Denies: Chest pain, shortness of breath, orthopnea, peripheral edema, palpitations, syncope, decreased exercise capacity, fatigue, dizziness.   Studies Reviewed: .         Risk Assessment/Calculations:   {Does this patient  have ATRIAL FIBRILLATION?[phone]} No BP recorded.  {Refresh Note OR Click here to enter BP  :1}***       Physical Exam:   VS:  There were no vitals taken for this visit.   Wt Readings from Last 3 Encounters:  02/13/24 170 lb (77.1 kg)  01/21/24 183 lb 6.8 oz (83.2 kg)  12/12/23 181 lb 6.4 oz (82.3 kg)    GEN: Well nourished, well developed in no acute distress NECK: No JVD; No carotid bruits CARDIAC: ***RRR, no murmurs, rubs, gallops RESPIRATORY:  Clear to auscultation without rales, wheezing or rhonchi  ABDOMEN: Soft, non-tender, non-distended EXTREMITIES:  No edema; No deformity   ASSESSMENT AND PLAN: .         {Are you ordering a CV Procedure (e.g. stress test, cath, DCCV, TEE, etc)?   Press F2        :[id number]}  Dispo: ***  Signed, Tammy LITTIE Sluder, PA-C  "

## 2024-03-13 ENCOUNTER — Ambulatory Visit: Admitting: Cardiology

## 2024-04-08 ENCOUNTER — Inpatient Hospital Stay (HOSPITAL_BASED_OUTPATIENT_CLINIC_OR_DEPARTMENT_OTHER): Admission: RE | Admit: 2024-04-08 | Source: Ambulatory Visit

## 2024-04-08 ENCOUNTER — Other Ambulatory Visit (HOSPITAL_BASED_OUTPATIENT_CLINIC_OR_DEPARTMENT_OTHER)

## 2024-06-11 ENCOUNTER — Ambulatory Visit: Admitting: Rheumatology
